# Patient Record
Sex: Female | Born: 1945
Health system: Southern US, Community
[De-identification: ages and names within clinical notes are randomized; demographics above are authoritative.]

## PROBLEM LIST (undated history)

## (undated) DIAGNOSIS — G2581 Restless legs syndrome: Secondary | ICD-10-CM

## (undated) DIAGNOSIS — E663 Overweight: Secondary | ICD-10-CM

## (undated) DIAGNOSIS — A4102 Sepsis due to Methicillin resistant Staphylococcus aureus: Secondary | ICD-10-CM

## (undated) DIAGNOSIS — M797 Fibromyalgia: Secondary | ICD-10-CM

## (undated) DIAGNOSIS — R112 Nausea with vomiting, unspecified: Secondary | ICD-10-CM

## (undated) DIAGNOSIS — K219 Gastro-esophageal reflux disease without esophagitis: Secondary | ICD-10-CM

## (undated) DIAGNOSIS — G4733 Obstructive sleep apnea (adult) (pediatric): Secondary | ICD-10-CM

## (undated) DIAGNOSIS — C187 Malignant neoplasm of sigmoid colon: Secondary | ICD-10-CM

## (undated) DIAGNOSIS — K589 Irritable bowel syndrome without diarrhea: Secondary | ICD-10-CM

## (undated) DIAGNOSIS — E785 Hyperlipidemia, unspecified: Secondary | ICD-10-CM

## (undated) DIAGNOSIS — F32A Depression, unspecified: Secondary | ICD-10-CM

## (undated) DIAGNOSIS — I509 Heart failure, unspecified: Secondary | ICD-10-CM

## (undated) DIAGNOSIS — K859 Acute pancreatitis without necrosis or infection, unspecified: Secondary | ICD-10-CM

## (undated) DIAGNOSIS — J189 Pneumonia, unspecified organism: Secondary | ICD-10-CM

## (undated) DIAGNOSIS — F419 Anxiety disorder, unspecified: Secondary | ICD-10-CM

## (undated) DIAGNOSIS — K649 Unspecified hemorrhoids: Secondary | ICD-10-CM

## (undated) DIAGNOSIS — I10 Essential (primary) hypertension: Secondary | ICD-10-CM

## (undated) DIAGNOSIS — R000 Tachycardia, unspecified: Secondary | ICD-10-CM

## (undated) DIAGNOSIS — M1990 Unspecified osteoarthritis, unspecified site: Secondary | ICD-10-CM

## (undated) DIAGNOSIS — Z9889 Other specified postprocedural states: Secondary | ICD-10-CM

## (undated) DIAGNOSIS — F329 Major depressive disorder, single episode, unspecified: Secondary | ICD-10-CM

## (undated) HISTORY — PX: LEFT OOPHORECTOMY: SHX1961

## (undated) HISTORY — DX: Unspecified osteoarthritis, unspecified site: M19.90

## (undated) HISTORY — DX: Malignant neoplasm of sigmoid colon: C18.7

## (undated) HISTORY — DX: Fibromyalgia: M79.7

## (undated) HISTORY — DX: Depression, unspecified: F32.A

## (undated) HISTORY — DX: Acute pancreatitis without necrosis or infection, unspecified: K85.90

## (undated) HISTORY — DX: Restless legs syndrome: G25.81

## (undated) HISTORY — PX: APPENDECTOMY: SHX54

## (undated) HISTORY — DX: Irritable bowel syndrome, unspecified: K58.9

## (undated) HISTORY — DX: Gastro-esophageal reflux disease without esophagitis: K21.9

## (undated) HISTORY — DX: Essential (primary) hypertension: I10

## (undated) HISTORY — PX: CHOLECYSTECTOMY: SHX55

## (undated) HISTORY — DX: Tachycardia, unspecified: R00.0

## (undated) HISTORY — PX: OTHER SURGICAL HISTORY: SHX169

## (undated) HISTORY — DX: Hyperlipidemia, unspecified: E78.5

## (undated) HISTORY — PX: CERVICAL SPINE SURGERY: SHX589

## (undated) HISTORY — PX: ABDOMINAL HYSTERECTOMY: SHX81

## (undated) HISTORY — DX: Obstructive sleep apnea (adult) (pediatric): G47.33

## (undated) HISTORY — PX: TONSILLECTOMY: SUR1361

## (undated) HISTORY — DX: Overweight: E66.3

## (undated) HISTORY — DX: Sepsis due to methicillin resistant Staphylococcus aureus: A41.02

## (undated) HISTORY — DX: Major depressive disorder, single episode, unspecified: F32.9

## (undated) HISTORY — DX: Unspecified hemorrhoids: K64.9

## (undated) HISTORY — DX: Anxiety disorder, unspecified: F41.9

## (undated) HISTORY — PX: TUBAL LIGATION: SHX77

## (undated) HISTORY — PX: COLON SURGERY: SHX602

## (undated) HISTORY — PX: EYE SURGERY: SHX253

---

## 2010-06-02 HISTORY — PX: JOINT REPLACEMENT: SHX530

## 2010-12-06 ENCOUNTER — Ambulatory Visit: Payer: Self-pay | Admitting: Family Medicine

## 2010-12-24 ENCOUNTER — Other Ambulatory Visit: Payer: Self-pay | Admitting: Orthopedic Surgery

## 2010-12-24 ENCOUNTER — Other Ambulatory Visit (HOSPITAL_COMMUNITY): Payer: Self-pay

## 2010-12-24 ENCOUNTER — Encounter (HOSPITAL_COMMUNITY)
Admission: RE | Admit: 2010-12-24 | Discharge: 2010-12-24 | Disposition: A | Discharge status: Home / Self Care | Payer: Medicare Other | Source: Ambulatory Visit | Attending: Orthopedic Surgery | Admitting: Orthopedic Surgery

## 2010-12-24 LAB — CBC
Hemoglobin: 14.8 g/dL (ref 12.0–15.0)
MCH: 31.3 pg (ref 26.0–34.0)
MCHC: 34.7 g/dL (ref 30.0–36.0)

## 2010-12-24 LAB — DIFFERENTIAL
Basophils Relative: 0 % (ref 0–1)
Eosinophils Absolute: 0.1 10*3/uL (ref 0.0–0.7)
Monocytes Relative: 7 % (ref 3–12)
Neutrophils Relative %: 52 % (ref 43–77)

## 2010-12-24 LAB — COMPREHENSIVE METABOLIC PANEL
Alkaline Phosphatase: 97 U/L (ref 39–117)
BUN: 19 mg/dL (ref 6–23)
Chloride: 101 mEq/L (ref 96–112)
GFR calc Af Amer: 40 mL/min — ABNORMAL LOW (ref >60)
Glucose, Bld: 117 mg/dL — ABNORMAL HIGH (ref 70–99)
Potassium: 4.6 mEq/L (ref 3.5–5.1)
Total Bilirubin: 0.3 mg/dL (ref 0.3–1.2)

## 2010-12-24 LAB — URINALYSIS, ROUTINE W REFLEX MICROSCOPIC
Hgb urine dipstick: NEGATIVE
Specific Gravity, Urine: 1.022 (ref 1.005–1.030)
Urobilinogen, UA: 1 mg/dL (ref 0.0–1.0)
pH: 6 (ref 5.0–8.0)

## 2010-12-24 LAB — URINE MICROSCOPIC-ADD ON

## 2010-12-24 LAB — PROTIME-INR: INR: 0.93 (ref 0.00–1.49)

## 2010-12-24 LAB — SURGICAL PCR SCREEN: MRSA, PCR: NEGATIVE

## 2010-12-25 LAB — URINE CULTURE

## 2010-12-30 ENCOUNTER — Inpatient Hospital Stay (HOSPITAL_COMMUNITY)
Admission: RE | Admit: 2010-12-30 | Discharge: 2011-01-01 | DRG: 470 | Disposition: A | Discharge status: Home Health | Payer: Medicare Other | Source: Ambulatory Visit | Attending: Orthopedic Surgery | Admitting: Orthopedic Surgery

## 2010-12-30 DIAGNOSIS — D6859 Other primary thrombophilia: Secondary | ICD-10-CM | POA: Diagnosis present

## 2010-12-30 DIAGNOSIS — K219 Gastro-esophageal reflux disease without esophagitis: Secondary | ICD-10-CM | POA: Diagnosis present

## 2010-12-30 DIAGNOSIS — F3289 Other specified depressive episodes: Secondary | ICD-10-CM | POA: Diagnosis present

## 2010-12-30 DIAGNOSIS — Z981 Arthrodesis status: Secondary | ICD-10-CM

## 2010-12-30 DIAGNOSIS — Z01812 Encounter for preprocedural laboratory examination: Secondary | ICD-10-CM

## 2010-12-30 DIAGNOSIS — I10 Essential (primary) hypertension: Secondary | ICD-10-CM | POA: Diagnosis present

## 2010-12-30 DIAGNOSIS — Z7982 Long term (current) use of aspirin: Secondary | ICD-10-CM

## 2010-12-30 DIAGNOSIS — M1710 Unilateral primary osteoarthritis, unspecified knee: Principal | ICD-10-CM | POA: Diagnosis present

## 2010-12-30 DIAGNOSIS — IMO0001 Reserved for inherently not codable concepts without codable children: Secondary | ICD-10-CM | POA: Diagnosis present

## 2010-12-30 DIAGNOSIS — D62 Acute posthemorrhagic anemia: Secondary | ICD-10-CM | POA: Diagnosis not present

## 2010-12-30 DIAGNOSIS — F411 Generalized anxiety disorder: Secondary | ICD-10-CM | POA: Diagnosis present

## 2010-12-30 DIAGNOSIS — F329 Major depressive disorder, single episode, unspecified: Secondary | ICD-10-CM | POA: Diagnosis present

## 2010-12-30 LAB — ABO/RH: ABO/RH(D): O POS

## 2010-12-30 LAB — TYPE AND SCREEN: ABO/RH(D): O POS

## 2010-12-31 LAB — BASIC METABOLIC PANEL
CO2: 26 mEq/L (ref 19–32)
Calcium: 8.9 mg/dL (ref 8.4–10.5)
Creatinine, Ser: 0.83 mg/dL (ref 0.50–1.10)
GFR calc Af Amer: >60 mL/min (ref >60)

## 2010-12-31 LAB — PROTIME-INR
INR: 1.06 (ref 0.00–1.49)
Prothrombin Time: 14 seconds (ref 11.6–15.2)

## 2010-12-31 LAB — CBC
MCHC: 34.1 g/dL (ref 30.0–36.0)
RDW: 13 % (ref 11.5–15.5)

## 2011-01-01 LAB — BASIC METABOLIC PANEL
BUN: 14 mg/dL (ref 6–23)
Calcium: 8.8 mg/dL (ref 8.4–10.5)
Creatinine, Ser: 0.84 mg/dL (ref 0.50–1.10)
GFR calc non Af Amer: >60 mL/min (ref >60)
Glucose, Bld: 103 mg/dL — ABNORMAL HIGH (ref 70–99)
Sodium: 138 mEq/L (ref 135–145)

## 2011-01-01 LAB — CBC
MCHC: 33.4 g/dL (ref 30.0–36.0)
MCV: 92.4 fL (ref 78.0–100.0)
Platelets: 227 10*3/uL (ref 150–400)
RDW: 13.4 % (ref 11.5–15.5)
WBC: 10.6 10*3/uL — ABNORMAL HIGH (ref 4.0–10.5)

## 2011-01-12 NOTE — Op Note (Signed)
Angela [name], Angela [name]                 ACCOUNT NO.:  [id number]  MEDICAL RECORD NO.:  [id number]  LOCATION:  2550                         FACILITY:  [facility]  PHYSICIAN:  Fallen Crisostomo A. Thurston Hole, M.D. DATE OF BIRTH:  Oct 06, 1945  DATE OF PROCEDURE:  12/30/2010 DATE OF DISCHARGE:                              OPERATIVE REPORT   PREOPERATIVE DIAGNOSIS:  Right knee degenerative joint disease.  POSTOPERATIVE DIAGNOSIS:  Right knee degenerative joint disease.  PROCEDURE: 1. Right total knee replacement using DePuy cemented total knee system     with number 2.5 cemented femur, #3 cemented tibia with 12.5 mm     polyethylene RP tibial spacer and 32 mm polyethylene cemented     patella. 2. Tobramycin impregnated cement.  SURGEON:  Elana Alm. Thurston Hole, MD  ASSISTANT:  Julien Girt, PA-C  ANESTHESIA:  General.  OPERATIVE TIME:  1 hour and 20 minutes.  COMPLICATIONS:  None.  DESCRIPTION OF PROCEDURE:  Angela [name] was brought to the operating roomon December 30, 2010, after a femoral nerve block was placed in holding area by Anesthesia.  She was placed on operative table in supine position. She received vancomycin 1 g IV preoperatively for prophylaxis.  After being placed under general anesthesia, her right knee was examined.  She had range of motion from -5 to 125 degrees, mild varus deformity, knee stable, ligamentous exam with normal patellar tracking.  She had a Foley catheter placed under sterile conditions.  Her right leg was prepped using sterile DuraPrep and draped using sterile technique.  Time-out procedure was called and correct right knee identified.  The right leg was exsanguinated and a thigh tourniquet elevated at 365 mm.  Initially, through a 15-cm longitudinal incision based over the patella initial exposure was made.  The underlying subcutaneous tissues were incised along with skin incision.  A median arthrotomy was performed revealing an excessive amount of normal-appearing joint  fluid.  The articular surfaces were inspected.  She had grade 4 changes medially, grade 3 and 4 changes laterally and in the patellofemoral joint.  Osteophytes were removed from the femoral condyles and tibial plateau.  The medial lateral meniscal remnants were removed as well as the anterior cruciate ligament.  Intramedullary drill was then drilled up the femoral canal for placement of distal femoral cutting jig, which was placed in the appropriate manner rotation and a distal 10-mm cut was made.  The distal femur was incised.  A #2.5 was found to be the appropriate size.  #2.5 cutting jig was placed in appropriate manner external rotation and then these cuts were made.  The proximal tibia was exposed.  The tibial spines were removed with an oscillating saw.  Intramedullary drill was drilled down the tibial canal for placement of proximal tibial cutting jig which was placed in appropriate manner rotation and a proximal 6-mm cut was made based off the medial or lower side.  Spacer blocks were then placed in flexion and extension.  A 12.5 mm blocks gave excellent balancing, excellent stability and excellent correction of her flexion and varus deformities.  A #3 tibial baseplate trial was then placed on the cut tibial surface with an excellent fit and the keel  cut was made. The PCL box cutter was then placed on the distal femur and these cuts were made.  At this point, the #2.5 femoral trial was placed and with a #3 tibial baseplate trial and then a 12.5-mm polyethylene RP tibial spacer, knee was reduced, taken through range of motion from 0-125 degrees with excellent stability and excellent correction of her flexion and varus deformities and normal patellar tracking.  A resurfacing 8.5- mm cut was then made on the patella and three locking holes placed for a 32-mm polyethylene patella trial and again patellofemoral tracking was evaluated and found to be normal.  At this point, felt that all  trial components were of excellent size and fit stability.  They were then removed.  The knee was then jet lavage irrigated with 3 liters of saline.  The proximal tibia was then exposed.  #3 tibial baseplate with tobramycin impregnated cement was then hammered into position with an excellent fit with excess cement being removed from around the edges.  A #2.5 femoral component with cement backing was hammered into position also with an excellent fit with excess cement being removed from around the edges.  A 12.5-mm polyethylene RP tibial spacer was placed on tibial baseplate.  The knee reduced, taken through range of motion from 0-125 degrees with excellent stability and excellent correction of her flexion and varus deformities.  The 32-mm polyethylene cement backed patella was then placed in its position and held there with a clamp.  After the cement hardened again patellofemoral tracking was evaluated and found to be normal.  At this point, felt that all components were of excellent size and fit stability.  The wound was further irrigated with saline. Tourniquet was released.  Hemostasis obtained with cautery.  The arthrotomy was then closed with #1 Ethilon suture over two medium Hemovac drains.  Subcutaneous tissues closed with 0 and 2-0 Vicryl subcuticular layer closed with 4-0 Monocryl.  Sterile dressings were applied and a long-leg splint and the patient awakened, extubated and taken to recovery room in stable condition.  Needle, sponge counts correct x2 at the end the case.  Neurovascular status normal.  Pulses 2+ and symmetric.     Claborn Janusz A. Thurston Hole, M.D.     RAW/MEDQ  D:  12/30/2010  T:  12/30/2010  Job:  [id number]  Electronically Signed by Salvatore Marvel M.D. on 01/12/2011 02:59:07 PM

## 2011-06-11 DIAGNOSIS — IMO0001 Reserved for inherently not codable concepts without codable children: Secondary | ICD-10-CM | POA: Diagnosis not present

## 2011-06-11 DIAGNOSIS — G2581 Restless legs syndrome: Secondary | ICD-10-CM | POA: Diagnosis not present

## 2011-06-11 DIAGNOSIS — J309 Allergic rhinitis, unspecified: Secondary | ICD-10-CM | POA: Diagnosis not present

## 2011-06-11 DIAGNOSIS — Z79899 Other long term (current) drug therapy: Secondary | ICD-10-CM | POA: Diagnosis not present

## 2011-07-10 DIAGNOSIS — H04129 Dry eye syndrome of unspecified lacrimal gland: Secondary | ICD-10-CM | POA: Diagnosis not present

## 2011-07-15 DIAGNOSIS — N952 Postmenopausal atrophic vaginitis: Secondary | ICD-10-CM | POA: Diagnosis not present

## 2011-07-15 DIAGNOSIS — R32 Unspecified urinary incontinence: Secondary | ICD-10-CM | POA: Diagnosis not present

## 2011-07-15 DIAGNOSIS — R6882 Decreased libido: Secondary | ICD-10-CM | POA: Diagnosis not present

## 2011-07-15 DIAGNOSIS — N951 Menopausal and female climacteric states: Secondary | ICD-10-CM | POA: Diagnosis not present

## 2011-07-23 DIAGNOSIS — I10 Essential (primary) hypertension: Secondary | ICD-10-CM | POA: Diagnosis not present

## 2011-07-23 DIAGNOSIS — F339 Major depressive disorder, recurrent, unspecified: Secondary | ICD-10-CM | POA: Diagnosis not present

## 2011-07-23 DIAGNOSIS — IMO0001 Reserved for inherently not codable concepts without codable children: Secondary | ICD-10-CM | POA: Diagnosis not present

## 2011-07-24 DIAGNOSIS — M1710 Unilateral primary osteoarthritis, unspecified knee: Secondary | ICD-10-CM | POA: Diagnosis not present

## 2011-08-13 DIAGNOSIS — I10 Essential (primary) hypertension: Secondary | ICD-10-CM | POA: Diagnosis not present

## 2011-08-13 DIAGNOSIS — IMO0001 Reserved for inherently not codable concepts without codable children: Secondary | ICD-10-CM | POA: Diagnosis not present

## 2011-08-13 DIAGNOSIS — R238 Other skin changes: Secondary | ICD-10-CM | POA: Diagnosis not present

## 2011-08-13 DIAGNOSIS — F339 Major depressive disorder, recurrent, unspecified: Secondary | ICD-10-CM | POA: Diagnosis not present

## 2011-09-12 DIAGNOSIS — IMO0001 Reserved for inherently not codable concepts without codable children: Secondary | ICD-10-CM | POA: Diagnosis not present

## 2011-10-20 DIAGNOSIS — IMO0001 Reserved for inherently not codable concepts without codable children: Secondary | ICD-10-CM | POA: Diagnosis not present

## 2011-10-20 DIAGNOSIS — F339 Major depressive disorder, recurrent, unspecified: Secondary | ICD-10-CM | POA: Diagnosis not present

## 2011-11-01 DIAGNOSIS — Z1231 Encounter for screening mammogram for malignant neoplasm of breast: Secondary | ICD-10-CM | POA: Diagnosis not present

## 2011-11-10 DIAGNOSIS — Z1231 Encounter for screening mammogram for malignant neoplasm of breast: Secondary | ICD-10-CM | POA: Diagnosis not present

## 2011-11-19 DIAGNOSIS — H04129 Dry eye syndrome of unspecified lacrimal gland: Secondary | ICD-10-CM | POA: Diagnosis not present

## 2011-12-08 DIAGNOSIS — H43819 Vitreous degeneration, unspecified eye: Secondary | ICD-10-CM | POA: Diagnosis not present

## 2012-02-16 DIAGNOSIS — F339 Major depressive disorder, recurrent, unspecified: Secondary | ICD-10-CM | POA: Diagnosis not present

## 2012-02-16 DIAGNOSIS — IMO0001 Reserved for inherently not codable concepts without codable children: Secondary | ICD-10-CM | POA: Diagnosis not present

## 2012-02-16 DIAGNOSIS — E785 Hyperlipidemia, unspecified: Secondary | ICD-10-CM | POA: Diagnosis not present

## 2012-02-16 DIAGNOSIS — Z23 Encounter for immunization: Secondary | ICD-10-CM | POA: Diagnosis not present

## 2012-02-16 DIAGNOSIS — I10 Essential (primary) hypertension: Secondary | ICD-10-CM | POA: Diagnosis not present

## 2012-06-11 DIAGNOSIS — I10 Essential (primary) hypertension: Secondary | ICD-10-CM | POA: Diagnosis not present

## 2012-06-11 DIAGNOSIS — IMO0002 Reserved for concepts with insufficient information to code with codable children: Secondary | ICD-10-CM | POA: Diagnosis not present

## 2012-06-11 DIAGNOSIS — Z1211 Encounter for screening for malignant neoplasm of colon: Secondary | ICD-10-CM | POA: Diagnosis not present

## 2012-06-11 DIAGNOSIS — M1990 Unspecified osteoarthritis, unspecified site: Secondary | ICD-10-CM | POA: Diagnosis not present

## 2012-06-11 DIAGNOSIS — IMO0001 Reserved for inherently not codable concepts without codable children: Secondary | ICD-10-CM | POA: Diagnosis not present

## 2012-07-08 DIAGNOSIS — M47812 Spondylosis without myelopathy or radiculopathy, cervical region: Secondary | ICD-10-CM | POA: Diagnosis not present

## 2012-07-08 DIAGNOSIS — M5030 Other cervical disc degeneration, unspecified cervical region: Secondary | ICD-10-CM | POA: Diagnosis not present

## 2012-07-08 DIAGNOSIS — IMO0001 Reserved for inherently not codable concepts without codable children: Secondary | ICD-10-CM | POA: Diagnosis not present

## 2012-07-08 DIAGNOSIS — Z79899 Other long term (current) drug therapy: Secondary | ICD-10-CM | POA: Diagnosis not present

## 2012-07-08 DIAGNOSIS — G894 Chronic pain syndrome: Secondary | ICD-10-CM | POA: Diagnosis not present

## 2012-07-12 ENCOUNTER — Other Ambulatory Visit: Payer: Self-pay | Admitting: Pain Medicine

## 2012-07-12 DIAGNOSIS — M542 Cervicalgia: Secondary | ICD-10-CM

## 2012-07-17 ENCOUNTER — Inpatient Hospital Stay: Admission: RE | Admit: 2012-07-17 | Payer: Medicare Other | Source: Ambulatory Visit

## 2012-07-23 ENCOUNTER — Ambulatory Visit
Admission: RE | Admit: 2012-07-23 | Discharge: 2012-07-23 | Disposition: A | Discharge status: Home / Self Care | Payer: Medicare Other | Source: Ambulatory Visit | Attending: Pain Medicine | Admitting: Pain Medicine

## 2012-07-23 DIAGNOSIS — M47812 Spondylosis without myelopathy or radiculopathy, cervical region: Secondary | ICD-10-CM | POA: Diagnosis not present

## 2012-07-23 DIAGNOSIS — M542 Cervicalgia: Secondary | ICD-10-CM

## 2012-07-29 DIAGNOSIS — G894 Chronic pain syndrome: Secondary | ICD-10-CM | POA: Diagnosis not present

## 2012-07-29 DIAGNOSIS — M5030 Other cervical disc degeneration, unspecified cervical region: Secondary | ICD-10-CM | POA: Diagnosis not present

## 2012-07-29 DIAGNOSIS — M47812 Spondylosis without myelopathy or radiculopathy, cervical region: Secondary | ICD-10-CM | POA: Diagnosis not present

## 2012-07-29 DIAGNOSIS — IMO0001 Reserved for inherently not codable concepts without codable children: Secondary | ICD-10-CM | POA: Diagnosis not present

## 2012-08-20 DIAGNOSIS — I10 Essential (primary) hypertension: Secondary | ICD-10-CM | POA: Diagnosis not present

## 2012-08-20 DIAGNOSIS — IMO0001 Reserved for inherently not codable concepts without codable children: Secondary | ICD-10-CM | POA: Diagnosis not present

## 2012-08-25 DIAGNOSIS — Z09 Encounter for follow-up examination after completed treatment for conditions other than malignant neoplasm: Secondary | ICD-10-CM | POA: Diagnosis not present

## 2012-08-25 DIAGNOSIS — Z85038 Personal history of other malignant neoplasm of large intestine: Secondary | ICD-10-CM | POA: Diagnosis not present

## 2012-09-01 DIAGNOSIS — H04129 Dry eye syndrome of unspecified lacrimal gland: Secondary | ICD-10-CM | POA: Diagnosis not present

## 2012-09-01 DIAGNOSIS — H43819 Vitreous degeneration, unspecified eye: Secondary | ICD-10-CM | POA: Diagnosis not present

## 2012-09-02 DIAGNOSIS — R209 Unspecified disturbances of skin sensation: Secondary | ICD-10-CM | POA: Diagnosis not present

## 2012-09-02 DIAGNOSIS — M542 Cervicalgia: Secondary | ICD-10-CM | POA: Diagnosis not present

## 2012-09-02 DIAGNOSIS — M5030 Other cervical disc degeneration, unspecified cervical region: Secondary | ICD-10-CM | POA: Diagnosis not present

## 2012-09-02 DIAGNOSIS — M47812 Spondylosis without myelopathy or radiculopathy, cervical region: Secondary | ICD-10-CM | POA: Diagnosis not present

## 2012-09-07 DIAGNOSIS — M79609 Pain in unspecified limb: Secondary | ICD-10-CM | POA: Diagnosis not present

## 2012-09-07 DIAGNOSIS — G894 Chronic pain syndrome: Secondary | ICD-10-CM | POA: Diagnosis not present

## 2012-09-07 DIAGNOSIS — M47812 Spondylosis without myelopathy or radiculopathy, cervical region: Secondary | ICD-10-CM | POA: Diagnosis not present

## 2012-09-07 DIAGNOSIS — Z79899 Other long term (current) drug therapy: Secondary | ICD-10-CM | POA: Diagnosis not present

## 2012-09-07 DIAGNOSIS — M5030 Other cervical disc degeneration, unspecified cervical region: Secondary | ICD-10-CM | POA: Diagnosis not present

## 2012-09-21 DIAGNOSIS — M5030 Other cervical disc degeneration, unspecified cervical region: Secondary | ICD-10-CM | POA: Diagnosis not present

## 2012-10-21 DIAGNOSIS — M461 Sacroiliitis, not elsewhere classified: Secondary | ICD-10-CM | POA: Diagnosis not present

## 2012-11-09 DIAGNOSIS — M5030 Other cervical disc degeneration, unspecified cervical region: Secondary | ICD-10-CM | POA: Diagnosis not present

## 2012-11-09 DIAGNOSIS — Z79899 Other long term (current) drug therapy: Secondary | ICD-10-CM | POA: Diagnosis not present

## 2012-12-15 ENCOUNTER — Other Ambulatory Visit: Payer: Self-pay | Admitting: Pain Medicine

## 2012-12-15 ENCOUNTER — Ambulatory Visit
Admission: RE | Admit: 2012-12-15 | Discharge: 2012-12-15 | Disposition: A | Discharge status: Home / Self Care | Payer: Medicare Other | Source: Ambulatory Visit | Attending: Pain Medicine | Admitting: Pain Medicine

## 2012-12-15 DIAGNOSIS — M47817 Spondylosis without myelopathy or radiculopathy, lumbosacral region: Secondary | ICD-10-CM | POA: Diagnosis not present

## 2012-12-15 DIAGNOSIS — M7918 Myalgia, other site: Secondary | ICD-10-CM

## 2012-12-15 DIAGNOSIS — M25552 Pain in left hip: Secondary | ICD-10-CM

## 2012-12-21 DIAGNOSIS — M47812 Spondylosis without myelopathy or radiculopathy, cervical region: Secondary | ICD-10-CM | POA: Diagnosis not present

## 2012-12-21 DIAGNOSIS — M5030 Other cervical disc degeneration, unspecified cervical region: Secondary | ICD-10-CM | POA: Diagnosis not present

## 2012-12-21 DIAGNOSIS — M79609 Pain in unspecified limb: Secondary | ICD-10-CM | POA: Diagnosis not present

## 2012-12-21 DIAGNOSIS — Z79899 Other long term (current) drug therapy: Secondary | ICD-10-CM | POA: Diagnosis not present

## 2012-12-21 DIAGNOSIS — G894 Chronic pain syndrome: Secondary | ICD-10-CM | POA: Diagnosis not present

## 2012-12-23 DIAGNOSIS — M5030 Other cervical disc degeneration, unspecified cervical region: Secondary | ICD-10-CM | POA: Diagnosis not present

## 2012-12-29 DIAGNOSIS — Z1231 Encounter for screening mammogram for malignant neoplasm of breast: Secondary | ICD-10-CM | POA: Diagnosis not present

## 2013-02-05 DIAGNOSIS — Z980 Intestinal bypass and anastomosis status: Secondary | ICD-10-CM | POA: Diagnosis not present

## 2013-02-05 DIAGNOSIS — I498 Other specified cardiac arrhythmias: Secondary | ICD-10-CM | POA: Diagnosis not present

## 2013-02-05 DIAGNOSIS — I079 Rheumatic tricuspid valve disease, unspecified: Secondary | ICD-10-CM | POA: Diagnosis not present

## 2013-02-05 DIAGNOSIS — E860 Dehydration: Secondary | ICD-10-CM | POA: Diagnosis not present

## 2013-02-05 DIAGNOSIS — K5289 Other specified noninfective gastroenteritis and colitis: Secondary | ICD-10-CM | POA: Diagnosis not present

## 2013-02-05 DIAGNOSIS — K529 Noninfective gastroenteritis and colitis, unspecified: Secondary | ICD-10-CM | POA: Diagnosis not present

## 2013-02-05 DIAGNOSIS — R1013 Epigastric pain: Secondary | ICD-10-CM | POA: Diagnosis not present

## 2013-02-05 DIAGNOSIS — R1084 Generalized abdominal pain: Secondary | ICD-10-CM | POA: Diagnosis not present

## 2013-02-05 DIAGNOSIS — Z9889 Other specified postprocedural states: Secondary | ICD-10-CM | POA: Diagnosis not present

## 2013-02-05 DIAGNOSIS — Z85038 Personal history of other malignant neoplasm of large intestine: Secondary | ICD-10-CM | POA: Diagnosis not present

## 2013-02-05 DIAGNOSIS — R1110 Vomiting, unspecified: Secondary | ICD-10-CM | POA: Diagnosis not present

## 2013-02-05 DIAGNOSIS — R0789 Other chest pain: Secondary | ICD-10-CM | POA: Diagnosis not present

## 2013-02-05 DIAGNOSIS — M1990 Unspecified osteoarthritis, unspecified site: Secondary | ICD-10-CM | POA: Diagnosis not present

## 2013-02-05 DIAGNOSIS — K219 Gastro-esophageal reflux disease without esophagitis: Secondary | ICD-10-CM | POA: Diagnosis not present

## 2013-02-05 DIAGNOSIS — I059 Rheumatic mitral valve disease, unspecified: Secondary | ICD-10-CM | POA: Diagnosis not present

## 2013-02-05 DIAGNOSIS — R197 Diarrhea, unspecified: Secondary | ICD-10-CM | POA: Diagnosis not present

## 2013-02-05 DIAGNOSIS — Z90710 Acquired absence of both cervix and uterus: Secondary | ICD-10-CM | POA: Diagnosis not present

## 2013-02-05 DIAGNOSIS — E785 Hyperlipidemia, unspecified: Secondary | ICD-10-CM | POA: Diagnosis not present

## 2013-02-05 DIAGNOSIS — R079 Chest pain, unspecified: Secondary | ICD-10-CM | POA: Diagnosis not present

## 2013-02-05 DIAGNOSIS — R911 Solitary pulmonary nodule: Secondary | ICD-10-CM | POA: Diagnosis not present

## 2013-02-05 DIAGNOSIS — R112 Nausea with vomiting, unspecified: Secondary | ICD-10-CM | POA: Diagnosis not present

## 2013-02-06 DIAGNOSIS — R1013 Epigastric pain: Secondary | ICD-10-CM | POA: Diagnosis not present

## 2013-02-06 DIAGNOSIS — K529 Noninfective gastroenteritis and colitis, unspecified: Secondary | ICD-10-CM | POA: Diagnosis not present

## 2013-02-06 DIAGNOSIS — E860 Dehydration: Secondary | ICD-10-CM | POA: Diagnosis not present

## 2013-02-06 DIAGNOSIS — I10 Essential (primary) hypertension: Secondary | ICD-10-CM | POA: Diagnosis not present

## 2013-02-07 DIAGNOSIS — K529 Noninfective gastroenteritis and colitis, unspecified: Secondary | ICD-10-CM | POA: Diagnosis not present

## 2013-02-07 DIAGNOSIS — I059 Rheumatic mitral valve disease, unspecified: Secondary | ICD-10-CM | POA: Diagnosis not present

## 2013-02-07 DIAGNOSIS — I359 Nonrheumatic aortic valve disorder, unspecified: Secondary | ICD-10-CM | POA: Diagnosis not present

## 2013-02-07 DIAGNOSIS — R1013 Epigastric pain: Secondary | ICD-10-CM | POA: Diagnosis not present

## 2013-02-07 DIAGNOSIS — I369 Nonrheumatic tricuspid valve disorder, unspecified: Secondary | ICD-10-CM | POA: Diagnosis not present

## 2013-02-07 DIAGNOSIS — I10 Essential (primary) hypertension: Secondary | ICD-10-CM | POA: Diagnosis not present

## 2013-02-07 DIAGNOSIS — E860 Dehydration: Secondary | ICD-10-CM | POA: Diagnosis not present

## 2013-02-08 DIAGNOSIS — E860 Dehydration: Secondary | ICD-10-CM | POA: Diagnosis not present

## 2013-02-08 DIAGNOSIS — R1013 Epigastric pain: Secondary | ICD-10-CM | POA: Diagnosis not present

## 2013-02-08 DIAGNOSIS — K529 Noninfective gastroenteritis and colitis, unspecified: Secondary | ICD-10-CM | POA: Diagnosis not present

## 2013-02-08 DIAGNOSIS — I10 Essential (primary) hypertension: Secondary | ICD-10-CM | POA: Diagnosis not present

## 2013-02-14 DIAGNOSIS — M25519 Pain in unspecified shoulder: Secondary | ICD-10-CM | POA: Diagnosis not present

## 2013-02-14 DIAGNOSIS — I10 Essential (primary) hypertension: Secondary | ICD-10-CM | POA: Diagnosis not present

## 2013-02-14 DIAGNOSIS — G2581 Restless legs syndrome: Secondary | ICD-10-CM | POA: Diagnosis not present

## 2013-02-14 DIAGNOSIS — Z09 Encounter for follow-up examination after completed treatment for conditions other than malignant neoplasm: Secondary | ICD-10-CM | POA: Diagnosis not present

## 2013-02-16 DIAGNOSIS — M25519 Pain in unspecified shoulder: Secondary | ICD-10-CM | POA: Diagnosis not present

## 2013-02-16 DIAGNOSIS — M1710 Unilateral primary osteoarthritis, unspecified knee: Secondary | ICD-10-CM | POA: Diagnosis not present

## 2013-03-08 DIAGNOSIS — K7689 Other specified diseases of liver: Secondary | ICD-10-CM | POA: Diagnosis not present

## 2013-03-08 DIAGNOSIS — Z09 Encounter for follow-up examination after completed treatment for conditions other than malignant neoplasm: Secondary | ICD-10-CM | POA: Diagnosis not present

## 2013-03-10 DIAGNOSIS — R079 Chest pain, unspecified: Secondary | ICD-10-CM | POA: Diagnosis not present

## 2013-03-10 DIAGNOSIS — R0789 Other chest pain: Secondary | ICD-10-CM | POA: Diagnosis not present

## 2013-03-15 DIAGNOSIS — M5030 Other cervical disc degeneration, unspecified cervical region: Secondary | ICD-10-CM | POA: Diagnosis not present

## 2013-03-15 DIAGNOSIS — Z79899 Other long term (current) drug therapy: Secondary | ICD-10-CM | POA: Diagnosis not present

## 2013-04-13 DIAGNOSIS — M5030 Other cervical disc degeneration, unspecified cervical region: Secondary | ICD-10-CM | POA: Diagnosis not present

## 2013-04-13 DIAGNOSIS — G894 Chronic pain syndrome: Secondary | ICD-10-CM | POA: Diagnosis not present

## 2013-04-13 DIAGNOSIS — M5137 Other intervertebral disc degeneration, lumbosacral region: Secondary | ICD-10-CM | POA: Diagnosis not present

## 2013-04-13 DIAGNOSIS — Z79899 Other long term (current) drug therapy: Secondary | ICD-10-CM | POA: Diagnosis not present

## 2013-04-13 DIAGNOSIS — M461 Sacroiliitis, not elsewhere classified: Secondary | ICD-10-CM | POA: Diagnosis not present

## 2013-05-12 DIAGNOSIS — M5030 Other cervical disc degeneration, unspecified cervical region: Secondary | ICD-10-CM | POA: Diagnosis not present

## 2013-05-29 DIAGNOSIS — R05 Cough: Secondary | ICD-10-CM | POA: Diagnosis not present

## 2013-05-29 DIAGNOSIS — R059 Cough, unspecified: Secondary | ICD-10-CM | POA: Diagnosis not present

## 2013-05-29 DIAGNOSIS — J019 Acute sinusitis, unspecified: Secondary | ICD-10-CM | POA: Diagnosis not present

## 2013-06-14 DIAGNOSIS — IMO0001 Reserved for inherently not codable concepts without codable children: Secondary | ICD-10-CM | POA: Diagnosis not present

## 2013-06-14 DIAGNOSIS — G894 Chronic pain syndrome: Secondary | ICD-10-CM | POA: Diagnosis not present

## 2013-06-14 DIAGNOSIS — M5030 Other cervical disc degeneration, unspecified cervical region: Secondary | ICD-10-CM | POA: Diagnosis not present

## 2013-06-14 DIAGNOSIS — Z79899 Other long term (current) drug therapy: Secondary | ICD-10-CM | POA: Diagnosis not present

## 2013-07-21 DIAGNOSIS — M5030 Other cervical disc degeneration, unspecified cervical region: Secondary | ICD-10-CM | POA: Diagnosis not present

## 2013-09-13 DIAGNOSIS — Z8659 Personal history of other mental and behavioral disorders: Secondary | ICD-10-CM | POA: Diagnosis not present

## 2013-09-13 DIAGNOSIS — G2581 Restless legs syndrome: Secondary | ICD-10-CM | POA: Diagnosis not present

## 2013-09-13 DIAGNOSIS — R911 Solitary pulmonary nodule: Secondary | ICD-10-CM | POA: Diagnosis not present

## 2013-09-13 DIAGNOSIS — I10 Essential (primary) hypertension: Secondary | ICD-10-CM | POA: Diagnosis not present

## 2013-09-13 DIAGNOSIS — E785 Hyperlipidemia, unspecified: Secondary | ICD-10-CM | POA: Diagnosis not present

## 2013-09-13 DIAGNOSIS — L989 Disorder of the skin and subcutaneous tissue, unspecified: Secondary | ICD-10-CM | POA: Diagnosis not present

## 2013-09-15 DIAGNOSIS — IMO0001 Reserved for inherently not codable concepts without codable children: Secondary | ICD-10-CM | POA: Diagnosis not present

## 2013-09-15 DIAGNOSIS — IMO0002 Reserved for concepts with insufficient information to code with codable children: Secondary | ICD-10-CM | POA: Diagnosis not present

## 2013-09-15 DIAGNOSIS — G894 Chronic pain syndrome: Secondary | ICD-10-CM | POA: Diagnosis not present

## 2013-09-15 DIAGNOSIS — M5137 Other intervertebral disc degeneration, lumbosacral region: Secondary | ICD-10-CM | POA: Diagnosis not present

## 2013-09-29 DIAGNOSIS — Z79899 Other long term (current) drug therapy: Secondary | ICD-10-CM | POA: Diagnosis not present

## 2013-09-29 DIAGNOSIS — G894 Chronic pain syndrome: Secondary | ICD-10-CM | POA: Diagnosis not present

## 2013-09-29 DIAGNOSIS — M5030 Other cervical disc degeneration, unspecified cervical region: Secondary | ICD-10-CM | POA: Diagnosis not present

## 2013-09-30 DIAGNOSIS — R911 Solitary pulmonary nodule: Secondary | ICD-10-CM | POA: Diagnosis not present

## 2013-10-04 DIAGNOSIS — E785 Hyperlipidemia, unspecified: Secondary | ICD-10-CM | POA: Diagnosis not present

## 2013-10-13 DIAGNOSIS — L905 Scar conditions and fibrosis of skin: Secondary | ICD-10-CM | POA: Diagnosis not present

## 2013-10-13 DIAGNOSIS — L820 Inflamed seborrheic keratosis: Secondary | ICD-10-CM | POA: Diagnosis not present

## 2013-10-13 DIAGNOSIS — D235 Other benign neoplasm of skin of trunk: Secondary | ICD-10-CM | POA: Diagnosis not present

## 2013-10-14 DIAGNOSIS — G894 Chronic pain syndrome: Secondary | ICD-10-CM | POA: Diagnosis not present

## 2013-10-14 DIAGNOSIS — IMO0001 Reserved for inherently not codable concepts without codable children: Secondary | ICD-10-CM | POA: Diagnosis not present

## 2013-10-14 DIAGNOSIS — M5030 Other cervical disc degeneration, unspecified cervical region: Secondary | ICD-10-CM | POA: Diagnosis not present

## 2013-10-14 DIAGNOSIS — Z79899 Other long term (current) drug therapy: Secondary | ICD-10-CM | POA: Diagnosis not present

## 2013-11-17 DIAGNOSIS — M545 Low back pain, unspecified: Secondary | ICD-10-CM | POA: Diagnosis not present

## 2013-11-17 DIAGNOSIS — M25559 Pain in unspecified hip: Secondary | ICD-10-CM | POA: Diagnosis not present

## 2013-11-28 DIAGNOSIS — L259 Unspecified contact dermatitis, unspecified cause: Secondary | ICD-10-CM | POA: Diagnosis not present

## 2013-12-06 DIAGNOSIS — Z79899 Other long term (current) drug therapy: Secondary | ICD-10-CM | POA: Diagnosis not present

## 2013-12-06 DIAGNOSIS — IMO0001 Reserved for inherently not codable concepts without codable children: Secondary | ICD-10-CM | POA: Diagnosis not present

## 2013-12-06 DIAGNOSIS — G894 Chronic pain syndrome: Secondary | ICD-10-CM | POA: Diagnosis not present

## 2013-12-06 DIAGNOSIS — M1990 Unspecified osteoarthritis, unspecified site: Secondary | ICD-10-CM | POA: Diagnosis not present

## 2014-02-01 DIAGNOSIS — M531 Cervicobrachial syndrome: Secondary | ICD-10-CM | POA: Diagnosis not present

## 2014-02-01 DIAGNOSIS — Z79899 Other long term (current) drug therapy: Secondary | ICD-10-CM | POA: Diagnosis not present

## 2014-02-01 DIAGNOSIS — M1990 Unspecified osteoarthritis, unspecified site: Secondary | ICD-10-CM | POA: Diagnosis not present

## 2014-02-01 DIAGNOSIS — G894 Chronic pain syndrome: Secondary | ICD-10-CM | POA: Diagnosis not present

## 2014-02-13 DIAGNOSIS — Z1231 Encounter for screening mammogram for malignant neoplasm of breast: Secondary | ICD-10-CM | POA: Diagnosis not present

## 2014-02-15 DIAGNOSIS — M7700 Medial epicondylitis, unspecified elbow: Secondary | ICD-10-CM | POA: Diagnosis not present

## 2014-03-17 DIAGNOSIS — H43813 Vitreous degeneration, bilateral: Secondary | ICD-10-CM | POA: Diagnosis not present

## 2014-03-17 DIAGNOSIS — H259 Unspecified age-related cataract: Secondary | ICD-10-CM | POA: Diagnosis not present

## 2014-03-17 DIAGNOSIS — H40053 Ocular hypertension, bilateral: Secondary | ICD-10-CM | POA: Diagnosis not present

## 2014-03-22 DIAGNOSIS — F329 Major depressive disorder, single episode, unspecified: Secondary | ICD-10-CM | POA: Diagnosis present

## 2014-03-22 DIAGNOSIS — F411 Generalized anxiety disorder: Secondary | ICD-10-CM | POA: Diagnosis present

## 2014-03-22 DIAGNOSIS — R1110 Vomiting, unspecified: Secondary | ICD-10-CM | POA: Diagnosis not present

## 2014-03-22 DIAGNOSIS — Z23 Encounter for immunization: Secondary | ICD-10-CM | POA: Diagnosis not present

## 2014-03-22 DIAGNOSIS — R109 Unspecified abdominal pain: Secondary | ICD-10-CM | POA: Diagnosis not present

## 2014-03-22 DIAGNOSIS — M797 Fibromyalgia: Secondary | ICD-10-CM | POA: Diagnosis present

## 2014-03-22 DIAGNOSIS — I10 Essential (primary) hypertension: Secondary | ICD-10-CM | POA: Diagnosis not present

## 2014-03-22 DIAGNOSIS — R079 Chest pain, unspecified: Secondary | ICD-10-CM | POA: Diagnosis not present

## 2014-03-22 DIAGNOSIS — G2581 Restless legs syndrome: Secondary | ICD-10-CM | POA: Diagnosis present

## 2014-03-22 DIAGNOSIS — K858 Other acute pancreatitis: Secondary | ICD-10-CM | POA: Diagnosis not present

## 2014-03-22 DIAGNOSIS — R1013 Epigastric pain: Secondary | ICD-10-CM | POA: Diagnosis not present

## 2014-03-22 DIAGNOSIS — K529 Noninfective gastroenteritis and colitis, unspecified: Secondary | ICD-10-CM | POA: Diagnosis present

## 2014-03-22 DIAGNOSIS — R911 Solitary pulmonary nodule: Secondary | ICD-10-CM | POA: Diagnosis not present

## 2014-03-22 DIAGNOSIS — J9811 Atelectasis: Secondary | ICD-10-CM | POA: Diagnosis not present

## 2014-03-22 DIAGNOSIS — K219 Gastro-esophageal reflux disease without esophagitis: Secondary | ICD-10-CM | POA: Diagnosis present

## 2014-03-22 DIAGNOSIS — K859 Acute pancreatitis, unspecified: Secondary | ICD-10-CM | POA: Diagnosis not present

## 2014-03-22 DIAGNOSIS — R112 Nausea with vomiting, unspecified: Secondary | ICD-10-CM | POA: Diagnosis not present

## 2014-03-30 DIAGNOSIS — M1990 Unspecified osteoarthritis, unspecified site: Secondary | ICD-10-CM | POA: Diagnosis not present

## 2014-03-30 DIAGNOSIS — M5412 Radiculopathy, cervical region: Secondary | ICD-10-CM | POA: Diagnosis not present

## 2014-03-30 DIAGNOSIS — G894 Chronic pain syndrome: Secondary | ICD-10-CM | POA: Diagnosis not present

## 2014-03-30 DIAGNOSIS — M5030 Other cervical disc degeneration, unspecified cervical region: Secondary | ICD-10-CM | POA: Diagnosis not present

## 2014-03-30 DIAGNOSIS — M47817 Spondylosis without myelopathy or radiculopathy, lumbosacral region: Secondary | ICD-10-CM | POA: Diagnosis not present

## 2014-03-30 DIAGNOSIS — M5382 Other specified dorsopathies, cervical region: Secondary | ICD-10-CM | POA: Diagnosis not present

## 2014-03-30 DIAGNOSIS — M79609 Pain in unspecified limb: Secondary | ICD-10-CM | POA: Diagnosis not present

## 2014-03-30 DIAGNOSIS — M797 Fibromyalgia: Secondary | ICD-10-CM | POA: Diagnosis not present

## 2014-05-01 DIAGNOSIS — Z0000 Encounter for general adult medical examination without abnormal findings: Secondary | ICD-10-CM | POA: Diagnosis not present

## 2014-05-01 DIAGNOSIS — E785 Hyperlipidemia, unspecified: Secondary | ICD-10-CM | POA: Diagnosis not present

## 2014-05-08 DIAGNOSIS — Z8719 Personal history of other diseases of the digestive system: Secondary | ICD-10-CM | POA: Diagnosis not present

## 2014-05-08 DIAGNOSIS — R109 Unspecified abdominal pain: Secondary | ICD-10-CM | POA: Diagnosis not present

## 2014-05-08 DIAGNOSIS — I10 Essential (primary) hypertension: Secondary | ICD-10-CM | POA: Diagnosis not present

## 2014-05-08 DIAGNOSIS — N951 Menopausal and female climacteric states: Secondary | ICD-10-CM | POA: Diagnosis not present

## 2014-05-08 DIAGNOSIS — F329 Major depressive disorder, single episode, unspecified: Secondary | ICD-10-CM | POA: Diagnosis not present

## 2014-05-08 DIAGNOSIS — Z0000 Encounter for general adult medical examination without abnormal findings: Secondary | ICD-10-CM | POA: Diagnosis not present

## 2014-05-12 DIAGNOSIS — M79609 Pain in unspecified limb: Secondary | ICD-10-CM | POA: Diagnosis not present

## 2014-05-12 DIAGNOSIS — M25519 Pain in unspecified shoulder: Secondary | ICD-10-CM | POA: Diagnosis not present

## 2014-05-12 DIAGNOSIS — M179 Osteoarthritis of knee, unspecified: Secondary | ICD-10-CM | POA: Diagnosis not present

## 2014-05-12 DIAGNOSIS — M1990 Unspecified osteoarthritis, unspecified site: Secondary | ICD-10-CM | POA: Diagnosis not present

## 2014-05-12 DIAGNOSIS — M79601 Pain in right arm: Secondary | ICD-10-CM | POA: Diagnosis not present

## 2014-05-12 DIAGNOSIS — G894 Chronic pain syndrome: Secondary | ICD-10-CM | POA: Diagnosis not present

## 2014-05-12 DIAGNOSIS — M5030 Other cervical disc degeneration, unspecified cervical region: Secondary | ICD-10-CM | POA: Diagnosis not present

## 2014-05-12 DIAGNOSIS — M79602 Pain in left arm: Secondary | ICD-10-CM | POA: Diagnosis not present

## 2014-05-12 DIAGNOSIS — Z79899 Other long term (current) drug therapy: Secondary | ICD-10-CM | POA: Diagnosis not present

## 2014-05-12 DIAGNOSIS — Z79891 Long term (current) use of opiate analgesic: Secondary | ICD-10-CM | POA: Diagnosis not present

## 2014-05-18 DIAGNOSIS — M5030 Other cervical disc degeneration, unspecified cervical region: Secondary | ICD-10-CM | POA: Diagnosis not present

## 2014-07-06 DIAGNOSIS — Z13820 Encounter for screening for osteoporosis: Secondary | ICD-10-CM | POA: Diagnosis not present

## 2014-07-06 DIAGNOSIS — N951 Menopausal and female climacteric states: Secondary | ICD-10-CM | POA: Diagnosis not present

## 2014-07-07 DIAGNOSIS — G894 Chronic pain syndrome: Secondary | ICD-10-CM | POA: Diagnosis not present

## 2014-07-07 DIAGNOSIS — M5030 Other cervical disc degeneration, unspecified cervical region: Secondary | ICD-10-CM | POA: Diagnosis not present

## 2014-07-07 DIAGNOSIS — M1990 Unspecified osteoarthritis, unspecified site: Secondary | ICD-10-CM | POA: Diagnosis not present

## 2014-07-07 DIAGNOSIS — M5412 Radiculopathy, cervical region: Secondary | ICD-10-CM | POA: Diagnosis not present

## 2014-07-07 DIAGNOSIS — M47812 Spondylosis without myelopathy or radiculopathy, cervical region: Secondary | ICD-10-CM | POA: Diagnosis not present

## 2014-07-07 DIAGNOSIS — Z79899 Other long term (current) drug therapy: Secondary | ICD-10-CM | POA: Diagnosis not present

## 2014-07-14 DIAGNOSIS — D239 Other benign neoplasm of skin, unspecified: Secondary | ICD-10-CM | POA: Diagnosis not present

## 2014-07-14 DIAGNOSIS — D485 Neoplasm of uncertain behavior of skin: Secondary | ICD-10-CM | POA: Diagnosis not present

## 2014-07-14 DIAGNOSIS — L723 Sebaceous cyst: Secondary | ICD-10-CM | POA: Diagnosis not present

## 2014-07-14 DIAGNOSIS — D1801 Hemangioma of skin and subcutaneous tissue: Secondary | ICD-10-CM | POA: Diagnosis not present

## 2014-08-03 DIAGNOSIS — M5412 Radiculopathy, cervical region: Secondary | ICD-10-CM | POA: Diagnosis not present

## 2014-08-03 DIAGNOSIS — M5030 Other cervical disc degeneration, unspecified cervical region: Secondary | ICD-10-CM | POA: Diagnosis not present

## 2014-08-03 DIAGNOSIS — M542 Cervicalgia: Secondary | ICD-10-CM | POA: Diagnosis not present

## 2014-09-04 DIAGNOSIS — M797 Fibromyalgia: Secondary | ICD-10-CM | POA: Diagnosis not present

## 2014-09-04 DIAGNOSIS — Z79899 Other long term (current) drug therapy: Secondary | ICD-10-CM | POA: Diagnosis not present

## 2014-09-04 DIAGNOSIS — G894 Chronic pain syndrome: Secondary | ICD-10-CM | POA: Diagnosis not present

## 2014-09-04 DIAGNOSIS — M5412 Radiculopathy, cervical region: Secondary | ICD-10-CM | POA: Diagnosis not present

## 2014-09-04 DIAGNOSIS — M5030 Other cervical disc degeneration, unspecified cervical region: Secondary | ICD-10-CM | POA: Diagnosis not present

## 2014-09-07 DIAGNOSIS — M542 Cervicalgia: Secondary | ICD-10-CM | POA: Diagnosis not present

## 2014-09-07 DIAGNOSIS — M5030 Other cervical disc degeneration, unspecified cervical region: Secondary | ICD-10-CM | POA: Diagnosis not present

## 2014-09-07 DIAGNOSIS — M5412 Radiculopathy, cervical region: Secondary | ICD-10-CM | POA: Diagnosis not present

## 2014-09-15 DIAGNOSIS — H43813 Vitreous degeneration, bilateral: Secondary | ICD-10-CM | POA: Diagnosis not present

## 2014-09-27 DIAGNOSIS — H25012 Cortical age-related cataract, left eye: Secondary | ICD-10-CM | POA: Diagnosis not present

## 2014-09-27 DIAGNOSIS — H2511 Age-related nuclear cataract, right eye: Secondary | ICD-10-CM | POA: Diagnosis not present

## 2014-09-27 DIAGNOSIS — H2512 Age-related nuclear cataract, left eye: Secondary | ICD-10-CM | POA: Diagnosis not present

## 2014-09-27 DIAGNOSIS — H25011 Cortical age-related cataract, right eye: Secondary | ICD-10-CM | POA: Diagnosis not present

## 2014-10-19 DIAGNOSIS — M5030 Other cervical disc degeneration, unspecified cervical region: Secondary | ICD-10-CM | POA: Diagnosis not present

## 2014-10-19 DIAGNOSIS — G894 Chronic pain syndrome: Secondary | ICD-10-CM | POA: Diagnosis not present

## 2014-10-19 DIAGNOSIS — M542 Cervicalgia: Secondary | ICD-10-CM | POA: Diagnosis not present

## 2014-10-19 DIAGNOSIS — M5412 Radiculopathy, cervical region: Secondary | ICD-10-CM | POA: Diagnosis not present

## 2014-10-31 DIAGNOSIS — M5030 Other cervical disc degeneration, unspecified cervical region: Secondary | ICD-10-CM | POA: Diagnosis not present

## 2014-10-31 DIAGNOSIS — Z79899 Other long term (current) drug therapy: Secondary | ICD-10-CM | POA: Diagnosis not present

## 2014-10-31 DIAGNOSIS — G894 Chronic pain syndrome: Secondary | ICD-10-CM | POA: Diagnosis not present

## 2014-10-31 DIAGNOSIS — M5412 Radiculopathy, cervical region: Secondary | ICD-10-CM | POA: Diagnosis not present

## 2014-10-31 DIAGNOSIS — M150 Primary generalized (osteo)arthritis: Secondary | ICD-10-CM | POA: Diagnosis not present

## 2014-10-31 DIAGNOSIS — M797 Fibromyalgia: Secondary | ICD-10-CM | POA: Diagnosis not present

## 2014-11-07 DIAGNOSIS — F329 Major depressive disorder, single episode, unspecified: Secondary | ICD-10-CM | POA: Diagnosis not present

## 2014-11-07 DIAGNOSIS — H269 Unspecified cataract: Secondary | ICD-10-CM | POA: Diagnosis not present

## 2014-11-07 DIAGNOSIS — Z90710 Acquired absence of both cervix and uterus: Secondary | ICD-10-CM | POA: Diagnosis not present

## 2014-11-07 DIAGNOSIS — Z85038 Personal history of other malignant neoplasm of large intestine: Secondary | ICD-10-CM | POA: Diagnosis not present

## 2014-11-07 DIAGNOSIS — H2512 Age-related nuclear cataract, left eye: Secondary | ICD-10-CM | POA: Diagnosis not present

## 2014-11-07 DIAGNOSIS — E785 Hyperlipidemia, unspecified: Secondary | ICD-10-CM | POA: Diagnosis not present

## 2014-11-07 DIAGNOSIS — G8929 Other chronic pain: Secondary | ICD-10-CM | POA: Diagnosis not present

## 2014-11-07 DIAGNOSIS — Z79899 Other long term (current) drug therapy: Secondary | ICD-10-CM | POA: Diagnosis not present

## 2014-11-07 DIAGNOSIS — G2581 Restless legs syndrome: Secondary | ICD-10-CM | POA: Diagnosis not present

## 2014-11-07 DIAGNOSIS — I10 Essential (primary) hypertension: Secondary | ICD-10-CM | POA: Diagnosis not present

## 2014-11-07 DIAGNOSIS — G709 Myoneural disorder, unspecified: Secondary | ICD-10-CM | POA: Diagnosis not present

## 2014-11-07 DIAGNOSIS — F419 Anxiety disorder, unspecified: Secondary | ICD-10-CM | POA: Diagnosis not present

## 2014-11-07 DIAGNOSIS — Z9049 Acquired absence of other specified parts of digestive tract: Secondary | ICD-10-CM | POA: Diagnosis not present

## 2014-11-07 DIAGNOSIS — H259 Unspecified age-related cataract: Secondary | ICD-10-CM | POA: Diagnosis not present

## 2014-11-07 DIAGNOSIS — Z79891 Long term (current) use of opiate analgesic: Secondary | ICD-10-CM | POA: Diagnosis not present

## 2014-11-07 DIAGNOSIS — Z7982 Long term (current) use of aspirin: Secondary | ICD-10-CM | POA: Diagnosis not present

## 2014-11-07 DIAGNOSIS — Z96651 Presence of right artificial knee joint: Secondary | ICD-10-CM | POA: Diagnosis not present

## 2014-11-07 DIAGNOSIS — M1990 Unspecified osteoarthritis, unspecified site: Secondary | ICD-10-CM | POA: Diagnosis not present

## 2014-11-07 DIAGNOSIS — K219 Gastro-esophageal reflux disease without esophagitis: Secondary | ICD-10-CM | POA: Diagnosis not present

## 2014-11-07 DIAGNOSIS — Z9221 Personal history of antineoplastic chemotherapy: Secondary | ICD-10-CM | POA: Diagnosis not present

## 2014-11-07 DIAGNOSIS — Z888 Allergy status to other drugs, medicaments and biological substances status: Secondary | ICD-10-CM | POA: Diagnosis not present

## 2014-11-07 DIAGNOSIS — M542 Cervicalgia: Secondary | ICD-10-CM | POA: Diagnosis not present

## 2014-11-07 DIAGNOSIS — Z791 Long term (current) use of non-steroidal anti-inflammatories (NSAID): Secondary | ICD-10-CM | POA: Diagnosis not present

## 2014-11-08 DIAGNOSIS — H2511 Age-related nuclear cataract, right eye: Secondary | ICD-10-CM | POA: Diagnosis not present

## 2014-11-20 ENCOUNTER — Encounter: Payer: Self-pay | Admitting: Cardiology

## 2014-11-20 DIAGNOSIS — M797 Fibromyalgia: Secondary | ICD-10-CM | POA: Diagnosis not present

## 2014-11-20 DIAGNOSIS — I10 Essential (primary) hypertension: Secondary | ICD-10-CM | POA: Diagnosis not present

## 2014-11-20 DIAGNOSIS — E785 Hyperlipidemia, unspecified: Secondary | ICD-10-CM | POA: Diagnosis not present

## 2014-11-20 DIAGNOSIS — F329 Major depressive disorder, single episode, unspecified: Secondary | ICD-10-CM | POA: Diagnosis not present

## 2014-11-28 DIAGNOSIS — Z888 Allergy status to other drugs, medicaments and biological substances status: Secondary | ICD-10-CM | POA: Diagnosis not present

## 2014-11-28 DIAGNOSIS — I10 Essential (primary) hypertension: Secondary | ICD-10-CM | POA: Diagnosis not present

## 2014-11-28 DIAGNOSIS — F329 Major depressive disorder, single episode, unspecified: Secondary | ICD-10-CM | POA: Diagnosis not present

## 2014-11-28 DIAGNOSIS — M1990 Unspecified osteoarthritis, unspecified site: Secondary | ICD-10-CM | POA: Diagnosis not present

## 2014-11-28 DIAGNOSIS — E780 Pure hypercholesterolemia: Secondary | ICD-10-CM | POA: Diagnosis not present

## 2014-11-28 DIAGNOSIS — Z79899 Other long term (current) drug therapy: Secondary | ICD-10-CM | POA: Diagnosis not present

## 2014-11-28 DIAGNOSIS — H2512 Age-related nuclear cataract, left eye: Secondary | ICD-10-CM | POA: Diagnosis not present

## 2014-11-28 DIAGNOSIS — H259 Unspecified age-related cataract: Secondary | ICD-10-CM | POA: Diagnosis not present

## 2014-11-28 DIAGNOSIS — K219 Gastro-esophageal reflux disease without esophagitis: Secondary | ICD-10-CM | POA: Diagnosis not present

## 2014-11-28 DIAGNOSIS — H2511 Age-related nuclear cataract, right eye: Secondary | ICD-10-CM | POA: Diagnosis not present

## 2014-11-28 DIAGNOSIS — F419 Anxiety disorder, unspecified: Secondary | ICD-10-CM | POA: Diagnosis not present

## 2014-11-28 DIAGNOSIS — Z7982 Long term (current) use of aspirin: Secondary | ICD-10-CM | POA: Diagnosis not present

## 2014-12-11 DIAGNOSIS — Z7982 Long term (current) use of aspirin: Secondary | ICD-10-CM | POA: Diagnosis not present

## 2014-12-11 DIAGNOSIS — R1013 Epigastric pain: Secondary | ICD-10-CM | POA: Diagnosis not present

## 2014-12-11 DIAGNOSIS — M47819 Spondylosis without myelopathy or radiculopathy, site unspecified: Secondary | ICD-10-CM | POA: Diagnosis not present

## 2014-12-11 DIAGNOSIS — Z85038 Personal history of other malignant neoplasm of large intestine: Secondary | ICD-10-CM | POA: Diagnosis not present

## 2014-12-11 DIAGNOSIS — R000 Tachycardia, unspecified: Secondary | ICD-10-CM | POA: Diagnosis not present

## 2014-12-11 DIAGNOSIS — R932 Abnormal findings on diagnostic imaging of liver and biliary tract: Secondary | ICD-10-CM | POA: Diagnosis not present

## 2014-12-11 DIAGNOSIS — R10816 Epigastric abdominal tenderness: Secondary | ICD-10-CM | POA: Diagnosis not present

## 2014-12-11 DIAGNOSIS — K449 Diaphragmatic hernia without obstruction or gangrene: Secondary | ICD-10-CM | POA: Diagnosis not present

## 2014-12-11 DIAGNOSIS — K760 Fatty (change of) liver, not elsewhere classified: Secondary | ICD-10-CM | POA: Diagnosis not present

## 2014-12-11 DIAGNOSIS — K2950 Unspecified chronic gastritis without bleeding: Secondary | ICD-10-CM | POA: Diagnosis not present

## 2014-12-11 DIAGNOSIS — K219 Gastro-esophageal reflux disease without esophagitis: Secondary | ICD-10-CM | POA: Diagnosis not present

## 2014-12-11 DIAGNOSIS — Z888 Allergy status to other drugs, medicaments and biological substances status: Secondary | ICD-10-CM | POA: Diagnosis not present

## 2014-12-11 DIAGNOSIS — N289 Disorder of kidney and ureter, unspecified: Secondary | ICD-10-CM | POA: Diagnosis not present

## 2014-12-11 DIAGNOSIS — K838 Other specified diseases of biliary tract: Secondary | ICD-10-CM | POA: Diagnosis not present

## 2014-12-11 DIAGNOSIS — R10819 Abdominal tenderness, unspecified site: Secondary | ICD-10-CM | POA: Diagnosis not present

## 2014-12-11 DIAGNOSIS — Z79899 Other long term (current) drug therapy: Secondary | ICD-10-CM | POA: Diagnosis not present

## 2014-12-11 DIAGNOSIS — R197 Diarrhea, unspecified: Secondary | ICD-10-CM | POA: Diagnosis not present

## 2014-12-11 DIAGNOSIS — I10 Essential (primary) hypertension: Secondary | ICD-10-CM | POA: Diagnosis not present

## 2014-12-11 DIAGNOSIS — F329 Major depressive disorder, single episode, unspecified: Secondary | ICD-10-CM | POA: Diagnosis not present

## 2014-12-11 DIAGNOSIS — E785 Hyperlipidemia, unspecified: Secondary | ICD-10-CM | POA: Diagnosis not present

## 2014-12-11 DIAGNOSIS — R109 Unspecified abdominal pain: Secondary | ICD-10-CM | POA: Diagnosis not present

## 2014-12-11 DIAGNOSIS — M797 Fibromyalgia: Secondary | ICD-10-CM | POA: Diagnosis not present

## 2014-12-11 DIAGNOSIS — R112 Nausea with vomiting, unspecified: Secondary | ICD-10-CM | POA: Diagnosis not present

## 2014-12-12 DIAGNOSIS — I10 Essential (primary) hypertension: Secondary | ICD-10-CM | POA: Diagnosis not present

## 2014-12-12 DIAGNOSIS — K219 Gastro-esophageal reflux disease without esophagitis: Secondary | ICD-10-CM | POA: Diagnosis not present

## 2014-12-12 DIAGNOSIS — R1310 Dysphagia, unspecified: Secondary | ICD-10-CM | POA: Diagnosis not present

## 2014-12-12 DIAGNOSIS — R112 Nausea with vomiting, unspecified: Secondary | ICD-10-CM | POA: Diagnosis not present

## 2014-12-12 DIAGNOSIS — R1013 Epigastric pain: Secondary | ICD-10-CM | POA: Diagnosis not present

## 2014-12-13 DIAGNOSIS — I10 Essential (primary) hypertension: Secondary | ICD-10-CM | POA: Diagnosis not present

## 2014-12-13 DIAGNOSIS — K2950 Unspecified chronic gastritis without bleeding: Secondary | ICD-10-CM | POA: Diagnosis not present

## 2014-12-13 DIAGNOSIS — R112 Nausea with vomiting, unspecified: Secondary | ICD-10-CM | POA: Diagnosis not present

## 2014-12-13 DIAGNOSIS — R1013 Epigastric pain: Secondary | ICD-10-CM | POA: Diagnosis not present

## 2014-12-13 DIAGNOSIS — K219 Gastro-esophageal reflux disease without esophagitis: Secondary | ICD-10-CM | POA: Diagnosis not present

## 2014-12-13 DIAGNOSIS — R1310 Dysphagia, unspecified: Secondary | ICD-10-CM | POA: Diagnosis not present

## 2014-12-27 ENCOUNTER — Encounter: Payer: Self-pay | Admitting: Cardiology

## 2014-12-27 DIAGNOSIS — R413 Other amnesia: Secondary | ICD-10-CM | POA: Diagnosis not present

## 2014-12-27 DIAGNOSIS — I10 Essential (primary) hypertension: Secondary | ICD-10-CM | POA: Diagnosis not present

## 2014-12-27 DIAGNOSIS — R112 Nausea with vomiting, unspecified: Secondary | ICD-10-CM | POA: Diagnosis not present

## 2014-12-27 DIAGNOSIS — Z09 Encounter for follow-up examination after completed treatment for conditions other than malignant neoplasm: Secondary | ICD-10-CM | POA: Diagnosis not present

## 2014-12-27 DIAGNOSIS — E538 Deficiency of other specified B group vitamins: Secondary | ICD-10-CM | POA: Diagnosis not present

## 2014-12-28 DIAGNOSIS — M5030 Other cervical disc degeneration, unspecified cervical region: Secondary | ICD-10-CM | POA: Diagnosis not present

## 2014-12-28 DIAGNOSIS — Z79899 Other long term (current) drug therapy: Secondary | ICD-10-CM | POA: Diagnosis not present

## 2014-12-28 DIAGNOSIS — M5412 Radiculopathy, cervical region: Secondary | ICD-10-CM | POA: Diagnosis not present

## 2014-12-28 DIAGNOSIS — G894 Chronic pain syndrome: Secondary | ICD-10-CM | POA: Diagnosis not present

## 2014-12-28 DIAGNOSIS — M542 Cervicalgia: Secondary | ICD-10-CM | POA: Diagnosis not present

## 2014-12-28 DIAGNOSIS — M797 Fibromyalgia: Secondary | ICD-10-CM | POA: Diagnosis not present

## 2015-01-25 DIAGNOSIS — K5900 Constipation, unspecified: Secondary | ICD-10-CM | POA: Diagnosis not present

## 2015-01-25 DIAGNOSIS — R112 Nausea with vomiting, unspecified: Secondary | ICD-10-CM | POA: Diagnosis not present

## 2015-02-22 DIAGNOSIS — M5030 Other cervical disc degeneration, unspecified cervical region: Secondary | ICD-10-CM | POA: Diagnosis not present

## 2015-02-22 DIAGNOSIS — Z79899 Other long term (current) drug therapy: Secondary | ICD-10-CM | POA: Diagnosis not present

## 2015-02-22 DIAGNOSIS — G894 Chronic pain syndrome: Secondary | ICD-10-CM | POA: Diagnosis not present

## 2015-02-22 DIAGNOSIS — M47812 Spondylosis without myelopathy or radiculopathy, cervical region: Secondary | ICD-10-CM | POA: Diagnosis not present

## 2015-02-22 DIAGNOSIS — M542 Cervicalgia: Secondary | ICD-10-CM | POA: Diagnosis not present

## 2015-02-22 DIAGNOSIS — M5412 Radiculopathy, cervical region: Secondary | ICD-10-CM | POA: Diagnosis not present

## 2015-02-22 DIAGNOSIS — M961 Postlaminectomy syndrome, not elsewhere classified: Secondary | ICD-10-CM | POA: Diagnosis not present

## 2015-04-03 ENCOUNTER — Encounter: Payer: Self-pay | Admitting: Cardiology

## 2015-04-03 DIAGNOSIS — G4700 Insomnia, unspecified: Secondary | ICD-10-CM | POA: Diagnosis not present

## 2015-04-03 DIAGNOSIS — Z23 Encounter for immunization: Secondary | ICD-10-CM | POA: Diagnosis not present

## 2015-04-03 DIAGNOSIS — I10 Essential (primary) hypertension: Secondary | ICD-10-CM | POA: Diagnosis not present

## 2015-04-03 DIAGNOSIS — M797 Fibromyalgia: Secondary | ICD-10-CM | POA: Diagnosis not present

## 2015-04-03 DIAGNOSIS — E785 Hyperlipidemia, unspecified: Secondary | ICD-10-CM | POA: Diagnosis not present

## 2015-04-03 DIAGNOSIS — E538 Deficiency of other specified B group vitamins: Secondary | ICD-10-CM | POA: Diagnosis not present

## 2015-04-03 DIAGNOSIS — F329 Major depressive disorder, single episode, unspecified: Secondary | ICD-10-CM | POA: Diagnosis not present

## 2015-04-06 DIAGNOSIS — Z1231 Encounter for screening mammogram for malignant neoplasm of breast: Secondary | ICD-10-CM | POA: Diagnosis not present

## 2015-04-12 DIAGNOSIS — Z79899 Other long term (current) drug therapy: Secondary | ICD-10-CM | POA: Diagnosis not present

## 2015-04-12 DIAGNOSIS — M5412 Radiculopathy, cervical region: Secondary | ICD-10-CM | POA: Diagnosis not present

## 2015-04-12 DIAGNOSIS — M961 Postlaminectomy syndrome, not elsewhere classified: Secondary | ICD-10-CM | POA: Diagnosis not present

## 2015-04-12 DIAGNOSIS — G894 Chronic pain syndrome: Secondary | ICD-10-CM | POA: Diagnosis not present

## 2015-04-12 DIAGNOSIS — M5030 Other cervical disc degeneration, unspecified cervical region: Secondary | ICD-10-CM | POA: Diagnosis not present

## 2015-06-07 DIAGNOSIS — M5030 Other cervical disc degeneration, unspecified cervical region: Secondary | ICD-10-CM | POA: Diagnosis not present

## 2015-06-07 DIAGNOSIS — M5412 Radiculopathy, cervical region: Secondary | ICD-10-CM | POA: Diagnosis not present

## 2015-06-07 DIAGNOSIS — M961 Postlaminectomy syndrome, not elsewhere classified: Secondary | ICD-10-CM | POA: Diagnosis not present

## 2015-06-07 DIAGNOSIS — G894 Chronic pain syndrome: Secondary | ICD-10-CM | POA: Diagnosis not present

## 2015-06-07 DIAGNOSIS — M542 Cervicalgia: Secondary | ICD-10-CM | POA: Diagnosis not present

## 2015-06-07 DIAGNOSIS — Z79899 Other long term (current) drug therapy: Secondary | ICD-10-CM | POA: Diagnosis not present

## 2015-08-02 DIAGNOSIS — G894 Chronic pain syndrome: Secondary | ICD-10-CM | POA: Diagnosis not present

## 2015-08-02 DIAGNOSIS — M5030 Other cervical disc degeneration, unspecified cervical region: Secondary | ICD-10-CM | POA: Diagnosis not present

## 2015-08-02 DIAGNOSIS — Z79899 Other long term (current) drug therapy: Secondary | ICD-10-CM | POA: Diagnosis not present

## 2015-08-02 DIAGNOSIS — M961 Postlaminectomy syndrome, not elsewhere classified: Secondary | ICD-10-CM | POA: Diagnosis not present

## 2015-08-02 DIAGNOSIS — M5412 Radiculopathy, cervical region: Secondary | ICD-10-CM | POA: Diagnosis not present

## 2015-08-03 ENCOUNTER — Encounter: Payer: Self-pay | Admitting: Cardiology

## 2015-08-03 DIAGNOSIS — I10 Essential (primary) hypertension: Secondary | ICD-10-CM | POA: Diagnosis not present

## 2015-08-03 DIAGNOSIS — R0602 Shortness of breath: Secondary | ICD-10-CM | POA: Diagnosis not present

## 2015-08-03 DIAGNOSIS — F329 Major depressive disorder, single episode, unspecified: Secondary | ICD-10-CM | POA: Diagnosis not present

## 2015-08-03 DIAGNOSIS — Z0000 Encounter for general adult medical examination without abnormal findings: Secondary | ICD-10-CM | POA: Diagnosis not present

## 2015-08-03 DIAGNOSIS — E785 Hyperlipidemia, unspecified: Secondary | ICD-10-CM | POA: Diagnosis not present

## 2015-08-03 DIAGNOSIS — K5900 Constipation, unspecified: Secondary | ICD-10-CM | POA: Diagnosis not present

## 2015-08-03 DIAGNOSIS — M797 Fibromyalgia: Secondary | ICD-10-CM | POA: Diagnosis not present

## 2015-08-03 DIAGNOSIS — K589 Irritable bowel syndrome without diarrhea: Secondary | ICD-10-CM | POA: Diagnosis not present

## 2015-09-17 ENCOUNTER — Encounter: Payer: Self-pay | Admitting: Cardiology

## 2015-09-17 DIAGNOSIS — K5903 Drug induced constipation: Secondary | ICD-10-CM | POA: Diagnosis not present

## 2015-09-17 DIAGNOSIS — D72829 Elevated white blood cell count, unspecified: Secondary | ICD-10-CM | POA: Diagnosis not present

## 2015-09-17 DIAGNOSIS — G43A0 Cyclical vomiting, not intractable: Secondary | ICD-10-CM | POA: Diagnosis not present

## 2015-09-17 DIAGNOSIS — R1013 Epigastric pain: Secondary | ICD-10-CM | POA: Diagnosis not present

## 2015-09-27 DIAGNOSIS — M5412 Radiculopathy, cervical region: Secondary | ICD-10-CM | POA: Diagnosis not present

## 2015-09-27 DIAGNOSIS — M5030 Other cervical disc degeneration, unspecified cervical region: Secondary | ICD-10-CM | POA: Diagnosis not present

## 2015-10-17 DIAGNOSIS — K5904 Chronic idiopathic constipation: Secondary | ICD-10-CM | POA: Diagnosis not present

## 2015-10-26 ENCOUNTER — Encounter: Payer: Self-pay | Admitting: *Deleted

## 2015-11-01 ENCOUNTER — Encounter: Payer: Self-pay | Admitting: *Deleted

## 2015-11-01 ENCOUNTER — Ambulatory Visit: Payer: Medicare Other | Admitting: Cardiology

## 2015-11-05 ENCOUNTER — Encounter: Payer: Self-pay | Admitting: Cardiology

## 2015-11-05 DIAGNOSIS — I10 Essential (primary) hypertension: Secondary | ICD-10-CM | POA: Diagnosis not present

## 2015-11-05 DIAGNOSIS — F329 Major depressive disorder, single episode, unspecified: Secondary | ICD-10-CM | POA: Diagnosis not present

## 2015-11-05 DIAGNOSIS — E785 Hyperlipidemia, unspecified: Secondary | ICD-10-CM | POA: Diagnosis not present

## 2015-11-05 DIAGNOSIS — K5900 Constipation, unspecified: Secondary | ICD-10-CM | POA: Diagnosis not present

## 2015-11-07 DIAGNOSIS — N898 Other specified noninflammatory disorders of vagina: Secondary | ICD-10-CM | POA: Diagnosis not present

## 2015-11-07 DIAGNOSIS — N952 Postmenopausal atrophic vaginitis: Secondary | ICD-10-CM | POA: Diagnosis not present

## 2015-11-07 DIAGNOSIS — N760 Acute vaginitis: Secondary | ICD-10-CM | POA: Diagnosis not present

## 2015-11-08 ENCOUNTER — Ambulatory Visit: Payer: Medicare Other | Admitting: Cardiology

## 2015-11-22 DIAGNOSIS — Z79891 Long term (current) use of opiate analgesic: Secondary | ICD-10-CM | POA: Diagnosis not present

## 2015-11-22 DIAGNOSIS — M542 Cervicalgia: Secondary | ICD-10-CM | POA: Diagnosis not present

## 2015-11-22 DIAGNOSIS — Z79899 Other long term (current) drug therapy: Secondary | ICD-10-CM | POA: Diagnosis not present

## 2015-11-22 DIAGNOSIS — M5030 Other cervical disc degeneration, unspecified cervical region: Secondary | ICD-10-CM | POA: Diagnosis not present

## 2015-11-22 DIAGNOSIS — M5412 Radiculopathy, cervical region: Secondary | ICD-10-CM | POA: Diagnosis not present

## 2015-11-22 DIAGNOSIS — G894 Chronic pain syndrome: Secondary | ICD-10-CM | POA: Diagnosis not present

## 2015-11-27 ENCOUNTER — Ambulatory Visit (INDEPENDENT_AMBULATORY_CARE_PROVIDER_SITE_OTHER): Payer: Medicare Other | Admitting: Cardiology

## 2015-11-27 ENCOUNTER — Encounter: Payer: Self-pay | Admitting: Cardiology

## 2015-11-27 VITALS — BP 152/76 | HR 79 | Ht 65.5 in | Wt 172.2 lb

## 2015-11-27 DIAGNOSIS — Z8249 Family history of ischemic heart disease and other diseases of the circulatory system: Secondary | ICD-10-CM

## 2015-11-27 DIAGNOSIS — E785 Hyperlipidemia, unspecified: Secondary | ICD-10-CM | POA: Diagnosis not present

## 2015-11-27 DIAGNOSIS — I10 Essential (primary) hypertension: Secondary | ICD-10-CM

## 2015-11-27 DIAGNOSIS — R079 Chest pain, unspecified: Secondary | ICD-10-CM | POA: Insufficient documentation

## 2015-11-27 DIAGNOSIS — R072 Precordial pain: Secondary | ICD-10-CM | POA: Diagnosis not present

## 2015-11-27 NOTE — Progress Notes (Signed)
Cardiology Office Note    Date:  11/27/2015   ID:  Angela [name], DOB 06-28-1945, MRN [id number]  PCP:  [name], MD  Cardiologist:  Ena Dawley, MD  Referring physician: [name], MD   Chief complain: Chest pain  History of Present Illness:  Angela [name] is a 70 y.o. female who is a delightful patient coming for concern of exertional chest pain. She is very active living with her granddaughter walking a lot and has noticed in the last year worsening dyspnea on exertion especially when walking up the stairs or carrying bags. This is associated with retrosternal heaviness that resolves at rest. She denies any palpitations or syncope. The patient has prior medical history of hyperlipidemia treated with rosuvastatin 10 mg daily the latest LDL was 124, she also has a history of hypertension treated with diltiazem and combination of telmisartan hydrochlorothiazide. She denies any lower extremity edema orthopnea or paroxysmal nocturnal dyspnea. She is worrying especially because she has very significant family history of premature coronary artery disease, specifically her mother who had first myocardial infarction at age of 58, father at age of 51, and her sister passed away at age of 59 of myocardial infarction. Her aunt died during cardiac catheterization. None of patient's relatives including patient herself been smokers.  Past Medical History  Diagnosis Date  . GERD (gastroesophageal reflux disease)   . Hypertension   . Fibromyalgia   . Tachycardia   . DJD (degenerative joint disease)   . Hyperlipidemia   . Hemorrhoids   . Over weight   . Cancer of sigmoid ([place])   . RLS (restless legs syndrome)   . Pancreatitis   . Depression (emotion)   . Anxiety   . IBS (irritable bowel syndrome)   . MRSA (methicillin resistant Staphylococcus aureus) septicemia ([place])     history of    Past Surgical History  Procedure Laterality Date  . Child birth      Current  Medications: Outpatient Prescriptions Prior to Visit  Medication Sig Dispense Refill  . AMITIZA 24 MCG capsule Take 1 capsule by mouth 2 (two) times daily.    Marland Kitchen aspirin EC 81 MG tablet Take 81 mg by mouth daily.    Marland Kitchen buPROPion (WELLBUTRIN XL) 150 MG 24 hr tablet Take two (2) tablets (300 mg total) by mouth daily in the morning and take one (1) tablet (150 mg total) by mouth daily at bedtime.    Marland Kitchen diltiazem (DILACOR XR) 240 MG 24 hr capsule Take 240 mg by mouth daily.    Marland Kitchen esomeprazole (NEXIUM) 40 MG capsule Take 40 mg by mouth 2 (two) times daily before a meal.    . fluticasone (FLONASE) 50 MCG/ACT nasal spray Place 2 sprays into both nostrils daily as needed for allergies or rhinitis.     Marland Kitchen LORazepam (ATIVAN) 2 MG tablet Take 2 mg by mouth 3 (three) times daily as needed for anxiety.    . Milnacipran HCl (SAVELLA) 100 MG TABS tablet Take 100 mg by mouth 2 (two) times daily.    Marland Kitchen omega-3 acid ethyl esters (LOVAZA) 1 g capsule Take 1 g by mouth 2 (two) times daily.    . ondansetron (ZOFRAN-ODT) 4 MG disintegrating tablet Take 4 mg by mouth every 6 (six) hours as needed for nausea or vomiting.    . Oxycodone HCl 10 MG TABS Take 10 mg by mouth every 6 (six) hours.    Marland Kitchen rOPINIRole (REQUIP) 1 MG tablet Take 1 mg by  mouth 2 (two) times daily. ONE TABLET  IN AM TWO TABLETS IN PM    . rosuvastatin (CRESTOR) 10 MG tablet Take 10 mg by mouth daily.    Marland Kitchen telmisartan-hydrochlorothiazide (MICARDIS HCT) 80-25 MG tablet Take 1 tablet by mouth daily.    Marland Kitchen tiZANidine (ZANAFLEX) 4 MG capsule Take 4 mg by mouth every 8 (eight) hours as needed for muscle spasms.    Marland Kitchen linaclotide (LINZESS) 145 MCG CAPS capsule Take 145 mcg by mouth daily before breakfast.     No facility-administered medications prior to visit.     Allergies:   Norvasc; Phenergan; and Timentin   Social History   Social History  . Marital Status: Married    Spouse Name: [name]  . Number of Children: [name]  . Years of Education: [name]   Social  History Main Topics  . Smoking status: Never Smoker   . Smokeless tobacco: None  . Alcohol Use: None  . Drug Use: None  . Sexual Activity: Not Asked   Other Topics Concern  . None   Social History Narrative     Family History:  The patient's family history includes Diabetes in her sister; Heart attack in her father; Heart disease in her mother; Hypertension in her mother and sister; Stroke in her father.   ROS:   Please see the history of present illness.    ROS All other systems reviewed and are negative.   PHYSICAL EXAM:   VS:  BP 152/76 mmHg  Pulse 79  Ht 5' 5.5" (1.664 m)  Wt 172 lb 3.2 oz (78.109 kg)  BMI 28.21 kg/m2   GEN: Well nourished, well developed, in no acute distress HEENT: normal Neck: no JVD, carotid bruits, or masses Cardiac: RRR; no murmurs, rubs, or gallops,no edema  Respiratory:  clear to auscultation bilaterally, normal work of breathing GI: soft, nontender, nondistended, + BS MS: no deformity or atrophy Skin: warm and dry, no rash Neuro:  Alert and Oriented x 3, Strength and sensation are intact Psych: euthymic mood, full affect  Wt Readings from Last 3 Encounters:  11/27/15 172 lb 3.2 oz (78.109 kg)      Studies/Labs Reviewed:   EKG:  EKG is ordered today.  The ekg ordered today demonstrates SR, normal ECG  Recent Labs: No results found for requested labs within last 365 days.   Lipid Panel No results found for: CHOL, TRIG, HDL, CHOLHDL, VLDL, LDLCALC, LDLDIRECT  Additional studies/ records that were reviewed today include:  Notes from primary care physician as described in history of present illness.    ASSESSMENT:    1. Precordial pain   2. Family history of early CAD   3. Hyperlipidemia   4. Essential hypertension      PLAN:  In order of problems listed above:  1. This patient has a significant family history of premature coronary artery disease on both mother and father side and in at least 4 immediate family members. She  has a typical exertional chest pain associated with dizziness and shortness of breath, she somewhat terrified of cardiac catheterization as her close neighbor and her aunt died during cardiac catheterization. We will schedule an exercise nuclear stress test. If any unclear results we will proceed with coronary CT to further evaluate the plaque especially that she is already and rosuvastatin and her LDL cholesterol remains elevated at 124. 2. Hyperlipidemia - as above 3. Hypertension - at this point I'm not going to change her medicine as this is the first  visit, however we'll reevaluate her blood pressure during the stress tests and response to exertion and address afterwards.   Medication Adjustments/Labs and Tests Ordered: Current medicines are reviewed at length with the patient today.  Concerns regarding medicines are outlined above.  Medication changes, Labs and Tests ordered today are listed in the Patient Instructions below. Patient Instructions  Medication Instructions:   Your physician recommends that you continue on your current medications as directed. Please refer to the Current Medication list given to you today.    Testing/Procedures:  Your physician has requested that you have en exercise stress myoview. For further information please visit [email or web address]. Please follow instruction sheet, as given.    Follow-Up:  AT DR Khloe Hunkele'S NEXT AVAILABLE APPOINTMENT       If you need a refill on your cardiac medications before your next appointment, please call your pharmacy.       Signed, Ena Dawley, MD  11/27/2015 5:05 PM    [facility] [place], [place], [place]  [address] Phone: [phone]; Fax: [phone]

## 2015-11-27 NOTE — Patient Instructions (Signed)
Medication Instructions:   Your physician recommends that you continue on your current medications as directed. Please refer to the Current Medication list given to you today.    Testing/Procedures:  Your physician has requested that you have en exercise stress myoview. For further information please visit [email or web address]. Please follow instruction sheet, as given.    Follow-Up:  AT DR NELSON'S NEXT AVAILABLE APPOINTMENT       If you need a refill on your cardiac medications before your next appointment, please call your pharmacy.

## 2015-11-29 ENCOUNTER — Telehealth (HOSPITAL_COMMUNITY): Payer: Self-pay | Admitting: *Deleted

## 2015-11-29 NOTE — Telephone Encounter (Signed)
Patient's husband given detailed instructions per Myocardial Perfusion Study Information Sheet for the test on 12/05/15 at 0715. Patient notified to arrive 15 minutes early and that it is imperative to arrive on time for appointment to keep from having the test rescheduled.  If you need to cancel or reschedule your appointment, please call the office within 24 hours of your appointment. Failure to do so may result in a cancellation of your appointment, and a $50 no show fee. Patient verbalized understanding.[name], Ranae Palms

## 2015-12-05 ENCOUNTER — Ambulatory Visit (HOSPITAL_COMMUNITY): Payer: Medicare Other | Attending: Cardiovascular Disease

## 2015-12-05 DIAGNOSIS — R0609 Other forms of dyspnea: Secondary | ICD-10-CM | POA: Diagnosis not present

## 2015-12-05 DIAGNOSIS — E785 Hyperlipidemia, unspecified: Secondary | ICD-10-CM | POA: Diagnosis not present

## 2015-12-05 DIAGNOSIS — Z8249 Family history of ischemic heart disease and other diseases of the circulatory system: Secondary | ICD-10-CM | POA: Insufficient documentation

## 2015-12-05 DIAGNOSIS — R072 Precordial pain: Secondary | ICD-10-CM | POA: Insufficient documentation

## 2015-12-05 DIAGNOSIS — R9439 Abnormal result of other cardiovascular function study: Secondary | ICD-10-CM | POA: Diagnosis not present

## 2015-12-05 DIAGNOSIS — I10 Essential (primary) hypertension: Secondary | ICD-10-CM | POA: Diagnosis not present

## 2015-12-05 LAB — MYOCARDIAL PERFUSION IMAGING
Estimated workload: 7 METS
Exercise duration (min): 5 min
Exercise duration (sec): 31 s
LV dias vol: 63 mL (ref 46–106)
LV sys vol: 16 mL
MPHR: 151 {beats}/min
Peak HR: 134 {beats}/min
Percent HR: 88 %
RATE: 0.28
RPE: 19
Rest HR: 75 {beats}/min
SDS: 5
SRS: 1
SSS: 6
TID: 0.89

## 2015-12-05 MED ORDER — TECHNETIUM TC 99M TETROFOSMIN IV KIT
10.3000 | PACK | Freq: Once | INTRAVENOUS | Status: AC | PRN
  Administered 2015-12-05: 10 via INTRAVENOUS
  Filled 2015-12-05: qty 10

## 2015-12-05 MED ORDER — TECHNETIUM TC 99M TETROFOSMIN IV KIT
32.6000 | PACK | Freq: Once | INTRAVENOUS | Status: AC | PRN
  Administered 2015-12-05: 33 via INTRAVENOUS
  Filled 2015-12-05: qty 33

## 2015-12-11 ENCOUNTER — Telehealth: Payer: Self-pay | Admitting: Cardiology

## 2015-12-11 NOTE — Telephone Encounter (Signed)
Mrs. Roszak is calling to get her results from her stress test . Please call

## 2015-12-12 ENCOUNTER — Encounter: Payer: Self-pay | Admitting: *Deleted

## 2015-12-12 ENCOUNTER — Telehealth: Payer: Self-pay | Admitting: *Deleted

## 2015-12-12 DIAGNOSIS — Z8249 Family history of ischemic heart disease and other diseases of the circulatory system: Secondary | ICD-10-CM

## 2015-12-12 DIAGNOSIS — R072 Precordial pain: Secondary | ICD-10-CM

## 2015-12-12 DIAGNOSIS — R9439 Abnormal result of other cardiovascular function study: Secondary | ICD-10-CM | POA: Insufficient documentation

## 2015-12-12 DIAGNOSIS — E785 Hyperlipidemia, unspecified: Secondary | ICD-10-CM

## 2015-12-12 DIAGNOSIS — Z01812 Encounter for preprocedural laboratory examination: Secondary | ICD-10-CM

## 2015-12-12 MED ORDER — LOSARTAN POTASSIUM 25 MG PO TABS: 25.0000 mg | ORAL_TABLET | Freq: Every day | ORAL | Status: DC

## 2015-12-12 NOTE — Telephone Encounter (Signed)
-----   Message from Hetty Blend, RN sent at 12/12/2015  7:46 AM EDT -----   ----- Message -----    From: Dorothy Spark, MD    Sent: 12/11/2015  10:29 AM      To: Nuala Alpha, LPN, [facility] Triage  She has a normal LV function but an abnormal stress test. I would schedule a left cardiac cath. Also add losartan 25 mg po daily for BP hypertension.   Please oredr pre cath labs - including CMP, CBC, INR.  Thank you,  KN

## 2015-12-12 NOTE — Telephone Encounter (Signed)
Done

## 2015-12-12 NOTE — Telephone Encounter (Signed)
Notified the pt that per Dr [name], her stress test showed normal LVEF, but a abnormal stress test.  Informed the pt that Dr [name] recommends that she be scheduled for a left cardiac cath.  Also informed the pt that per Dr [name], we also need to add losartan 25 mg po daily to her regimen, for BP HTN on her stress.  Pt request for her cath to be scheduled for 12/26/15 and lab appt for 7/24.  Pts left cardiac cath is scheduled for Wednesday 12/26/15 at 0830 with Dr Claiborne Billings at [facility].  Pt must arrive at [facility] by 0630 the day of her cath.  Pts pre-procedure lab exam is scheduled for Monday 12/24/15 to check a cmet, cbc w diff, and pt/inr.  Informed the pt that I will leave her cardiac cath lab instructions at the front desk for her to pick up.  Went over cath instructions with the pt over the phone as well.  Instructed her to make sure she takes her ASA and hold Micardis-HCTZ the morning of her cath. Confirmed the pharmacy of choice with the pt.  Pt verbalized understanding and agrees with this plan.  Will send Dr [name] a message to place orders in and pre-cert a message as well.

## 2015-12-12 NOTE — Addendum Note (Signed)
Addended by: Dorothy Spark on: 12/12/2015 02:06 PM   Modules accepted: Orders

## 2015-12-13 ENCOUNTER — Encounter: Payer: Self-pay | Admitting: Cardiology

## 2015-12-13 NOTE — Telephone Encounter (Signed)
Per Dr [name], the pt should continue diltiazem, stop taking micardis for now. This is based on the mychart message the pt sent to Dr [name] to review and advise on today.  Left a detailed message on the pts confirmed VM about Dr Francesca Oman recommendations as mentioned above, and left her a message to call back with any additional questions regarding this matter.

## 2015-12-24 ENCOUNTER — Other Ambulatory Visit: Payer: Medicare Other | Admitting: *Deleted

## 2015-12-24 DIAGNOSIS — Z01812 Encounter for preprocedural laboratory examination: Secondary | ICD-10-CM

## 2015-12-24 DIAGNOSIS — Z7901 Long term (current) use of anticoagulants: Secondary | ICD-10-CM | POA: Diagnosis not present

## 2015-12-24 LAB — COMPREHENSIVE METABOLIC PANEL
ALT: 18 U/L (ref 6–29)
AST: 13 U/L (ref 10–35)
Albumin: 4.3 g/dL (ref 3.6–5.1)
Alkaline Phosphatase: 92 U/L (ref 33–130)
BUN: 18 mg/dL (ref 7–25)
CO2: 26 mmol/L (ref 20–31)
Calcium: 9.2 mg/dL (ref 8.6–10.4)
Chloride: 109 mmol/L (ref 98–110)
Creat: 1.06 mg/dL — ABNORMAL HIGH (ref 0.50–0.99)
Glucose, Bld: 114 mg/dL — ABNORMAL HIGH (ref 65–99)
Potassium: 4.2 mmol/L (ref 3.5–5.3)
Sodium: 142 mmol/L (ref 135–146)
Total Bilirubin: 0.3 mg/dL (ref 0.2–1.2)
Total Protein: 6.3 g/dL (ref 6.1–8.1)

## 2015-12-24 LAB — CBC WITH DIFFERENTIAL/PLATELET
Basophils Absolute: 0 cells/uL (ref 0–200)
Basophils Relative: 0 %
Eosinophils Absolute: 100 cells/uL (ref 15–500)
Eosinophils Relative: 2 %
HCT: 37.8 % (ref 35.0–45.0)
Hemoglobin: 12.5 g/dL (ref 11.7–15.5)
Lymphocytes Relative: 46 %
Lymphs Abs: 2300 cells/uL (ref 850–3900)
MCH: 28.9 pg (ref 27.0–33.0)
MCHC: 33.1 g/dL (ref 32.0–36.0)
MCV: 87.5 fL (ref 80.0–100.0)
MPV: 9.4 fL (ref 7.5–12.5)
Monocytes Absolute: 450 cells/uL (ref 200–950)
Monocytes Relative: 9 %
Neutro Abs: 2150 cells/uL (ref 1500–7800)
Neutrophils Relative %: 43 %
Platelets: 278 10*3/uL (ref 140–400)
RBC: 4.32 MIL/uL (ref 3.80–5.10)
RDW: 14.3 % (ref 11.0–15.0)
WBC: 5 10*3/uL (ref 3.8–10.8)

## 2015-12-25 LAB — PROTIME-INR
INR: 1
Prothrombin Time: 10.3 s (ref 9.0–11.5)

## 2015-12-26 ENCOUNTER — Encounter (HOSPITAL_COMMUNITY)
Admission: RE | Disposition: A | Discharge status: Home / Self Care | Payer: Self-pay | Source: Ambulatory Visit | Attending: Cardiovascular Disease

## 2015-12-26 ENCOUNTER — Encounter (HOSPITAL_COMMUNITY): Payer: Self-pay

## 2015-12-26 ENCOUNTER — Ambulatory Visit (HOSPITAL_COMMUNITY)
Admission: RE | Admit: 2015-12-26 | Discharge: 2015-12-26 | Disposition: A | Discharge status: Home / Self Care | Payer: Medicare Other | Source: Ambulatory Visit | Attending: Cardiovascular Disease | Admitting: Cardiovascular Disease

## 2015-12-26 DIAGNOSIS — M797 Fibromyalgia: Secondary | ICD-10-CM | POA: Diagnosis not present

## 2015-12-26 DIAGNOSIS — F419 Anxiety disorder, unspecified: Secondary | ICD-10-CM | POA: Diagnosis not present

## 2015-12-26 DIAGNOSIS — Z8614 Personal history of Methicillin resistant Staphylococcus aureus infection: Secondary | ICD-10-CM | POA: Insufficient documentation

## 2015-12-26 DIAGNOSIS — I10 Essential (primary) hypertension: Secondary | ICD-10-CM | POA: Diagnosis not present

## 2015-12-26 DIAGNOSIS — K859 Acute pancreatitis without necrosis or infection, unspecified: Secondary | ICD-10-CM | POA: Diagnosis not present

## 2015-12-26 DIAGNOSIS — Z7982 Long term (current) use of aspirin: Secondary | ICD-10-CM | POA: Diagnosis not present

## 2015-12-26 DIAGNOSIS — E663 Overweight: Secondary | ICD-10-CM | POA: Insufficient documentation

## 2015-12-26 DIAGNOSIS — I2510 Atherosclerotic heart disease of native coronary artery without angina pectoris: Secondary | ICD-10-CM | POA: Insufficient documentation

## 2015-12-26 DIAGNOSIS — Z85038 Personal history of other malignant neoplasm of large intestine: Secondary | ICD-10-CM | POA: Insufficient documentation

## 2015-12-26 DIAGNOSIS — M1990 Unspecified osteoarthritis, unspecified site: Secondary | ICD-10-CM | POA: Diagnosis not present

## 2015-12-26 DIAGNOSIS — R9439 Abnormal result of other cardiovascular function study: Secondary | ICD-10-CM | POA: Diagnosis present

## 2015-12-26 DIAGNOSIS — Z6828 Body mass index (BMI) 28.0-28.9, adult: Secondary | ICD-10-CM | POA: Insufficient documentation

## 2015-12-26 DIAGNOSIS — Z8249 Family history of ischemic heart disease and other diseases of the circulatory system: Secondary | ICD-10-CM | POA: Diagnosis not present

## 2015-12-26 DIAGNOSIS — K219 Gastro-esophageal reflux disease without esophagitis: Secondary | ICD-10-CM | POA: Diagnosis not present

## 2015-12-26 DIAGNOSIS — G2581 Restless legs syndrome: Secondary | ICD-10-CM | POA: Diagnosis not present

## 2015-12-26 DIAGNOSIS — Z833 Family history of diabetes mellitus: Secondary | ICD-10-CM | POA: Diagnosis not present

## 2015-12-26 DIAGNOSIS — R072 Precordial pain: Secondary | ICD-10-CM | POA: Diagnosis not present

## 2015-12-26 DIAGNOSIS — K589 Irritable bowel syndrome without diarrhea: Secondary | ICD-10-CM | POA: Insufficient documentation

## 2015-12-26 DIAGNOSIS — F329 Major depressive disorder, single episode, unspecified: Secondary | ICD-10-CM | POA: Diagnosis not present

## 2015-12-26 DIAGNOSIS — E785 Hyperlipidemia, unspecified: Secondary | ICD-10-CM | POA: Insufficient documentation

## 2015-12-26 DIAGNOSIS — Z823 Family history of stroke: Secondary | ICD-10-CM | POA: Diagnosis not present

## 2015-12-26 HISTORY — PX: CARDIAC CATHETERIZATION: SHX172

## 2015-12-26 SURGERY — LEFT HEART CATH AND CORONARY ANGIOGRAPHY

## 2015-12-26 MED ORDER — SODIUM CHLORIDE 0.9% FLUSH: 3.0000 mL | Freq: Two times a day (BID) | INTRAVENOUS | Status: DC

## 2015-12-26 MED ORDER — SODIUM CHLORIDE 0.9 % WEIGHT BASED INFUSION: 3.0000 mL/kg/h | INTRAVENOUS | Status: AC

## 2015-12-26 MED ORDER — METOPROLOL TARTRATE 25 MG PO TABS: 25.0000 mg | ORAL_TABLET | Freq: Two times a day (BID) | ORAL | 11 refills | Status: DC

## 2015-12-26 MED ORDER — ASPIRIN 81 MG PO CHEW: 81.0000 mg | CHEWABLE_TABLET | Freq: Every day | ORAL | Status: DC

## 2015-12-26 MED ORDER — DIAZEPAM 5 MG PO TABS: 5.0000 mg | ORAL_TABLET | ORAL | Status: DC | PRN

## 2015-12-26 MED ORDER — IOPAMIDOL (ISOVUE-370) INJECTION 76%
INTRAVENOUS | Status: DC | PRN
  Administered 2015-12-26: 80 mL via INTRA_ARTERIAL

## 2015-12-26 MED ORDER — MIDAZOLAM HCL 2 MG/2ML IJ SOLN
INTRAMUSCULAR | Status: AC
  Filled 2015-12-26: qty 2

## 2015-12-26 MED ORDER — SODIUM CHLORIDE 0.9 % WEIGHT BASED INFUSION
3.0000 mL/kg/h | INTRAVENOUS | Status: AC
  Administered 2015-12-26: 3 mL/kg/h via INTRAVENOUS

## 2015-12-26 MED ORDER — FENTANYL CITRATE (PF) 100 MCG/2ML IJ SOLN
INTRAMUSCULAR | Status: DC | PRN
  Administered 2015-12-26: 50 ug via INTRAVENOUS

## 2015-12-26 MED ORDER — LIDOCAINE HCL (PF) 1 % IJ SOLN
INTRAMUSCULAR | Status: AC
  Filled 2015-12-26: qty 30

## 2015-12-26 MED ORDER — FENTANYL CITRATE (PF) 100 MCG/2ML IJ SOLN
INTRAMUSCULAR | Status: AC
  Filled 2015-12-26: qty 2

## 2015-12-26 MED ORDER — VERAPAMIL HCL 2.5 MG/ML IV SOLN
INTRAVENOUS | Status: DC | PRN
  Administered 2015-12-26: 10 mL via INTRA_ARTERIAL

## 2015-12-26 MED ORDER — ISOSORBIDE MONONITRATE ER 30 MG PO TB24: 30.0000 mg | ORAL_TABLET | Freq: Every day | ORAL | 11 refills | Status: DC

## 2015-12-26 MED ORDER — SODIUM CHLORIDE 0.9 % IV SOLN: 250.0000 mL | INTRAVENOUS | Status: DC | PRN

## 2015-12-26 MED ORDER — SODIUM CHLORIDE 0.9% FLUSH: 3.0000 mL | INTRAVENOUS | Status: DC | PRN

## 2015-12-26 MED ORDER — HEPARIN SODIUM (PORCINE) 1000 UNIT/ML IJ SOLN
INTRAMUSCULAR | Status: AC
  Filled 2015-12-26: qty 1

## 2015-12-26 MED ORDER — ASPIRIN 81 MG PO CHEW: 81.0000 mg | CHEWABLE_TABLET | ORAL | Status: DC

## 2015-12-26 MED ORDER — HEPARIN SODIUM (PORCINE) 1000 UNIT/ML IJ SOLN
INTRAMUSCULAR | Status: DC | PRN
  Administered 2015-12-26: 4000 [IU] via INTRAVENOUS

## 2015-12-26 MED ORDER — VERAPAMIL HCL 2.5 MG/ML IV SOLN
INTRAVENOUS | Status: AC
  Filled 2015-12-26: qty 2

## 2015-12-26 MED ORDER — SODIUM CHLORIDE 0.9 % WEIGHT BASED INFUSION: 1.0000 mL/kg/h | INTRAVENOUS | Status: DC

## 2015-12-26 MED ORDER — ACETAMINOPHEN 325 MG PO TABS: 650.0000 mg | ORAL_TABLET | ORAL | Status: DC | PRN

## 2015-12-26 MED ORDER — ROSUVASTATIN CALCIUM 40 MG PO TABS: 40.0000 mg | ORAL_TABLET | Freq: Every day | ORAL | 11 refills | Status: DC

## 2015-12-26 MED ORDER — MIDAZOLAM HCL 2 MG/2ML IJ SOLN
INTRAMUSCULAR | Status: DC | PRN
  Administered 2015-12-26: 2 mg via INTRAVENOUS

## 2015-12-26 MED ORDER — HEPARIN (PORCINE) IN NACL 2-0.9 UNIT/ML-% IJ SOLN
INTRAMUSCULAR | Status: DC | PRN
  Administered 2015-12-26: 1000 mL

## 2015-12-26 MED ORDER — NITROGLYCERIN 1 MG/10 ML FOR IR/CATH LAB
INTRA_ARTERIAL | Status: AC
  Filled 2015-12-26: qty 10

## 2015-12-26 MED ORDER — LIDOCAINE HCL (PF) 1 % IJ SOLN
INTRAMUSCULAR | Status: DC | PRN
  Administered 2015-12-26: 1 mL via INTRADERMAL

## 2015-12-26 MED ORDER — ONDANSETRON HCL 4 MG/2ML IJ SOLN
4.0000 mg | Freq: Four times a day (QID) | INTRAMUSCULAR | Status: DC | PRN
Start: 2015-12-26 — End: 2015-12-26

## 2015-12-26 MED ORDER — HEPARIN (PORCINE) IN NACL 2-0.9 UNIT/ML-% IJ SOLN
INTRAMUSCULAR | Status: AC
  Filled 2015-12-26: qty 1000

## 2015-12-26 MED ORDER — IOPAMIDOL (ISOVUE-370) INJECTION 76%
INTRAVENOUS | Status: AC
Start: 2015-12-26 — End: 2015-12-26
  Filled 2015-12-26: qty 100

## 2015-12-26 SURGICAL SUPPLY — 11 items
CATH INFINITI 5FR ANG PIGTAIL (CATHETERS) ×2 IMPLANT
CATH OPTITORQUE TIG 4.0 5F (CATHETERS) ×2 IMPLANT
DEVICE RAD COMP TR BAND LRG (VASCULAR PRODUCTS) ×2 IMPLANT
GLIDESHEATH SLEND SS 6F .021 (SHEATH) ×2 IMPLANT
KIT HEART LEFT (KITS) ×2 IMPLANT
PACK CARDIAC CATHETERIZATION (CUSTOM PROCEDURE TRAY) ×2 IMPLANT
SYR MEDRAD MARK V 150ML (SYRINGE) ×2 IMPLANT
TRANSDUCER W/STOPCOCK (MISCELLANEOUS) ×2 IMPLANT
TUBING CIL FLEX 10 FLL-RA (TUBING) ×2 IMPLANT
WIRE HI TORQ VERSACORE-J 145CM (WIRE) ×2 IMPLANT
WIRE SAFE-T 1.5MM-J .035X260CM (WIRE) ×2 IMPLANT

## 2015-12-26 NOTE — Discharge Instructions (Signed)
Radial Site Care °Refer to this sheet in the next few weeks. These instructions provide you with information about caring for yourself after your procedure. Your health care provider may also give you more specific instructions. Your treatment has been planned according to current medical practices, but problems sometimes occur. Call your health care provider if you have any problems or questions after your procedure. °WHAT TO EXPECT AFTER THE PROCEDURE °After your procedure, it is typical to have the following: °· Bruising at the radial site that usually fades within 1-2 weeks. °· Blood collecting in the tissue (hematoma) that may be painful to the touch. It should usually decrease in size and tenderness within 1-2 weeks. °HOME CARE INSTRUCTIONS °· Take medicines only as directed by your health care provider. °· You may shower 24-48 hours after the procedure or as directed by your health care provider. Remove the bandage (dressing) and gently wash the site with plain soap and water. Pat the area dry with a clean towel. Do not rub the site, because this may cause bleeding. °· Do not take baths, swim, or use a hot tub until your health care provider approves. °· Check your insertion site every day for redness, swelling, or drainage. °· Do not apply powder or lotion to the site. °· Do not flex or bend the affected arm for 24 hours or as directed by your health care provider. °· Do not push or pull heavy objects with the affected arm for 24 hours or as directed by your health care provider. °· Do not lift over 10 lb (4.5 kg) for 5 days after your procedure or as directed by your health care provider. °· Ask your health care provider when it is okay to: °¨ Return to work or school. °¨ Resume usual physical activities or sports. °¨ Resume sexual activity. °· Do not drive home if you are discharged the same day as the procedure. Have someone else drive you. °· You may drive 24 hours after the procedure unless otherwise  instructed by your health care provider. °· Do not operate machinery or power tools for 24 hours after the procedure. °· If your procedure was done as an outpatient procedure, which means that you went home the same day as your procedure, a responsible adult should be with you for the first 24 hours after you arrive home. °· Keep all follow-up visits as directed by your health care provider. This is important. °SEEK MEDICAL CARE IF: °· You have a fever. °· You have chills. °· You have increased bleeding from the radial site. Hold pressure on the site. °SEEK IMMEDIATE MEDICAL CARE IF: °· You have unusual pain at the radial site. °· You have redness, warmth, or swelling at the radial site. °· You have drainage (other than a small amount of blood on the dressing) from the radial site. °· The radial site is bleeding, and the bleeding does not stop after 30 minutes of holding steady pressure on the site. °· Your arm or hand becomes pale, cool, tingly, or numb. °  °This information is not intended to replace advice given to you by your health care provider. Make sure you discuss any questions you have with your health care provider. °  °Document Released: 06/21/2010 Document Revised: 06/09/2014 Document Reviewed: 12/05/2013 °Elsevier Interactive Patient Education ©2016 [employer] ° °

## 2015-12-26 NOTE — Progress Notes (Signed)
Spoke with pt husband, 2 daughters and 1 granddaughter regarding cath results.  Explained that patient has moderate CAD with a normal EF, medical therapy. The medical therapy will include low dose beta blocker and Imdur added to her medication regimen plus an increase in her Crestor.  The family mentioned that she eats very poorly, snacking on a regular basis and rarely eats full meals.  Discussed ways to change that.  Reviewed cath results with the patient as well, encouraged her to make healthier eating choices and increase her activity.  The family will explore ways to do this.  Follow-up as outpatient. Right radial cath site is without ecchymosis or hematoma, TR band still in place.  Rosaria Ferries, PA-C 12/26/2015 10:08 AM Beeper [phone]

## 2015-12-26 NOTE — Research (Signed)
[facility] Informed Consent   Subject Name: Angela [name]  Subject met inclusion and exclusion criteria.  The informed consent form, study requirements and expectations were reviewed with the subject and questions and concerns were addressed prior to the signing of the consent form.  The subject verbalized understanding of the trial requirements.  The subject agreed to participate in the [place]  trial and signed the informed consent.  The informed consent was obtained prior to performance of any protocol-specific procedures for the subject.  A copy of the signed informed consent was given to the subject and a copy was placed in the subject's medical record.  [name] 12/26/2015, 07:20 AM

## 2015-12-26 NOTE — H&P (View-Only) (Signed)
Cardiology Office Note    Date:  11/27/2015   ID:  Angela [name], DOB [birth year], MRN [id number]  PCP:  [name], MD  Cardiologist:  Ena Dawley, MD  Referring physician: [name], MD   Chief complain: Chest pain  History of Present Illness:  Angela [name] is a 70 y.o. female who is a delightful patient coming for concern of exertional chest pain. She is very active living with her granddaughter walking a lot and has noticed in the last year worsening dyspnea on exertion especially when walking up the stairs or carrying bags. This is associated with retrosternal heaviness that resolves at rest. She denies any palpitations or syncope. The patient has prior medical history of hyperlipidemia treated with rosuvastatin 10 mg daily the latest LDL was 124, she also has a history of hypertension treated with diltiazem and combination of telmisartan hydrochlorothiazide. She denies any lower extremity edema orthopnea or paroxysmal nocturnal dyspnea. She is worrying especially because she has very significant family history of premature coronary artery disease, specifically her mother who had first myocardial infarction at age of 82, father at age of 22, and her sister passed away at age of 52 of myocardial infarction. Her aunt died during cardiac catheterization. None of patient's relatives including patient herself been smokers.  Past Medical History  Diagnosis Date  . GERD (gastroesophageal reflux disease)   . Hypertension   . Fibromyalgia   . Tachycardia   . DJD (degenerative joint disease)   . Hyperlipidemia   . Hemorrhoids   . Over weight   . Cancer of sigmoid ([place])   . RLS (restless legs syndrome)   . Pancreatitis   . Depression (emotion)   . Anxiety   . IBS (irritable bowel syndrome)   . MRSA (methicillin resistant Staphylococcus aureus) septicemia ([place])     history of    Past Surgical History  Procedure Laterality Date  . Child birth      Current  Medications: Outpatient Prescriptions Prior to Visit  Medication Sig Dispense Refill  . AMITIZA 24 MCG capsule Take 1 capsule by mouth 2 (two) times daily.    Marland Kitchen aspirin EC 81 MG tablet Take 81 mg by mouth daily.    Marland Kitchen buPROPion (WELLBUTRIN XL) 150 MG 24 hr tablet Take two (2) tablets (300 mg total) by mouth daily in the morning and take one (1) tablet (150 mg total) by mouth daily at bedtime.    Marland Kitchen diltiazem (DILACOR XR) 240 MG 24 hr capsule Take 240 mg by mouth daily.    Marland Kitchen esomeprazole (NEXIUM) 40 MG capsule Take 40 mg by mouth 2 (two) times daily before a meal.    . fluticasone (FLONASE) 50 MCG/ACT nasal spray Place 2 sprays into both nostrils daily as needed for allergies or rhinitis.     Marland Kitchen LORazepam (ATIVAN) 2 MG tablet Take 2 mg by mouth 3 (three) times daily as needed for anxiety.    . Milnacipran HCl (SAVELLA) 100 MG TABS tablet Take 100 mg by mouth 2 (two) times daily.    Marland Kitchen omega-3 acid ethyl esters (LOVAZA) 1 g capsule Take 1 g by mouth 2 (two) times daily.    . ondansetron (ZOFRAN-ODT) 4 MG disintegrating tablet Take 4 mg by mouth every 6 (six) hours as needed for nausea or vomiting.    . Oxycodone HCl 10 MG TABS Take 10 mg by mouth every 6 (six) hours.    Marland Kitchen rOPINIRole (REQUIP) 1 MG tablet Take 1 mg by  mouth 2 (two) times daily. ONE TABLET  IN AM TWO TABLETS IN PM    . rosuvastatin (CRESTOR) 10 MG tablet Take 10 mg by mouth daily.    Marland Kitchen telmisartan-hydrochlorothiazide (MICARDIS HCT) 80-25 MG tablet Take 1 tablet by mouth daily.    Marland Kitchen tiZANidine (ZANAFLEX) 4 MG capsule Take 4 mg by mouth every 8 (eight) hours as needed for muscle spasms.    Marland Kitchen linaclotide (LINZESS) 145 MCG CAPS capsule Take 145 mcg by mouth daily before breakfast.     No facility-administered medications prior to visit.     Allergies:   Norvasc; Phenergan; and Timentin   Social History   Social History  . Marital Status: Married    Spouse Name: [name]  . Number of Children: [name]  . Years of Education: [name]   Social  History Main Topics  . Smoking status: Never Smoker   . Smokeless tobacco: None  . Alcohol Use: None  . Drug Use: None  . Sexual Activity: Not Asked   Other Topics Concern  . None   Social History Narrative     Family History:  The patient's family history includes Diabetes in her sister; Heart attack in her father; Heart disease in her mother; Hypertension in her mother and sister; Stroke in her father.   ROS:   Please see the history of present illness.    ROS All other systems reviewed and are negative.   PHYSICAL EXAM:   VS:  BP 152/76 mmHg  Pulse 79  Ht 5' 5.5" (1.664 m)  Wt 172 lb 3.2 oz (78.109 kg)  BMI 28.21 kg/m2   GEN: Well nourished, well developed, in no acute distress HEENT: normal Neck: no JVD, carotid bruits, or masses Cardiac: RRR; no murmurs, rubs, or gallops,no edema  Respiratory:  clear to auscultation bilaterally, normal work of breathing GI: soft, nontender, nondistended, + BS MS: no deformity or atrophy Skin: warm and dry, no rash Neuro:  Alert and Oriented x 3, Strength and sensation are intact Psych: euthymic mood, full affect  Wt Readings from Last 3 Encounters:  11/27/15 172 lb 3.2 oz (78.109 kg)      Studies/Labs Reviewed:   EKG:  EKG is ordered today.  The ekg ordered today demonstrates SR, normal ECG  Recent Labs: No results found for requested labs within last 365 days.   Lipid Panel No results found for: CHOL, TRIG, HDL, CHOLHDL, VLDL, LDLCALC, LDLDIRECT  Additional studies/ records that were reviewed today include:  Notes from primary care physician as described in history of present illness.    ASSESSMENT:    1. Precordial pain   2. Family history of early CAD   3. Hyperlipidemia   4. Essential hypertension      PLAN:  In order of problems listed above:  1. This patient has a significant family history of premature coronary artery disease on both mother and father side and in at least 4 immediate family members. She  has a typical exertional chest pain associated with dizziness and shortness of breath, she somewhat terrified of cardiac catheterization as her close neighbor and her aunt died during cardiac catheterization. We will schedule an exercise nuclear stress test. If any unclear results we will proceed with coronary CT to further evaluate the plaque especially that she is already and rosuvastatin and her LDL cholesterol remains elevated at 124. 2. Hyperlipidemia - as above 3. Hypertension - at this point I'm not going to change her medicine as this is the first  visit, however we'll reevaluate her blood pressure during the stress tests and response to exertion and address afterwards.   Medication Adjustments/Labs and Tests Ordered: Current medicines are reviewed at length with the patient today.  Concerns regarding medicines are outlined above.  Medication changes, Labs and Tests ordered today are listed in the Patient Instructions below. Patient Instructions  Medication Instructions:   Your physician recommends that you continue on your current medications as directed. Please refer to the Current Medication list given to you today.    Testing/Procedures:  Your physician has requested that you have en exercise stress myoview. For further information please visit [email or web address]. Please follow instruction sheet, as given.    Follow-Up:  AT DR Jarreau Callanan'S NEXT AVAILABLE APPOINTMENT       If you need a refill on your cardiac medications before your next appointment, please call your pharmacy.       Signed, Ena Dawley, MD  11/27/2015 5:05 PM    [facility] [address], [place], [place]  [address] Phone: [phone]; Fax: [phone]

## 2015-12-26 NOTE — Interval H&P Note (Signed)
Cath Lab Visit (complete for each Cath Lab visit)  Clinical Evaluation Leading to the Procedure:   ACS: No.  Non-ACS:    Anginal Classification: CCS III  Anti-ischemic medical therapy: Minimal Therapy (1 class of medications)  Non-Invasive Test Results: Intermediate-risk stress test findings: cardiac mortality 1-3%/year  Prior CABG: No previous CABG      History and Physical Interval Note:  12/26/2015 8:16 AM  Angela [name]  has presented today for surgery, with the diagnosis of abnormal stress  The various methods of treatment have been discussed with the patient and family. After consideration of risks, benefits and other options for treatment, the patient has consented to  Procedure(s): Left Heart Cath and Coronary Angiography ([name]) as a surgical intervention .  The patient's history has been reviewed, patient examined, no change in status, stable for surgery.  I have reviewed the patient's chart and labs.  Questions were answered to the patient's satisfaction.     Shelva Majestic

## 2015-12-27 ENCOUNTER — Encounter (HOSPITAL_COMMUNITY): Payer: Self-pay | Admitting: Cardiovascular Disease

## 2015-12-27 MED FILL — Nitroglycerin IV Soln 100 MCG/ML in D5W: INTRA_ARTERIAL | Qty: 10 | Status: AC

## 2015-12-28 ENCOUNTER — Encounter: Payer: Self-pay | Admitting: *Deleted

## 2016-01-03 DIAGNOSIS — M542 Cervicalgia: Secondary | ICD-10-CM | POA: Diagnosis not present

## 2016-01-03 DIAGNOSIS — M5030 Other cervical disc degeneration, unspecified cervical region: Secondary | ICD-10-CM | POA: Diagnosis not present

## 2016-01-03 DIAGNOSIS — M5412 Radiculopathy, cervical region: Secondary | ICD-10-CM | POA: Diagnosis not present

## 2016-01-07 NOTE — Telephone Encounter (Signed)
Pt received stress test results via phone on 7/12 with clear understanding.

## 2016-01-08 ENCOUNTER — Encounter: Payer: Self-pay | Admitting: Physician Assistant

## 2016-01-08 ENCOUNTER — Ambulatory Visit (INDEPENDENT_AMBULATORY_CARE_PROVIDER_SITE_OTHER): Payer: Medicare Other | Admitting: Physician Assistant

## 2016-01-08 ENCOUNTER — Encounter (INDEPENDENT_AMBULATORY_CARE_PROVIDER_SITE_OTHER): Payer: Self-pay

## 2016-01-08 VITALS — BP 140/82 | HR 84 | Ht 65.5 in | Wt 169.8 lb

## 2016-01-08 DIAGNOSIS — I10 Essential (primary) hypertension: Secondary | ICD-10-CM | POA: Diagnosis not present

## 2016-01-08 DIAGNOSIS — E785 Hyperlipidemia, unspecified: Secondary | ICD-10-CM

## 2016-01-08 DIAGNOSIS — I25118 Atherosclerotic heart disease of native coronary artery with other forms of angina pectoris: Secondary | ICD-10-CM | POA: Diagnosis not present

## 2016-01-08 DIAGNOSIS — I2510 Atherosclerotic heart disease of native coronary artery without angina pectoris: Secondary | ICD-10-CM | POA: Insufficient documentation

## 2016-01-08 MED ORDER — METOPROLOL TARTRATE 25 MG PO TABS: 25.0000 mg | ORAL_TABLET | Freq: Two times a day (BID) | ORAL | 3 refills | Status: DC

## 2016-01-08 MED ORDER — ROSUVASTATIN CALCIUM 40 MG PO TABS: 40.0000 mg | ORAL_TABLET | Freq: Every day | ORAL | 3 refills | Status: DC

## 2016-01-08 MED ORDER — ISOSORBIDE MONONITRATE ER 30 MG PO TB24: 30.0000 mg | ORAL_TABLET | Freq: Every day | ORAL | 3 refills | Status: DC

## 2016-01-08 MED ORDER — DILTIAZEM HCL ER 240 MG PO CP24: 240.0000 mg | ORAL_CAPSULE | Freq: Every day | ORAL | 3 refills | Status: DC

## 2016-01-08 MED ORDER — NITROGLYCERIN 0.4 MG SL SUBL: 0.4000 mg | SUBLINGUAL_TABLET | SUBLINGUAL | 3 refills | Status: DC | PRN

## 2016-01-08 MED ORDER — LOSARTAN POTASSIUM 25 MG PO TABS
25.0000 mg | ORAL_TABLET | Freq: Every day | ORAL | 3 refills | Status: DC
Start: 2016-01-08 — End: 2017-01-16

## 2016-01-08 NOTE — Patient Instructions (Addendum)
Medication Instructions:  We have sent your Nitroglycerin into your local pharmacy and the rest was sent into [facility].   Labwork: 02/19/16:  FASTING LIPID & LFT  Testing/Procedures: None ordered  Follow-Up: Your physician recommends that you schedule a follow-up appointment with:  DR. [name] in 2 MONTHS   Any Other Special Instructions Will Be Listed Below (If Applicable).    If you need a refill on your cardiac medications before your next appointment, please call your pharmacy.

## 2016-01-08 NOTE — Progress Notes (Signed)
Cardiology Office Note    Date:  01/08/2016   ID:  Angela [name], DOB 15-Nov-1945, MRN [id number]  PCP:  [name], MD  Cardiologist: Dr. [name]  No chief complaint on file.   History of Present Illness:  Angela [name] is a 70 y.o. female with history of hypertension, hyperlipidemia and strong family history of early CAD. Patient had exertional chest pain and underwent stress testing that was abnormal for possible ischemia in the inferior to inferolateral region. Cardiac catheterization revealed mild to moderate CAD with 30 and 40% proximal mid LAD stenosis and 70-75% ostial first diagonal, normal circumflex and smooth 25% mid RCA and a dominant RCA vessel, EF 55-65%. Nitrates in low-dose beta blockers were added to her medications.  Patient comes in today accompanied by her husband. They have many questions because Dr. Claiborne Billings had an emergency after her heart cath and could not explain the results. I spent a lot of time reviewing the nuclear stress test and cardiac catheterization.   Yesterday while laying down she had a chest pressure that lasted a few seconds. No radiation, shortness of breath, dizziness or presyncope. She doesn't think it was GI related. She was not given nitroglycerin. She feels fatigued and overall does not feel well in general. Mild exertional chest tightness but she hasn't done much.    Past Medical History:  Diagnosis Date  . Anxiety   . Cancer of sigmoid ([place])   . Depression (emotion)   . DJD (degenerative joint disease)   . Fibromyalgia   . GERD (gastroesophageal reflux disease)   . Hemorrhoids   . Hyperlipidemia   . Hypertension   . IBS (irritable bowel syndrome)   . MRSA (methicillin resistant Staphylococcus aureus) septicemia ([place])    history of  . Over weight   . Pancreatitis   . RLS (restless legs syndrome)   . Tachycardia     Past Surgical History:  Procedure Laterality Date  . CARDIAC CATHETERIZATION [name] 12/26/2015   Procedure: Left  Heart Cath and Coronary Angiography;  Surgeon: Troy Sine, MD;  Location: [facility];  Service: Cardiovascular;  Laterality: [name];  . child birth      Current Medications: Outpatient Medications Prior to Visit  Medication Sig Dispense Refill  . AMITIZA 24 MCG capsule Take 1 capsule by mouth 2 (two) times daily.    Marland Kitchen aspirin EC 81 MG tablet Take 81 mg by mouth daily.    Marland Kitchen buPROPion (WELLBUTRIN XL) 150 MG 24 hr tablet Take two (2) tablets (300 mg total) by mouth daily in the morning and take one (1) tablet (150 mg total) by mouth daily at bedtime.    Marland Kitchen esomeprazole (NEXIUM) 40 MG capsule Take 40 mg by mouth 2 (two) times daily before a meal.    . fluticasone (FLONASE) 50 MCG/ACT nasal spray Place 2 sprays into both nostrils daily as needed for allergies or rhinitis.     Marland Kitchen LORazepam (ATIVAN) 2 MG tablet Take 2 mg by mouth at bedtime.     . Milnacipran HCl (SAVELLA) 100 MG TABS tablet Take 100 mg by mouth 2 (two) times daily.    Marland Kitchen omega-3 acid ethyl esters (LOVAZA) 1 g capsule Take 1 g by mouth 2 (two) times daily.    . ondansetron (ZOFRAN-ODT) 4 MG disintegrating tablet Take 4 mg by mouth every 6 (six) hours as needed for nausea or vomiting.    . Oxycodone HCl 10 MG TABS Take 10 mg by mouth every  6 (six) hours.    Marland Kitchen rOPINIRole (REQUIP) 1 MG tablet Take 1 mg by mouth 2 (two) times daily. ONE TABLET  IN AM TWO TABLETS IN PM    . tiZANidine (ZANAFLEX) 4 MG capsule Take 4 mg by mouth every 8 (eight) hours as needed for muscle spasms.    Marland Kitchen diltiazem (DILACOR XR) 240 MG 24 hr capsule Take 240 mg by mouth daily.    . isosorbide mononitrate (IMDUR) 30 MG 24 hr tablet Take 1 tablet (30 mg total) by mouth daily. 30 tablet 11  . losartan (COZAAR) 25 MG tablet Take 1 tablet (25 mg total) by mouth daily. 90 tablet 3  . metoprolol tartrate (LOPRESSOR) 25 MG tablet Take 1 tablet (25 mg total) by mouth 2 (two) times daily. 60 tablet 11  . rosuvastatin (CRESTOR) 40 MG tablet Take 1 tablet (40 mg total)  by mouth daily. 30 tablet 11   No facility-administered medications prior to visit.      Allergies:   Norvasc [amlodipine besylate]; Phenergan [promethazine]; and Timentin [ticarcillin-pot clavulanate]   Social History   Social History  . Marital status: Married    Spouse name: [name]  . Number of children: [name]  . Years of education: [name]   Social History Main Topics  . Smoking status: Never Smoker  . Smokeless tobacco: None  . Alcohol use None  . Drug use: Unknown  . Sexual activity: Not Asked   Other Topics Concern  . None   Social History Narrative  . None     Family History:  The patient's family history includes Diabetes in her sister; Heart attack in her father; Heart disease in her mother; Hypertension in her mother and sister; Stroke in her father.   ROS:   Please see the history of present illness.    Review of Systems  Constitution: Negative.  HENT: Negative.   Eyes: Negative.   Cardiovascular: Positive for chest pain.  Respiratory: Negative.   Hematologic/Lymphatic: Negative.   Musculoskeletal: Negative.  Negative for joint pain.  Gastrointestinal: Negative.   Genitourinary: Negative.   Neurological: Negative.    All other systems reviewed and are negative.   PHYSICAL EXAM:   VS:  BP 140/82   Pulse 84   Ht 5' 5.5" (1.664 m)   Wt 169 lb 12.8 oz (77 kg)   BMI 27.83 kg/m   Physical Exam  GEN: Well nourished, well developed, in no acute distress  Neck: no JVD, carotid bruits, or masses Cardiac:RRR; no murmurs, rubs, or gallops  Respiratory:  clear to auscultation bilaterally, normal work of breathing GI: soft, nontender, nondistended, + BS Ext: Right arm at cath site without hematoma or hemorrhage, good radial and brachial pulse, lower extremities without cyanosis, clubbing, or edema, Good distal pulses bilaterally Skin: warm and dry, no rash Neuro:  Alert and Oriented x 3, Strength and sensation are intact Psych: euthymic mood, full affect  Wt  Readings from Last 3 Encounters:  01/08/16 169 lb 12.8 oz (77 kg)  12/26/15 170 lb (77.1 kg)  12/05/15 172 lb (78 kg)      Studies/Labs Reviewed:   EKG:  EKG is Not ordered today.   Recent Labs: 12/24/2015: ALT 18; BUN 18; Creat 1.06; Hemoglobin 12.5; Platelets 278; Potassium 4.2; Sodium 142   Lipid Panel No results found for: CHOL, TRIG, HDL, CHOLHDL, VLDL, LDLCALC, LDLDIRECT  Additional studies/ records that were reviewed today include:   Nuclear stress test 12/05/15 Study Highlights   Nuclear stress EF: 74%.  Blood pressure demonstrated a hypertensive response to exercise.  There was no ST segment deviation noted during stress.  There is a medium defect of mild severity present in the basal inferior, basal inferolateral, mid inferior and mid inferolateral location. The defect is reversible. This is a mild defect and could be explained by changes in diaphragmatic attenuation artifact but cannot rule out an area of ischemia.  This is an intermediate risk study.  The left ventricular ejection fraction is hyperdynamic (>65%).    Cardiac catheterization 12/26/15 Conclusion     Prox RCA lesion, 25 %stenosed.  Ost 1st Diag lesion, 70 %stenosed.  Prox LAD lesion, 40 %stenosed.  Ost LAD lesion, 30 %stenosed.  The left ventricular systolic function is normal.  LV end diastolic pressure is normal.  The left ventricular ejection fraction is 55-65% by visual estimate.   Normal to hyperdynamic LV function with an ejection fraction of 65%.   Mild-to-moderate CAD with 30 and 40% proximal and mid LAD stenoses with 70-75% ostial stenosis in the first diagonal branch of the LAD; normal left circumflex coronary artery; and smooth 25% mid RCA stenosis in a dominant RCA vessel.   RECOMMENDATION: An initial increased initial medical therapy trial is recommended.  On the patient's recent nuclear perfusion study, she was found to have possible ischemia in the inferior to inferolateral  region and had relatively normal perfusion in the distribution of her stenosis involving the ostium of the diagonal vessel.  Nitrates and low-dose beta blocker will be added to her medical regimen.  In addition, Crestor will be titrated to 40 mg in attempt to be aggressive and induced potential plaque regression.  The patient will follow-up with Dr. [name].         ASSESSMENT:    1. Hyperlipidemia   2. Essential hypertension   3. Coronary artery disease involving native coronary artery of native heart with other form of angina pectoris ([place])      PLAN:  In order of problems listed above:  Hyperlipidemia Crestor increased to 40 mg daily in the hospital we'll check fasting lipid panel and LFTs in 6 weeks  Hypertension controlled  CAD patient's cardiac catheterization revealed nonobstructive CAD and not in the territory where stress test was abnormal in the inferior inferolateral region. Recommend medical therapy. If she continues to have angina will increase Imdur or to 60 mg daily. She is having some fatigue. She is on metoprolol and diltiazem. May need to back off on one of these drugs. Heart rate is 84 today so we will not change today.    Medication Adjustments/Labs and Tests Ordered: Current medicines are reviewed at length with the patient today.  Concerns regarding medicines are outlined above.  Medication changes, Labs and Tests ordered today are listed in the Patient Instructions below. Patient Instructions  Medication Instructions:  We have sent your Nitroglycerin into your local pharmacy and the rest was sent into [facility].   Labwork: 02/19/16:  FASTING LIPID & LFT  Testing/Procedures: None ordered  Follow-Up: Your physician recommends that you schedule a follow-up appointment with:  DR. [name] in 2 MONTHS   Any Other Special Instructions Will Be Listed Below (If Applicable).    If you need a refill on your cardiac medications before your next appointment,  please call your pharmacy.      Sumner Boast, PA-C  01/08/2016 9:31 AM    [facility] [place], [place], [place]  [address] Phone: [phone]; Fax: [phone]    

## 2016-01-31 DIAGNOSIS — Z79891 Long term (current) use of opiate analgesic: Secondary | ICD-10-CM | POA: Diagnosis not present

## 2016-01-31 DIAGNOSIS — M5030 Other cervical disc degeneration, unspecified cervical region: Secondary | ICD-10-CM | POA: Diagnosis not present

## 2016-01-31 DIAGNOSIS — M542 Cervicalgia: Secondary | ICD-10-CM | POA: Diagnosis not present

## 2016-01-31 DIAGNOSIS — M5412 Radiculopathy, cervical region: Secondary | ICD-10-CM | POA: Diagnosis not present

## 2016-01-31 DIAGNOSIS — Z79899 Other long term (current) drug therapy: Secondary | ICD-10-CM | POA: Diagnosis not present

## 2016-01-31 DIAGNOSIS — G894 Chronic pain syndrome: Secondary | ICD-10-CM | POA: Diagnosis not present

## 2016-02-19 ENCOUNTER — Other Ambulatory Visit: Payer: Medicare Other

## 2016-02-20 ENCOUNTER — Other Ambulatory Visit: Payer: Self-pay | Admitting: Physician Assistant

## 2016-02-20 ENCOUNTER — Encounter: Payer: Self-pay | Admitting: Cardiology

## 2016-02-20 ENCOUNTER — Other Ambulatory Visit: Payer: Medicare Other | Admitting: *Deleted

## 2016-02-20 DIAGNOSIS — I10 Essential (primary) hypertension: Secondary | ICD-10-CM | POA: Diagnosis not present

## 2016-02-20 DIAGNOSIS — E785 Hyperlipidemia, unspecified: Secondary | ICD-10-CM

## 2016-02-20 DIAGNOSIS — M7918 Myalgia, other site: Secondary | ICD-10-CM

## 2016-02-20 LAB — HEPATIC FUNCTION PANEL
ALT: 20 U/L (ref 6–29)
AST: 17 U/L (ref 10–35)
Albumin: 4.2 g/dL (ref 3.6–5.1)
Alkaline Phosphatase: 84 U/L (ref 33–130)
BILIRUBIN INDIRECT: 0.4 mg/dL (ref 0.2–1.2)
Bilirubin, Direct: 0.1 mg/dL (ref <=0.2)
TOTAL PROTEIN: 6.6 g/dL (ref 6.1–8.1)
Total Bilirubin: 0.5 mg/dL (ref 0.2–1.2)

## 2016-02-20 LAB — LIPID PANEL
CHOL/HDL RATIO: 3 ratio (ref <=5.0)
CHOLESTEROL: 150 mg/dL (ref 125–200)
HDL: 50 mg/dL (ref >=46)
LDL Cholesterol: 78 mg/dL (ref <130)
TRIGLYCERIDES: 112 mg/dL (ref <150)
VLDL: 22 mg/dL (ref <30)

## 2016-02-20 MED ORDER — ROSUVASTATIN CALCIUM 10 MG PO TABS: 10.0000 mg | ORAL_TABLET | Freq: Every day | ORAL | 0 refills | Status: DC

## 2016-02-20 NOTE — Telephone Encounter (Signed)
Angela [name],  Please instruct her to go back to 10 mg po daily and have her come for CMP and CK-MB.  Thank you,  Houston Siren   Notified the pt that per Dr [name], she reviewed her mychart message from earlier, and recommends that we decrease her crestor back down to 10 mg po daily, and have her come in for lab to check a cmet and CK. Per the pt, she states she came in for lab today and had lipids and LFTs done. Informed the pt that I can call the lab, and have them add on a BMET and CK to her current labs drawn today.  [facility] [employer] and provided them with the verbal order to add a bmet and a CK to this pts labs that were collected in our office today.  Per the tech at [facility], they cannot perform a CKMB, that this would need to be ordered as a CK, due to harvest.   Order changed to a CK and BMET.  Tech verbalized understanding of verbal lab orders given.  Confirmed the pharmacy of choice with the pt.  Pt request just a month supply to be sent to her local pharmacy of choice, then will call back when her supply runs low, to switch over to [facility] thereafter.  Pt verbalized understanding and agrees with this plan.

## 2016-02-21 LAB — BASIC METABOLIC PANEL
BUN: 15 mg/dL (ref 7–25)
CALCIUM: 9.5 mg/dL (ref 8.6–10.4)
CO2: 27 mmol/L (ref 20–31)
CREATININE: 0.96 mg/dL (ref 0.50–0.99)
Chloride: 105 mmol/L (ref 98–110)
GLUCOSE: 104 mg/dL — AB (ref 65–99)
Potassium: 4.1 mmol/L (ref 3.5–5.3)
Sodium: 145 mmol/L (ref 135–146)

## 2016-02-21 LAB — CK: Total CK: 128 U/L (ref 7–177)

## 2016-02-22 ENCOUNTER — Encounter: Payer: Self-pay | Admitting: Cardiology

## 2016-02-28 DIAGNOSIS — M5030 Other cervical disc degeneration, unspecified cervical region: Secondary | ICD-10-CM | POA: Diagnosis not present

## 2016-02-28 DIAGNOSIS — M5412 Radiculopathy, cervical region: Secondary | ICD-10-CM | POA: Diagnosis not present

## 2016-02-28 DIAGNOSIS — M542 Cervicalgia: Secondary | ICD-10-CM | POA: Diagnosis not present

## 2016-03-11 ENCOUNTER — Ambulatory Visit (INDEPENDENT_AMBULATORY_CARE_PROVIDER_SITE_OTHER): Payer: Medicare Other | Admitting: Cardiology

## 2016-03-11 ENCOUNTER — Encounter: Payer: Self-pay | Admitting: Cardiology

## 2016-03-11 VITALS — BP 140/70 | HR 80 | Ht 65.5 in | Wt 176.0 lb

## 2016-03-11 DIAGNOSIS — I10 Essential (primary) hypertension: Secondary | ICD-10-CM | POA: Diagnosis not present

## 2016-03-11 DIAGNOSIS — R9439 Abnormal result of other cardiovascular function study: Secondary | ICD-10-CM

## 2016-03-11 DIAGNOSIS — I25118 Atherosclerotic heart disease of native coronary artery with other forms of angina pectoris: Secondary | ICD-10-CM | POA: Diagnosis not present

## 2016-03-11 DIAGNOSIS — I2510 Atherosclerotic heart disease of native coronary artery without angina pectoris: Secondary | ICD-10-CM

## 2016-03-11 DIAGNOSIS — I209 Angina pectoris, unspecified: Secondary | ICD-10-CM

## 2016-03-11 DIAGNOSIS — Z8249 Family history of ischemic heart disease and other diseases of the circulatory system: Secondary | ICD-10-CM

## 2016-03-11 DIAGNOSIS — E782 Mixed hyperlipidemia: Secondary | ICD-10-CM | POA: Diagnosis not present

## 2016-03-11 DIAGNOSIS — Z789 Other specified health status: Secondary | ICD-10-CM

## 2016-03-11 MED ORDER — ISOSORBIDE MONONITRATE ER 60 MG PO TB24: 60.0000 mg | ORAL_TABLET | Freq: Every day | ORAL | 3 refills | Status: DC

## 2016-03-11 NOTE — Patient Instructions (Signed)
Medication Instructions:   INCREASE YOUR IMDUR TO 60 MG ONCE DAILY    You have been referred to [place] OFFICE TO SEE [facility]-D    Follow-Up:  2 MONTHS WITH DR [name]    NEW PATIENT APPOINTMENT WITH [facility] IN [facility]      If you need a refill on your cardiac medications before your next appointment, please call your pharmacy.

## 2016-03-11 NOTE — Progress Notes (Signed)
Cardiology Office Note    Date:  03/11/2016   ID:  Angela [name], DOB 1946/01/08, MRN [id number]  PCP:  [name], MD  Cardiologist: Dr. [name]  Chief Complaint  Patient presents with  . Appointment    some chest discomfort.    History of Present Illness:  Angela [name] is a 70 y.o. female with history of hypertension, hyperlipidemia and strong family history of early CAD. Patient had exertional chest pain and underwent stress testing that was abnormal for possible ischemia in the inferior to inferolateral region. Cardiac catheterization revealed mild to moderate CAD with 30 and 40% proximal mid LAD stenosis and 70-75% ostial first diagonal, normal circumflex and smooth 25% mid RCA and a dominant RCA vessel, EF 55-65%. Nitrates in low-dose beta blockers were added to her medications.  03/11/2016 - 2 months follow-up, she states that overall her angina is slightly better, but she still gets retrosternal chest pain and fatigue with activities like carrying a basket of laundry. She just overall feels really tired. She has been compliant to her medicines, we tried to increase Crestor however she developed significant muscle pain and we had to cut back to 10 mg daily. No palpitations or syncope. No lower extremity edema orthopnea or paroxysmal nocturnal dyspnea.   Past Medical History:  Diagnosis Date  . Anxiety   . Cancer of sigmoid ([place])   . Depression (emotion)   . DJD (degenerative joint disease)   . Fibromyalgia   . GERD (gastroesophageal reflux disease)   . Hemorrhoids   . Hyperlipidemia   . Hypertension   . IBS (irritable bowel syndrome)   . MRSA (methicillin resistant Staphylococcus aureus) septicemia ([place])    history of  . Over weight   . Pancreatitis   . RLS (restless legs syndrome)   . Tachycardia     Past Surgical History:  Procedure Laterality Date  . CARDIAC CATHETERIZATION [name] 12/26/2015   Procedure: Left Heart Cath and Coronary Angiography;  Surgeon:  Troy Sine, MD;  Location: [facility];  Service: Cardiovascular;  Laterality: [name];  . child birth      Current Medications: Outpatient Medications Prior to Visit  Medication Sig Dispense Refill  . AMITIZA 24 MCG capsule Take 1 capsule by mouth 2 (two) times daily.    Marland Kitchen aspirin EC 81 MG tablet Take 81 mg by mouth daily.    Marland Kitchen buPROPion (WELLBUTRIN XL) 150 MG 24 hr tablet Take two (2) tablets (300 mg total) by mouth daily in the morning and take one (1) tablet (150 mg total) by mouth daily at bedtime.    Marland Kitchen diltiazem (DILACOR XR) 240 MG 24 hr capsule Take 1 capsule (240 mg total) by mouth daily. 90 capsule 3  . esomeprazole (NEXIUM) 40 MG capsule Take 40 mg by mouth 2 (two) times daily before a meal.    . fluticasone (FLONASE) 50 MCG/ACT nasal spray Place 2 sprays into both nostrils daily as needed for allergies or rhinitis.     Marland Kitchen LORazepam (ATIVAN) 2 MG tablet Take 2 mg by mouth at bedtime.     Marland Kitchen losartan (COZAAR) 25 MG tablet Take 1 tablet (25 mg total) by mouth daily. 90 tablet 3  . metoprolol tartrate (LOPRESSOR) 25 MG tablet Take 1 tablet (25 mg total) by mouth 2 (two) times daily. 180 tablet 3  . Milnacipran HCl (SAVELLA) 100 MG TABS tablet Take 100 mg by mouth 2 (two) times daily.    . nitroGLYCERIN (NITROSTAT) 0.4  MG SL tablet Place 1 tablet (0.4 mg total) under the tongue every 5 (five) minutes as needed for chest pain. 25 tablet 3  . omega-3 acid ethyl esters (LOVAZA) 1 g capsule Take 1 g by mouth 2 (two) times daily.    . ondansetron (ZOFRAN-ODT) 4 MG disintegrating tablet Take 4 mg by mouth every 6 (six) hours as needed for nausea or vomiting.    . Oxycodone HCl 10 MG TABS Take 10 mg by mouth every 6 (six) hours.    Marland Kitchen rOPINIRole (REQUIP) 1 MG tablet Take 1 mg by mouth 2 (two) times daily. ONE TABLET  IN AM TWO TABLETS IN PM    . rosuvastatin (CRESTOR) 10 MG tablet Take 1 tablet (10 mg total) by mouth daily. 30 tablet 0  . tiZANidine (ZANAFLEX) 4 MG capsule Take 4 mg by  mouth every 8 (eight) hours as needed for muscle spasms.    . isosorbide mononitrate (IMDUR) 30 MG 24 hr tablet Take 1 tablet (30 mg total) by mouth daily. 90 tablet 3   No facility-administered medications prior to visit.      Allergies:   Norvasc [amlodipine besylate]; Phenergan [promethazine]; and Timentin [ticarcillin-pot clavulanate]   Social History   Social History  . Marital status: Married    Spouse name: [name]  . Number of children: [name]  . Years of education: [name]   Social History Main Topics  . Smoking status: Never Smoker  . Smokeless tobacco: Never Used  . Alcohol use No  . Drug use: No  . Sexual activity: Not Asked   Other Topics Concern  . None   Social History Narrative  . None     Family History:  The patient's family history includes Diabetes in her sister; Heart attack in her father; Heart disease in her mother; Hypertension in her mother and sister; Stroke in her father.   ROS:   Please see the history of present illness.    Review of Systems  Constitution: Negative.  HENT: Negative.   Eyes: Negative.   Cardiovascular: Positive for chest pain.  Respiratory: Negative.   Hematologic/Lymphatic: Negative.   Musculoskeletal: Negative.  Negative for joint pain.  Gastrointestinal: Negative.   Genitourinary: Negative.   Neurological: Negative.    All other systems reviewed and are negative.   PHYSICAL EXAM:   VS:  BP 140/70   Pulse 80   Ht 5' 5.5" (1.664 m)   Wt 176 lb (79.8 kg)   BMI 28.84 kg/m   Physical Exam  GEN: Well nourished, well developed, in no acute distress  Neck: no JVD, carotid bruits, or masses Cardiac:RRR; no murmurs, rubs, or gallops  Respiratory:  clear to auscultation bilaterally, normal work of breathing GI: soft, nontender, nondistended, + BS Ext: Right arm at cath site without hematoma or hemorrhage, good radial and brachial pulse, lower extremities without cyanosis, clubbing, or edema, Good distal pulses bilaterally Skin:  warm and dry, no rash Neuro:  Alert and Oriented x 3, Strength and sensation are intact Psych: euthymic mood, full affect  Wt Readings from Last 3 Encounters:  03/11/16 176 lb (79.8 kg)  01/08/16 169 lb 12.8 oz (77 kg)  12/26/15 170 lb (77.1 kg)      Studies/Labs Reviewed:   EKG:  EKG is Not ordered today.   Recent Labs: 12/24/2015: Hemoglobin 12.5; Platelets 278 02/20/2016: ALT 20; BUN 15; Creat 0.96; Potassium 4.1; Sodium 145   Lipid Panel    Component Value Date/Time   CHOL 150 02/20/2016  1205   TRIG 112 02/20/2016 1205   HDL 50 02/20/2016 1205   CHOLHDL 3.0 02/20/2016 1205   VLDL 22 02/20/2016 1205   LDLCALC 78 02/20/2016 1205    Additional studies/ records that were reviewed today include:   Nuclear stress test 12/05/15 Study Highlights   Nuclear stress EF: 74%.  Blood pressure demonstrated a hypertensive response to exercise.  There was no ST segment deviation noted during stress.  There is a medium defect of mild severity present in the basal inferior, basal inferolateral, mid inferior and mid inferolateral location. The defect is reversible. This is a mild defect and could be explained by changes in diaphragmatic attenuation artifact but cannot rule out an area of ischemia.  This is an intermediate risk study.  The left ventricular ejection fraction is hyperdynamic (>65%).    Cardiac catheterization 12/26/15 Conclusion     Prox RCA lesion, 25 %stenosed.  Ost 1st Diag lesion, 70 %stenosed.  Prox LAD lesion, 40 %stenosed.  Ost LAD lesion, 30 %stenosed.  The left ventricular systolic function is normal.  LV end diastolic pressure is normal.  The left ventricular ejection fraction is 55-65% by visual estimate.   Normal to hyperdynamic LV function with an ejection fraction of 65%.   Mild-to-moderate CAD with 30 and 40% proximal and mid LAD stenoses with 70-75% ostial stenosis in the first diagonal branch of the LAD; normal left circumflex coronary  artery; and smooth 25% mid RCA stenosis in a dominant RCA vessel.   RECOMMENDATION: An initial increased initial medical therapy trial is recommended.  On the patient's recent nuclear perfusion study, she was found to have possible ischemia in the inferior to inferolateral region and had relatively normal perfusion in the distribution of her stenosis involving the ostium of the diagonal vessel.  Nitrates and low-dose beta blocker will be added to her medical regimen.  In addition, Crestor will be titrated to 40 mg in attempt to be aggressive and induced potential plaque regression.  The patient will follow-up with Dr. [name].         ASSESSMENT:    1. Mixed hyperlipidemia   2. Family history of early CAD   3. Coronary artery disease involving native coronary artery of native heart with other form of angina pectoris ([place])   4. Essential hypertension   5. Abnormal stress test   6. Coronary artery disease involving native coronary artery of native heart without angina pectoris   7. Statin intolerance     PLAN:  In order of problems listed above:  I have discussed this case with Dr. Tamala Julian who reviewed her cardiac cath images and stated that her first diagonal vessel is very small and might be difficult to stent. Also because of its ostial lesion it can cause damage to the adjacent LAD and he would also recommend just medical therapy. Patient agrees as she has high anxiety from procedures as two  of her friends died after cardiac catheterizations.  We will increase Imdur to 60 mg daily. We will refer to lipid clinic for consideration of PCS K9 inhibitors as she can't tolerate any other statin and Crestor and only 10 mg daily.   Medication Adjustments/Labs and Tests Ordered: Current medicines are reviewed at length with the patient today.  Concerns regarding medicines are outlined above.  Medication changes, Labs and Tests ordered today are listed in the Patient Instructions below. Patient  Instructions  Medication Instructions:   INCREASE YOUR IMDUR TO 60 MG ONCE DAILY  You have been referred to [facility] OFFICE TO SEE [facility]    Follow-Up:  2 MONTHS WITH DR [name]    NEW PATIENT APPOINTMENT WITH [facility]PPLE PHARMACIST IN LIPID CLINIC      If you need a refill on your cardiac medications before your next appointment, please call your pharmacy.      Signed, Ena Dawley, MD  03/11/2016 10:56 AM    [address], [place],   [address] Phone: [phone]; Fax: [phone]

## 2016-03-13 DIAGNOSIS — Z23 Encounter for immunization: Secondary | ICD-10-CM | POA: Diagnosis not present

## 2016-03-13 DIAGNOSIS — K219 Gastro-esophageal reflux disease without esophagitis: Secondary | ICD-10-CM | POA: Diagnosis not present

## 2016-03-13 DIAGNOSIS — R0602 Shortness of breath: Secondary | ICD-10-CM | POA: Diagnosis not present

## 2016-03-13 DIAGNOSIS — E785 Hyperlipidemia, unspecified: Secondary | ICD-10-CM | POA: Diagnosis not present

## 2016-03-13 DIAGNOSIS — I10 Essential (primary) hypertension: Secondary | ICD-10-CM | POA: Diagnosis not present

## 2016-03-13 DIAGNOSIS — F329 Major depressive disorder, single episode, unspecified: Secondary | ICD-10-CM | POA: Diagnosis not present

## 2016-03-20 ENCOUNTER — Ambulatory Visit: Payer: Medicare Other

## 2016-03-28 DIAGNOSIS — M47812 Spondylosis without myelopathy or radiculopathy, cervical region: Secondary | ICD-10-CM | POA: Diagnosis not present

## 2016-03-28 DIAGNOSIS — Z79891 Long term (current) use of opiate analgesic: Secondary | ICD-10-CM | POA: Diagnosis not present

## 2016-03-28 DIAGNOSIS — Z79899 Other long term (current) drug therapy: Secondary | ICD-10-CM | POA: Diagnosis not present

## 2016-03-28 DIAGNOSIS — M5412 Radiculopathy, cervical region: Secondary | ICD-10-CM | POA: Diagnosis not present

## 2016-03-28 DIAGNOSIS — M5030 Other cervical disc degeneration, unspecified cervical region: Secondary | ICD-10-CM | POA: Diagnosis not present

## 2016-03-28 DIAGNOSIS — G894 Chronic pain syndrome: Secondary | ICD-10-CM | POA: Diagnosis not present

## 2016-04-10 ENCOUNTER — Ambulatory Visit: Payer: Medicare Other

## 2016-04-15 DIAGNOSIS — Z1231 Encounter for screening mammogram for malignant neoplasm of breast: Secondary | ICD-10-CM | POA: Diagnosis not present

## 2016-04-16 NOTE — Progress Notes (Signed)
Patient ID: Angela [name]                 DOB: 12/20/1945                    MRN: [id number]     HPI: Angela [name] patient referred to lipid clinic by Dr [name]. PMH is significant for HTN, HLD, CAD, angina, pancreatitis, obesity, IBS, and family history of early CAD. She had exertional chest pain and underwent stress testing that was abnormal for possible ischemia. Cardiac cath revealed mild to moderate CAD with 30-40% proximal mid LAD stenosis and 70-75% ostial first diagonal stenosis, LVEF 55-65%. She is previously intolerant to multiple statins but is currently tolerating Crestor 10mg  daily.  Pt reports was on Crestor 10mg  daily for years and was switched to Crestor 40mg  daily upon the findings during her cardiac cath. She experienced myalgias on the Crestor 40mg , so she was started back on Crestor 10mg  daily and is tolerating it well. She has never tried Crestor 20mg  daily or Zetia and is willing to try either one.   Current Medications: Crestor 10mg  daily, fish oil 2g daily Intolerances: Crestor 40mg  (myalgias) Risk Factors: CAD with up to 75% stenosis to be medically managed, family history of early CAD, HTN, obesity LDL goal: 70mg /dL   Diet: Minimal meat consumption, does snack frequently throughout the day  Exercise: Tries to walk with her husband. Would like to join [employer], but [employer] won't cover it  Family History: DM in her sister, MI in her father, heart disease in her mother, HTN in her mother and sister, stroke in her father.   Social History: Patient denies tobacco, alcohol, and illicit drug use.  Labs: 02/20/16: TC 150, TG 112, HDL 50, LDL 78 (Crestor 10mg  daily)  Past Medical History:  Diagnosis Date  . Anxiety   . Cancer of sigmoid ([place])   . Depression (emotion)   . DJD (degenerative joint disease)   . Fibromyalgia   . GERD (gastroesophageal reflux disease)   . Hemorrhoids   . Hyperlipidemia   . Hypertension   . IBS (irritable bowel  syndrome)   . MRSA (methicillin resistant Staphylococcus aureus) septicemia ([place])    history of  . Over weight   . Pancreatitis   . RLS (restless legs syndrome)   . Tachycardia     Current Outpatient Prescriptions on File Prior to Visit  Medication Sig Dispense Refill  . AMITIZA 24 MCG capsule Take 1 capsule by mouth 2 (two) times daily.    Marland Kitchen aspirin EC 81 MG tablet Take 81 mg by mouth daily.    Marland Kitchen buPROPion (WELLBUTRIN XL) 150 MG 24 hr tablet Take two (2) tablets (300 mg total) by mouth daily in the morning and take one (1) tablet (150 mg total) by mouth daily at bedtime.    Marland Kitchen diltiazem (DILACOR XR) 240 MG 24 hr capsule Take 1 capsule (240 mg total) by mouth daily. 90 capsule 3  . esomeprazole (NEXIUM) 40 MG capsule Take 40 mg by mouth 2 (two) times daily before a meal.    . fluticasone (FLONASE) 50 MCG/ACT nasal spray Place 2 sprays into both nostrils daily as needed for allergies or rhinitis.     Marland Kitchen isosorbide mononitrate (IMDUR) 60 MG 24 hr tablet Take 1 tablet (60 mg total) by mouth daily. 90 tablet 3  . LORazepam (ATIVAN) 2 MG tablet Take 2 mg by mouth at bedtime.     Marland Kitchen  losartan (COZAAR) 25 MG tablet Take 1 tablet (25 mg total) by mouth daily. 90 tablet 3  . metoprolol tartrate (LOPRESSOR) 25 MG tablet Take 1 tablet (25 mg total) by mouth 2 (two) times daily. 180 tablet 3  . Milnacipran HCl (SAVELLA) 100 MG TABS tablet Take 100 mg by mouth 2 (two) times daily.    . nitroGLYCERIN (NITROSTAT) 0.4 MG SL tablet Place 1 tablet (0.4 mg total) under the tongue every 5 (five) minutes as needed for chest pain. 25 tablet 3  . omega-3 acid ethyl esters (LOVAZA) 1 g capsule Take 1 g by mouth 2 (two) times daily.    . ondansetron (ZOFRAN-ODT) 4 MG disintegrating tablet Take 4 mg by mouth every 6 (six) hours as needed for nausea or vomiting.    . Oxycodone HCl 10 MG TABS Take 10 mg by mouth every 6 (six) hours.    Marland Kitchen rOPINIRole (REQUIP) 1 MG tablet Take 1 mg by mouth 2 (two) times daily. ONE TABLET   IN AM TWO TABLETS IN PM    . rosuvastatin (CRESTOR) 10 MG tablet Take 1 tablet (10 mg total) by mouth daily. 30 tablet 0  . tiZANidine (ZANAFLEX) 4 MG capsule Take 4 mg by mouth every 8 (eight) hours as needed for muscle spasms.     No current facility-administered medications on file prior to visit.     Allergies  Allergen Reactions  . Norvasc [Amlodipine Besylate] Swelling  . Phenergan [Promethazine] Anaphylaxis  . Timentin [Ticarcillin-Pot Clavulanate] Anaphylaxis    Has patient had a PCN reaction causing immediate rash, facial/tongue/throat swelling, SOB or lightheadedness with hypotension: Yes Has patient had a PCN reaction causing severe rash involving mucus membranes or skin necrosis: Yes Has patient had a PCN reaction that required hospitalization Yes Has patient had a PCN reaction occurring within the last 10 years: No If all of the above answers are "NO", then may proceed with Cephalosporin use.     Assessment/Plan:  1. Hyperlipidemia - Pt's recent LDL of 78 mg/dL is still above the ASCVD goal of <70 mg/dL. Will increase Crestor to 20 mg daily to bring LDL to goal. Advised pt to call clinic if she experiences any intolerances to increased Crestor dose. If she is intolerant, will decrease the dose back to 10 mg and add Zetia 10 mg daily. Encouraged her to continue with lifestyle improvements. Patient to follow-up in clinic in 2 months for a fasting lipid panel on same day as OV with Dr [name].  Patient signed informed consent for [facility] lipid registry.   Leeroy Bock, PharmD, [facility] [address], [place], [place] [address] Phone: [phone]; Fax: [phone] 04/17/2016 11:25 AM

## 2016-04-17 ENCOUNTER — Encounter: Payer: Self-pay | Admitting: *Deleted

## 2016-04-17 ENCOUNTER — Ambulatory Visit (INDEPENDENT_AMBULATORY_CARE_PROVIDER_SITE_OTHER): Payer: Medicare Other | Admitting: Pharmacist

## 2016-04-17 VITALS — BP 154/78 | HR 66 | Ht 65.5 in | Wt 176.5 lb

## 2016-04-17 DIAGNOSIS — Z006 Encounter for examination for normal comparison and control in clinical research program: Secondary | ICD-10-CM

## 2016-04-17 DIAGNOSIS — I2510 Atherosclerotic heart disease of native coronary artery without angina pectoris: Secondary | ICD-10-CM | POA: Diagnosis not present

## 2016-04-17 DIAGNOSIS — E785 Hyperlipidemia, unspecified: Secondary | ICD-10-CM | POA: Diagnosis not present

## 2016-04-17 MED ORDER — ROSUVASTATIN CALCIUM 20 MG PO TABS: 20.0000 mg | ORAL_TABLET | Freq: Every day | ORAL | 3 refills | Status: DC

## 2016-04-17 NOTE — Patient Instructions (Signed)
Start taking Crestor 20 mg daily to help lower LDL cholesterol. If you have 10 mg tablets remaining, you can take 2 tablets of the 10 mg daily until those are finished.   Call the clinic at [phone] if you experience any issues with your therapy.   Follow-up in clinic for a fasting lipid panel on 06/11/2016. You must fast for 8 hours.

## 2016-04-17 NOTE — Progress Notes (Signed)
Subject met inclusion and exclusion criteria. The informed consent form, study requirements and expectations were reviewed with the subject and questions and concerns were addressed prior to the signing of the consent form. The subject verbalized understanding of the trail requirements. The subject agreed to participate in the [place] Registryand signed the informed consent. The informed consent was obtained prior to performance of any protocol-specific procedures for the subject. A copy of the signed informed consent was given to the subject and a copy was placed in the subject's medical record.  Jake Bathe, RN 04/17/2016

## 2016-04-30 DIAGNOSIS — M5030 Other cervical disc degeneration, unspecified cervical region: Secondary | ICD-10-CM | POA: Diagnosis not present

## 2016-04-30 DIAGNOSIS — M542 Cervicalgia: Secondary | ICD-10-CM | POA: Diagnosis not present

## 2016-04-30 DIAGNOSIS — M5412 Radiculopathy, cervical region: Secondary | ICD-10-CM | POA: Diagnosis not present

## 2016-05-16 ENCOUNTER — Observation Stay (HOSPITAL_COMMUNITY): Payer: Medicare Other

## 2016-05-16 ENCOUNTER — Emergency Department (HOSPITAL_COMMUNITY): Payer: Medicare Other

## 2016-05-16 ENCOUNTER — Encounter (HOSPITAL_COMMUNITY): Payer: Self-pay

## 2016-05-16 ENCOUNTER — Inpatient Hospital Stay (HOSPITAL_COMMUNITY)
Admission: EM | Admit: 2016-05-16 | Discharge: 2016-05-19 | DRG: 684 | Disposition: A | Discharge status: Home / Self Care | Payer: Medicare Other | Attending: Internal Medicine | Admitting: Internal Medicine

## 2016-05-16 DIAGNOSIS — K760 Fatty (change of) liver, not elsewhere classified: Secondary | ICD-10-CM | POA: Diagnosis not present

## 2016-05-16 DIAGNOSIS — R112 Nausea with vomiting, unspecified: Secondary | ICD-10-CM | POA: Diagnosis not present

## 2016-05-16 DIAGNOSIS — Z823 Family history of stroke: Secondary | ICD-10-CM

## 2016-05-16 DIAGNOSIS — R1011 Right upper quadrant pain: Secondary | ICD-10-CM | POA: Diagnosis not present

## 2016-05-16 DIAGNOSIS — I2510 Atherosclerotic heart disease of native coronary artery without angina pectoris: Secondary | ICD-10-CM | POA: Diagnosis not present

## 2016-05-16 DIAGNOSIS — I25118 Atherosclerotic heart disease of native coronary artery with other forms of angina pectoris: Secondary | ICD-10-CM

## 2016-05-16 DIAGNOSIS — Z8249 Family history of ischemic heart disease and other diseases of the circulatory system: Secondary | ICD-10-CM

## 2016-05-16 DIAGNOSIS — E785 Hyperlipidemia, unspecified: Secondary | ICD-10-CM | POA: Diagnosis present

## 2016-05-16 DIAGNOSIS — R1013 Epigastric pain: Secondary | ICD-10-CM | POA: Diagnosis not present

## 2016-05-16 DIAGNOSIS — Z9049 Acquired absence of other specified parts of digestive tract: Secondary | ICD-10-CM

## 2016-05-16 DIAGNOSIS — G2581 Restless legs syndrome: Secondary | ICD-10-CM | POA: Diagnosis present

## 2016-05-16 DIAGNOSIS — Z7951 Long term (current) use of inhaled steroids: Secondary | ICD-10-CM

## 2016-05-16 DIAGNOSIS — Z833 Family history of diabetes mellitus: Secondary | ICD-10-CM

## 2016-05-16 DIAGNOSIS — Z7982 Long term (current) use of aspirin: Secondary | ICD-10-CM

## 2016-05-16 DIAGNOSIS — N179 Acute kidney failure, unspecified: Secondary | ICD-10-CM | POA: Diagnosis not present

## 2016-05-16 DIAGNOSIS — K219 Gastro-esophageal reflux disease without esophagitis: Secondary | ICD-10-CM | POA: Diagnosis present

## 2016-05-16 DIAGNOSIS — F329 Major depressive disorder, single episode, unspecified: Secondary | ICD-10-CM | POA: Diagnosis present

## 2016-05-16 DIAGNOSIS — E860 Dehydration: Secondary | ICD-10-CM | POA: Diagnosis present

## 2016-05-16 DIAGNOSIS — Z85038 Personal history of other malignant neoplasm of large intestine: Secondary | ICD-10-CM

## 2016-05-16 DIAGNOSIS — M797 Fibromyalgia: Secondary | ICD-10-CM | POA: Diagnosis not present

## 2016-05-16 DIAGNOSIS — I10 Essential (primary) hypertension: Secondary | ICD-10-CM | POA: Diagnosis present

## 2016-05-16 DIAGNOSIS — Z79891 Long term (current) use of opiate analgesic: Secondary | ICD-10-CM

## 2016-05-16 LAB — I-STAT CG4 LACTIC ACID, ED
LACTIC ACID, VENOUS: 2.22 mmol/L — AB (ref 0.5–1.9)
Lactic Acid, Venous: 2.17 mmol/L (ref 0.5–1.9)

## 2016-05-16 LAB — URINALYSIS, ROUTINE W REFLEX MICROSCOPIC
BILIRUBIN URINE: NEGATIVE
Bacteria, UA: NONE SEEN
Glucose, UA: NEGATIVE mg/dL
Ketones, ur: NEGATIVE mg/dL
NITRITE: NEGATIVE
PH: 7 (ref 5.0–8.0)
Protein, ur: 30 mg/dL — AB
SPECIFIC GRAVITY, URINE: 1.041 — AB (ref 1.005–1.030)

## 2016-05-16 LAB — CBC WITH DIFFERENTIAL/PLATELET
BASOS PCT: 0 %
Basophils Absolute: 0 10*3/uL (ref 0.0–0.1)
EOS ABS: 0 10*3/uL (ref 0.0–0.7)
EOS PCT: 0 %
HCT: 48.2 % — ABNORMAL HIGH (ref 36.0–46.0)
Hemoglobin: 16.8 g/dL — ABNORMAL HIGH (ref 12.0–15.0)
Lymphocytes Relative: 18 %
Lymphs Abs: 2.4 10*3/uL (ref 0.7–4.0)
MCH: 29.5 pg (ref 26.0–34.0)
MCHC: 34.9 g/dL (ref 30.0–36.0)
MCV: 84.7 fL (ref 78.0–100.0)
MONO ABS: 0.7 10*3/uL (ref 0.1–1.0)
Monocytes Relative: 5 %
NEUTROS ABS: 10.3 10*3/uL — AB (ref 1.7–7.7)
Neutrophils Relative %: 77 %
PLATELETS: 316 10*3/uL (ref 150–400)
RBC: 5.69 MIL/uL — ABNORMAL HIGH (ref 3.87–5.11)
RDW: 13.4 % (ref 11.5–15.5)
WBC: 13.4 10*3/uL — ABNORMAL HIGH (ref 4.0–10.5)

## 2016-05-16 LAB — COMPREHENSIVE METABOLIC PANEL
ALBUMIN: 4.7 g/dL (ref 3.5–5.0)
ALT: 32 U/L (ref 14–54)
ANION GAP: 14 (ref 5–15)
AST: 28 U/L (ref 15–41)
Alkaline Phosphatase: 95 U/L (ref 38–126)
BUN: 21 mg/dL — ABNORMAL HIGH (ref 6–20)
CHLORIDE: 101 mmol/L (ref 101–111)
CO2: 24 mmol/L (ref 22–32)
Calcium: 9.8 mg/dL (ref 8.9–10.3)
Creatinine, Ser: 1.26 mg/dL — ABNORMAL HIGH (ref 0.44–1.00)
GFR calc Af Amer: 49 mL/min — ABNORMAL LOW (ref >60)
GFR calc non Af Amer: 42 mL/min — ABNORMAL LOW (ref >60)
GLUCOSE: 165 mg/dL — AB (ref 65–99)
POTASSIUM: 3.7 mmol/L (ref 3.5–5.1)
Sodium: 139 mmol/L (ref 135–145)
Total Bilirubin: 1 mg/dL (ref 0.3–1.2)
Total Protein: 8 g/dL (ref 6.5–8.1)

## 2016-05-16 LAB — I-STAT TROPONIN, ED: Troponin i, poc: 0 ng/mL (ref 0.00–0.08)

## 2016-05-16 LAB — LIPASE, BLOOD: LIPASE: 23 U/L (ref 11–51)

## 2016-05-16 MED ORDER — SODIUM CHLORIDE 0.9 % IV BOLUS (SEPSIS)
1000.0000 mL | Freq: Once | INTRAVENOUS | Status: AC
  Administered 2016-05-16: 1000 mL via INTRAVENOUS

## 2016-05-16 MED ORDER — DEXTROSE-NACL 5-0.45 % IV SOLN: INTRAVENOUS | Status: DC

## 2016-05-16 MED ORDER — ISOSORBIDE MONONITRATE ER 60 MG PO TB24
60.0000 mg | ORAL_TABLET | Freq: Every day | ORAL | Status: DC
  Administered 2016-05-16 – 2016-05-19 (×3): 60 mg via ORAL
  Filled 2016-05-16 (×3): qty 1

## 2016-05-16 MED ORDER — ONDANSETRON HCL 4 MG/2ML IJ SOLN
4.0000 mg | Freq: Once | INTRAMUSCULAR | Status: AC
  Administered 2016-05-16: 4 mg via INTRAVENOUS
  Filled 2016-05-16: qty 2

## 2016-05-16 MED ORDER — DIPHENHYDRAMINE HCL 50 MG/ML IJ SOLN
25.0000 mg | Freq: Once | INTRAMUSCULAR | Status: DC
  Filled 2016-05-16: qty 1

## 2016-05-16 MED ORDER — VITAMIN B-12 100 MCG PO TABS
100.0000 ug | ORAL_TABLET | Freq: Every day | ORAL | Status: DC
  Administered 2016-05-18 – 2016-05-19 (×2): 100 ug via ORAL
  Filled 2016-05-16 (×2): qty 1

## 2016-05-16 MED ORDER — HEPARIN SODIUM (PORCINE) 5000 UNIT/ML IJ SOLN
5000.0000 [IU] | Freq: Three times a day (TID) | INTRAMUSCULAR | Status: DC
  Administered 2016-05-16 – 2016-05-19 (×5): 5000 [IU] via SUBCUTANEOUS
  Filled 2016-05-16 (×7): qty 1

## 2016-05-16 MED ORDER — FLUTICASONE PROPIONATE 50 MCG/ACT NA SUSP
2.0000 | Freq: Every day | NASAL | Status: DC | PRN
  Filled 2016-05-16: qty 16

## 2016-05-16 MED ORDER — HYDROMORPHONE HCL 1 MG/ML IJ SOLN
1.0000 mg | Freq: Once | INTRAMUSCULAR | Status: AC
  Administered 2016-05-16: 1 mg via INTRAVENOUS
  Filled 2016-05-16: qty 1

## 2016-05-16 MED ORDER — BUPROPION HCL ER (XL) 150 MG PO TB24: 300.0000 mg | ORAL_TABLET | Freq: Every morning | ORAL | Status: DC

## 2016-05-16 MED ORDER — MILNACIPRAN HCL 50 MG PO TABS
100.0000 mg | ORAL_TABLET | Freq: Every day | ORAL | Status: DC
  Administered 2016-05-16: 100 mg via ORAL
  Filled 2016-05-16 (×2): qty 2

## 2016-05-16 MED ORDER — DIPHENHYDRAMINE HCL 50 MG/ML IJ SOLN
25.0000 mg | Freq: Once | INTRAMUSCULAR | Status: AC
  Administered 2016-05-16: 25 mg via INTRAVENOUS

## 2016-05-16 MED ORDER — ASPIRIN EC 81 MG PO TBEC
81.0000 mg | DELAYED_RELEASE_TABLET | Freq: Every day | ORAL | Status: DC
  Administered 2016-05-18 – 2016-05-19 (×2): 81 mg via ORAL
  Filled 2016-05-16 (×2): qty 1

## 2016-05-16 MED ORDER — SODIUM CHLORIDE 0.9% FLUSH
3.0000 mL | Freq: Two times a day (BID) | INTRAVENOUS | Status: DC
  Administered 2016-05-17: 3 mL via INTRAVENOUS

## 2016-05-16 MED ORDER — DEXTROSE-NACL 5-0.45 % IV SOLN
INTRAVENOUS | Status: DC
  Administered 2016-05-16: 13:00:00 via INTRAVENOUS

## 2016-05-16 MED ORDER — DEXTROSE-NACL 5-0.45 % IV SOLN
INTRAVENOUS | Status: DC
  Administered 2016-05-16 – 2016-05-17 (×3): via INTRAVENOUS

## 2016-05-16 MED ORDER — ROPINIROLE HCL 1 MG PO TABS
1.0000 mg | ORAL_TABLET | Freq: Every morning | ORAL | Status: DC
  Administered 2016-05-18 – 2016-05-19 (×2): 1 mg via ORAL
  Filled 2016-05-16 (×2): qty 1

## 2016-05-16 MED ORDER — HYDROMORPHONE HCL 2 MG/ML IJ SOLN
1.0000 mg | Freq: Once | INTRAMUSCULAR | Status: AC
  Administered 2016-05-16: 1 mg via INTRAVENOUS
  Filled 2016-05-16: qty 1

## 2016-05-16 MED ORDER — ROPINIROLE HCL 1 MG PO TABS: 1.0000 mg | ORAL_TABLET | ORAL | Status: DC

## 2016-05-16 MED ORDER — NITROGLYCERIN 0.4 MG SL SUBL: 0.4000 mg | SUBLINGUAL_TABLET | SUBLINGUAL | Status: DC | PRN

## 2016-05-16 MED ORDER — ZOLPIDEM TARTRATE 5 MG PO TABS: 5.0000 mg | ORAL_TABLET | Freq: Every evening | ORAL | Status: DC | PRN

## 2016-05-16 MED ORDER — ONDANSETRON HCL 4 MG/2ML IJ SOLN
4.0000 mg | Freq: Four times a day (QID) | INTRAMUSCULAR | Status: DC | PRN
  Administered 2016-05-16 – 2016-05-18 (×4): 4 mg via INTRAVENOUS
  Filled 2016-05-16 (×4): qty 2

## 2016-05-16 MED ORDER — BUPROPION HCL 75 MG PO TABS
150.0000 mg | ORAL_TABLET | Freq: Every evening | ORAL | Status: DC
  Administered 2016-05-17 – 2016-05-18 (×2): 150 mg via ORAL
  Filled 2016-05-16 (×3): qty 2

## 2016-05-16 MED ORDER — TIZANIDINE HCL 4 MG PO TABS
4.0000 mg | ORAL_TABLET | Freq: Three times a day (TID) | ORAL | Status: DC | PRN
Start: 2016-05-16 — End: 2016-05-19
  Administered 2016-05-19: 4 mg via ORAL
  Filled 2016-05-16: qty 1
  Filled 2016-05-16: qty 2
  Filled 2016-05-16: qty 1

## 2016-05-16 MED ORDER — DILTIAZEM HCL ER 240 MG PO CP24
240.0000 mg | ORAL_CAPSULE | Freq: Every day | ORAL | Status: DC
  Filled 2016-05-16: qty 1
  Filled 2016-05-16: qty 2

## 2016-05-16 MED ORDER — DIPHENHYDRAMINE HCL 50 MG/ML IJ SOLN
25.0000 mg | Freq: Once | INTRAMUSCULAR | Status: AC
  Administered 2016-05-16: 25 mg via INTRAVENOUS
  Filled 2016-05-16: qty 1

## 2016-05-16 MED ORDER — HYDRALAZINE HCL 20 MG/ML IJ SOLN
10.0000 mg | Freq: Once | INTRAMUSCULAR | Status: AC
  Administered 2016-05-16: 10 mg via INTRAVENOUS
  Filled 2016-05-16: qty 1

## 2016-05-16 MED ORDER — LINACLOTIDE 72 MCG PO CAPS
72.0000 ug | ORAL_CAPSULE | Freq: Every day | ORAL | Status: DC | PRN
  Filled 2016-05-16: qty 1

## 2016-05-16 MED ORDER — PANTOPRAZOLE SODIUM 40 MG PO TBEC
40.0000 mg | DELAYED_RELEASE_TABLET | Freq: Every day | ORAL | Status: DC
  Administered 2016-05-18 – 2016-05-19 (×2): 40 mg via ORAL
  Filled 2016-05-16 (×2): qty 1

## 2016-05-16 MED ORDER — HYDROMORPHONE HCL 2 MG/ML IJ SOLN
0.5000 mg | Freq: Once | INTRAMUSCULAR | Status: AC
  Administered 2016-05-16: 0.5 mg via INTRAVENOUS
  Filled 2016-05-16: qty 1

## 2016-05-16 MED ORDER — LUBIPROSTONE 24 MCG PO CAPS
24.0000 ug | ORAL_CAPSULE | Freq: Two times a day (BID) | ORAL | Status: DC
  Filled 2016-05-16 (×3): qty 1

## 2016-05-16 MED ORDER — ROPINIROLE HCL 1 MG PO TABS
2.0000 mg | ORAL_TABLET | Freq: Every day | ORAL | Status: DC
  Administered 2016-05-16 – 2016-05-18 (×3): 2 mg via ORAL
  Filled 2016-05-16 (×3): qty 2

## 2016-05-16 MED ORDER — DILTIAZEM HCL ER COATED BEADS 120 MG PO CP24
240.0000 mg | ORAL_CAPSULE | Freq: Every day | ORAL | Status: DC
  Administered 2016-05-16 – 2016-05-19 (×3): 240 mg via ORAL
  Filled 2016-05-16 (×2): qty 2

## 2016-05-16 MED ORDER — IOPAMIDOL (ISOVUE-300) INJECTION 61%
INTRAVENOUS | Status: AC
  Administered 2016-05-16: 75 mL
  Filled 2016-05-16: qty 75

## 2016-05-16 MED ORDER — METOCLOPRAMIDE HCL 5 MG/ML IJ SOLN
10.0000 mg | Freq: Once | INTRAMUSCULAR | Status: AC
  Administered 2016-05-16: 10 mg via INTRAVENOUS
  Filled 2016-05-16: qty 2

## 2016-05-16 MED ORDER — METOPROLOL TARTRATE 25 MG PO TABS
25.0000 mg | ORAL_TABLET | Freq: Two times a day (BID) | ORAL | Status: DC
  Administered 2016-05-16 – 2016-05-19 (×5): 25 mg via ORAL
  Filled 2016-05-16 (×6): qty 1

## 2016-05-16 MED ORDER — ROPINIROLE HCL 1 MG PO TABS
1.0000 mg | ORAL_TABLET | Freq: Once | ORAL | Status: AC
  Administered 2016-05-16: 1 mg via ORAL
  Filled 2016-05-16 (×2): qty 1

## 2016-05-16 MED ORDER — HYDRALAZINE HCL 20 MG/ML IJ SOLN
10.0000 mg | Freq: Four times a day (QID) | INTRAMUSCULAR | Status: DC | PRN
Start: 2016-05-16 — End: 2016-05-19

## 2016-05-16 MED ORDER — ONDANSETRON HCL 4 MG PO TABS: 4.0000 mg | ORAL_TABLET | Freq: Four times a day (QID) | ORAL | Status: DC | PRN

## 2016-05-16 MED ORDER — LORAZEPAM 1 MG PO TABS
2.0000 mg | ORAL_TABLET | Freq: Every day | ORAL | Status: DC
  Administered 2016-05-16 – 2016-05-18 (×3): 2 mg via ORAL
  Filled 2016-05-16 (×3): qty 2

## 2016-05-16 NOTE — ED Notes (Signed)
Pt. sts that she still feels nauseous with sips of water

## 2016-05-16 NOTE — ED Notes (Signed)
Patient unable to void at this time, Pt. Aware we need a ua.

## 2016-05-16 NOTE — ED Notes (Signed)
Pt. Still CO of nausea and restless legs

## 2016-05-16 NOTE — Progress Notes (Signed)
05/16/2016 2:41 PM  New Admission Note:   Arrival Method: Via stretcher with EMT Gerald Stabs  Mental Orientation: Alert and Oriented X4 Telemetry: Placed on box 6E05. CCMD Notified  Assessment: Completed Skin: Warm, dry and intact. No open wounds on assessment  IV: Clean, dry and intact. IVF running  Pain: Given medication in ED (check eMAR). Pain reassessment is a 3 out 10.  Tubes: [name] Safety Measures: Safety Fall Prevention Plan has been given, discussed.  Admission: Completed [address] Orientation: Patient has been orientated to the room, unit and staff.  Family: Husband at the bedside.   Orders have been reviewed and implemented. Will continue to monitor the patient. Call light has been placed within reach and bed alarm has been activated.   Luisfernando Brightwell BSN, RN-BC [employer]  Phone number: [phone]

## 2016-05-16 NOTE — ED Notes (Signed)
Report called  

## 2016-05-16 NOTE — Progress Notes (Signed)
Pt back from ABD [place].  Patient alert and oriented.  Family at bedside.

## 2016-05-16 NOTE — H&P (Signed)
History and Physical    Angela [name] [id number] DOB: [birth year] DOA: 05/16/2016  PCP: [name], MD   Patient coming from: Home  Chief Complaint: intractable nausea and vomiting   HPI: Angela [name] is a 70 y.o. female 's previous history of hypertension, nonobstructive coronary artery disease by cath in July 2017, hyperlipidemia, chronic cervicalgia, status post multiple cervical spine surgeries, restless leg syndrome who presented to the emergency department with complaints of recurrent severe upper abdominal pain, intractable nausea and vomiting. Patient reports onset of symptoms approximately 3 days ago and initially they started as mild, but were gradually escalating to burning severe epigastric pain and frequent nonbloody, nonbilious emesis with inability to tolerate sips of water. Patient reported that during the last 48 hours she was unable to follow during anything. She denied diarrhea or constipation. aLthough in general she is more prone to being constipated. Chronic low grade temperature and upper 99-100 degrees, but never had it greater than 100.98F Patient reports that this is a recurrent problem and she was seen in an high [facility], on the round upper GI endoscopy that didn't reveal any significant abnormality. She's being followed by a gastroenterologist from the [facility] physicians   ED Course: In the emergency department she was tachycardic on presentation with heart rate in 120's, telemetry and EKG showed normal sinus rhythm without acute changes Blood work revealed leukocytosis with white blood cells count of 13,400, elevated hemoglobin of 16.8 g/dL, abnormal creatinine of 1.26 was baseline creatinine of 0.9, elevated glucose level 165 and Lactic acidosis with lactate level of 2.22 Lipase was normal-23 U/L Abdominal CT with contrast demonstrated hepatic steatosis and aortic atherosclerosis, no acute abnormalities otherwise   Review of Systems: As per HPI  otherwise 10 point review of systems negative.    Past Medical History:  Diagnosis Date  . Anxiety   . Cancer of sigmoid ([place])   . Depression (emotion)   . DJD (degenerative joint disease)   . Fibromyalgia   . GERD (gastroesophageal reflux disease)   . Hemorrhoids   . Hyperlipidemia   . Hypertension   . IBS (irritable bowel syndrome)   . MRSA (methicillin resistant Staphylococcus aureus) septicemia ([place])    history of  . Over weight   . Pancreatitis   . RLS (restless legs syndrome)   . Tachycardia     Past Surgical History:  Procedure Laterality Date  . CARDIAC CATHETERIZATION [name] 12/26/2015   Procedure: Left Heart Cath and Coronary Angiography;  Surgeon: Troy Sine, MD;  Location: [facility];  Service: Cardiovascular;  Laterality: [name];  . child birth     Social history:  reports that she has never smoked. She has never used smokeless tobacco. She reports that she does not drink alcohol or use drugs.  Allergies  Allergen Reactions  . Norvasc [Amlodipine Besylate] Swelling  . Phenergan [Promethazine] Anaphylaxis  . Timentin [Ticarcillin-Pot Clavulanate] Anaphylaxis    Has patient had a PCN reaction causing immediate rash, facial/tongue/throat swelling, SOB or lightheadedness with hypotension: Yes Has patient had a PCN reaction causing severe rash involving mucus membranes or skin necrosis: Yes Has patient had a PCN reaction that required hospitalization Yes Has patient had a PCN reaction occurring within the last 10 years: No If all of the above answers are "NO", then may proceed with Cephalosporin use.     Family History  Problem Relation Age of Onset  . Hypertension Mother   . Heart disease Mother   . Heart  attack Father   . Stroke Father   . Diabetes Sister   . Hypertension Sister   . Stroke    . Diabetes    . Heart disease      Prior to Admission medications   Medication Sig Start Date End Date Taking? Authorizing Provider  AMITIZA 24 MCG  capsule Take 1 capsule by mouth 2 (two) times daily. 10/17/15  Yes Historical Provider, MD  aspirin EC 81 MG tablet Take 81 mg by mouth daily.   Yes Historical Provider, MD  buPROPion (WELLBUTRIN XL) 150 MG 24 hr tablet Take two (2) tablets (300 mg total) by mouth daily in the morning and take one (1) tablet (150 mg total) by mouth daily at bedtime.   Yes Historical Provider, MD  diltiazem (DILACOR XR) 240 MG 24 hr capsule Take 1 capsule (240 mg total) by mouth daily. 01/08/16  Yes Imogene Burn, PA-C  esomeprazole (NEXIUM) 40 MG capsule Take 40 mg by mouth 2 (two) times daily before a meal.   Yes Historical Provider, MD  fluticasone (FLONASE) 50 MCG/ACT nasal spray Place 2 sprays into both nostrils daily as needed for allergies or rhinitis.    Yes Historical Provider, MD  isosorbide mononitrate (IMDUR) 60 MG 24 hr tablet Take 1 tablet (60 mg total) by mouth daily. 03/11/16 06/09/16 Yes Dorothy Spark, MD  LINZESS 72 MCG capsule Take 72 mcg by mouth daily as needed. 03/27/16  Yes Historical Provider, MD  LORazepam (ATIVAN) 2 MG tablet Take 2 mg by mouth at bedtime.    Yes Historical Provider, MD  losartan (COZAAR) 25 MG tablet Take 1 tablet (25 mg total) by mouth daily. 01/08/16  Yes Imogene Burn, PA-C  metoprolol tartrate (LOPRESSOR) 25 MG tablet Take 1 tablet (25 mg total) by mouth 2 (two) times daily. 01/08/16  Yes Imogene Burn, PA-C  Milnacipran HCl (SAVELLA) 100 MG TABS tablet Take 100 mg by mouth daily.    Yes Historical Provider, MD  omega-3 acid ethyl esters (LOVAZA) 1 g capsule Take 1 g by mouth 2 (two) times daily.   Yes Historical Provider, MD  ondansetron (ZOFRAN-ODT) 4 MG disintegrating tablet Take 4 mg by mouth every 6 (six) hours as needed for nausea or vomiting.   Yes Historical Provider, MD  Oxycodone HCl 10 MG TABS Take 10 mg by mouth every 6 (six) hours.   Yes Historical Provider, MD  rOPINIRole (REQUIP) 1 MG tablet Take 1-2 mg by mouth See admin instructions. ONE TABLET  IN AM  TWO TABLETS IN PM    Yes Historical Provider, MD  rosuvastatin (CRESTOR) 20 MG tablet Take 1 tablet (20 mg total) by mouth daily. 04/17/16 07/16/16 Yes Dorothy Spark, MD  tiZANidine (ZANAFLEX) 4 MG capsule Take 4 mg by mouth every 8 (eight) hours as needed for muscle spasms.   Yes Historical Provider, MD  vitamin B-12 (CYANOCOBALAMIN) 100 MCG tablet Take 100 mcg by mouth daily.   Yes Historical Provider, MD  nitroGLYCERIN (NITROSTAT) 0.4 MG SL tablet Place 1 tablet (0.4 mg total) under the tongue every 5 (five) minutes as needed for chest pain. 01/08/16 04/07/16  Imogene Burn, PA-C    Physical Exam: Vitals:   05/16/16 1245 05/16/16 1330 05/16/16 1345 05/16/16 1422  BP: 148/91 184/91 (!) 180/102 (!) 183/87  Pulse: 101 106 100 95  Resp: 20 20 22 20   Temp:    98.8 F (37.1 C)  TempSrc:    Oral  SpO2: 98% 98% 100%  100%  Weight:    76.7 kg (169 lb 1.6 oz)  Height:    5\' 5"  (1.651 m)      Constitutional: NAD, calm, comfortable Vitals:   05/16/16 1245 05/16/16 1330 05/16/16 1345 05/16/16 1422  BP: 148/91 184/91 (!) 180/102 (!) 183/87  Pulse: 101 106 100 95  Resp: 20 20 22 20   Temp:    98.8 F (37.1 C)  TempSrc:    Oral  SpO2: 98% 98% 100% 100%  Weight:    76.7 kg (169 lb 1.6 oz)  Height:    5\' 5"  (1.651 m)   Eyes: PERRL, lids and conjunctivae normal, extraocular movements intact, no scleral icterus observed  ENMT: Mucous membranes are moist. Posterior pharynx clear of any exudate or lesions.Normal dentition.  Neck: normal,mild stiffness with pain on palpation, limited range of motion  no masses, no thyromegaly Respiratory: clear to auscultation bilaterally, no wheezing, no crackles. Normal respiratory effort. No accessory muscle use.  Cardiovascular: Regular rate and rhythm, no murmurs / rubs / gallops. No extremity edema. 2+ pedal pulses. No carotid bruits.  Abdomen: no tenderness, no masses palpated. No hepatosplenomegaly. Bowel sounds positive.  Musculoskeletal: no clubbing /  cyanosis. No joint deformity upper and lower extremities. Good ROM, no contractures. Normal muscle tone.  Skin: no rashes, lesions, ulcers. No induration Neurologic: CN 2-12 grossly intact. Sensation intact, DTR normal. Strength 5/5 in all 4.  Psychiatric: Normal judgment and insight. Alert and oriented x 3. Normal mood.    Labs on Admission: I have personally reviewed following labs and imaging studies  CBC:  Recent Labs Lab 05/16/16 0925  WBC 13.4*  NEUTROABS 10.3*  HGB 16.8*  HCT 48.2*  MCV 84.7  PLT 123XX123   Basic Metabolic Panel:  Recent Labs Lab 05/16/16 0925  NA 139  K 3.7  CL 101  CO2 24  GLUCOSE 165*  BUN 21*  CREATININE 1.26*  CALCIUM 9.8   GFR: Estimated Creatinine Clearance: 43.2 mL/min (by C-G formula based on SCr of 1.26 mg/dL (H)). Liver Function Tests:  Recent Labs Lab 05/16/16 0925  AST 28  ALT 32  ALKPHOS 95  BILITOT 1.0  PROT 8.0  ALBUMIN 4.7    Recent Labs Lab 05/16/16 0925  LIPASE 23   Urine analysis:    Component Value Date/Time   COLORURINE YELLOW 05/16/2016 [place] 05/16/2016 1207   LABSPEC 1.041 (H) 05/16/2016 1207   PHURINE 7.0 05/16/2016 [place] 05/16/2016 1207   HGBUR SMALL (A) 05/16/2016 1207   BILIRUBINUR NEGATIVE 05/16/2016 [place] 05/16/2016 1207   PROTEINUR 30 (A) 05/16/2016 1207   UROBILINOGEN 1.0 12/24/2010 1437   NITRITE NEGATIVE 05/16/2016 1207   LEUKOCYTESUR SMALL (A) 05/16/2016 1207     Radiological Exams on Admission: Ct Abdomen Pelvis W Contrast  Result Date: 05/16/2016 CLINICAL DATA:  Abdominal pain with vomiting for 3 days. Dehydration. EXAM: CT ABDOMEN AND PELVIS WITH CONTRAST TECHNIQUE: Multidetector CT imaging of the abdomen and pelvis was performed using the standard protocol following bolus administration of intravenous contrast. CONTRAST:  42mL ISOVUE-300 IOPAMIDOL (ISOVUE-300) INJECTION 61% COMPARISON:  12/11/2014 FINDINGS: Lower chest:  Subsegmental atelectasis and scarring are present in the right lower lobe. Hepatobiliary: Diffusely decreased attenuation of the liver consistent with steatosis. Prior cholecystectomy. Unchanged mild common bile duct dilatation. Pancreas: Slight dilatation of the pancreatic duct in the pancreatic head measures 4 mm, slightly decreased from prior. No evidence of pancreatic mass, atrophy, or inflammation. Spleen: Unremarkable.  Adrenals/Urinary Tract: Unremarkable adrenal glands. Small low-density lesions in both kidneys are similar to the prior study and too small to fully characterize but is likely represent cysts. No renal calculi or hydronephrosis is seen. The bladder is unremarkable. Stomach/Bowel: Tiny sliding hiatal hernia as previously seen. Postsurgical changes again noted with an anastomosis in the distal descending colon. No evidence of bowel obstruction or wall thickening. The appendix is unremarkable. Vascular/Lymphatic: Abdominal aortic atherosclerosis without aneurysm. No enlarged abdominal or pelvic lymph nodes. Reproductive: Uterus is absent. Left ovary is not identified. Right ovary is unremarkable. Other: No intraperitoneal free fluid. Small fat-containing periumbilical hernia is unchanged. Musculoskeletal: Thoracolumbar disc degeneration. Unchanged L2 inferior endplate Schmorl's node. IMPRESSION: 1. No acute abnormality identified in the abdomen or pelvis. 2. Hepatic steatosis. 3. Aortic atherosclerosis. Electronically Signed   By: Logan Bores M.D.   On: 05/16/2016 11:09   US Abdomen Limited Ruq  Result Date: 05/16/2016 CLINICAL DATA:  Right upper quadrant pain for the past 2 days. Previous cholecystectomy. History of nonalcoholic steatohepatitis EXAM: US ABDOMEN LIMITED - RIGHT UPPER QUADRANT COMPARISON:  CT scan of the abdomen and pelvis of today's date FINDINGS: Gallbladder: The gallbladder is surgically absent. Common bile duct: Diameter: 7 mm Liver: The hepatic echotexture is increased  diffusely. There is no focal mass or ductal dilation. The surface contour of the liver is smooth. IMPRESSION: Increased hepatic echotexture compatible with fatty infiltrative change. No discrete hepatic mass. Normal appearing common bile duct. The gallbladder is surgically absent. Electronically Signed   By: David  Martinique M.D.   On: 05/16/2016 16:25    EKG: Independently reviewed.  Assessment/Plan Principal Problem:   Dehydration Active Problems:   Family history of early CAD   Hyperlipidemia   CAD (coronary artery disease)  Dehydration associated with intractable nausea and vomiting Patient received 2 L of D5-1/2 L NS IV and is now on maintenance IV hydration of  125 cc/h Tachycardia resolved Continue Zofran for nausea control Continue nothing by mouth except sips of water for medications until patient is able to tolerate oral intake  Abdominal pain and patient was previously history of cholecystectomy and abnormal CT scan   right upper quadrant ultrasound was ordered and revealed increased hepatic echotexture compatible with fatty infiltrative changes, no discrete hepatic mas, normal appearing common bile duct   Will continue IV Dilaudid for pain control If patient continues having intractable nausea vomiting, abdominal pain, might need GI consult while inpatient, otherwise follow up with [place] on ambulatory visit to GI specialist postdischarge  AKI  - creatinine ion admission 1.26 and the last normal creatinine of 0.96 in September of this year Probably associated with dehydration.  Continue IV fluids and recheck renal cyst tomorrow morning Hold Cozaar and restart when creatinine returns to normal  Hypertension  Patient developed acceleration of blood pressure to 186/110 millimeters of mercury during my assessment. In the ED. When necessary hydralazine was given with good result Continue to monitor blood pressure and adjust doses if needed Chronically is on hold secondary to AKI  CAD  - patient is free of pain, stable Continue home medications and monitor her symptoms    DVT prophylaxis: Heparin  Code Status: Full Family Communication: Spoke with patient's husband and daughter are present at bedside  Disposition Plan: Home in 48 hours Consults called: None Admission status: Observation   York Grice MD [facility] Pager [phone]  If 7PM-7AM, please contact night-coverage [email or web address] Password [facility]  05/16/2016, 5:25 PM    Patient seen  and examined. Case and orders reviewed with P.A. York Grice.  Agree with above assessment, plan and orders.   -[employer], D.O.

## 2016-05-16 NOTE — ED Triage Notes (Signed)
Pt. Reports having n/v since WEdnesday.   She is also having intermittent abdominal burning.  Husband reports that pt. Has a hx of pancreatitis.  She denies any  Diarrhea   She is vomiting yellow. Denies any blood.  She is taking Zofran and is not helping

## 2016-05-16 NOTE — ED Provider Notes (Signed)
[facility] DEPT Provider Note   CSN: [id number] Arrival date & time: 05/16/16  0840     History   Chief Complaint Chief Complaint  Patient presents with  . Abdominal Pain  . Emesis    HPI Angela [name] is a 70 y.o. female.  HPI 29 old female with past medical history as below who presents with severe abdominal pain, nausea, and vomiting. Patient states that her symptoms started approximately 3 days ago. Her symptoms started as a mild, burning, aching epigastric pain followed by acute onset of nausea and nonbloody, nonbilious emesis. Over the last 2 days, she has had progressively worsening abdominal pain and persistent vomiting. She states she has been unable to eat or drink for the last 48 hours. She has tried sips of water but this immediately caused her pain worsen followed by emesis. She denies any blood in her emesis or stools. She's had no diarrhea. She has had temperatures in the 99-100 range but no fevers over 100.4. Her symptoms seem to worsen with eating and are intermittently relieved with time as well as position changes. She has a history of similar symptoms in the past and has had an EGD that was unremarkable. Of note, she also failed a stress test 6 months ago but states that she did not require any stents on her catheterization. Denies any upper chest pain, shortness of breath, or diaphoresis.  Past Medical History:  Diagnosis Date  . Anxiety   . Cancer of sigmoid ([place])   . Depression (emotion)   . DJD (degenerative joint disease)   . Fibromyalgia   . GERD (gastroesophageal reflux disease)   . Hemorrhoids   . Hyperlipidemia   . Hypertension   . IBS (irritable bowel syndrome)   . MRSA (methicillin resistant Staphylococcus aureus) septicemia ([place])    history of  . Over weight   . Pancreatitis   . RLS (restless legs syndrome)   . Tachycardia     Patient Active Problem List   Diagnosis Date Noted  . Dehydration 05/16/2016  . Muscle ache of extremity  02/20/2016  . CAD (coronary artery disease) 01/08/2016  . Abnormal stress test 12/12/2015  . Chest pain 11/27/2015  . Family history of early CAD 11/27/2015  . Hyperlipidemia 11/27/2015    Past Surgical History:  Procedure Laterality Date  . CARDIAC CATHETERIZATION [name] 12/26/2015   Procedure: Left Heart Cath and Coronary Angiography;  Surgeon: Troy Sine, MD;  Location: [facility];  Service: Cardiovascular;  Laterality: [name];  . child birth      OB History    No data available       Home Medications    Prior to Admission medications   Medication Sig Start Date End Date Taking? Authorizing Provider  AMITIZA 24 MCG capsule Take 1 capsule by mouth 2 (two) times daily. 10/17/15  Yes Historical Provider, MD  aspirin EC 81 MG tablet Take 81 mg by mouth daily.   Yes Historical Provider, MD  buPROPion (WELLBUTRIN XL) 150 MG 24 hr tablet Take two (2) tablets (300 mg total) by mouth daily in the morning and take one (1) tablet (150 mg total) by mouth daily at bedtime.   Yes Historical Provider, MD  diltiazem (DILACOR XR) 240 MG 24 hr capsule Take 1 capsule (240 mg total) by mouth daily. 01/08/16  Yes Imogene Burn, PA-C  esomeprazole (NEXIUM) 40 MG capsule Take 40 mg by mouth 2 (two) times daily before a meal.   Yes Historical  Provider, MD  fluticasone (FLONASE) 50 MCG/ACT nasal spray Place 2 sprays into both nostrils daily as needed for allergies or rhinitis.    Yes Historical Provider, MD  isosorbide mononitrate (IMDUR) 60 MG 24 hr tablet Take 1 tablet (60 mg total) by mouth daily. 03/11/16 06/09/16 Yes Dorothy Spark, MD  LINZESS 72 MCG capsule Take 72 mcg by mouth daily as needed. 03/27/16  Yes Historical Provider, MD  LORazepam (ATIVAN) 2 MG tablet Take 2 mg by mouth at bedtime.    Yes Historical Provider, MD  losartan (COZAAR) 25 MG tablet Take 1 tablet (25 mg total) by mouth daily. 01/08/16  Yes Imogene Burn, PA-C  metoprolol tartrate (LOPRESSOR) 25 MG tablet Take 1 tablet  (25 mg total) by mouth 2 (two) times daily. 01/08/16  Yes Imogene Burn, PA-C  Milnacipran HCl (SAVELLA) 100 MG TABS tablet Take 100 mg by mouth daily.    Yes Historical Provider, MD  omega-3 acid ethyl esters (LOVAZA) 1 g capsule Take 1 g by mouth 2 (two) times daily.   Yes Historical Provider, MD  ondansetron (ZOFRAN-ODT) 4 MG disintegrating tablet Take 4 mg by mouth every 6 (six) hours as needed for nausea or vomiting.   Yes Historical Provider, MD  Oxycodone HCl 10 MG TABS Take 10 mg by mouth every 6 (six) hours.   Yes Historical Provider, MD  rOPINIRole (REQUIP) 1 MG tablet Take 1-2 mg by mouth See admin instructions. ONE TABLET  IN AM TWO TABLETS IN PM    Yes Historical Provider, MD  rosuvastatin (CRESTOR) 20 MG tablet Take 1 tablet (20 mg total) by mouth daily. 04/17/16 07/16/16 Yes Dorothy Spark, MD  tiZANidine (ZANAFLEX) 4 MG capsule Take 4 mg by mouth every 8 (eight) hours as needed for muscle spasms.   Yes Historical Provider, MD  vitamin B-12 (CYANOCOBALAMIN) 100 MCG tablet Take 100 mcg by mouth daily.   Yes Historical Provider, MD  nitroGLYCERIN (NITROSTAT) 0.4 MG SL tablet Place 1 tablet (0.4 mg total) under the tongue every 5 (five) minutes as needed for chest pain. 01/08/16 04/07/16  Imogene Burn, PA-C    Family History Family History  Problem Relation Age of Onset  . Hypertension Mother   . Heart disease Mother   . Heart attack Father   . Stroke Father   . Diabetes Sister   . Hypertension Sister   . Stroke    . Diabetes    . Heart disease      Social History Social History  Substance Use Topics  . Smoking status: Never Smoker  . Smokeless tobacco: Never Used  . Alcohol use No     Allergies   Norvasc [amlodipine besylate]; Phenergan [promethazine]; and Timentin [ticarcillin-pot clavulanate]   Review of Systems Review of Systems  Constitutional: Positive for chills and fatigue. Negative for fever.  HENT: Negative for congestion and rhinorrhea.   Eyes:  Negative for visual disturbance.  Respiratory: Negative for cough and wheezing.   Cardiovascular: Negative for chest pain and leg swelling.  Gastrointestinal: Positive for abdominal pain, nausea and vomiting. Negative for diarrhea.  Genitourinary: Negative for dysuria and flank pain.  Musculoskeletal: Negative for neck pain and neck stiffness.  Skin: Negative for rash and wound.  Allergic/Immunologic: Negative for immunocompromised state.  Neurological: Negative for syncope, weakness and headaches.  All other systems reviewed and are negative.    Physical Exam Updated Vital Signs BP (!) 183/87 (BP Location: Right Arm)   Pulse 95   Temp  98.8 F (37.1 C) (Oral)   Resp 20   Ht 5\' 5"  (1.651 m)   Wt 169 lb 1.6 oz (76.7 kg)   SpO2 100%   BMI 28.14 kg/m   Physical Exam  Constitutional: She is oriented to person, place, and time. She appears well-developed and well-nourished. No distress.  HENT:  Head: Normocephalic and atraumatic.  Markedly dry mucous membranes  Eyes: Conjunctivae are normal.  Neck: Neck supple.  Cardiovascular: Normal rate, regular rhythm and normal heart sounds.  Exam reveals no friction rub.   No murmur heard. Pulmonary/Chest: Effort normal and breath sounds normal. No respiratory distress. She has no wheezes. She has no rales.  Abdominal: Soft. Bowel sounds are normal. She exhibits no distension. There is tenderness (Diffuse, worse in epigastric area). There is no rebound and no guarding.  Musculoskeletal: She exhibits no edema.  Neurological: She is alert and oriented to person, place, and time. She exhibits normal muscle tone.  Skin: Skin is warm. Capillary refill takes 2 to 3 seconds.  Psychiatric: She has a normal mood and affect.  Nursing note and vitals reviewed.    ED Treatments / Results  Labs (all labs ordered are listed, but only abnormal results are displayed) Labs Reviewed  CBC WITH DIFFERENTIAL/PLATELET - Abnormal; Notable for the following:        Result Value   WBC 13.4 (*)    RBC 5.69 (*)    Hemoglobin 16.8 (*)    HCT 48.2 (*)    Neutro Abs 10.3 (*)    All other components within normal limits  COMPREHENSIVE METABOLIC PANEL - Abnormal; Notable for the following:    Glucose, Bld 165 (*)    BUN 21 (*)    Creatinine, Ser 1.26 (*)    GFR calc non Af Amer 42 (*)    GFR calc Af Amer 49 (*)    All other components within normal limits  URINALYSIS, ROUTINE W REFLEX MICROSCOPIC - Abnormal; Notable for the following:    Specific Gravity, Urine 1.041 (*)    Hgb urine dipstick SMALL (*)    Protein, ur 30 (*)    Leukocytes, UA SMALL (*)    Squamous Epithelial / LPF 0-5 (*)    All other components within normal limits  I-STAT CG4 LACTIC ACID, ED - Abnormal; Notable for the following:    Lactic Acid, Venous 2.17 (*)    All other components within normal limits  I-STAT CG4 LACTIC ACID, ED - Abnormal; Notable for the following:    Lactic Acid, Venous 2.22 (*)    All other components within normal limits  LIPASE, BLOOD  HEMOGLOBIN A1C  COMPREHENSIVE METABOLIC PANEL  CBC  LACTIC ACID, PLASMA  I-STAT TROPOININ, ED    EKG  EKG Interpretation  Date/Time:  Friday May 16 2016 09:30:37 EST Ventricular Rate:  98 PR Interval:    QRS Duration: 86 QT Interval:  369 QTC Calculation: 472 R Axis:   -40 Text Interpretation:  Sinus rhythm Left axis deviation Abnormal R-wave progression, early transition Baseline wander in lead(s) V5 V6 No old tracing to compare Non-specific changes without evidence of acute ST changes No ST elevations  Confirmed by Ellender Hose MD, Lysbeth Galas [phone]) on 05/16/2016 9:36:47 AM       Radiology Ct Abdomen Pelvis W Contrast  Result Date: 05/16/2016 CLINICAL DATA:  Abdominal pain with vomiting for 3 days. Dehydration. EXAM: CT ABDOMEN AND PELVIS WITH CONTRAST TECHNIQUE: Multidetector CT imaging of the abdomen and pelvis was performed using the  standard protocol following bolus administration of intravenous  contrast. CONTRAST:  32mL ISOVUE-300 IOPAMIDOL (ISOVUE-300) INJECTION 61% COMPARISON:  12/11/2014 FINDINGS: Lower chest: Subsegmental atelectasis and scarring are present in the right lower lobe. Hepatobiliary: Diffusely decreased attenuation of the liver consistent with steatosis. Prior cholecystectomy. Unchanged mild common bile duct dilatation. Pancreas: Slight dilatation of the pancreatic duct in the pancreatic head measures 4 mm, slightly decreased from prior. No evidence of pancreatic mass, atrophy, or inflammation. Spleen: Unremarkable. Adrenals/Urinary Tract: Unremarkable adrenal glands. Small low-density lesions in both kidneys are similar to the prior study and too small to fully characterize but is likely represent cysts. No renal calculi or hydronephrosis is seen. The bladder is unremarkable. Stomach/Bowel: Tiny sliding hiatal hernia as previously seen. Postsurgical changes again noted with an anastomosis in the distal descending colon. No evidence of bowel obstruction or wall thickening. The appendix is unremarkable. Vascular/Lymphatic: Abdominal aortic atherosclerosis without aneurysm. No enlarged abdominal or pelvic lymph nodes. Reproductive: Uterus is absent. Left ovary is not identified. Right ovary is unremarkable. Other: No intraperitoneal free fluid. Small fat-containing periumbilical hernia is unchanged. Musculoskeletal: Thoracolumbar disc degeneration. Unchanged L2 inferior endplate Schmorl's node. IMPRESSION: 1. No acute abnormality identified in the abdomen or pelvis. 2. Hepatic steatosis. 3. Aortic atherosclerosis. Electronically Signed   By: Logan Bores M.D.   On: 05/16/2016 11:09   US Abdomen Limited Ruq  Result Date: 05/16/2016 CLINICAL DATA:  Right upper quadrant pain for the past 2 days. Previous cholecystectomy. History of nonalcoholic steatohepatitis EXAM: US ABDOMEN LIMITED - RIGHT UPPER QUADRANT COMPARISON:  CT scan of the abdomen and pelvis of today's date FINDINGS:  Gallbladder: The gallbladder is surgically absent. Common bile duct: Diameter: 7 mm Liver: The hepatic echotexture is increased diffusely. There is no focal mass or ductal dilation. The surface contour of the liver is smooth. IMPRESSION: Increased hepatic echotexture compatible with fatty infiltrative change. No discrete hepatic mass. Normal appearing common bile duct. The gallbladder is surgically absent. Electronically Signed   By: David  Martinique M.D.   On: 05/16/2016 16:25    Procedures Procedures (including critical care time)  Medications Ordered in ED Medications  diphenhydrAMINE (BENADRYL) injection 25 mg (25 mg Intravenous Not Given 05/16/16 1355)  heparin injection 5,000 Units (5,000 Units Subcutaneous Not Given 05/16/16 1515)  sodium chloride flush (NS) 0.9 % injection 3 mL (3 mLs Intravenous Not Given 05/16/16 1506)  zolpidem (AMBIEN) tablet 5 mg (not administered)  ondansetron (ZOFRAN) tablet 4 mg (not administered)    Or  ondansetron (ZOFRAN) injection 4 mg (not administered)  dextrose 5 %-0.45 % sodium chloride infusion ( Intravenous New Bag/Given 05/16/16 1549)  linaclotide (LINZESS) capsule 72 mcg (not administered)  vitamin B-12 (CYANOCOBALAMIN) tablet 100 mcg (100 mcg Oral Not Given 05/16/16 1515)  isosorbide mononitrate (IMDUR) 24 hr tablet 60 mg (60 mg Oral Given 05/16/16 1548)  metoprolol tartrate (LOPRESSOR) tablet 25 mg (25 mg Oral Given 05/16/16 1540)  lubiprostone (AMITIZA) capsule 24 mcg (not administered)  aspirin EC tablet 81 mg (not administered)  buPROPion (WELLBUTRIN XL) 24 hr tablet 300 mg (not administered)  pantoprazole (PROTONIX) EC tablet 40 mg (not administered)  fluticasone (FLONASE) 50 MCG/ACT nasal spray 2 spray (not administered)  LORazepam (ATIVAN) tablet 2 mg (not administered)  Milnacipran (SAVELLA) tablet TABS 100 mg (100 mg Oral Given 05/16/16 1737)  tiZANidine (ZANAFLEX) tablet 4 mg (not administered)  nitroGLYCERIN (NITROSTAT) SL tablet 0.4  mg (not administered)  buPROPion (WELLBUTRIN) tablet 150 mg (150 mg Oral Not Given 05/16/16 1738)  hydrALAZINE (APRESOLINE) injection 10 mg (not administered)  rOPINIRole (REQUIP) tablet 1 mg (not administered)  rOPINIRole (REQUIP) tablet 2 mg (not administered)  diltiazem (CARDIZEM CD) 24 hr capsule 240 mg (240 mg Oral Given 05/16/16 1544)  sodium chloride 0.9 % bolus 1,000 mL (0 mLs Intravenous Stopped 05/16/16 1152)  sodium chloride 0.9 % bolus 1,000 mL (0 mLs Intravenous Stopped 05/16/16 1209)  HYDROmorphone (DILAUDID) injection 0.5 mg (0.5 mg Intravenous Given 05/16/16 0942)  ondansetron (ZOFRAN) injection 4 mg (4 mg Intravenous Given 05/16/16 0942)  iopamidol (ISOVUE-300) 61 % injection (75 mLs  Contrast Given 05/16/16 1038)  metoCLOPramide (REGLAN) injection 10 mg (10 mg Intravenous Given 05/16/16 1146)  diphenhydrAMINE (BENADRYL) injection 25 mg (25 mg Intravenous Given 05/16/16 1146)  diphenhydrAMINE (BENADRYL) injection 25 mg (25 mg Intravenous Given 05/16/16 1242)  rOPINIRole (REQUIP) tablet 1 mg (1 mg Oral Given 05/16/16 1506)  hydrALAZINE (APRESOLINE) injection 10 mg (10 mg Intravenous Given 05/16/16 1402)  HYDROmorphone (DILAUDID) injection 1 mg (1 mg Intravenous Given 05/16/16 1403)     Initial Impression / Assessment and Plan / ED Course  I have reviewed the triage vital signs and the nursing notes.  Pertinent labs & imaging results that were available during my care of the patient were reviewed by me and considered in my medical decision making (see chart for details).  Clinical Course as of May 16 1810  Fri May 16, 2016  0932 ED EKG [CI]    Clinical Course User Index [CI] Duffy Bruce, MD    70 year old female with past medical history as above here with severe epigastric abdominal pain. On arrival, vital signs are remarkable for tachycardia and hypertension, likely due to pain and dehydration. Exam is as above. Initial differential is broad and includes  pancreatitis, gastritis, peptic ulcer disease, viral GI illness. She is status post cholecystectomy. Must also consider atypical colitis or diverticulitis. Will obtain broad screening labs as well as start fluids and CT scan. She does have a cardiac history but states her pain is all epigastric and EKG is unremarkable. Do not suspect ACS but will check screening troponin.  Labs, imaging as above. Pt significantly dehydrated and she remains intermittently tachycardic, but labs are o/w reassuring. CT scan negative. Pt persistently vomiting and nauseous despite IVF, antiemetics. She does have mild lactic acidosis but her leukocytosis is at baseline, she has no fevers, and is clinically well appearing and hypertensive - doubt sepsis at this time. Will admit for IVF, obs, and trending of LA.  Final Clinical Impressions(s) / ED Diagnoses   Final diagnoses:  Non-intractable vomiting with nausea, unspecified vomiting type  Dehydration  Epigastric abdominal pain    New Prescriptions Current Discharge Medication List       Duffy Bruce, MD 05/16/16 1811

## 2016-05-17 DIAGNOSIS — I2510 Atherosclerotic heart disease of native coronary artery without angina pectoris: Secondary | ICD-10-CM | POA: Diagnosis present

## 2016-05-17 DIAGNOSIS — E860 Dehydration: Secondary | ICD-10-CM | POA: Diagnosis not present

## 2016-05-17 DIAGNOSIS — Z85038 Personal history of other malignant neoplasm of large intestine: Secondary | ICD-10-CM | POA: Diagnosis not present

## 2016-05-17 DIAGNOSIS — Z8249 Family history of ischemic heart disease and other diseases of the circulatory system: Secondary | ICD-10-CM | POA: Diagnosis not present

## 2016-05-17 DIAGNOSIS — I10 Essential (primary) hypertension: Secondary | ICD-10-CM | POA: Diagnosis present

## 2016-05-17 DIAGNOSIS — G2581 Restless legs syndrome: Secondary | ICD-10-CM | POA: Diagnosis present

## 2016-05-17 DIAGNOSIS — N179 Acute kidney failure, unspecified: Secondary | ICD-10-CM | POA: Diagnosis present

## 2016-05-17 DIAGNOSIS — Z823 Family history of stroke: Secondary | ICD-10-CM | POA: Diagnosis not present

## 2016-05-17 DIAGNOSIS — K219 Gastro-esophageal reflux disease without esophagitis: Secondary | ICD-10-CM | POA: Diagnosis present

## 2016-05-17 DIAGNOSIS — Z7982 Long term (current) use of aspirin: Secondary | ICD-10-CM | POA: Diagnosis not present

## 2016-05-17 DIAGNOSIS — F329 Major depressive disorder, single episode, unspecified: Secondary | ICD-10-CM | POA: Diagnosis present

## 2016-05-17 DIAGNOSIS — Z79891 Long term (current) use of opiate analgesic: Secondary | ICD-10-CM | POA: Diagnosis not present

## 2016-05-17 DIAGNOSIS — Z7951 Long term (current) use of inhaled steroids: Secondary | ICD-10-CM | POA: Diagnosis not present

## 2016-05-17 DIAGNOSIS — Z833 Family history of diabetes mellitus: Secondary | ICD-10-CM | POA: Diagnosis not present

## 2016-05-17 DIAGNOSIS — Z9049 Acquired absence of other specified parts of digestive tract: Secondary | ICD-10-CM | POA: Diagnosis not present

## 2016-05-17 DIAGNOSIS — R112 Nausea with vomiting, unspecified: Secondary | ICD-10-CM | POA: Diagnosis not present

## 2016-05-17 DIAGNOSIS — M797 Fibromyalgia: Secondary | ICD-10-CM | POA: Diagnosis present

## 2016-05-17 DIAGNOSIS — E785 Hyperlipidemia, unspecified: Secondary | ICD-10-CM | POA: Diagnosis present

## 2016-05-17 LAB — LACTIC ACID, PLASMA: Lactic Acid, Venous: 1.2 mmol/L (ref 0.5–1.9)

## 2016-05-17 LAB — CBC
HEMATOCRIT: 41.1 % (ref 36.0–46.0)
Hemoglobin: 13.6 g/dL (ref 12.0–15.0)
MCH: 28.5 pg (ref 26.0–34.0)
MCHC: 33.1 g/dL (ref 30.0–36.0)
MCV: 86.2 fL (ref 78.0–100.0)
PLATELETS: 305 10*3/uL (ref 150–400)
RBC: 4.77 MIL/uL (ref 3.87–5.11)
RDW: 13.6 % (ref 11.5–15.5)
WBC: 11.5 10*3/uL — ABNORMAL HIGH (ref 4.0–10.5)

## 2016-05-17 LAB — COMPREHENSIVE METABOLIC PANEL
ALBUMIN: 3.4 g/dL — AB (ref 3.5–5.0)
ALT: 23 U/L (ref 14–54)
AST: 21 U/L (ref 15–41)
Alkaline Phosphatase: 72 U/L (ref 38–126)
Anion gap: 10 (ref 5–15)
BUN: 9 mg/dL (ref 6–20)
CHLORIDE: 101 mmol/L (ref 101–111)
CO2: 26 mmol/L (ref 22–32)
CREATININE: 0.91 mg/dL (ref 0.44–1.00)
Calcium: 8.9 mg/dL (ref 8.9–10.3)
GFR calc Af Amer: >60 mL/min (ref >60)
GFR calc non Af Amer: >60 mL/min (ref >60)
GLUCOSE: 134 mg/dL — AB (ref 65–99)
POTASSIUM: 3.4 mmol/L — AB (ref 3.5–5.1)
Sodium: 137 mmol/L (ref 135–145)
Total Bilirubin: 0.7 mg/dL (ref 0.3–1.2)
Total Protein: 6.3 g/dL — ABNORMAL LOW (ref 6.5–8.1)

## 2016-05-17 LAB — CORTISOL-AM, BLOOD: Cortisol - AM: 22.8 ug/dL — ABNORMAL HIGH (ref 6.7–22.6)

## 2016-05-17 MED ORDER — PROCHLORPERAZINE EDISYLATE 5 MG/ML IJ SOLN: 10.0000 mg | Freq: Four times a day (QID) | INTRAMUSCULAR | Status: DC | PRN

## 2016-05-17 MED ORDER — MILNACIPRAN HCL 50 MG PO TABS
50.0000 mg | ORAL_TABLET | Freq: Every day | ORAL | Status: DC
  Administered 2016-05-18 – 2016-05-19 (×2): 50 mg via ORAL
  Filled 2016-05-17 (×3): qty 1

## 2016-05-17 MED ORDER — METOCLOPRAMIDE HCL 5 MG/ML IJ SOLN
5.0000 mg | Freq: Four times a day (QID) | INTRAMUSCULAR | Status: DC | PRN
  Administered 2016-05-17: 5 mg via INTRAVENOUS
  Filled 2016-05-17: qty 2

## 2016-05-17 MED ORDER — SODIUM CHLORIDE 0.9 % IV SOLN
INTRAVENOUS | Status: DC
  Administered 2016-05-17: 75 mL/h via INTRAVENOUS
  Administered 2016-05-17 – 2016-05-18 (×2): via INTRAVENOUS

## 2016-05-17 NOTE — Progress Notes (Signed)
PROGRESS NOTE    Angela [name]  [id number] DOB: [birth year] DOA: 05/16/2016 PCP: [name], MD  Brief Narrative: Angela [name] is a 70 y.o. female 's previous history of hypertension, nonobstructive coronary artery disease by cath in July 2017, hyperlipidemia, chronic cervicalgia, status post multiple cervical spine surgeries, restless leg syndrome who presented to the emergency department with complaints of recurrent severe upper abdominal pain, intractable nausea and vomiting. Patient reports onset of symptoms approximately 3 days ago and initially they started as mild, but were gradually escalating to burning severe epigastric pain and frequent nonbloody, nonbilious emesis with inability to tolerate sips of water. Patient reported that during the last 48 hours she was unable to follow during anything. She denied diarrhea or constipation. aLthough in general she is more prone to being constipated. Chronic low grade temperature and upper 99-100 degrees, but never had it greater than 100.41F Patient reports that this is a recurrent problem and she was seen in an high [facility], on the round upper GI endoscopy that didn't reveal any significant abnormality. She's being followed by a gastroenterologist from the [place]:  Dehydration associated with intractable nausea and vomiting -continue IVF, supportive care -clears, advance diet as tol  Chronic nausea and vomiting - history of cholecystectomy and normal CT scan  - right upper quadrant ultrasound was ordered and revealed increased hepatic echotexture compatible with fatty infiltrative changes, no discrete hepatic mas, normal appearing common bile duct  - recent EGD at [facility] was normal - check random cortisol - suspect med induced, cut down Wellbutrin and Savella  AKI  - creatinine ion admission 1.26 and the last normal creatinine of 0.96 in September of this year -improved with hydration,  cozaar on hold  Hypertension  -improved  CAD - patient is free of pain, stable Continue home medications and monitor her symptoms  DVT prophylaxis: Heparin  Code Status: Full Family Communication: daughter at bedside  Disposition Plan: Home when tolerating PO  Subjective: Still with N/V, long standing  Objective: Vitals:   05/16/16 1422 05/16/16 2147 05/17/16 0450 05/17/16 0950  BP: (!) 183/87 135/79 (!) 153/73 (!) 155/78  Pulse: 95 65 68 79  Resp: 20 20 19 16   Temp: 98.8 F (37.1 C) 98.8 F (37.1 C) 98.6 F (37 C) 98.3 F (36.8 C)  TempSrc: Oral Oral Oral Oral  SpO2: 100% 97% 95% 98%  Weight: 76.7 kg (169 lb 1.6 oz) 76.7 kg (169 lb 1.5 oz)    Height: 5\' 5"  (1.651 m)       Intake/Output Summary (Last 24 hours) at 05/17/16 1115 Last data filed at 05/17/16 0951  Gross per 24 hour  Intake          1904.59 ml  Output              900 ml  Net          1004.59 ml   Filed Weights   05/16/16 0852 05/16/16 1422 05/16/16 2147  Weight: 79.8 kg (176 lb) 76.7 kg (169 lb 1.6 oz) 76.7 kg (169 lb 1.5 oz)    Examination:  General exam: Appears calm and comfortable, AAOx3 Respiratory system: Clear to auscultation. Respiratory effort normal. Cardiovascular system: S1 & S2 heard, RRR. No JVD, murmurs, rubs, gallops or clicks. No pedal edema. Gastrointestinal system: Abdomen is nondistended, soft and nontender.  Normal bowel sounds heard. Central nervous system: Alert and oriented. No focal neurological deficits. Extremities: Symmetric 5 x 5 power. Skin:  No rashes, lesions or ulcers Psychiatry: Judgement and insight appear normal. Mood & affect appropriate.     Data Reviewed: I have personally reviewed following labs and imaging studies  CBC:  Recent Labs Lab 05/16/16 0925 05/17/16 0439  WBC 13.4* 11.5*  NEUTROABS 10.3*  --   HGB 16.8* 13.6  HCT 48.2* 41.1  MCV 84.7 86.2  PLT 316 [id number]   Basic Metabolic Panel:  Recent Labs Lab 05/16/16 0925 05/17/16 0439    NA 139 137  K 3.7 3.4*  CL 101 101  CO2 24 26  GLUCOSE 165* 134*  BUN 21* 9  CREATININE 1.26* 0.91  CALCIUM 9.8 8.9   GFR: Estimated Creatinine Clearance: 59.8 mL/min (by C-G formula based on SCr of 0.91 mg/dL). Liver Function Tests:  Recent Labs Lab 05/16/16 0925 05/17/16 0439  AST 28 21  ALT 32 23  ALKPHOS 95 72  BILITOT 1.0 0.7  PROT 8.0 6.3*  ALBUMIN 4.7 3.4*    Recent Labs Lab 05/16/16 0925  LIPASE 23   No results for input(s): AMMONIA in the last 168 hours. Coagulation Profile: No results for input(s): INR, PROTIME in the last 168 hours. Cardiac Enzymes: No results for input(s): CKTOTAL, CKMB, CKMBINDEX, TROPONINI in the last 168 hours. BNP (last 3 results) No results for input(s): PROBNP in the last 8760 hours. HbA1C: No results for input(s): HGBA1C in the last 72 hours. CBG: No results for input(s): GLUCAP in the last 168 hours. Lipid Profile: No results for input(s): CHOL, HDL, LDLCALC, TRIG, CHOLHDL, LDLDIRECT in the last 72 hours. Thyroid Function Tests: No results for input(s): TSH, T4TOTAL, FREET4, T3FREE, THYROIDAB in the last 72 hours. Anemia Panel: No results for input(s): VITAMINB12, FOLATE, FERRITIN, TIBC, IRON, RETICCTPCT in the last 72 hours. Urine analysis:    Component Value Date/Time   COLORURINE YELLOW 05/16/2016 [place] 05/16/2016 1207   LABSPEC 1.041 (H) 05/16/2016 1207   PHURINE 7.0 05/16/2016 [place] 05/16/2016 1207   HGBUR SMALL (A) 05/16/2016 1207   BILIRUBINUR NEGATIVE 05/16/2016 1207   [place] 05/16/2016 1207   PROTEINUR 30 (A) 05/16/2016 1207   UROBILINOGEN 1.0 12/24/2010 1437   NITRITE NEGATIVE 05/16/2016 1207   LEUKOCYTESUR SMALL (A) 05/16/2016 1207   Sepsis Labs: @LABRCNTIP (procalcitonin:4,lacticidven:4)  )No results found for this or any previous visit (from the past 240 hour(s)).       Radiology Studies: Ct Abdomen Pelvis W Contrast  Result Date:  05/16/2016 CLINICAL DATA:  Abdominal pain with vomiting for 3 days. Dehydration. EXAM: CT ABDOMEN AND PELVIS WITH CONTRAST TECHNIQUE: Multidetector CT imaging of the abdomen and pelvis was performed using the standard protocol following bolus administration of intravenous contrast. CONTRAST:  27mL ISOVUE-300 IOPAMIDOL (ISOVUE-300) INJECTION 61% COMPARISON:  12/11/2014 FINDINGS: Lower chest: Subsegmental atelectasis and scarring are present in the right lower lobe. Hepatobiliary: Diffusely decreased attenuation of the liver consistent with steatosis. Prior cholecystectomy. Unchanged mild common bile duct dilatation. Pancreas: Slight dilatation of the pancreatic duct in the pancreatic head measures 4 mm, slightly decreased from prior. No evidence of pancreatic mass, atrophy, or inflammation. Spleen: Unremarkable. Adrenals/Urinary Tract: Unremarkable adrenal glands. Small low-density lesions in both kidneys are similar to the prior study and too small to fully characterize but is likely represent cysts. No renal calculi or hydronephrosis is seen. The bladder is unremarkable. Stomach/Bowel: Tiny sliding hiatal hernia as previously seen. Postsurgical changes again noted with an anastomosis in the distal descending colon. No evidence of bowel obstruction or  wall thickening. The appendix is unremarkable. Vascular/Lymphatic: Abdominal aortic atherosclerosis without aneurysm. No enlarged abdominal or pelvic lymph nodes. Reproductive: Uterus is absent. Left ovary is not identified. Right ovary is unremarkable. Other: No intraperitoneal free fluid. Small fat-containing periumbilical hernia is unchanged. Musculoskeletal: Thoracolumbar disc degeneration. Unchanged L2 inferior endplate Schmorl's node. IMPRESSION: 1. No acute abnormality identified in the abdomen or pelvis. 2. Hepatic steatosis. 3. Aortic atherosclerosis. Electronically Signed   By: Logan Bores M.D.   On: 05/16/2016 11:09   US Abdomen Limited Ruq  Result  Date: 05/16/2016 CLINICAL DATA:  Right upper quadrant pain for the past 2 days. Previous cholecystectomy. History of nonalcoholic steatohepatitis EXAM: US ABDOMEN LIMITED - RIGHT UPPER QUADRANT COMPARISON:  CT scan of the abdomen and pelvis of today's date FINDINGS: Gallbladder: The gallbladder is surgically absent. Common bile duct: Diameter: 7 mm Liver: The hepatic echotexture is increased diffusely. There is no focal mass or ductal dilation. The surface contour of the liver is smooth. IMPRESSION: Increased hepatic echotexture compatible with fatty infiltrative change. No discrete hepatic mass. Normal appearing common bile duct. The gallbladder is surgically absent. Electronically Signed   By: David  Martinique M.D.   On: 05/16/2016 16:25        Scheduled Meds: . aspirin EC  81 mg Oral Daily  . buPROPion  150 mg Oral QPM  . diltiazem  240 mg Oral Daily  . diphenhydrAMINE  25 mg Intravenous Once  . heparin  5,000 Units Subcutaneous Q8H  . isosorbide mononitrate  60 mg Oral Daily  . LORazepam  2 mg Oral QHS  . lubiprostone  24 mcg Oral BID  . metoprolol tartrate  25 mg Oral BID  . Milnacipran  50 mg Oral Daily  . pantoprazole  40 mg Oral Daily  . rOPINIRole  1 mg Oral q morning - 10a  . rOPINIRole  2 mg Oral QHS  . sodium chloride flush  3 mL Intravenous Q12H  . vitamin B-12  100 mcg Oral Daily   Continuous Infusions: . sodium chloride       LOS: 0 days    Time spent: 35min    Domenic Polite, MD [facility] Pager [phone]  If 7PM-7AM, please contact night-coverage [email or web address] Password [email or web address] 05/17/2016, 11:15 AM

## 2016-05-18 LAB — HEMOGLOBIN A1C
Hgb A1c MFr Bld: 6 % — ABNORMAL HIGH (ref 4.8–5.6)
MEAN PLASMA GLUCOSE: 126 mg/dL

## 2016-05-18 LAB — ACTH: C206 ACTH: 6.5 pg/mL — ABNORMAL LOW (ref 7.2–63.3)

## 2016-05-18 LAB — MRSA PCR SCREENING: MRSA by PCR: NEGATIVE

## 2016-05-18 MED ORDER — BUPROPION HCL ER (XL) 150 MG PO TB24: 150.0000 mg | ORAL_TABLET | Freq: Every day | ORAL | Status: DC

## 2016-05-18 MED ORDER — LOPERAMIDE HCL 2 MG PO CAPS
2.0000 mg | ORAL_CAPSULE | Freq: Once | ORAL | Status: AC
  Administered 2016-05-18: 2 mg via ORAL
  Filled 2016-05-18: qty 1

## 2016-05-18 MED ORDER — ONDANSETRON HCL 4 MG PO TABS: 4.0000 mg | ORAL_TABLET | ORAL | Status: DC | PRN

## 2016-05-18 MED ORDER — ONDANSETRON HCL 4 MG/2ML IJ SOLN: 4.0000 mg | Freq: Four times a day (QID) | INTRAMUSCULAR | Status: DC | PRN

## 2016-05-18 MED ORDER — ONDANSETRON 4 MG PO TBDP: 4.0000 mg | ORAL_TABLET | Freq: Four times a day (QID) | ORAL | 0 refills | Status: DC | PRN

## 2016-05-18 MED ORDER — MILNACIPRAN HCL 100 MG PO TABS: 50.0000 mg | ORAL_TABLET | Freq: Every day | ORAL | Status: DC

## 2016-05-18 MED ORDER — ONDANSETRON HCL 4 MG PO TABS
4.0000 mg | ORAL_TABLET | ORAL | Status: DC | PRN
  Administered 2016-05-18: 4 mg via ORAL
  Filled 2016-05-18: qty 1

## 2016-05-18 MED ORDER — ONDANSETRON HCL 4 MG/2ML IJ SOLN
4.0000 mg | INTRAMUSCULAR | Status: DC | PRN
  Administered 2016-05-18 (×2): 4 mg via INTRAVENOUS
  Filled 2016-05-18 (×2): qty 2

## 2016-05-18 NOTE — Progress Notes (Signed)
Patients IV was leaking and removed. I asked Dr. Broadus John if she would like another one to be placed. Orders to leave IV out as of right now. Will continue to monitor.

## 2016-05-18 NOTE — Progress Notes (Signed)
The patient has had 5 episodes of diarrhea and is complaining of nausea. Dr. Broadus John notified. Ordered to hold off on discharge today. Also, ordered a one time dose of 2 mg Imodium Po and to place PIV and continue NS at 75 ml/hr. Orders placed and followed. Will continue to monitor.

## 2016-05-18 NOTE — Discharge Summary (Signed)
Physician Discharge Summary  Angela [name] [id number] DOB: Jul 27, 1945 DOA: 05/16/2016  PCP: [name], MD  Admit date: 05/16/2016 Discharge date: 05/18/2016  Time spent: 45 minutes  Recommendations for Outpatient Follow-up:  1. PCP Dr.Morrow in 1 week, weaning off Wellbutrin and Savella due to Long standing nausea and vomiting which may be medication induced   Discharge Diagnoses:    Nausea and Vomiting   Depression   Fibromyalgia   Dehydration   Chronic nausea and vomiting   Family history of early CAD   Hyperlipidemia   CAD (coronary artery disease)   Discharge Condition: stable  Diet recommendation: soft bland diet advance as tolerated  Filed Weights   05/16/16 1422 05/16/16 2147 05/17/16 2126  Weight: 76.7 kg (169 lb 1.6 oz) 76.7 kg (169 lb 1.5 oz) 77.5 kg (170 lb 13.7 oz)    History of present illness:  Angela [name] is a 70 y.o. female 's previous history of hypertension, nonobstructive coronary artery disease by cath in July 2017, hyperlipidemia, chronic cervicalgia, status post multiple cervical spinesurgeries, restless leg syndromewho presented to the emergency department with complaints of recurrent severe upper abdominal pain, intractable nausea and vomiting. Patient reports onset of symptoms approximately 3 days ago and initially they started as mild, but were gradually escalating to burning severe epigastric pain and frequent nonbloody, nonbilious emesis with inability to tolerate sips of water.   Hospital Course:    Dehydration associated with intractable nausea and vomiting -improved with IVF, supportive care  Chronic nausea and vomiting -pt reports this is going on intermittently for 3years - history of cholecystectomy and normal CT Abd pelvis scan on admission - right upper quadrant ultrasound was ordered and revealed increased hepatic echotexture compatible with fatty infiltrative changes, no discrete hepatic mas, normal appearing common bile  duct  - recent EGD at [facility] was normal - random cortisol was normal - suspect medication induced, she is on maximal dose of wellbutrin at 450mg  which can cause N/V in about a third of patients and also on Savella which is also known to cause nausea and vomiting, I have cut these down with plans to wean down and off as tolerated -please consider Low dose Cymbalta as needed for Depression at follow up which can also help with her chronic pain  AKI - creatinine ion admission 1.26 and the last normal creatinine of 0.96 in September of this year -improved with hydration, cozaar resumed  Hypertension  -improved  CAD - stable Continue home medications  Discharge Exam: Vitals:   05/18/16 1000 05/18/16 1038  BP: 118/77 (!) 177/88  Pulse: 90 (!) 111  Resp: 20   Temp: 98.6 F (37 C)     General: AAOx3 Cardiovascular: S1S2/RRR Respiratory: CTAB  Discharge Instructions   Discharge Instructions    Discharge instructions    Complete by:  As directed    Soft diet advance as tolerated   Discharge instructions    Complete by:  As directed    Bland/soft diet advance as tolerated   Increase activity slowly    Complete by:  As directed    Increase activity slowly    Complete by:  As directed      Current Discharge Medication List    CONTINUE these medications which have CHANGED   Details  buPROPion (WELLBUTRIN XL) 150 MG 24 hr tablet Take 1 tablet (150 mg total) by mouth at bedtime. Take for a week at lowered dose and then just wean off if possible  Milnacipran HCl (SAVELLA) 100 MG TABS tablet Take 0.5 tablets (50 mg total) by mouth daily. For 3days then STOP    ondansetron (ZOFRAN-ODT) 4 MG disintegrating tablet Take 1 tablet (4 mg total) by mouth every 6 (six) hours as needed for nausea or vomiting. Qty: 30 tablet, Refills: 0      CONTINUE these medications which have NOT CHANGED   Details  AMITIZA 24 MCG capsule Take 1 capsule by mouth 2 (two) times daily.     aspirin EC 81 MG tablet Take 81 mg by mouth daily.    diltiazem (DILACOR XR) 240 MG 24 hr capsule Take 1 capsule (240 mg total) by mouth daily. Qty: 90 capsule, Refills: 3    esomeprazole (NEXIUM) 40 MG capsule Take 40 mg by mouth 2 (two) times daily before a meal.    fluticasone (FLONASE) 50 MCG/ACT nasal spray Place 2 sprays into both nostrils daily as needed for allergies or rhinitis.     isosorbide mononitrate (IMDUR) 60 MG 24 hr tablet Take 1 tablet (60 mg total) by mouth daily. Qty: 90 tablet, Refills: 3    LINZESS 72 MCG capsule Take 72 mcg by mouth daily as needed.    LORazepam (ATIVAN) 2 MG tablet Take 2 mg by mouth at bedtime.     losartan (COZAAR) 25 MG tablet Take 1 tablet (25 mg total) by mouth daily. Qty: 90 tablet, Refills: 3    metoprolol tartrate (LOPRESSOR) 25 MG tablet Take 1 tablet (25 mg total) by mouth 2 (two) times daily. Qty: 180 tablet, Refills: 3    omega-3 acid ethyl esters (LOVAZA) 1 g capsule Take 1 g by mouth 2 (two) times daily.    Oxycodone HCl 10 MG TABS Take 10 mg by mouth every 6 (six) hours.    rOPINIRole (REQUIP) 1 MG tablet Take 1-2 mg by mouth See admin instructions. ONE TABLET  IN AM TWO TABLETS IN PM     rosuvastatin (CRESTOR) 20 MG tablet Take 1 tablet (20 mg total) by mouth daily. Qty: 90 tablet, Refills: 3    tiZANidine (ZANAFLEX) 4 MG capsule Take 4 mg by mouth every 8 (eight) hours as needed for muscle spasms.    vitamin B-12 (CYANOCOBALAMIN) 100 MCG tablet Take 100 mcg by mouth daily.    nitroGLYCERIN (NITROSTAT) 0.4 MG SL tablet Place 1 tablet (0.4 mg total) under the tongue every 5 (five) minutes as needed for chest pain. Qty: 25 tablet, Refills: 3       Allergies  Allergen Reactions  . Norvasc [Amlodipine Besylate] Swelling  . Phenergan [Promethazine] Anaphylaxis  . Timentin [Ticarcillin-Pot Clavulanate] Anaphylaxis    Has patient had a PCN reaction causing immediate rash, facial/tongue/throat swelling, SOB or  lightheadedness with hypotension: Yes Has patient had a PCN reaction causing severe rash involving mucus membranes or skin necrosis: Yes Has patient had a PCN reaction that required hospitalization Yes Has patient had a PCN reaction occurring within the last 10 years: No If all of the above answers are "NO", then may proceed with Cephalosporin use.    Follow-up Information    MORROW, Marjory Lies, MD. Schedule an appointment as soon as possible for a visit in 1 week(s).   Specialty:  Family Medicine Why:  Discuss with Dr.Morrow abt Cymbalta for chronic pain and depression at follow up if needed as Wellbutrin weaned  Contact information: [address] [place] [address] [phone]            The results of  significant diagnostics from this hospitalization (including imaging, microbiology, ancillary and laboratory) are listed below for reference.    Significant Diagnostic Studies: Ct Abdomen Pelvis W Contrast  Result Date: 05/16/2016 CLINICAL DATA:  Abdominal pain with vomiting for 3 days. Dehydration. EXAM: CT ABDOMEN AND PELVIS WITH CONTRAST TECHNIQUE: Multidetector CT imaging of the abdomen and pelvis was performed using the standard protocol following bolus administration of intravenous contrast. CONTRAST:  58mL ISOVUE-300 IOPAMIDOL (ISOVUE-300) INJECTION 61% COMPARISON:  12/11/2014 FINDINGS: Lower chest: Subsegmental atelectasis and scarring are present in the right lower lobe. Hepatobiliary: Diffusely decreased attenuation of the liver consistent with steatosis. Prior cholecystectomy. Unchanged mild common bile duct dilatation. Pancreas: Slight dilatation of the pancreatic duct in the pancreatic head measures 4 mm, slightly decreased from prior. No evidence of pancreatic mass, atrophy, or inflammation. Spleen: Unremarkable. Adrenals/Urinary Tract: Unremarkable adrenal glands. Small low-density lesions in both kidneys are similar to the prior study and too small to  fully characterize but is likely represent cysts. No renal calculi or hydronephrosis is seen. The bladder is unremarkable. Stomach/Bowel: Tiny sliding hiatal hernia as previously seen. Postsurgical changes again noted with an anastomosis in the distal descending colon. No evidence of bowel obstruction or wall thickening. The appendix is unremarkable. Vascular/Lymphatic: Abdominal aortic atherosclerosis without aneurysm. No enlarged abdominal or pelvic lymph nodes. Reproductive: Uterus is absent. Left ovary is not identified. Right ovary is unremarkable. Other: No intraperitoneal free fluid. Small fat-containing periumbilical hernia is unchanged. Musculoskeletal: Thoracolumbar disc degeneration. Unchanged L2 inferior endplate Schmorl's node. IMPRESSION: 1. No acute abnormality identified in the abdomen or pelvis. 2. Hepatic steatosis. 3. Aortic atherosclerosis. Electronically Signed   By: Logan Bores M.D.   On: 05/16/2016 11:09   US Abdomen Limited Ruq  Result Date: 05/16/2016 CLINICAL DATA:  Right upper quadrant pain for the past 2 days. Previous cholecystectomy. History of nonalcoholic steatohepatitis EXAM: US ABDOMEN LIMITED - RIGHT UPPER QUADRANT COMPARISON:  CT scan of the abdomen and pelvis of today's date FINDINGS: Gallbladder: The gallbladder is surgically absent. Common bile duct: Diameter: 7 mm Liver: The hepatic echotexture is increased diffusely. There is no focal mass or ductal dilation. The surface contour of the liver is smooth. IMPRESSION: Increased hepatic echotexture compatible with fatty infiltrative change. No discrete hepatic mass. Normal appearing common bile duct. The gallbladder is surgically absent. Electronically Signed   By: David  Martinique M.D.   On: 05/16/2016 16:25    Microbiology: Recent Results (from the past 240 hour(s))  MRSA PCR Screening     Status: None   Collection Time: 05/18/16  2:09 AM  Result Value Ref Range Status   MRSA by PCR NEGATIVE NEGATIVE Final     Comment:        The GeneXpert MRSA Assay (FDA approved for NASAL specimens only), is one component of a comprehensive MRSA colonization surveillance program. It is not intended to diagnose MRSA infection nor to guide or monitor treatment for MRSA infections.      Labs: Basic Metabolic Panel:  Recent Labs Lab 05/16/16 0925 05/17/16 0439  NA 139 137  K 3.7 3.4*  CL 101 101  CO2 24 26  GLUCOSE 165* 134*  BUN 21* 9  CREATININE 1.26* 0.91  CALCIUM 9.8 8.9   Liver Function Tests:  Recent Labs Lab 05/16/16 0925 05/17/16 0439  AST 28 21  ALT 32 23  ALKPHOS 95 72  BILITOT 1.0 0.7  PROT 8.0 6.3*  ALBUMIN 4.7 3.4*    Recent Labs Lab 05/16/16 0925  LIPASE  23   No results for input(s): AMMONIA in the last 168 hours. CBC:  Recent Labs Lab 05/16/16 0925 05/17/16 0439  WBC 13.4* 11.5*  NEUTROABS 10.3*  --   HGB 16.8* 13.6  HCT 48.2* 41.1  MCV 84.7 86.2  PLT 316 305   Cardiac Enzymes: No results for input(s): CKTOTAL, CKMB, CKMBINDEX, TROPONINI in the last 168 hours. BNP: BNP (last 3 results) No results for input(s): BNP in the last 8760 hours.  ProBNP (last 3 results) No results for input(s): PROBNP in the last 8760 hours.  CBG: No results for input(s): GLUCAP in the last 168 hours.     SignedDomenic Polite MD.  [facility] 05/18/2016, 2:19 PM

## 2016-05-19 ENCOUNTER — Ambulatory Visit: Payer: Medicare Other | Admitting: Cardiology

## 2016-05-19 LAB — CBC
HCT: 39.1 % (ref 36.0–46.0)
HEMOGLOBIN: 13.4 g/dL (ref 12.0–15.0)
MCH: 29.2 pg (ref 26.0–34.0)
MCHC: 34.3 g/dL (ref 30.0–36.0)
MCV: 85.2 fL (ref 78.0–100.0)
Platelets: 265 10*3/uL (ref 150–400)
RBC: 4.59 MIL/uL (ref 3.87–5.11)
RDW: 13.4 % (ref 11.5–15.5)
WBC: 6.5 10*3/uL (ref 4.0–10.5)

## 2016-05-19 LAB — BASIC METABOLIC PANEL
Anion gap: 9 (ref 5–15)
BUN: 8 mg/dL (ref 6–20)
CHLORIDE: 102 mmol/L (ref 101–111)
CO2: 30 mmol/L (ref 22–32)
CREATININE: 1.11 mg/dL — AB (ref 0.44–1.00)
Calcium: 8.9 mg/dL (ref 8.9–10.3)
GFR calc Af Amer: 57 mL/min — ABNORMAL LOW (ref >60)
GFR calc non Af Amer: 49 mL/min — ABNORMAL LOW (ref >60)
GLUCOSE: 112 mg/dL — AB (ref 65–99)
POTASSIUM: 3.6 mmol/L (ref 3.5–5.1)
SODIUM: 141 mmol/L (ref 135–145)

## 2016-05-19 MED ORDER — AMITIZA 24 MCG PO CAPS: 24.0000 ug | ORAL_CAPSULE | ORAL | Status: DC | PRN

## 2016-05-19 NOTE — Progress Notes (Signed)
Discharge instructions reviewed with pt and pt's husband and prescription given.  Pt verbalized understanding and had no questions.  Pt discharged in stable condition via wheelchair with husband.  Joah Patlan Lindsay   

## 2016-06-04 DIAGNOSIS — Z79899 Other long term (current) drug therapy: Secondary | ICD-10-CM | POA: Diagnosis not present

## 2016-06-04 DIAGNOSIS — Z79891 Long term (current) use of opiate analgesic: Secondary | ICD-10-CM | POA: Diagnosis not present

## 2016-06-04 DIAGNOSIS — M5412 Radiculopathy, cervical region: Secondary | ICD-10-CM | POA: Diagnosis not present

## 2016-06-04 DIAGNOSIS — M542 Cervicalgia: Secondary | ICD-10-CM | POA: Diagnosis not present

## 2016-06-04 DIAGNOSIS — G894 Chronic pain syndrome: Secondary | ICD-10-CM | POA: Diagnosis not present

## 2016-06-04 DIAGNOSIS — M5030 Other cervical disc degeneration, unspecified cervical region: Secondary | ICD-10-CM | POA: Diagnosis not present

## 2016-06-05 DIAGNOSIS — Z09 Encounter for follow-up examination after completed treatment for conditions other than malignant neoplasm: Secondary | ICD-10-CM | POA: Diagnosis not present

## 2016-06-05 DIAGNOSIS — I10 Essential (primary) hypertension: Secondary | ICD-10-CM | POA: Diagnosis not present

## 2016-06-05 DIAGNOSIS — Z1159 Encounter for screening for other viral diseases: Secondary | ICD-10-CM | POA: Diagnosis not present

## 2016-06-05 DIAGNOSIS — R112 Nausea with vomiting, unspecified: Secondary | ICD-10-CM | POA: Diagnosis not present

## 2016-06-05 DIAGNOSIS — R413 Other amnesia: Secondary | ICD-10-CM | POA: Diagnosis not present

## 2016-06-05 DIAGNOSIS — R419 Unspecified symptoms and signs involving cognitive functions and awareness: Secondary | ICD-10-CM | POA: Diagnosis not present

## 2016-06-05 DIAGNOSIS — M797 Fibromyalgia: Secondary | ICD-10-CM | POA: Diagnosis not present

## 2016-06-05 DIAGNOSIS — I2510 Atherosclerotic heart disease of native coronary artery without angina pectoris: Secondary | ICD-10-CM | POA: Diagnosis not present

## 2016-06-05 DIAGNOSIS — F329 Major depressive disorder, single episode, unspecified: Secondary | ICD-10-CM | POA: Diagnosis not present

## 2016-06-09 ENCOUNTER — Other Ambulatory Visit (HOSPITAL_COMMUNITY): Payer: Self-pay | Admitting: Physician Assistant

## 2016-06-09 DIAGNOSIS — K5903 Drug induced constipation: Secondary | ICD-10-CM | POA: Diagnosis not present

## 2016-06-09 DIAGNOSIS — R112 Nausea with vomiting, unspecified: Secondary | ICD-10-CM

## 2016-06-09 DIAGNOSIS — R1013 Epigastric pain: Secondary | ICD-10-CM

## 2016-06-09 DIAGNOSIS — G43A1 Cyclical vomiting, intractable: Secondary | ICD-10-CM | POA: Diagnosis not present

## 2016-06-11 ENCOUNTER — Ambulatory Visit (INDEPENDENT_AMBULATORY_CARE_PROVIDER_SITE_OTHER): Payer: Medicare Other | Admitting: Cardiology

## 2016-06-11 ENCOUNTER — Other Ambulatory Visit: Payer: Medicare Other

## 2016-06-11 VITALS — BP 124/76 | HR 86 | Ht 65.5 in | Wt 172.0 lb

## 2016-06-11 DIAGNOSIS — E782 Mixed hyperlipidemia: Secondary | ICD-10-CM

## 2016-06-11 DIAGNOSIS — Z789 Other specified health status: Secondary | ICD-10-CM

## 2016-06-11 DIAGNOSIS — I10 Essential (primary) hypertension: Secondary | ICD-10-CM | POA: Diagnosis not present

## 2016-06-11 DIAGNOSIS — E785 Hyperlipidemia, unspecified: Secondary | ICD-10-CM

## 2016-06-11 DIAGNOSIS — I2510 Atherosclerotic heart disease of native coronary artery without angina pectoris: Secondary | ICD-10-CM

## 2016-06-11 MED ORDER — METOPROLOL TARTRATE 25 MG PO TABS
25.0000 mg | ORAL_TABLET | Freq: Two times a day (BID) | ORAL | 3 refills | Status: DC
Start: 2016-06-11 — End: 2016-08-05

## 2016-06-11 NOTE — Progress Notes (Signed)
Cardiology Office Note    Date:  06/11/2016   ID:  Angela [name], DOB 10/27/1945, MRN [id number]  PCP:  [name], MD  Cardiologist: Dr. [name]  Chief complain: Chest pain.  History of Present Illness:  Angela [name] is a 71 y.o. female with history of hypertension, hyperlipidemia and strong family history of early CAD. Patient had exertional chest pain and underwent stress testing that was abnormal for possible ischemia in the inferior to inferolateral region. Cardiac catheterization revealed mild to moderate CAD with 30 and 40% proximal mid LAD stenosis and 70-75% ostial first diagonal, normal circumflex and smooth 25% mid RCA and a dominant RCA vessel, EF 55-65%. Nitrates in low-dose beta blockers were added to her medications.  03/11/2016 - 2 months follow-up, she states that overall her angina is slightly better, but she still gets retrosternal chest pain and fatigue with activities like carrying a basket of laundry. She just overall feels really tired. She has been compliant to her medicines, we tried to increase Crestor however she developed significant muscle pain and we had to cut back to 10 mg daily. No palpitations or syncope. No lower extremity edema orthopnea or paroxysmal nocturnal dyspnea.  06/11/2016 - patient is coming after 3 months, she has been doing well from cardiac standpoint however she was hospitalized during Christmas week for chronic and progressively worsening vomiting and explosive diarrhea. All of her medications have been discontinued and only losartan has been restarted. GIs is suspicious of gastric motility/emptying problem. And she is scheduled for gastric emptying study next week followed by GI outpatient visit. She has stable chest pain that's not related to exertion and feels like a mild pressure not associated with dizziness or shortness of breath.   Past Medical History:  Diagnosis Date  . Anxiety   . Cancer of sigmoid ([place])   . Depression  (emotion)   . DJD (degenerative joint disease)   . Fibromyalgia   . GERD (gastroesophageal reflux disease)   . Hemorrhoids   . Hyperlipidemia   . Hypertension   . IBS (irritable bowel syndrome)   . MRSA (methicillin resistant Staphylococcus aureus) septicemia ([place])    history of  . Over weight   . Pancreatitis   . RLS (restless legs syndrome)   . Tachycardia     Past Surgical History:  Procedure Laterality Date  . CARDIAC CATHETERIZATION [name] 12/26/2015   Procedure: Left Heart Cath and Coronary Angiography;  Surgeon: Troy Sine, MD;  Location: [facility];  Service: Cardiovascular;  Laterality: [name];  . child birth     Current Medications: Outpatient Medications Prior to Visit  Medication Sig Dispense Refill  . AMITIZA 24 MCG capsule Take 1 capsule (24 mcg total) by mouth as needed for constipation.    Marland Kitchen aspirin EC 81 MG tablet Take 81 mg by mouth daily.    . fluticasone (FLONASE) 50 MCG/ACT nasal spray Place 2 sprays into both nostrils daily as needed for allergies or rhinitis.     Marland Kitchen LINZESS 72 MCG capsule Take 72 mcg by mouth daily as needed.    Marland Kitchen LORazepam (ATIVAN) 2 MG tablet Take 2 mg by mouth at bedtime.     Marland Kitchen losartan (COZAAR) 25 MG tablet Take 1 tablet (25 mg total) by mouth daily. 90 tablet 3  . omega-3 acid ethyl esters (LOVAZA) 1 g capsule Take 1 g by mouth 2 (two) times daily.    . ondansetron (ZOFRAN-ODT) 4 MG disintegrating tablet Take 1 tablet (  4 mg total) by mouth every 6 (six) hours as needed for nausea or vomiting. 30 tablet 0  . Oxycodone HCl 10 MG TABS Take 10 mg by mouth every 6 (six) hours.    Marland Kitchen rOPINIRole (REQUIP) 1 MG tablet Take 1-2 mg by mouth See admin instructions. ONE TABLET  IN AM TWO TABLETS IN PM     . tiZANidine (ZANAFLEX) 4 MG capsule Take 4 mg by mouth every 8 (eight) hours as needed for muscle spasms.    . isosorbide mononitrate (IMDUR) 60 MG 24 hr tablet Take 1 tablet (60 mg total) by mouth daily. (Patient not taking: Reported on  06/11/2016) 90 tablet 3  . nitroGLYCERIN (NITROSTAT) 0.4 MG SL tablet Place 1 tablet (0.4 mg total) under the tongue every 5 (five) minutes as needed for chest pain. 25 tablet 3  . rosuvastatin (CRESTOR) 20 MG tablet Take 1 tablet (20 mg total) by mouth daily. (Patient not taking: Reported on 06/11/2016) 90 tablet 3  . buPROPion (WELLBUTRIN XL) 150 MG 24 hr tablet Take 1 tablet (150 mg total) by mouth at bedtime. Take for a week at lowered dose and then just wean off if possible    . diltiazem (DILACOR XR) 240 MG 24 hr capsule Take 1 capsule (240 mg total) by mouth daily. 90 capsule 3  . esomeprazole (NEXIUM) 40 MG capsule Take 40 mg by mouth 2 (two) times daily before a meal.    . metoprolol tartrate (LOPRESSOR) 25 MG tablet Take 1 tablet (25 mg total) by mouth 2 (two) times daily. (Patient not taking: Reported on 06/11/2016) 180 tablet 3  . Milnacipran HCl (SAVELLA) 100 MG TABS tablet Take 0.5 tablets (50 mg total) by mouth daily. For 3days then STOP    . vitamin B-12 (CYANOCOBALAMIN) 100 MCG tablet Take 100 mcg by mouth daily.     No facility-administered medications prior to visit.     Allergies:   Norvasc [amlodipine besylate]; Phenergan [promethazine]; and Timentin [ticarcillin-pot clavulanate]   Social History   Social History  . Marital status: Married    Spouse name: [name]  . Number of children: [name]  . Years of education: [name]   Social History Main Topics  . Smoking status: Never Smoker  . Smokeless tobacco: Never Used  . Alcohol use No  . Drug use: No  . Sexual activity: Not on file   Other Topics Concern  . Not on file   Social History Narrative  . No narrative on file    Family History:  The patient's family history includes Diabetes in her sister; Heart attack in her father; Heart disease in her mother; Hypertension in her mother and sister; Stroke in her father.   ROS:   Please see the history of present illness.    Review of Systems  Constitution: Negative.  HENT:  Negative.   Eyes: Negative.   Cardiovascular: Positive for chest pain.  Respiratory: Negative.   Hematologic/Lymphatic: Negative.   Musculoskeletal: Negative.  Negative for joint pain.  Gastrointestinal: Negative.   Genitourinary: Negative.   Neurological: Negative.    All other systems reviewed and are negative.   PHYSICAL EXAM:   VS:  BP 124/76   Pulse 86   Ht 5' 5.5" (1.664 m)   Wt 172 lb (78 kg)   SpO2 95%   BMI 28.19 kg/m   Physical Exam  GEN: Well nourished, well developed, in no acute distress  Neck: no JVD, carotid bruits, or masses Cardiac:RRR; no murmurs, rubs, or  gallops  Respiratory:  clear to auscultation bilaterally, normal work of breathing GI: soft, nontender, nondistended, + BS Ext: Right arm at cath site without hematoma or hemorrhage, good radial and brachial pulse, lower extremities without cyanosis, clubbing, or edema, Good distal pulses bilaterally Skin: warm and dry, no rash Neuro:  Alert and Oriented x 3, Strength and sensation are intact Psych: euthymic mood, full affect  Wt Readings from Last 3 Encounters:  06/11/16 172 lb (78 kg)  05/18/16 172 lb 9.9 oz (78.3 kg)  04/17/16 176 lb 8 oz (80.1 kg)      Studies/Labs Reviewed:   EKG:  EKG is Not ordered today.   Recent Labs: 05/17/2016: ALT 23 05/19/2016: BUN 8; Creatinine, Ser 1.11; Hemoglobin 13.4; Platelets 265; Potassium 3.6; Sodium 141   Lipid Panel    Component Value Date/Time   CHOL 150 02/20/2016 1205   TRIG 112 02/20/2016 1205   HDL 50 02/20/2016 1205   CHOLHDL 3.0 02/20/2016 1205   VLDL 22 02/20/2016 1205   LDLCALC 78 02/20/2016 1205    Additional studies/ records that were reviewed today include:   Nuclear stress test 12/05/15 Study Highlights   Nuclear stress EF: 74%.  Blood pressure demonstrated a hypertensive response to exercise.  There was no ST segment deviation noted during stress.  There is a medium defect of mild severity present in the basal inferior, basal  inferolateral, mid inferior and mid inferolateral location. The defect is reversible. This is a mild defect and could be explained by changes in diaphragmatic attenuation artifact but cannot rule out an area of ischemia.  This is an intermediate risk study.  The left ventricular ejection fraction is hyperdynamic (>65%).    Cardiac catheterization 12/26/15 Conclusion     Prox RCA lesion, 25 %stenosed.  Ost 1st Diag lesion, 70 %stenosed.  Prox LAD lesion, 40 %stenosed.  Ost LAD lesion, 30 %stenosed.  The left ventricular systolic function is normal.  LV end diastolic pressure is normal.  The left ventricular ejection fraction is 55-65% by visual estimate.   Normal to hyperdynamic LV function with an ejection fraction of 65%.   Mild-to-moderate CAD with 30 and 40% proximal and mid LAD stenoses with 70-75% ostial stenosis in the first diagonal branch of the LAD; normal left circumflex coronary artery; and smooth 25% mid RCA stenosis in a dominant RCA vessel.   RECOMMENDATION: An initial increased initial medical therapy trial is recommended.  On the patient's recent nuclear perfusion study, she was found to have possible ischemia in the inferior to inferolateral region and had relatively normal perfusion in the distribution of her stenosis involving the ostium of the diagonal vessel.  Nitrates and low-dose beta blocker will be added to her medical regimen.  In addition, Crestor will be titrated to 40 mg in attempt to be aggressive and induced potential plaque regression.  The patient will follow-up with Dr. [name].         ASSESSMENT:    1. Hyperlipidemia, unspecified hyperlipidemia type   2. Mixed hyperlipidemia   3. Essential hypertension   4. Statin intolerance   5. Coronary artery disease involving native coronary artery of native heart without angina pectoris     PLAN:  In order of problems listed above:  1. CAD - In October 2017 I have discussed this case with Dr.  Tamala Julian who reviewed her cardiac cath images and stated that her first diagonal vessel is very small and might be difficult to stent. Also because of its ostial lesion  it can cause damage to the adjacent LAD and he would also recommend just medical therapy. Patient agrees as she has high anxiety from procedures as two  of her friends died after cardiac catheterizations. At the time increased Imdur to 60 mg by mouth daily, her chest pains are very mild and atypical possibly related to her gastric motility problem. For now we'll hold aspirin and Crestor, we will follow her in 2 weeks and if her symptoms are improved we will try to restart dose she has known coronary artery disease.  2. Gastric motolity problems - all of her cardiac meds were discontinued losartan. Will restart metoprolol 25 mg by mouth twice a day, we will hold diltiazem and Imdur as her blood pressure is normal. For now we will hold aspirin and Crestor.  3. Essential hypertension  - currently normal blood pressure we will hold Imdur and Cardizem.   4. Hyperlipidemia - followed by Dr. supple :  "Pt's recent LDL of 78 mg/dL is still above the ASCVD goal of <70 mg/dL. Will increase Crestor to 20 mg daily to bring LDL to goal. Advised pt to call clinic if she experiences any intolerances to increased Crestor dose. If she is intolerant, will decrease the dose back to 10 mg and add Zetia 10 mg daily. Encouraged her to continue with lifestyle improvements. Patient to follow-up in clinic in 2 months for a fasting lipid panel on same day as OV with Dr [name]." We will not repeat her labs as she has been taking all her medicines 2 weeks ago, in 2 weeks at the follow-up will try to restart Crestor and in arrange for follow-up appointment with Dr. Onnie Graham at the lipid clinic.   Medication Adjustments/Labs and Tests Ordered: Current medicines are reviewed at length with the patient today.  Concerns regarding medicines are outlined above.  Medication  changes, Labs and Tests ordered today are listed in the Patient Instructions below. Patient Instructions  Medication Instructions:   STOP TAKING DILTIAZEM NOW   START TAKING METOPROLOL TARTRATE 25 MG TWICE DAILY    Follow-Up:  2 WEEKS WITH DAYNA DUNN PA-C IN OUR OFFICE       If you need a refill on your cardiac medications before your next appointment, please call your pharmacy.      Signed, Ena Dawley, MD  06/11/2016 10:03 AM    [address], [place], [place]  [address] Phone: [phone]; Fax: [phone]

## 2016-06-11 NOTE — Patient Instructions (Addendum)
Medication Instructions:   STOP TAKING DILTIAZEM NOW   STOP TAKING IMDUR NOW  START TAKING METOPROLOL TARTRATE 25 MG TWICE DAILY    Follow-Up:  2 WEEKS WITH DAYNA DUNN PA-C IN OUR OFFICE       If you need a refill on your cardiac medications before your next appointment, please call your pharmacy.

## 2016-06-16 ENCOUNTER — Ambulatory Visit (HOSPITAL_COMMUNITY)
Admission: RE | Admit: 2016-06-16 | Discharge: 2016-06-16 | Disposition: A | Discharge status: Home / Self Care | Payer: Medicare Other | Source: Ambulatory Visit | Attending: Physician Assistant | Admitting: Physician Assistant

## 2016-06-16 DIAGNOSIS — R112 Nausea with vomiting, unspecified: Secondary | ICD-10-CM | POA: Diagnosis not present

## 2016-06-16 DIAGNOSIS — R1013 Epigastric pain: Secondary | ICD-10-CM | POA: Insufficient documentation

## 2016-06-16 MED ORDER — TECHNETIUM TC 99M SULFUR COLLOID
2.0000 | Freq: Once | INTRAVENOUS | Status: AC | PRN
  Administered 2016-06-16: 2 via ORAL

## 2016-06-26 ENCOUNTER — Ambulatory Visit: Payer: Medicare Other | Admitting: Cardiology

## 2016-06-26 ENCOUNTER — Other Ambulatory Visit: Payer: Medicare Other

## 2016-07-02 DIAGNOSIS — M542 Cervicalgia: Secondary | ICD-10-CM | POA: Diagnosis not present

## 2016-07-02 DIAGNOSIS — G894 Chronic pain syndrome: Secondary | ICD-10-CM | POA: Diagnosis not present

## 2016-07-02 DIAGNOSIS — M5030 Other cervical disc degeneration, unspecified cervical region: Secondary | ICD-10-CM | POA: Diagnosis not present

## 2016-07-02 DIAGNOSIS — Z79891 Long term (current) use of opiate analgesic: Secondary | ICD-10-CM | POA: Diagnosis not present

## 2016-07-02 DIAGNOSIS — M5412 Radiculopathy, cervical region: Secondary | ICD-10-CM | POA: Diagnosis not present

## 2016-07-02 DIAGNOSIS — Z79899 Other long term (current) drug therapy: Secondary | ICD-10-CM | POA: Diagnosis not present

## 2016-07-07 DIAGNOSIS — R110 Nausea: Secondary | ICD-10-CM | POA: Diagnosis not present

## 2016-07-07 DIAGNOSIS — K5903 Drug induced constipation: Secondary | ICD-10-CM | POA: Diagnosis not present

## 2016-07-08 ENCOUNTER — Encounter: Payer: Self-pay | Admitting: Cardiology

## 2016-07-08 ENCOUNTER — Ambulatory Visit (INDEPENDENT_AMBULATORY_CARE_PROVIDER_SITE_OTHER): Payer: Medicare Other | Admitting: Cardiology

## 2016-07-08 VITALS — BP 148/88 | HR 89 | Ht 65.5 in | Wt 168.1 lb

## 2016-07-08 DIAGNOSIS — I10 Essential (primary) hypertension: Secondary | ICD-10-CM | POA: Diagnosis not present

## 2016-07-08 DIAGNOSIS — F329 Major depressive disorder, single episode, unspecified: Secondary | ICD-10-CM | POA: Diagnosis not present

## 2016-07-08 DIAGNOSIS — I2510 Atherosclerotic heart disease of native coronary artery without angina pectoris: Secondary | ICD-10-CM

## 2016-07-08 DIAGNOSIS — M797 Fibromyalgia: Secondary | ICD-10-CM | POA: Diagnosis not present

## 2016-07-08 DIAGNOSIS — R110 Nausea: Secondary | ICD-10-CM | POA: Diagnosis not present

## 2016-07-08 MED ORDER — ISOSORBIDE MONONITRATE ER 30 MG PO TB24: 30.0000 mg | ORAL_TABLET | Freq: Every day | ORAL | 1 refills | Status: DC

## 2016-07-08 MED ORDER — ROSUVASTATIN CALCIUM 20 MG PO TABS: 20.0000 mg | ORAL_TABLET | Freq: Every day | ORAL | 1 refills | Status: DC

## 2016-07-08 NOTE — Progress Notes (Signed)
07/08/2016 Angela [name]   [birth year]  [id number]  Primary Physician [name], MD Primary Cardiologist: Dr. [name]    Reason for Visit/CC: F/u for CAD / Medication adjustment   HPI:  The patient is a 71 y/o female, followed by Dr. [name] for CAD. She also has a h/o HTN and HLD. In 2017, she underwent f/u for exertional chest pain. NST was abnormal with possible ischemia in the inferior to inferolateral region. Cardiac catheterization revealed mild to moderate CAD with 30 and 40% proximal mid LAD stenosis and 70-75% ostial first diagonal, normal circumflex and smooth 25% mid RCA and a dominant RCA vessel, EF 55-65%. Given her stress test showed possible ischemia in the inferior to inferolateral region and had relatively normal perfusion in the distribution of her stenosis involving the ostium of the diagonal vessel, medical therapy was recommended. Nitrates and low dose BB was added to her regimen. Crestor was increased to 40 mg, however she did not tolerate the higher dose due to myalgias. This was reduced back down to 20 mg.   Most recently, she was admitted to [facility] under internal medicine given severe n/v and explosive diarrhea. GI was suspicious of gastric motility/emptying problems. As a result, all of her meds were held temporarily. She was however started back on Losartan by the time she saw Dr. [name] for post hospital f/u.  She was seen by Dr. [name] on 06/11/16. At that time, she complained of mild chest discomfort. Decision was mad to restart metoprolol. Her Imdur, Crestor and Cardizem were kept on hold.   She presents back to clinic today for f/u. She continues to do ok. GI symptom are improving. She saw the specialist today. She was started on a medication for constipation. If is felt that her chronic constipation from chronic opioid use is causing delayed gastric emptying. She denies any recurrent v/d. She is tolerating resumption of metoprolol ok but she notes that her BP has been  on the higher side, in the [id number] systolic. Her BP is 148/88 today. She denies CP but notes occasional mild exertional dyspnea with moderate to heavy physical activity.    Current Meds  Medication Sig  . aspirin EC 81 MG tablet Take 81 mg by mouth daily.  . Cyanocobalamin (B-12) 2500 MCG TABS Take 1 tablet by mouth daily.  . DULoxetine (CYMBALTA) 20 MG capsule Take 20 mg by mouth daily.  Marland Kitchen escitalopram (LEXAPRO) 5 MG tablet Take 1 tablet by mouth daily.   . fluticasone (FLONASE) 50 MCG/ACT nasal spray Place 2 sprays into both nostrils daily as needed for allergies or rhinitis.   Marland Kitchen LINZESS 72 MCG capsule Take 72 mcg by mouth daily as needed.  Marland Kitchen LORazepam (ATIVAN) 2 MG tablet Take 2 mg by mouth at bedtime.   Marland Kitchen losartan (COZAAR) 25 MG tablet Take 1 tablet (25 mg total) by mouth daily.  . metoprolol tartrate (LOPRESSOR) 25 MG tablet Take 1 tablet (25 mg total) by mouth 2 (two) times daily. (Patient taking differently: Take 25 mg by mouth daily. )  . Morphine-Naltrexone (EMBEDA) 30-1.2 MG CPCR Take 1 tablet by mouth 2 (two) times daily.  . nitroGLYCERIN (NITROSTAT) 0.4 MG SL tablet Place 1 tablet (0.4 mg total) under the tongue every 5 (five) minutes as needed for chest pain.  Marland Kitchen omega-3 acid ethyl esters (LOVAZA) 1 g capsule Take 1 g by mouth 2 (two) times daily.  . ondansetron (ZOFRAN-ODT) 4 MG disintegrating tablet Take 1 tablet (4 mg total) by mouth  every 6 (six) hours as needed for nausea or vomiting.  . pantoprazole (PROTONIX) 40 MG tablet Take 1 tablet by mouth daily.  Marland Kitchen rOPINIRole (REQUIP) 1 MG tablet Take 1-2 mg by mouth See admin instructions. ONE TABLET  IN AM TWO TABLETS IN PM   . SAVELLA TITRATION PACK 12.5 & 25 & 50 MG MISC as directed.  Marland Kitchen tiZANidine (ZANAFLEX) 4 MG capsule Take 4 mg by mouth every 8 (eight) hours as needed for muscle spasms.   Allergies  Allergen Reactions  . Norvasc [Amlodipine Besylate] Swelling  . Phenergan [Promethazine] Anaphylaxis  . Timentin [Ticarcillin-Pot  Clavulanate] Anaphylaxis    Has patient had a PCN reaction causing immediate rash, facial/tongue/throat swelling, SOB or lightheadedness with hypotension: Yes Has patient had a PCN reaction causing severe rash involving mucus membranes or skin necrosis: Yes Has patient had a PCN reaction that required hospitalization Yes Has patient had a PCN reaction occurring within the last 10 years: No If all of the above answers are "NO", then may proceed with Cephalosporin use.    Past Medical History:  Diagnosis Date  . Anxiety   . Cancer of sigmoid ([place])   . Depression (emotion)   . DJD (degenerative joint disease)   . Fibromyalgia   . GERD (gastroesophageal reflux disease)   . Hemorrhoids   . Hyperlipidemia   . Hypertension   . IBS (irritable bowel syndrome)   . MRSA (methicillin resistant Staphylococcus aureus) septicemia ([place])    history of  . Over weight   . Pancreatitis   . RLS (restless legs syndrome)   . Tachycardia    Family History  Problem Relation Age of Onset  . Hypertension Mother   . Heart disease Mother   . Heart attack Father   . Stroke Father   . Diabetes Sister   . Hypertension Sister   . Stroke    . Diabetes    . Heart disease     Past Surgical History:  Procedure Laterality Date  . CARDIAC CATHETERIZATION [name] 12/26/2015   Procedure: Left Heart Cath and Coronary Angiography;  Surgeon: Troy Sine, MD;  Location: [facility];  Service: Cardiovascular;  Laterality: [name];  . child birth     Social History   Social History  . Marital status: Married    Spouse name: [name]  . Number of children: [name]  . Years of education: [name]   Occupational History  . Not on file.   Social History Main Topics  . Smoking status: Never Smoker  . Smokeless tobacco: Never Used  . Alcohol use No  . Drug use: No  . Sexual activity: Not on file   Other Topics Concern  . Not on file   Social History Narrative  . No narrative on file     Review of  Systems: General: negative for chills, fever, night sweats or weight changes.  Cardiovascular: negative for chest pain, dyspnea on exertion, edema, orthopnea, palpitations, paroxysmal nocturnal dyspnea or shortness of breath Dermatological: negative for rash Respiratory: negative for cough or wheezing Urologic: negative for hematuria Abdominal: negative for nausea, vomiting, diarrhea, bright red blood per rectum, melena, or hematemesis Neurologic: negative for visual changes, syncope, or dizziness All other systems reviewed and are otherwise negative except as noted above.   Physical Exam:  Blood pressure (!) 148/88, pulse 89, height 5' 5.5" (1.664 m), weight 168 lb 1.9 oz (76.3 kg), SpO2 97 %.  General appearance: alert, cooperative and no distress Neck: no carotid  bruit and no JVD Lungs: clear to auscultation bilaterally Heart: regular rate and rhythm, S1, S2 normal, no murmur, click, rub or gallop Extremities: extremities normal, atraumatic, no cyanosis or edema Pulses: 2+ and symmetric Skin: Skin color, texture, turgor normal. No rashes or lesions Neurologic: Grossly normal  EKG not performed   ASSESSMENT AND PLAN:   1. Delayed Gastric Emptying: improving. GI following. She has tolerated resumption of meds ok so far.   2. CAD: medical management as outlined above. She denies any chest pain but notes occasional mild exertional dyspnea with moderate to heavy physical activity. Her BP is moderately elevated. We will restart Imdur, 30 mg daily, as well as Crestor. Continue to hold Cardizem for now. Recheck BP in 4 weeks in HTN clinic. Can further titrate cardiac meds +/- restart Cardizem, at that time if BP allows. She was advised to also monitor BP at home and to notify us if any high readings 0000000 systolic.   3. HTN: per above. See problem # 2. F/u in HTN clinic in 4 weeks.   4. HLD: restart Crestor 20 mg for CAD. Dr. [name] wanted her to f/u in lipid clinic (see her office note  from 1/10). We will arrange f/u in 4 weeks.     [name] PA-C 07/08/2016 3:59 PM

## 2016-07-08 NOTE — Patient Instructions (Addendum)
Medication Instructions:   START BACK TAKING CRESTOR 20 MG ONCE A DAY   START TAKING IMDUR 30 MG ONCE A  DAY   If you need a refill on your cardiac medications before your next appointment, please call your pharmacy.  Labwork:  NONE ORDERED  TODAY    Testing/Procedures: NONE ORDERED  TODAY    Follow-Up: IN 4 WEEKS WITH  PHARM D FOR LIPID AND BLOOD PRESSURE CHECK   Any Other Special Instructions Will Be Listed Below (If Applicable).

## 2016-07-30 DIAGNOSIS — M5412 Radiculopathy, cervical region: Secondary | ICD-10-CM | POA: Diagnosis not present

## 2016-07-30 DIAGNOSIS — Z79899 Other long term (current) drug therapy: Secondary | ICD-10-CM | POA: Diagnosis not present

## 2016-07-30 DIAGNOSIS — Z79891 Long term (current) use of opiate analgesic: Secondary | ICD-10-CM | POA: Diagnosis not present

## 2016-07-30 DIAGNOSIS — M961 Postlaminectomy syndrome, not elsewhere classified: Secondary | ICD-10-CM | POA: Diagnosis not present

## 2016-07-30 DIAGNOSIS — M5030 Other cervical disc degeneration, unspecified cervical region: Secondary | ICD-10-CM | POA: Diagnosis not present

## 2016-07-30 DIAGNOSIS — G894 Chronic pain syndrome: Secondary | ICD-10-CM | POA: Diagnosis not present

## 2016-08-05 ENCOUNTER — Ambulatory Visit (INDEPENDENT_AMBULATORY_CARE_PROVIDER_SITE_OTHER): Payer: Medicare Other | Admitting: Pharmacist

## 2016-08-05 ENCOUNTER — Encounter: Payer: Self-pay | Admitting: Pharmacist

## 2016-08-05 VITALS — BP 132/84 | HR 108

## 2016-08-05 DIAGNOSIS — E782 Mixed hyperlipidemia: Secondary | ICD-10-CM

## 2016-08-05 DIAGNOSIS — I2510 Atherosclerotic heart disease of native coronary artery without angina pectoris: Secondary | ICD-10-CM

## 2016-08-05 MED ORDER — METOPROLOL TARTRATE 50 MG PO TABS: 50.0000 mg | ORAL_TABLET | Freq: Two times a day (BID) | ORAL | 3 refills | Status: DC

## 2016-08-05 NOTE — Progress Notes (Signed)
Patient ID: Angela [name]                 DOB: [birth year]                      MRN: [id number]     HPI: Angela [name] is a 71 y.o. female patient of Dr. [name] with [facility] below who presents today for hypertension evaluation/lipid follow up. PMH is significant for HTN, HLD, CAD, angina, pancreatitis, obesity, IBS, and family history of early CAD. She had exertional chest pain and underwent stress testing that was abnormal for possible ischemia. Cardiac cath revealed mild to moderate CAD with 30-40% proximal mid LAD stenosis and 70-75% ostial first diagonal stenosis, LVEF 55-65%. She is previously intolerant to multiple statins but is currently tolerating Crestor 20mg  daily.  She was hospitalized for severe nausea/vomiting and gastric motility issues, and all of her antihypertensives were held at that time. Losartan was restarted at discharge. At hospital follow up appointment in January 2018, Dr. [name] restarted metoprolol, but continued to hold Imdur and Cardizem, as her BP was controlled at that time. Her Crestor was also restarted at this time.   In February 2018, she saw [name], [place], and Imdur was restarted.  She reports that she is slightly stressed today as she overslept and went to the wrong building for her appt.   Today she reports that she does occasionally have palpitations and feels her heart racing. She also states she rarely has chest pains. She is unsure if this has improved since restarting Imdur, but will monitor over the next few weeks.   Current lipid medications: Crestor 20mg  daily   Intolerances: Crestor 40mg  daily (myalgias)  LDL goal: <70  Current HTN meds:  Metoprolol tartrate 25mg  BID - she is unsure if she is taking this BID or once daily - she called back and reports that she has been taking BID.  Losartan 25mg  daily  Imdur 30mg  daily   Previously tried:  Diltiazem (was held due to issues with gastric motility during a hospital admission in December  2017)  BP goal: <130/80  Diet: Minimal meat consumption, does snack frequently throughout the day  Exercise: Tries to walk with her husband. Would like to join [employer], but [employer] won't cover it  Family History: DM in her sister, MI in her father, heart disease in her mother, HTN in her mother and sister, stroke in her father.   Social History: Patient denies tobacco, alcohol, and illicit drug use.  Labs: 02/20/16: TC 150, TG 112, HDL 50, LDL 78 (Crestor 10mg  daily)  Home BP readings: Patient reports that pressure have been running a little high at 130s/80s.   Wt Readings from Last 3 Encounters:  07/08/16 168 lb 1.9 oz (76.3 kg)  06/11/16 172 lb (78 kg)  05/18/16 172 lb 9.9 oz (78.3 kg)   BP Readings from Last 3 Encounters:  08/05/16 132/84  07/08/16 (!) 148/88  06/11/16 124/76   Pulse Readings from Last 3 Encounters:  08/05/16 (!) 108  07/08/16 89  06/11/16 86    Renal function: CrCl cannot be calculated (Patient's most recent lab result is older than the maximum 21 days allowed.).  Past Medical History:  Diagnosis Date  . Anxiety   . Cancer of sigmoid ([place])   . Depression (emotion)   . DJD (degenerative joint disease)   . Fibromyalgia   . GERD (gastroesophageal reflux disease)   . Hemorrhoids   . Hyperlipidemia   .  Hypertension   . IBS (irritable bowel syndrome)   . MRSA (methicillin resistant Staphylococcus aureus) septicemia ([place])    history of  . Over weight   . Pancreatitis   . RLS (restless legs syndrome)   . Tachycardia     Current Outpatient Prescriptions on File Prior to Visit  Medication Sig Dispense Refill  . aspirin EC 81 MG tablet Take 81 mg by mouth daily.    . Cyanocobalamin (B-12) 2500 MCG TABS Take 1 tablet by mouth daily.    . DULoxetine (CYMBALTA) 20 MG capsule Take 20 mg by mouth daily.    Marland Kitchen escitalopram (LEXAPRO) 5 MG tablet Take 1 tablet by mouth daily.     . fluticasone (FLONASE) 50 MCG/ACT nasal spray Place 2 sprays  into both nostrils daily as needed for allergies or rhinitis.     Marland Kitchen isosorbide mononitrate (IMDUR) 30 MG 24 hr tablet Take 1 tablet (30 mg total) by mouth daily. 90 tablet 1  . LINZESS 72 MCG capsule Take 72 mcg by mouth daily as needed.    Marland Kitchen LORazepam (ATIVAN) 2 MG tablet Take 2 mg by mouth at bedtime.     Marland Kitchen losartan (COZAAR) 25 MG tablet Take 1 tablet (25 mg total) by mouth daily. 90 tablet 3  . metoprolol tartrate (LOPRESSOR) 25 MG tablet Take 1 tablet (25 mg total) by mouth 2 (two) times daily. (Patient taking differently: Take 25 mg by mouth daily. ) 180 tablet 3  . Morphine-Naltrexone (EMBEDA) 50-2 MG CPCR Take 1 tablet by mouth 2 (two) times daily.    Marland Kitchen omega-3 acid ethyl esters (LOVAZA) 1 g capsule Take 1 g by mouth 2 (two) times daily.    . ondansetron (ZOFRAN-ODT) 4 MG disintegrating tablet Take 1 tablet (4 mg total) by mouth every 6 (six) hours as needed for nausea or vomiting. 30 tablet 0  . pantoprazole (PROTONIX) 40 MG tablet Take 1 tablet by mouth daily.    Marland Kitchen rOPINIRole (REQUIP) 1 MG tablet Take 1-2 mg by mouth See admin instructions. ONE TABLET  IN AM TWO TABLETS IN PM     . rosuvastatin (CRESTOR) 20 MG tablet Take 1 tablet (20 mg total) by mouth daily. 90 tablet 1  . SAVELLA TITRATION PACK 12.5 & 25 & 50 MG MISC 50 mg 2 (two) times daily.     Marland Kitchen tiZANidine (ZANAFLEX) 4 MG capsule Take 4 mg by mouth every 8 (eight) hours as needed for muscle spasms.    . nitroGLYCERIN (NITROSTAT) 0.4 MG SL tablet Place 1 tablet (0.4 mg total) under the tongue every 5 (five) minutes as needed for chest pain. 25 tablet 3   No current facility-administered medications on file prior to visit.     Allergies  Allergen Reactions  . Norvasc [Amlodipine Besylate] Swelling  . Phenergan [Promethazine] Anaphylaxis  . Timentin [Ticarcillin-Pot Clavulanate] Anaphylaxis    Has patient had a PCN reaction causing immediate rash, facial/tongue/throat swelling, SOB or lightheadedness with hypotension: Yes Has  patient had a PCN reaction causing severe rash involving mucus membranes or skin necrosis: Yes Has patient had a PCN reaction that required hospitalization Yes Has patient had a PCN reaction occurring within the last 10 years: No If all of the above answers are "NO", then may proceed with Cephalosporin use.     Blood pressure 132/84, pulse (!) 108, SpO2 98 %.   Assessment/Plan: Hypertension: BP slightly above goal. HR increased at 108. Will increase metoprolol tartrate to 50mg  BID to help with HR  and BP. Continue to monitor pressures. Follow up in HTN clinic in 3-4 weeks.   Hyperlipidemia: Plan to recheck lipid/hepatic panel in 2-3 months after resume Crestor. We will order labs and make follow up appt at BP f/u visit.   Thank you, Lelan Pons. Patterson Hammersmith, [facility]  08/05/2016 4:16 PM

## 2016-08-05 NOTE — Patient Instructions (Signed)
Return for a follow up appointment in 3-4 weeks  Check your blood pressure at home daily (if able) and keep record of the readings.  Take your BP meds as follows: PLEASE call [phone] with how you are taking your metoprolol   WE will plan to change dose of metoprolol to help with heart rate and blood pressure.   Bring all of your meds, your BP cuff and your record of home blood pressures to your next appointment.  Exercise as you're able, try to walk approximately 30 minutes per day.  Keep salt intake to a minimum, especially watch canned and prepared boxed foods.  Eat more fresh fruits and vegetables and fewer canned items.  Avoid eating in fast food restaurants.    HOW TO TAKE YOUR BLOOD PRESSURE: . Rest 5 minutes before taking your blood pressure. .  Don't smoke or drink caffeinated beverages for at least 30 minutes before. . Take your blood pressure before (not after) you eat. . Sit comfortably with your back supported and both feet on the floor (don't cross your legs). . Elevate your arm to heart level on a table or a desk. . Use the proper sized cuff. It should fit smoothly and snugly around your bare upper arm. There should be enough room to slip a fingertip under the cuff. The bottom edge of the cuff should be 1 inch above the crease of the elbow. . Ideally, take 3 measurements at one sitting and record the average.

## 2016-08-11 DIAGNOSIS — G609 Hereditary and idiopathic neuropathy, unspecified: Secondary | ICD-10-CM | POA: Diagnosis not present

## 2016-08-11 DIAGNOSIS — F4542 Pain disorder with related psychological factors: Secondary | ICD-10-CM | POA: Diagnosis not present

## 2016-08-11 DIAGNOSIS — G894 Chronic pain syndrome: Secondary | ICD-10-CM | POA: Diagnosis not present

## 2016-08-11 DIAGNOSIS — M792 Neuralgia and neuritis, unspecified: Secondary | ICD-10-CM | POA: Diagnosis not present

## 2016-08-25 DIAGNOSIS — M5412 Radiculopathy, cervical region: Secondary | ICD-10-CM | POA: Diagnosis not present

## 2016-08-25 DIAGNOSIS — M961 Postlaminectomy syndrome, not elsewhere classified: Secondary | ICD-10-CM | POA: Diagnosis not present

## 2016-08-25 DIAGNOSIS — M5030 Other cervical disc degeneration, unspecified cervical region: Secondary | ICD-10-CM | POA: Diagnosis not present

## 2016-08-25 DIAGNOSIS — M542 Cervicalgia: Secondary | ICD-10-CM | POA: Diagnosis not present

## 2016-09-02 ENCOUNTER — Ambulatory Visit: Payer: Medicare Other

## 2016-09-08 DIAGNOSIS — R634 Abnormal weight loss: Secondary | ICD-10-CM | POA: Diagnosis not present

## 2016-09-08 DIAGNOSIS — G43A1 Cyclical vomiting, intractable: Secondary | ICD-10-CM | POA: Diagnosis not present

## 2016-09-08 DIAGNOSIS — K5903 Drug induced constipation: Secondary | ICD-10-CM | POA: Diagnosis not present

## 2016-09-08 DIAGNOSIS — R1010 Upper abdominal pain, unspecified: Secondary | ICD-10-CM | POA: Diagnosis not present

## 2016-09-23 DIAGNOSIS — Z79899 Other long term (current) drug therapy: Secondary | ICD-10-CM | POA: Diagnosis not present

## 2016-09-23 DIAGNOSIS — G894 Chronic pain syndrome: Secondary | ICD-10-CM | POA: Diagnosis not present

## 2016-09-23 DIAGNOSIS — M542 Cervicalgia: Secondary | ICD-10-CM | POA: Diagnosis not present

## 2016-09-23 DIAGNOSIS — M5412 Radiculopathy, cervical region: Secondary | ICD-10-CM | POA: Diagnosis not present

## 2016-09-23 DIAGNOSIS — Z79891 Long term (current) use of opiate analgesic: Secondary | ICD-10-CM | POA: Diagnosis not present

## 2016-09-23 DIAGNOSIS — M5030 Other cervical disc degeneration, unspecified cervical region: Secondary | ICD-10-CM | POA: Diagnosis not present

## 2016-09-24 DIAGNOSIS — Z79899 Other long term (current) drug therapy: Secondary | ICD-10-CM | POA: Diagnosis not present

## 2016-09-24 DIAGNOSIS — Z79891 Long term (current) use of opiate analgesic: Secondary | ICD-10-CM | POA: Diagnosis not present

## 2016-09-24 DIAGNOSIS — G894 Chronic pain syndrome: Secondary | ICD-10-CM | POA: Diagnosis not present

## 2016-09-29 ENCOUNTER — Ambulatory Visit: Payer: Medicare Other

## 2016-09-29 NOTE — Progress Notes (Deleted)
Patient ID: Angela [name]                 DOB: [birth year]                      MRN: [id number]     HPI: Angela [name] is a 71 y.o. female patient of Dr. [name] with [facility] below who presents today for hypertension evaluation/lipid follow up. PMH is significant for HTN, HLD, CAD, angina, pancreatitis, obesity, IBS, and family history of early CAD. She had exertional chest pain and underwent stress testing that was abnormal for possible ischemia. Cardiac cath revealed mild to moderate CAD with 30-40% proximal mid LAD stenosis and 70-75% ostial first diagonal stenosis, LVEF 55-65%. She is previously intolerant to multiple statins but is currently tolerating Crestor 20mg  daily.  She was hospitalized for severe nausea/vomiting and gastric motility issues, and all of her antihypertensives were held at that time. Losartan was restarted at discharge. At hospital follow up appointment in January 2018, Dr. [name] restarted metoprolol, but continued to hold Imdur and Cardizem, as her BP was controlled at that time. Her Crestor was also restarted at this time. In February 2018, she saw [name], [place], and Imdur was restarted.  At her most recent visit her metoprolol was increased to 50mg  BID.   Current lipid medications: Crestor 20mg  daily   Intolerances: Crestor 40mg  daily (myalgias)  LDL goal: <70  Current HTN meds:  Metoprolol tartrate 50mg  BID  Losartan 25mg  daily  Imdur 30mg  daily   Previously tried:  Diltiazem (was held due to issues with gastric motility during a hospital admission in December 2017)  BP goal: <130/80  Diet: Minimal meat consumption, does snack frequently throughout the day  Exercise: Tries to walk with her husband. Would like to join [employer], but [employer] won't cover it  Family History: DM in her sister, MI in her father, heart disease in her mother, HTN in her mother and sister, stroke in her father.   Social History: Patient denies tobacco, alcohol, and  illicit drug use.  Labs: 02/20/16: TC 150, TG 112, HDL 50, LDL 78 (Crestor 10mg  daily)  Home BP readings:    Wt Readings from Last 3 Encounters:  07/08/16 168 lb 1.9 oz (76.3 kg)  06/11/16 172 lb (78 kg)  05/18/16 172 lb 9.9 oz (78.3 kg)   BP Readings from Last 3 Encounters:  08/05/16 132/84  07/08/16 (!) 148/88  06/11/16 124/76   Pulse Readings from Last 3 Encounters:  08/05/16 (!) 108  07/08/16 89  06/11/16 86    Renal function: CrCl cannot be calculated (Patient's most recent lab result is older than the maximum 21 days allowed.).  Past Medical History:  Diagnosis Date  . Anxiety   . Cancer of sigmoid ([place])   . Depression (emotion)   . DJD (degenerative joint disease)   . Fibromyalgia   . GERD (gastroesophageal reflux disease)   . Hemorrhoids   . Hyperlipidemia   . Hypertension   . IBS (irritable bowel syndrome)   . MRSA (methicillin resistant Staphylococcus aureus) septicemia ([place])    history of  . Over weight   . Pancreatitis   . RLS (restless legs syndrome)   . Tachycardia     Current Outpatient Prescriptions on File Prior to Visit  Medication Sig Dispense Refill  . aspirin EC 81 MG tablet Take 81 mg by mouth daily.    . Cyanocobalamin (B-12) 2500 MCG TABS Take 1 tablet by  mouth daily.    . DULoxetine (CYMBALTA) 20 MG capsule Take 20 mg by mouth daily.    Marland Kitchen escitalopram (LEXAPRO) 5 MG tablet Take 1 tablet by mouth daily.     . fluticasone (FLONASE) 50 MCG/ACT nasal spray Place 2 sprays into both nostrils daily as needed for allergies or rhinitis.     Marland Kitchen isosorbide mononitrate (IMDUR) 30 MG 24 hr tablet Take 1 tablet (30 mg total) by mouth daily. 90 tablet 1  . LINZESS 72 MCG capsule Take 72 mcg by mouth daily as needed.    Marland Kitchen LORazepam (ATIVAN) 2 MG tablet Take 2 mg by mouth at bedtime.     Marland Kitchen losartan (COZAAR) 25 MG tablet Take 1 tablet (25 mg total) by mouth daily. 90 tablet 3  . metoprolol tartrate (LOPRESSOR) 50 MG tablet Take 1 tablet (50 mg total) by  mouth 2 (two) times daily. 180 tablet 3  . Morphine-Naltrexone (EMBEDA) 50-2 MG CPCR Take 1 tablet by mouth 2 (two) times daily.    . naloxegol oxalate (MOVANTIK) 12.5 MG TABS tablet Take 12.5 mg by mouth daily.    . nitroGLYCERIN (NITROSTAT) 0.4 MG SL tablet Place 1 tablet (0.4 mg total) under the tongue every 5 (five) minutes as needed for chest pain. 25 tablet 3  . omega-3 acid ethyl esters (LOVAZA) 1 g capsule Take 1 g by mouth 2 (two) times daily.    . ondansetron (ZOFRAN-ODT) 4 MG disintegrating tablet Take 1 tablet (4 mg total) by mouth every 6 (six) hours as needed for nausea or vomiting. 30 tablet 0  . pantoprazole (PROTONIX) 40 MG tablet Take 1 tablet by mouth daily.    Marland Kitchen rOPINIRole (REQUIP) 1 MG tablet Take 1-2 mg by mouth See admin instructions. ONE TABLET  IN AM TWO TABLETS IN PM     . rosuvastatin (CRESTOR) 20 MG tablet Take 1 tablet (20 mg total) by mouth daily. 90 tablet 1  . SAVELLA TITRATION PACK 12.5 & 25 & 50 MG MISC 50 mg 2 (two) times daily.     Marland Kitchen tiZANidine (ZANAFLEX) 4 MG capsule Take 4 mg by mouth every 8 (eight) hours as needed for muscle spasms.     No current facility-administered medications on file prior to visit.     Allergies  Allergen Reactions  . Norvasc [Amlodipine Besylate] Swelling  . Phenergan [Promethazine] Anaphylaxis  . Timentin [Ticarcillin-Pot Clavulanate] Anaphylaxis    Has patient had a PCN reaction causing immediate rash, facial/tongue/throat swelling, SOB or lightheadedness with hypotension: Yes Has patient had a PCN reaction causing severe rash involving mucus membranes or skin necrosis: Yes Has patient had a PCN reaction that required hospitalization Yes Has patient had a PCN reaction occurring within the last 10 years: No If all of the above answers are "NO", then may proceed with Cephalosporin use.     There were no vitals taken for this visit.   Assessment/Plan: Hypertension:   Hyperlipidemia: Plan to recheck lipid/hepatic panel in  2-3 months after resume Crestor. Labs ordered today.   Thank you, Lelan Pons. Patterson Hammersmith, [facility]  09/29/2016 7:43 AM

## 2016-09-30 ENCOUNTER — Other Ambulatory Visit: Payer: Self-pay | Admitting: Pain Medicine

## 2016-09-30 DIAGNOSIS — M542 Cervicalgia: Secondary | ICD-10-CM

## 2016-09-30 DIAGNOSIS — M79603 Pain in arm, unspecified: Secondary | ICD-10-CM

## 2016-10-02 DIAGNOSIS — R1013 Epigastric pain: Secondary | ICD-10-CM | POA: Diagnosis not present

## 2016-10-02 DIAGNOSIS — K3189 Other diseases of stomach and duodenum: Secondary | ICD-10-CM | POA: Diagnosis not present

## 2016-10-02 DIAGNOSIS — K2930 Chronic superficial gastritis without bleeding: Secondary | ICD-10-CM | POA: Diagnosis not present

## 2016-10-02 DIAGNOSIS — R112 Nausea with vomiting, unspecified: Secondary | ICD-10-CM | POA: Diagnosis not present

## 2016-10-07 DIAGNOSIS — K2930 Chronic superficial gastritis without bleeding: Secondary | ICD-10-CM | POA: Diagnosis not present

## 2016-10-08 DIAGNOSIS — K3184 Gastroparesis: Secondary | ICD-10-CM | POA: Diagnosis not present

## 2016-10-08 DIAGNOSIS — K5900 Constipation, unspecified: Secondary | ICD-10-CM | POA: Diagnosis not present

## 2016-10-08 DIAGNOSIS — R110 Nausea: Secondary | ICD-10-CM | POA: Diagnosis not present

## 2016-10-08 DIAGNOSIS — R634 Abnormal weight loss: Secondary | ICD-10-CM | POA: Diagnosis not present

## 2016-10-11 ENCOUNTER — Ambulatory Visit
Admission: RE | Admit: 2016-10-11 | Discharge: 2016-10-11 | Disposition: A | Discharge status: Home / Self Care | Payer: Medicare Other | Source: Ambulatory Visit | Attending: Pain Medicine | Admitting: Pain Medicine

## 2016-10-11 DIAGNOSIS — M4802 Spinal stenosis, cervical region: Secondary | ICD-10-CM | POA: Diagnosis not present

## 2016-10-11 DIAGNOSIS — M79603 Pain in arm, unspecified: Secondary | ICD-10-CM

## 2016-10-11 DIAGNOSIS — M542 Cervicalgia: Secondary | ICD-10-CM

## 2016-10-15 ENCOUNTER — Telehealth: Payer: Self-pay | Admitting: Physician Assistant

## 2016-10-15 ENCOUNTER — Telehealth: Payer: Self-pay | Admitting: Cardiology

## 2016-10-15 DIAGNOSIS — M4722 Other spondylosis with radiculopathy, cervical region: Secondary | ICD-10-CM | POA: Diagnosis not present

## 2016-10-15 NOTE — Telephone Encounter (Signed)
New message    Request for surgical clearance:  1. What type of surgery is being performed? Neck and back surgery   2. When is this surgery scheduled? As soon as released  3. Are there any medications that need to be held prior to surgery and how long? aspirin   4. Name of physician performing surgery? Dr Carloyn Manner  5. What is your office phone and fax number?  Fax [phone] [phone]  Put attention lisa

## 2016-10-15 NOTE — Telephone Encounter (Signed)
Will route surgical clearance request to Dr [name], to review and advise on.  Will follow-up with Dr. Rex Kras office accordingly, once clearance provided.

## 2016-10-16 ENCOUNTER — Encounter: Payer: Self-pay | Admitting: *Deleted

## 2016-10-16 ENCOUNTER — Encounter: Payer: Self-pay | Admitting: Pharmacist

## 2016-10-16 ENCOUNTER — Ambulatory Visit (INDEPENDENT_AMBULATORY_CARE_PROVIDER_SITE_OTHER): Payer: Medicare Other | Admitting: Pharmacist

## 2016-10-16 VITALS — BP 102/62 | HR 69

## 2016-10-16 DIAGNOSIS — I2510 Atherosclerotic heart disease of native coronary artery without angina pectoris: Secondary | ICD-10-CM | POA: Diagnosis not present

## 2016-10-16 DIAGNOSIS — E782 Mixed hyperlipidemia: Secondary | ICD-10-CM | POA: Diagnosis not present

## 2016-10-16 NOTE — Telephone Encounter (Signed)
Clearance letter written and will fax this information to Dr Roy's office, at contact information provided.  Dr. Rex Kras office to follow-up with the pt accordingly.

## 2016-10-16 NOTE — Telephone Encounter (Signed)
To Whom It May Concern,  Mrs Angela [name] is currently under my care and is being considered an intermediate risk for moderate risk surgery. She has stable angina no signs of heart failure and no known critical valvular disease. There is currently no contraindication for this patient to undergo scheduled back surgery.  Please call us with any questions.  Sincerely,  Ena Dawley, MD [place]

## 2016-10-16 NOTE — Progress Notes (Signed)
Patient ID: Angela [name]                 DOB: [birth year]                      MRN: [id number]     HPI: Angela [name] is a 71 y.o. female patient of Dr. Meda [name] with [facility] below who presents today for hypertension evaluation/lipid follow up. PMH is significant for HTN, HLD, CAD, angina, pancreatitis, obesity, IBS, and family history of early CAD. She had exertional chest pain and underwent stress testing that was abnormal for possible ischemia. Cardiac cath revealed mild to moderate CAD with 30-40% proximal mid LAD stenosis and 70-75% ostial first diagonal stenosis, LVEF 55-65%. She is previously intolerant to multiple statins but is currently tolerating Crestor 20mg  daily.  She was hospitalized for severe nausea/vomiting and gastric motility issues, and all of her antihypertensives were held at that time. Losartan was restarted at discharge. At hospital follow up appointment in January 2018, Dr. Meda [name] restarted metoprolol, but continued to hold Imdur and Cardizem, as her BP was controlled at that time. Her Crestor was also restarted at this time. In February 2018, she saw Angela [name], [place], and Imdur was restarted.  At her most recent visit her metoprolol was increased to 50mg  BID.   She presents today with her husband. She is wearing a neck brace and states she is scheduled to have spinal fusion in June. She was also recently diagnosed with gastroparesis. She reports she has been struggling with severe pain due to her back and stomach issues. She rated her pain about a 8 out of 10, but states she just took a pain pill and usually starts feeling better within the hour.   She did have episodes of dizziness about a month ago. Has had several headaches when pressure is higher. Has had an episode of "clunk" with heaviness in chest that returns to normal quickly after starting. She described this "clunk" after meals so may be heart burn with the rapid onset and short duration.   Current lipid  medications: Crestor 20mg  daily   Intolerances: Crestor 40mg  daily (myalgias)  LDL goal: <70  Current HTN meds:  Metoprolol tartrate 50mg  BID  Losartan 25mg  daily  Imdur 30mg  daily   Previously tried:  Diltiazem (was held due to issues with gastric motility during a hospital admission in December 2017)  BP goal: <130/80  Diet: Minimal meat consumption, does snack frequently throughout the day  Exercise: Tries to walk with her husband. Would like to join [employer], but [employer] won't cover it  Family History: DM in her sister, MI in her father, heart disease in her mother, HTN in her mother and sister, stroke in her father.   Social History: Patient denies tobacco, alcohol, and illicit drug use.  Labs: 02/20/16: TC 150, TG 112, HDL 50, LDL 78 (Crestor 10mg  daily)  Home BP readings:  190/92 - at MD yesterday. She states pressure is up and down depending on pain level.  Wt Readings from Last 3 Encounters:  07/08/16 168 lb 1.9 oz (76.3 kg)  06/11/16 172 lb (78 kg)  05/18/16 172 lb 9.9 oz (78.3 kg)   BP Readings from Last 3 Encounters:  10/16/16 102/62  08/05/16 132/84  07/08/16 (!) 148/88   Pulse Readings from Last 3 Encounters:  10/16/16 69  08/05/16 (!) 108  07/08/16 89    Renal function: CrCl cannot be calculated (Patient's most recent lab result  is older than the maximum 21 days allowed.).  Past Medical History:  Diagnosis Date  . Anxiety   . Cancer of sigmoid ([place])   . Depression (emotion)   . DJD (degenerative joint disease)   . Fibromyalgia   . GERD (gastroesophageal reflux disease)   . Hemorrhoids   . Hyperlipidemia   . Hypertension   . IBS (irritable bowel syndrome)   . MRSA (methicillin resistant Staphylococcus aureus) septicemia ([place])    history of  . Over weight   . Pancreatitis   . RLS (restless legs syndrome)   . Tachycardia     Current Outpatient Prescriptions on File Prior to Visit  Medication Sig Dispense Refill  . aspirin EC  81 MG tablet Take 81 mg by mouth daily.    . Cyanocobalamin (B-12) 2500 MCG TABS Take 1 tablet by mouth daily.    . fluticasone (FLONASE) 50 MCG/ACT nasal spray Place 2 sprays into both nostrils daily as needed for allergies or rhinitis.     Marland Kitchen isosorbide mononitrate (IMDUR) 30 MG 24 hr tablet Take 1 tablet (30 mg total) by mouth daily. (Patient taking differently: Take 60 mg by mouth daily. ) 90 tablet 1  . LORazepam (ATIVAN) 2 MG tablet Take 2 mg by mouth at bedtime.     Marland Kitchen losartan (COZAAR) 25 MG tablet Take 1 tablet (25 mg total) by mouth daily. 90 tablet 3  . metoprolol tartrate (LOPRESSOR) 50 MG tablet Take 1 tablet (50 mg total) by mouth 2 (two) times daily. 180 tablet 3  . Morphine-Naltrexone (EMBEDA) 50-2 MG CPCR Take 1 tablet by mouth 2 (two) times daily.    . naloxegol oxalate (MOVANTIK) 12.5 MG TABS tablet Take 12.5 mg by mouth daily.    Marland Kitchen omega-3 acid ethyl esters (LOVAZA) 1 g capsule Take 1 g by mouth 2 (two) times daily.    . ondansetron (ZOFRAN-ODT) 4 MG disintegrating tablet Take 1 tablet (4 mg total) by mouth every 6 (six) hours as needed for nausea or vomiting. 30 tablet 0  . pantoprazole (PROTONIX) 40 MG tablet Take 1 tablet by mouth daily.    Marland Kitchen rOPINIRole (REQUIP) 1 MG tablet Take 1-2 mg by mouth See admin instructions. ONE TABLET  IN AM TWO TABLETS IN PM     . rosuvastatin (CRESTOR) 20 MG tablet Take 1 tablet (20 mg total) by mouth daily. 90 tablet 1  . SAVELLA TITRATION PACK 12.5 & 25 & 50 MG MISC 100 mg daily.     Marland Kitchen tiZANidine (ZANAFLEX) 4 MG capsule Take 4 mg by mouth every 8 (eight) hours as needed for muscle spasms.    Marland Kitchen escitalopram (LEXAPRO) 5 MG tablet Take 1 tablet by mouth daily.     . nitroGLYCERIN (NITROSTAT) 0.4 MG SL tablet Place 1 tablet (0.4 mg total) under the tongue every 5 (five) minutes as needed for chest pain. 25 tablet 3   No current facility-administered medications on file prior to visit.     Allergies  Allergen Reactions  . Norvasc [Amlodipine  Besylate] Swelling  . Phenergan [Promethazine] Anaphylaxis  . Timentin [Ticarcillin-Pot Clavulanate] Anaphylaxis    Has patient had a PCN reaction causing immediate rash, facial/tongue/throat swelling, SOB or lightheadedness with hypotension: Yes Has patient had a PCN reaction causing severe rash involving mucus membranes or skin necrosis: Yes Has patient had a PCN reaction that required hospitalization Yes Has patient had a PCN reaction occurring within the last 10 years: No If all of the above answers are "NO",  then may proceed with Cephalosporin use.     Blood pressure 102/62, pulse 69.   Assessment/Plan: Hypertension: BP today low to normal. Will continue current regimen. Advised if dizzy spells recur to call clinic.    Hyperlipidemia: Will request labs from PCP office.   Thank you, Lelan Pons. Patterson Hammersmith, [facility]  10/16/2016 2:19 PM

## 2016-10-16 NOTE — Patient Instructions (Signed)
Thank you for coming in today.   Continue all medications as prescribed  Follow up with Dr. Radford Pax as recommended and blood pressure clinic as needed

## 2016-10-22 DIAGNOSIS — Z79899 Other long term (current) drug therapy: Secondary | ICD-10-CM | POA: Diagnosis not present

## 2016-10-22 DIAGNOSIS — Z79891 Long term (current) use of opiate analgesic: Secondary | ICD-10-CM | POA: Diagnosis not present

## 2016-10-22 DIAGNOSIS — M5030 Other cervical disc degeneration, unspecified cervical region: Secondary | ICD-10-CM | POA: Diagnosis not present

## 2016-10-22 DIAGNOSIS — G894 Chronic pain syndrome: Secondary | ICD-10-CM | POA: Diagnosis not present

## 2016-10-22 DIAGNOSIS — M542 Cervicalgia: Secondary | ICD-10-CM | POA: Diagnosis not present

## 2016-10-22 DIAGNOSIS — M5412 Radiculopathy, cervical region: Secondary | ICD-10-CM | POA: Diagnosis not present

## 2016-10-30 DIAGNOSIS — M797 Fibromyalgia: Secondary | ICD-10-CM | POA: Diagnosis not present

## 2016-10-30 DIAGNOSIS — Z01810 Encounter for preprocedural cardiovascular examination: Secondary | ICD-10-CM | POA: Diagnosis not present

## 2016-10-30 DIAGNOSIS — I10 Essential (primary) hypertension: Secondary | ICD-10-CM | POA: Diagnosis not present

## 2016-10-30 DIAGNOSIS — Z01812 Encounter for preprocedural laboratory examination: Secondary | ICD-10-CM | POA: Diagnosis not present

## 2016-10-30 DIAGNOSIS — M542 Cervicalgia: Secondary | ICD-10-CM | POA: Diagnosis not present

## 2016-10-30 DIAGNOSIS — E785 Hyperlipidemia, unspecified: Secondary | ICD-10-CM | POA: Diagnosis not present

## 2016-10-30 DIAGNOSIS — M1990 Unspecified osteoarthritis, unspecified site: Secondary | ICD-10-CM | POA: Diagnosis not present

## 2016-11-06 DIAGNOSIS — Z881 Allergy status to other antibiotic agents status: Secondary | ICD-10-CM | POA: Diagnosis not present

## 2016-11-06 DIAGNOSIS — M4722 Other spondylosis with radiculopathy, cervical region: Secondary | ICD-10-CM | POA: Diagnosis not present

## 2016-11-06 DIAGNOSIS — D6851 Activated protein C resistance: Secondary | ICD-10-CM | POA: Diagnosis not present

## 2016-11-06 DIAGNOSIS — K219 Gastro-esophageal reflux disease without esophagitis: Secondary | ICD-10-CM | POA: Diagnosis not present

## 2016-11-06 DIAGNOSIS — Z85038 Personal history of other malignant neoplasm of large intestine: Secondary | ICD-10-CM | POA: Diagnosis not present

## 2016-11-06 DIAGNOSIS — Z7982 Long term (current) use of aspirin: Secondary | ICD-10-CM | POA: Diagnosis not present

## 2016-11-06 DIAGNOSIS — Z79899 Other long term (current) drug therapy: Secondary | ICD-10-CM | POA: Diagnosis not present

## 2016-11-06 DIAGNOSIS — Z888 Allergy status to other drugs, medicaments and biological substances status: Secondary | ICD-10-CM | POA: Diagnosis not present

## 2016-11-06 DIAGNOSIS — Z886 Allergy status to analgesic agent status: Secondary | ICD-10-CM | POA: Diagnosis not present

## 2016-11-06 DIAGNOSIS — M797 Fibromyalgia: Secondary | ICD-10-CM | POA: Diagnosis not present

## 2016-11-06 DIAGNOSIS — I10 Essential (primary) hypertension: Secondary | ICD-10-CM | POA: Diagnosis not present

## 2016-11-06 DIAGNOSIS — E785 Hyperlipidemia, unspecified: Secondary | ICD-10-CM | POA: Diagnosis not present

## 2016-11-06 DIAGNOSIS — Z981 Arthrodesis status: Secondary | ICD-10-CM | POA: Diagnosis not present

## 2016-11-06 DIAGNOSIS — Z791 Long term (current) use of non-steroidal anti-inflammatories (NSAID): Secondary | ICD-10-CM | POA: Diagnosis not present

## 2016-11-06 DIAGNOSIS — I2510 Atherosclerotic heart disease of native coronary artery without angina pectoris: Secondary | ICD-10-CM | POA: Diagnosis not present

## 2016-11-06 DIAGNOSIS — G2581 Restless legs syndrome: Secondary | ICD-10-CM | POA: Diagnosis not present

## 2016-11-06 DIAGNOSIS — F329 Major depressive disorder, single episode, unspecified: Secondary | ICD-10-CM | POA: Diagnosis not present

## 2016-11-07 DIAGNOSIS — E785 Hyperlipidemia, unspecified: Secondary | ICD-10-CM | POA: Diagnosis not present

## 2016-11-07 DIAGNOSIS — M4722 Other spondylosis with radiculopathy, cervical region: Secondary | ICD-10-CM | POA: Diagnosis not present

## 2016-11-07 DIAGNOSIS — D6851 Activated protein C resistance: Secondary | ICD-10-CM | POA: Diagnosis not present

## 2016-11-07 DIAGNOSIS — G2581 Restless legs syndrome: Secondary | ICD-10-CM | POA: Diagnosis not present

## 2016-11-07 DIAGNOSIS — M797 Fibromyalgia: Secondary | ICD-10-CM | POA: Diagnosis not present

## 2016-11-07 DIAGNOSIS — K219 Gastro-esophageal reflux disease without esophagitis: Secondary | ICD-10-CM | POA: Diagnosis not present

## 2016-12-09 ENCOUNTER — Other Ambulatory Visit: Payer: Self-pay | Admitting: Neurosurgery

## 2016-12-09 ENCOUNTER — Ambulatory Visit
Admission: RE | Admit: 2016-12-09 | Discharge: 2016-12-09 | Disposition: A | Discharge status: Home / Self Care | Payer: Medicare Other | Source: Ambulatory Visit | Attending: Neurosurgery | Admitting: Neurosurgery

## 2016-12-09 DIAGNOSIS — M4722 Other spondylosis with radiculopathy, cervical region: Secondary | ICD-10-CM

## 2016-12-09 DIAGNOSIS — M47812 Spondylosis without myelopathy or radiculopathy, cervical region: Secondary | ICD-10-CM | POA: Diagnosis not present

## 2016-12-22 DIAGNOSIS — M5030 Other cervical disc degeneration, unspecified cervical region: Secondary | ICD-10-CM | POA: Diagnosis not present

## 2016-12-22 DIAGNOSIS — M5412 Radiculopathy, cervical region: Secondary | ICD-10-CM | POA: Diagnosis not present

## 2016-12-22 DIAGNOSIS — G894 Chronic pain syndrome: Secondary | ICD-10-CM | POA: Diagnosis not present

## 2016-12-22 DIAGNOSIS — Z79899 Other long term (current) drug therapy: Secondary | ICD-10-CM | POA: Diagnosis not present

## 2016-12-22 DIAGNOSIS — Z79891 Long term (current) use of opiate analgesic: Secondary | ICD-10-CM | POA: Diagnosis not present

## 2016-12-22 DIAGNOSIS — M542 Cervicalgia: Secondary | ICD-10-CM | POA: Diagnosis not present

## 2016-12-23 NOTE — Telephone Encounter (Signed)
Encounter to be closed per Rhodia Albright in scheduling.

## 2016-12-24 DIAGNOSIS — R110 Nausea: Secondary | ICD-10-CM | POA: Diagnosis not present

## 2016-12-24 DIAGNOSIS — Z85038 Personal history of other malignant neoplasm of large intestine: Secondary | ICD-10-CM | POA: Diagnosis not present

## 2016-12-24 DIAGNOSIS — K3184 Gastroparesis: Secondary | ICD-10-CM | POA: Diagnosis not present

## 2016-12-24 DIAGNOSIS — K5903 Drug induced constipation: Secondary | ICD-10-CM | POA: Diagnosis not present

## 2016-12-24 DIAGNOSIS — R634 Abnormal weight loss: Secondary | ICD-10-CM | POA: Diagnosis not present

## 2017-01-16 ENCOUNTER — Other Ambulatory Visit: Payer: Self-pay | Admitting: Physician Assistant

## 2017-01-19 DIAGNOSIS — M542 Cervicalgia: Secondary | ICD-10-CM | POA: Diagnosis not present

## 2017-01-19 DIAGNOSIS — Z79899 Other long term (current) drug therapy: Secondary | ICD-10-CM | POA: Diagnosis not present

## 2017-01-19 DIAGNOSIS — M5412 Radiculopathy, cervical region: Secondary | ICD-10-CM | POA: Diagnosis not present

## 2017-01-19 DIAGNOSIS — M5030 Other cervical disc degeneration, unspecified cervical region: Secondary | ICD-10-CM | POA: Diagnosis not present

## 2017-01-19 DIAGNOSIS — Z79891 Long term (current) use of opiate analgesic: Secondary | ICD-10-CM | POA: Diagnosis not present

## 2017-01-19 DIAGNOSIS — G894 Chronic pain syndrome: Secondary | ICD-10-CM | POA: Diagnosis not present

## 2017-02-12 DIAGNOSIS — M797 Fibromyalgia: Secondary | ICD-10-CM | POA: Diagnosis not present

## 2017-02-12 DIAGNOSIS — F329 Major depressive disorder, single episode, unspecified: Secondary | ICD-10-CM | POA: Diagnosis not present

## 2017-02-12 DIAGNOSIS — R202 Paresthesia of skin: Secondary | ICD-10-CM | POA: Diagnosis not present

## 2017-02-12 DIAGNOSIS — Z0000 Encounter for general adult medical examination without abnormal findings: Secondary | ICD-10-CM | POA: Diagnosis not present

## 2017-02-12 DIAGNOSIS — Z23 Encounter for immunization: Secondary | ICD-10-CM | POA: Diagnosis not present

## 2017-02-12 DIAGNOSIS — I10 Essential (primary) hypertension: Secondary | ICD-10-CM | POA: Diagnosis not present

## 2017-02-12 DIAGNOSIS — E785 Hyperlipidemia, unspecified: Secondary | ICD-10-CM | POA: Diagnosis not present

## 2017-02-16 DIAGNOSIS — M5412 Radiculopathy, cervical region: Secondary | ICD-10-CM | POA: Diagnosis not present

## 2017-02-16 DIAGNOSIS — M961 Postlaminectomy syndrome, not elsewhere classified: Secondary | ICD-10-CM | POA: Diagnosis not present

## 2017-02-16 DIAGNOSIS — G894 Chronic pain syndrome: Secondary | ICD-10-CM | POA: Diagnosis not present

## 2017-02-16 DIAGNOSIS — M5030 Other cervical disc degeneration, unspecified cervical region: Secondary | ICD-10-CM | POA: Diagnosis not present

## 2017-02-16 DIAGNOSIS — Z79899 Other long term (current) drug therapy: Secondary | ICD-10-CM | POA: Diagnosis not present

## 2017-02-16 DIAGNOSIS — Z79891 Long term (current) use of opiate analgesic: Secondary | ICD-10-CM | POA: Diagnosis not present

## 2017-03-16 DIAGNOSIS — Z79899 Other long term (current) drug therapy: Secondary | ICD-10-CM | POA: Diagnosis not present

## 2017-03-16 DIAGNOSIS — M5412 Radiculopathy, cervical region: Secondary | ICD-10-CM | POA: Diagnosis not present

## 2017-03-16 DIAGNOSIS — M5030 Other cervical disc degeneration, unspecified cervical region: Secondary | ICD-10-CM | POA: Diagnosis not present

## 2017-03-16 DIAGNOSIS — G894 Chronic pain syndrome: Secondary | ICD-10-CM | POA: Diagnosis not present

## 2017-03-16 DIAGNOSIS — M961 Postlaminectomy syndrome, not elsewhere classified: Secondary | ICD-10-CM | POA: Diagnosis not present

## 2017-03-16 DIAGNOSIS — Z79891 Long term (current) use of opiate analgesic: Secondary | ICD-10-CM | POA: Diagnosis not present

## 2017-04-16 DIAGNOSIS — Z79891 Long term (current) use of opiate analgesic: Secondary | ICD-10-CM | POA: Diagnosis not present

## 2017-04-16 DIAGNOSIS — M961 Postlaminectomy syndrome, not elsewhere classified: Secondary | ICD-10-CM | POA: Diagnosis not present

## 2017-04-16 DIAGNOSIS — G894 Chronic pain syndrome: Secondary | ICD-10-CM | POA: Diagnosis not present

## 2017-04-16 DIAGNOSIS — M5412 Radiculopathy, cervical region: Secondary | ICD-10-CM | POA: Diagnosis not present

## 2017-04-16 DIAGNOSIS — Z79899 Other long term (current) drug therapy: Secondary | ICD-10-CM | POA: Diagnosis not present

## 2017-04-16 DIAGNOSIS — M5030 Other cervical disc degeneration, unspecified cervical region: Secondary | ICD-10-CM | POA: Diagnosis not present

## 2017-05-18 DIAGNOSIS — G894 Chronic pain syndrome: Secondary | ICD-10-CM | POA: Diagnosis not present

## 2017-05-18 DIAGNOSIS — Z79891 Long term (current) use of opiate analgesic: Secondary | ICD-10-CM | POA: Diagnosis not present

## 2017-05-18 DIAGNOSIS — Z79899 Other long term (current) drug therapy: Secondary | ICD-10-CM | POA: Diagnosis not present

## 2017-05-18 DIAGNOSIS — M961 Postlaminectomy syndrome, not elsewhere classified: Secondary | ICD-10-CM | POA: Diagnosis not present

## 2017-05-18 DIAGNOSIS — M5412 Radiculopathy, cervical region: Secondary | ICD-10-CM | POA: Diagnosis not present

## 2017-05-18 DIAGNOSIS — M5030 Other cervical disc degeneration, unspecified cervical region: Secondary | ICD-10-CM | POA: Diagnosis not present

## 2017-05-29 ENCOUNTER — Encounter (HOSPITAL_COMMUNITY): Payer: Self-pay | Admitting: Emergency Medicine

## 2017-05-29 ENCOUNTER — Inpatient Hospital Stay (HOSPITAL_COMMUNITY)
Admission: EM | Admit: 2017-05-29 | Discharge: 2017-06-02 | DRG: 392 | Disposition: A | Discharge status: Home / Self Care | Payer: Medicare Other | Attending: Family Medicine | Admitting: Family Medicine

## 2017-05-29 ENCOUNTER — Emergency Department (HOSPITAL_COMMUNITY): Payer: Medicare Other

## 2017-05-29 DIAGNOSIS — I161 Hypertensive emergency: Secondary | ICD-10-CM

## 2017-05-29 DIAGNOSIS — Z888 Allergy status to other drugs, medicaments and biological substances status: Secondary | ICD-10-CM

## 2017-05-29 DIAGNOSIS — R112 Nausea with vomiting, unspecified: Secondary | ICD-10-CM | POA: Diagnosis present

## 2017-05-29 DIAGNOSIS — K3184 Gastroparesis: Principal | ICD-10-CM | POA: Diagnosis present

## 2017-05-29 DIAGNOSIS — Z7951 Long term (current) use of inhaled steroids: Secondary | ICD-10-CM

## 2017-05-29 DIAGNOSIS — F419 Anxiety disorder, unspecified: Secondary | ICD-10-CM | POA: Diagnosis not present

## 2017-05-29 DIAGNOSIS — M797 Fibromyalgia: Secondary | ICD-10-CM | POA: Diagnosis not present

## 2017-05-29 DIAGNOSIS — E872 Acidosis: Secondary | ICD-10-CM | POA: Diagnosis not present

## 2017-05-29 DIAGNOSIS — Z85038 Personal history of other malignant neoplasm of large intestine: Secondary | ICD-10-CM

## 2017-05-29 DIAGNOSIS — I10 Essential (primary) hypertension: Secondary | ICD-10-CM

## 2017-05-29 DIAGNOSIS — R519 Headache, unspecified: Secondary | ICD-10-CM

## 2017-05-29 DIAGNOSIS — E860 Dehydration: Secondary | ICD-10-CM | POA: Diagnosis not present

## 2017-05-29 DIAGNOSIS — Z9049 Acquired absence of other specified parts of digestive tract: Secondary | ICD-10-CM

## 2017-05-29 DIAGNOSIS — E876 Hypokalemia: Secondary | ICD-10-CM

## 2017-05-29 DIAGNOSIS — K5900 Constipation, unspecified: Secondary | ICD-10-CM

## 2017-05-29 DIAGNOSIS — I2510 Atherosclerotic heart disease of native coronary artery without angina pectoris: Secondary | ICD-10-CM | POA: Diagnosis present

## 2017-05-29 DIAGNOSIS — R109 Unspecified abdominal pain: Secondary | ICD-10-CM | POA: Diagnosis not present

## 2017-05-29 DIAGNOSIS — Z7982 Long term (current) use of aspirin: Secondary | ICD-10-CM

## 2017-05-29 DIAGNOSIS — E785 Hyperlipidemia, unspecified: Secondary | ICD-10-CM | POA: Diagnosis present

## 2017-05-29 DIAGNOSIS — R7989 Other specified abnormal findings of blood chemistry: Secondary | ICD-10-CM

## 2017-05-29 DIAGNOSIS — Z8249 Family history of ischemic heart disease and other diseases of the circulatory system: Secondary | ICD-10-CM

## 2017-05-29 DIAGNOSIS — I361 Nonrheumatic tricuspid (valve) insufficiency: Secondary | ICD-10-CM | POA: Diagnosis present

## 2017-05-29 DIAGNOSIS — R1084 Generalized abdominal pain: Secondary | ICD-10-CM

## 2017-05-29 DIAGNOSIS — E782 Mixed hyperlipidemia: Secondary | ICD-10-CM

## 2017-05-29 DIAGNOSIS — R51 Headache: Secondary | ICD-10-CM | POA: Diagnosis not present

## 2017-05-29 DIAGNOSIS — Z9114 Patient's other noncompliance with medication regimen: Secondary | ICD-10-CM

## 2017-05-29 DIAGNOSIS — Z79899 Other long term (current) drug therapy: Secondary | ICD-10-CM

## 2017-05-29 DIAGNOSIS — F329 Major depressive disorder, single episode, unspecified: Secondary | ICD-10-CM | POA: Diagnosis not present

## 2017-05-29 DIAGNOSIS — K589 Irritable bowel syndrome without diarrhea: Secondary | ICD-10-CM | POA: Diagnosis present

## 2017-05-29 DIAGNOSIS — Z8614 Personal history of Methicillin resistant Staphylococcus aureus infection: Secondary | ICD-10-CM

## 2017-05-29 DIAGNOSIS — K219 Gastro-esophageal reflux disease without esophagitis: Secondary | ICD-10-CM | POA: Diagnosis present

## 2017-05-29 DIAGNOSIS — G2581 Restless legs syndrome: Secondary | ICD-10-CM | POA: Diagnosis present

## 2017-05-29 LAB — COMPREHENSIVE METABOLIC PANEL
ALT: 16 U/L (ref 14–54)
ANION GAP: 14 (ref 5–15)
AST: 26 U/L (ref 15–41)
Albumin: 4.6 g/dL (ref 3.5–5.0)
Alkaline Phosphatase: 104 U/L (ref 38–126)
BUN: 19 mg/dL (ref 6–20)
CALCIUM: 9.9 mg/dL (ref 8.9–10.3)
CHLORIDE: 99 mmol/L — AB (ref 101–111)
CO2: 27 mmol/L (ref 22–32)
CREATININE: 1.07 mg/dL — AB (ref 0.44–1.00)
GFR, EST AFRICAN AMERICAN: 60 mL/min — AB (ref >60)
GFR, EST NON AFRICAN AMERICAN: 51 mL/min — AB (ref >60)
Glucose, Bld: 186 mg/dL — ABNORMAL HIGH (ref 65–99)
Potassium: 4.2 mmol/L (ref 3.5–5.1)
SODIUM: 140 mmol/L (ref 135–145)
Total Bilirubin: 0.9 mg/dL (ref 0.3–1.2)
Total Protein: 8.2 g/dL — ABNORMAL HIGH (ref 6.5–8.1)

## 2017-05-29 LAB — URINALYSIS, ROUTINE W REFLEX MICROSCOPIC
BILIRUBIN URINE: NEGATIVE
GLUCOSE, UA: 50 mg/dL — AB
Ketones, ur: NEGATIVE mg/dL
NITRITE: NEGATIVE
PROTEIN: 30 mg/dL — AB
Specific Gravity, Urine: 1.013 (ref 1.005–1.030)
pH: 7 (ref 5.0–8.0)

## 2017-05-29 LAB — PROTIME-INR
INR: 1.02
Prothrombin Time: 13.3 seconds (ref 11.4–15.2)

## 2017-05-29 LAB — I-STAT CG4 LACTIC ACID, ED
LACTIC ACID, VENOUS: 2.63 mmol/L — AB (ref 0.5–1.9)
Lactic Acid, Venous: 2.11 mmol/L (ref 0.5–1.9)

## 2017-05-29 LAB — I-STAT TROPONIN, ED: Troponin i, poc: 0 ng/mL (ref 0.00–0.08)

## 2017-05-29 LAB — CBC
HCT: 48.3 % — ABNORMAL HIGH (ref 36.0–46.0)
HEMOGLOBIN: 16.3 g/dL — AB (ref 12.0–15.0)
MCH: 29.1 pg (ref 26.0–34.0)
MCHC: 33.7 g/dL (ref 30.0–36.0)
MCV: 86.1 fL (ref 78.0–100.0)
PLATELETS: 349 10*3/uL (ref 150–400)
RBC: 5.61 MIL/uL — AB (ref 3.87–5.11)
RDW: 14.1 % (ref 11.5–15.5)
WBC: 12.1 10*3/uL — AB (ref 4.0–10.5)

## 2017-05-29 LAB — LIPASE, BLOOD: LIPASE: 26 U/L (ref 11–51)

## 2017-05-29 MED ORDER — SODIUM CHLORIDE 0.9 % IV SOLN
INTRAVENOUS | Status: DC
  Administered 2017-05-29 – 2017-06-01 (×6): via INTRAVENOUS

## 2017-05-29 MED ORDER — IOPAMIDOL (ISOVUE-300) INJECTION 61%
INTRAVENOUS | Status: AC
  Administered 2017-05-29: 100 mL
  Filled 2017-05-29: qty 100

## 2017-05-29 MED ORDER — ROPINIROLE HCL 1 MG PO TABS
2.0000 mg | ORAL_TABLET | Freq: Every day | ORAL | Status: DC
  Administered 2017-05-30 – 2017-06-01 (×3): 2 mg via ORAL
  Filled 2017-05-29 (×4): qty 2

## 2017-05-29 MED ORDER — MORPHINE SULFATE (PF) 4 MG/ML IV SOLN: 2.0000 mg | INTRAVENOUS | Status: DC | PRN

## 2017-05-29 MED ORDER — FLUTICASONE PROPIONATE 50 MCG/ACT NA SUSP
2.0000 | Freq: Every day | NASAL | Status: DC | PRN
  Filled 2017-05-29: qty 16

## 2017-05-29 MED ORDER — ASPIRIN EC 81 MG PO TBEC
81.0000 mg | DELAYED_RELEASE_TABLET | Freq: Every day | ORAL | Status: DC
  Administered 2017-05-30 – 2017-05-31 (×2): 81 mg via ORAL
  Filled 2017-05-29 (×3): qty 1

## 2017-05-29 MED ORDER — TIZANIDINE HCL 4 MG PO TABS
4.0000 mg | ORAL_TABLET | Freq: Three times a day (TID) | ORAL | Status: DC | PRN
  Filled 2017-05-29: qty 1

## 2017-05-29 MED ORDER — LORAZEPAM 2 MG/ML IJ SOLN
1.0000 mg | Freq: Once | INTRAMUSCULAR | Status: AC
  Administered 2017-05-29: 1 mg via INTRAVENOUS
  Filled 2017-05-29: qty 1

## 2017-05-29 MED ORDER — LABETALOL HCL 5 MG/ML IV SOLN
10.0000 mg | Freq: Once | INTRAVENOUS | Status: AC
  Administered 2017-05-29: 10 mg via INTRAVENOUS

## 2017-05-29 MED ORDER — ISOSORBIDE MONONITRATE ER 60 MG PO TB24
60.0000 mg | ORAL_TABLET | Freq: Every day | ORAL | Status: DC
  Administered 2017-05-29 – 2017-06-02 (×5): 60 mg via ORAL
  Filled 2017-05-29 (×5): qty 1

## 2017-05-29 MED ORDER — LOSARTAN POTASSIUM 50 MG PO TABS
25.0000 mg | ORAL_TABLET | Freq: Every day | ORAL | Status: DC
  Administered 2017-05-29 – 2017-06-02 (×5): 25 mg via ORAL
  Filled 2017-05-29 (×5): qty 1

## 2017-05-29 MED ORDER — LABETALOL HCL 5 MG/ML IV SOLN
10.0000 mg | Freq: Once | INTRAVENOUS | Status: AC
  Administered 2017-05-29: 10 mg via INTRAVENOUS
  Filled 2017-05-29: qty 4

## 2017-05-29 MED ORDER — ENOXAPARIN SODIUM 40 MG/0.4ML ~~LOC~~ SOLN
40.0000 mg | SUBCUTANEOUS | Status: DC
  Administered 2017-05-29 – 2017-06-01 (×4): 40 mg via SUBCUTANEOUS
  Filled 2017-05-29 (×4): qty 0.4

## 2017-05-29 MED ORDER — ONDANSETRON HCL 4 MG/2ML IJ SOLN
4.0000 mg | Freq: Three times a day (TID) | INTRAMUSCULAR | Status: DC
  Administered 2017-05-29 – 2017-06-01 (×9): 4 mg via INTRAVENOUS
  Filled 2017-05-29 (×10): qty 2

## 2017-05-29 MED ORDER — LORAZEPAM 1 MG PO TABS
2.0000 mg | ORAL_TABLET | Freq: Every day | ORAL | Status: DC
  Administered 2017-05-29 – 2017-06-01 (×4): 2 mg via ORAL
  Filled 2017-05-29 (×4): qty 2

## 2017-05-29 MED ORDER — OMEGA-3-ACID ETHYL ESTERS 1 G PO CAPS
1.0000 g | ORAL_CAPSULE | Freq: Two times a day (BID) | ORAL | Status: DC
  Administered 2017-05-30: 1 g via ORAL
  Filled 2017-05-29 (×5): qty 1

## 2017-05-29 MED ORDER — ONDANSETRON HCL 4 MG/2ML IJ SOLN
4.0000 mg | Freq: Once | INTRAMUSCULAR | Status: AC
  Administered 2017-05-29: 4 mg via INTRAVENOUS
  Filled 2017-05-29: qty 2

## 2017-05-29 MED ORDER — MORPHINE SULFATE (PF) 4 MG/ML IV SOLN
2.0000 mg | INTRAVENOUS | Status: DC | PRN
Start: 2017-05-29 — End: 2017-06-02
  Administered 2017-05-29 – 2017-06-02 (×6): 2 mg via INTRAVENOUS
  Filled 2017-05-29 (×6): qty 1

## 2017-05-29 MED ORDER — ONDANSETRON HCL 4 MG/2ML IJ SOLN
4.0000 mg | Freq: Once | INTRAMUSCULAR | Status: AC
Start: 2017-05-29 — End: 2017-05-29
  Administered 2017-05-29: 4 mg via INTRAVENOUS
  Filled 2017-05-29: qty 2

## 2017-05-29 MED ORDER — ACETAMINOPHEN 650 MG RE SUPP: 650.0000 mg | Freq: Four times a day (QID) | RECTAL | Status: DC | PRN

## 2017-05-29 MED ORDER — ONDANSETRON HCL 4 MG/2ML IJ SOLN
4.0000 mg | Freq: Four times a day (QID) | INTRAMUSCULAR | Status: DC | PRN
  Administered 2017-05-29 – 2017-06-02 (×7): 4 mg via INTRAVENOUS
  Filled 2017-05-29 (×7): qty 2

## 2017-05-29 MED ORDER — MILNACIPRAN HCL 50 MG PO TABS
100.0000 mg | ORAL_TABLET | Freq: Two times a day (BID) | ORAL | Status: DC
  Administered 2017-05-30: 100 mg via ORAL
  Filled 2017-05-29 (×9): qty 2

## 2017-05-29 MED ORDER — ACETAMINOPHEN 325 MG PO TABS
650.0000 mg | ORAL_TABLET | Freq: Four times a day (QID) | ORAL | Status: DC | PRN
  Administered 2017-05-30 – 2017-06-02 (×4): 650 mg via ORAL
  Filled 2017-05-29 (×4): qty 2

## 2017-05-29 MED ORDER — ESCITALOPRAM OXALATE 10 MG PO TABS
5.0000 mg | ORAL_TABLET | Freq: Every day | ORAL | Status: DC
Start: 2017-05-30 — End: 2017-06-02
  Administered 2017-05-30 – 2017-06-01 (×3): 5 mg via ORAL
  Filled 2017-05-29 (×4): qty 1

## 2017-05-29 MED ORDER — ROSUVASTATIN CALCIUM 20 MG PO TABS
20.0000 mg | ORAL_TABLET | Freq: Every day | ORAL | Status: DC
  Administered 2017-05-30 – 2017-06-01 (×3): 20 mg via ORAL
  Filled 2017-05-29 (×5): qty 1

## 2017-05-29 MED ORDER — SODIUM CHLORIDE 0.9 % IV BOLUS (SEPSIS)
1000.0000 mL | Freq: Once | INTRAVENOUS | Status: AC
  Administered 2017-05-29: 1000 mL via INTRAVENOUS

## 2017-05-29 MED ORDER — ROPINIROLE HCL 1 MG PO TABS
1.0000 mg | ORAL_TABLET | Freq: Every day | ORAL | Status: DC
  Administered 2017-05-31: 1 mg via ORAL
  Filled 2017-05-29 (×3): qty 1

## 2017-05-29 MED ORDER — FENTANYL CITRATE (PF) 100 MCG/2ML IJ SOLN
50.0000 ug | Freq: Once | INTRAMUSCULAR | Status: AC
  Administered 2017-05-29: 50 ug via INTRAVENOUS
  Filled 2017-05-29: qty 2

## 2017-05-29 MED ORDER — HYDRALAZINE HCL 20 MG/ML IJ SOLN: 10.0000 mg | Freq: Four times a day (QID) | INTRAMUSCULAR | Status: DC | PRN

## 2017-05-29 MED ORDER — METOPROLOL TARTRATE 25 MG PO TABS
25.0000 mg | ORAL_TABLET | Freq: Two times a day (BID) | ORAL | Status: DC
  Administered 2017-05-29 – 2017-06-01 (×6): 25 mg via ORAL
  Filled 2017-05-29 (×6): qty 1

## 2017-05-29 NOTE — Care Management Note (Signed)
Case Management Note  Patient Details  Name: Angela [name] MRN: [id number] Date of Birth: [birth year]  Subjective/Objective:                  n/v dehydration  Action/Plan: CM spoke with the patient and her daughter at the bedside. Patient lives with her husband, daughter and granddaughter. She reports no issues with transportation to physician appointments or affording her medications. Unit CM to continue to follow for discharge needs.   Expected Discharge Date:   unknown             Expected Discharge Plan:  Home/Self Care  In-House Referral:     Discharge planning Services  CM Consult  Post Acute Care Choice:    Choice offered to:     DME Arranged:    DME Agency:     HH Arranged:    HH Agency:     Status of Service:  In process, will continue to follow  If discussed at Long Length of Stay Meetings, dates discussed:    Additional Comments:  Apolonio Schneiders, RN 05/29/2017, 6:22 PM

## 2017-05-29 NOTE — H&P (Signed)
[facility] History and Physical  Angela [name] DPO:[id number] DOB: January 08, 1946 DOA: 05/29/2017  PCP: [name], MD  Patient coming from: Home  Chief Complaint: Nausea, vomiting, abdominal pain  HPI: Angela [name] is a 71 y.o. female with a medical history of fibromyalgia, hypertension, hyperlipidemia, who presents emergency department with complaints of nausea, vomiting, abdominal pain that has been ongoing since Christmas Day. Patient states she's been unable to tolerate any of her oral medications. She's been trying sublingual Zofran with resolution of her symptoms. Patient Angela [name] complains of abdominal soreness. She has also had headache. Denies any recent ill contacts or illness. Denies any current chest pain, shortness of breath, diarrhea, constipation, dizziness, painful or frequent urination. Patient states this occurred before. She has followed up with Dr. Penelope Coop, gastroenterologist. It has been about a year. She states she had some type of emptying study which did show delay.  ED Course: CT head, abdomen unremarkable. UA as well chest x-ray unremarkable. Labs within normal limits.TRH called for admission.  Review of Systems:  All other systems reviewed and are negative.   Past Medical History:  Diagnosis Date  . Anxiety   . Cancer of sigmoid ([place])   . Depression (emotion)   . DJD (degenerative joint disease)   . Fibromyalgia   . GERD (gastroesophageal reflux disease)   . Hemorrhoids   . Hyperlipidemia   . Hypertension   . IBS (irritable bowel syndrome)   . MRSA (methicillin resistant Staphylococcus aureus) septicemia ([place])    history of  . Over weight   . Pancreatitis   . RLS (restless legs syndrome)   . Tachycardia     Past Surgical History:  Procedure Laterality Date  . CARDIAC CATHETERIZATION [name] 12/26/2015   Procedure: Left Heart Cath and Coronary Angiography;  Surgeon: Troy Sine, MD;  Location: [facility];  Service: Cardiovascular;   Laterality: [name];  . child birth      Social History:  reports that  has never smoked. she has never used smokeless tobacco. She reports that she does not drink alcohol or use drugs.  Allergies  Allergen Reactions  . Norvasc [Amlodipine Besylate] Swelling  . Phenergan [Promethazine] Anaphylaxis  . Timentin [Ticarcillin-Pot Clavulanate] Anaphylaxis    Has patient had a PCN reaction causing immediate rash, facial/tongue/throat swelling, SOB or lightheadedness with hypotension: Yes Has patient had a PCN reaction causing severe rash involving mucus membranes or skin necrosis: Yes Has patient had a PCN reaction that required hospitalization Yes Has patient had a PCN reaction occurring within the last 10 years: No If all of the above answers are "NO", then may proceed with Cephalosporin use.     Family History  Problem Relation Age of Onset  . Hypertension Mother   . Heart disease Mother   . Heart attack Father   . Stroke Father   . Diabetes Sister   . Hypertension Sister   . Stroke Unknown   . Diabetes Unknown   . Heart disease Unknown      Prior to Admission medications   Medication Sig Start Date End Date Taking? Authorizing Provider  aspirin EC 81 MG tablet Take 81 mg by mouth daily.    [provider]  Cyanocobalamin (B-12) 2500 MCG TABS Take 1 tablet by mouth daily.    [provider]  escitalopram (LEXAPRO) 5 MG tablet Take 1 tablet by mouth daily.  06/05/16   [provider]  fluticasone (FLONASE) 50 MCG/ACT nasal spray Place 2 sprays into  both nostrils daily as needed for allergies or rhinitis.     [provider]  isosorbide mononitrate (IMDUR) 30 MG 24 hr tablet Take 1 tablet (30 mg total) by mouth daily. Patient taking differently: Take 60 mg by mouth daily.  07/08/16 10/16/16  [name] M, PA-C  LORazepam (ATIVAN) 2 MG tablet Take 2 mg by mouth at bedtime.     [provider]  losartan (COZAAR) 25 MG tablet Take 1 tablet  (25 mg total) by mouth daily. 01/16/17   Dorothy Spark, MD  metoprolol tartrate (LOPRESSOR) 50 MG tablet Take 1 tablet (50 mg total) by mouth 2 (two) times daily. 08/05/16 11/03/16  Dorothy Spark, MD  nitroGLYCERIN (NITROSTAT) 0.4 MG SL tablet Place 1 tablet (0.4 mg total) under the tongue every 5 (five) minutes as needed for chest pain. 01/08/16 07/08/16  Imogene Burn, PA-C  omega-3 acid ethyl esters (LOVAZA) 1 g capsule Take 1 g by mouth 2 (two) times daily.    [provider]  ondansetron (ZOFRAN-ODT) 4 MG disintegrating tablet Take 1 tablet (4 mg total) by mouth every 6 (six) hours as needed for nausea or vomiting. 05/18/16   Domenic Polite, MD  pantoprazole (PROTONIX) 40 MG tablet Take 1 tablet by mouth daily. 06/04/16   [provider]  rOPINIRole (REQUIP) 1 MG tablet Take 1-2 mg by mouth See admin instructions. ONE TABLET  IN AM TWO TABLETS IN PM     [provider]  rosuvastatin (CRESTOR) 20 MG tablet Take 1 tablet (20 mg total) by mouth daily. 07/08/16 10/16/16  Rosita Fire, Brittainy M, PA-C  SAVELLA TITRATION PACK 12.5 & 25 & 50 MG MISC 100 mg daily.  06/05/16   [provider]  tiZANidine (ZANAFLEX) 4 MG capsule Take 4 mg by mouth every 8 (eight) hours as needed for muscle spasms.    [provider]    Physical Exam: Vitals:   05/29/17 1700 05/29/17 1715  BP: (!) 165/89   Pulse:    Resp:  (!) 21  Temp:    SpO2: 98%      General: Well developed, well nourished, NAD, appears stated age  HEENT: NCAT, PERRLA, EOMI, Anicteic Sclera, mucous membranes dry.   Neck: Supple, no JVD, no masses  Cardiovascular: S1 S2 auscultated, no rubs, murmurs or gallops. Regular rate and rhythm.  Respiratory: Clear to auscultation bilaterally with equal chest rise  Abdomen: Soft, mildly TTP, nondistended, + bowel sounds  Extremities: warm dry without cyanosis clubbing or edema  Neuro: AAOx3, cranial nerves grossly intact. Strength 5/5 in patient's upper  and lower extremities bilaterally  Skin: Without rashes exudates or nodules  Psych: Normal affect and demeanor with intact judgement and insight  Labs on Admission: I have personally reviewed following labs and imaging studies CBC: Recent Labs  Lab 05/29/17 1001  WBC 12.1*  HGB 16.3*  HCT 48.3*  MCV 86.1  PLT 416   Basic Metabolic Panel: Recent Labs  Lab 05/29/17 1001  NA 140  K 4.2  CL 99*  CO2 27  GLUCOSE 186*  BUN 19  CREATININE 1.07*  CALCIUM 9.9   GFR: Estimated Creatinine Clearance: 49.5 mL/min (A) (by C-G formula based on SCr of 1.07 mg/dL (H)). Liver Function Tests: Recent Labs  Lab 05/29/17 1001  AST 26  ALT 16  ALKPHOS 104  BILITOT 0.9  PROT 8.2*  ALBUMIN 4.6   Recent Labs  Lab 05/29/17 1001  LIPASE 26   No results for input(s): AMMONIA  in the last 168 hours. Coagulation Profile: Recent Labs  Lab 05/29/17 1316  INR 1.02   Cardiac Enzymes: No results for input(s): CKTOTAL, CKMB, CKMBINDEX, TROPONINI in the last 168 hours. BNP (last 3 results) No results for input(s): PROBNP in the last 8760 hours. HbA1C: No results for input(s): HGBA1C in the last 72 hours. CBG: No results for input(s): GLUCAP in the last 168 hours. Lipid Profile: No results for input(s): CHOL, HDL, LDLCALC, TRIG, CHOLHDL, LDLDIRECT in the last 72 hours. Thyroid Function Tests: No results for input(s): TSH, T4TOTAL, FREET4, T3FREE, THYROIDAB in the last 72 hours. Anemia Panel: No results for input(s): VITAMINB12, FOLATE, FERRITIN, TIBC, IRON, RETICCTPCT in the last 72 hours. Urine analysis:    Component Value Date/Time   COLORURINE STRAW (A) 05/29/2017 1416   APPEARANCEUR CLEAR 05/29/2017 1416   LABSPEC 1.013 05/29/2017 1416   PHURINE 7.0 05/29/2017 1416   GLUCOSEU 50 (A) 05/29/2017 1416   HGBUR SMALL (A) 05/29/2017 1416   BILIRUBINUR NEGATIVE 05/29/2017 1416   KETONESUR NEGATIVE 05/29/2017 1416   PROTEINUR 30 (A) 05/29/2017 1416   UROBILINOGEN 1.0 12/24/2010  1437   NITRITE NEGATIVE 05/29/2017 1416   LEUKOCYTESUR TRACE (A) 05/29/2017 1416   Sepsis Labs: @LABRCNTIP (procalcitonin:4,lacticidven:4) )No results found for this or any previous visit (from the past 240 hour(s)).   Radiological Exams on Admission: Dg Chest 2 View  Result Date: 05/29/2017 CLINICAL DATA:  Nausea and vomiting EXAM: CHEST  2 VIEW COMPARISON:  09/30/2013, 02/05/2013 FINDINGS: Postsurgical changes in the cervical spine. No consolidation or pleural effusion. Normal cardiomediastinal silhouette. No pneumothorax. Mild degenerative changes of the spine IMPRESSION: No active cardiopulmonary disease. Electronically Signed   By: Donavan Foil M.D.   On: 05/29/2017 14:47   Ct Head Wo Contrast  Result Date: 05/29/2017 CLINICAL DATA:  Headache and hypertension. EXAM: CT HEAD WITHOUT CONTRAST TECHNIQUE: Contiguous axial images were obtained from the base of the skull through the vertex without intravenous contrast. COMPARISON:  None. FINDINGS: Brain: No evidence of acute infarction, hemorrhage, hydrocephalus, extra-axial collection or mass lesion/mass effect. Age-related cerebral atrophy with compensatory dilatation of the ventricles. Vascular: No hyperdense vessel or unexpected calcification. Skull: Normal. Negative for fracture or focal lesion. Sinuses/Orbits: No acute finding. Other: None. IMPRESSION: 1. Normal for age noncontrast head CT. Electronically Signed   By: Titus Dubin M.D.   On: 05/29/2017 15:12   Ct Abdomen Pelvis W Contrast  Result Date: 05/29/2017 CLINICAL DATA:  Nausea and vomiting with abdominal pain. Prior partial colectomy for colon cancer. No bowel movement for the past 4 days. EXAM: CT ABDOMEN AND PELVIS WITH CONTRAST TECHNIQUE: Multidetector CT imaging of the abdomen and pelvis was performed using the standard protocol following bolus administration of intravenous contrast. CONTRAST:  129mL ISOVUE-300 IOPAMIDOL (ISOVUE-300) INJECTION 61% COMPARISON:  CT abdomen  pelvis dated May 16, 2016. FINDINGS: Lower chest: No acute abnormality. Stable scarring and subsegmental atelectasis in the right lower lobe. Unchanged triangular, subpleural nodule in the left lower lobe, likely a lymph node. Hepatobiliary: Unchanged hemangioma in hepatic dome, stable since 2014. Unchanged focal fat along the falciform ligament. Prior cholecystectomy. Stable mild extrahepatic biliary dilatation. Pancreas: Unremarkable. No pancreatic ductal dilatation or surrounding inflammatory changes. Spleen: Normal in size without focal abnormality. Adrenals/Urinary Tract: The adrenal glands are unremarkable. Stable bilateral renal cysts. No renal or ureteral calculi. No hydronephrosis. The bladder is unremarkable. Stomach/Bowel: Stable small hiatal hernia. Postsurgical changes from prior partial colectomy again noted. No bowel obstruction, wall thickening, or surrounding inflammatory changes. Vascular/Lymphatic: Mild  aortic atherosclerosis. No enlarged abdominal or pelvic lymph nodes. Reproductive: Status post hysterectomy. No adnexal masses. Other: No free fluid or pneumoperitoneum. Musculoskeletal: No acute or significant osseous findings. Degenerative changes of the thoracolumbar spine. IMPRESSION: 1. No acute intra-abdominal process. Prior partial colectomy. No bowel obstruction. 2.  Aortic atherosclerosis (ICD10-I70.0). Electronically Signed   By: Titus Dubin M.D.   On: 05/29/2017 15:35    EKG: Independently reviewed. Sinus tachycardia, rate 100, prolonged QTC 520  Assessment/Plan  Nausea, vomiting, abdominal pain -Possibly secondary to questionable gastroparesis (no history of diabetes) -Patient has tried taking sublingual Zofran without resolution of her symptoms. Cannot tolerate Reglan or Phenergan. -CT abdomen showed no acute interval abdominal process. No bowel obstruction. -UA unremarkable for infection -LFTs and lipase within normal limits -Placed on IV fluids, scheduled as  well as as needed IV Zofran (will monitor QTc), clear liquid diet if possible -Has seen Dr. Penelope Coop in the past  Accelerated hypertension -Suspect secondary to patient's inability to tolerate oral medications -Will restart patient's home medications of Imdur, Cozaar, metoprolol -Will place on IV hydralazine  Elevated lactic acid -Likely secondary to the above along with dehydration -Placed on IV fluids and continue to monitor  Headache -Suspect secondary to the above, dehydration and heart hypertension -CT head unremarkable -Will place on morphine as needed  Fibromyalgia/restless leg syndrome -Continue Savella, zanaflex, Requip  Hyperlipidemia -Continue Lovaza, statin  Depression/anxiety -Continue Lexapro, Ativan  Dehydration -secondary to the above -Patient with hemoconcentration, hemoglobin 16.3  DVT prophylaxis: Lovenox  Code Status: Full  Family Communication: Daughter at bedside. Admission, patients condition and plan of care including tests being ordered have been discussed with the patient and daughter who indicate understanding and agree with the plan and Code Status.  Disposition Plan: Home   Consults called: None   Admission status: Observation   Time spent: 70 minutes  Orvel Cutsforth D.O. [facility] Pager [phone]  If 7PM-7AM, please contact night-coverage [email or web address] Password [facility] 05/29/2017, 5:43 PM

## 2017-05-29 NOTE — ED Triage Notes (Signed)
I have gastroparesis

## 2017-05-29 NOTE — ED Notes (Signed)
Pt ambulated to restroom with assistance.  

## 2017-05-29 NOTE — ED Triage Notes (Signed)
Pt. Stated, Im probably dehydrated. Ive been vomiting and a back ache since Christmas

## 2017-05-29 NOTE — Care Management Obs Status (Signed)
[place] NOTIFICATION   Patient Details  Name: LENNIX ROTUNDO MRN: [id number] Date of Birth: 08-08-1945   Medicare Observation Status Notification Given:  Yes    Apolonio Schneiders, RN 05/29/2017, 5:40 PM

## 2017-05-29 NOTE — ED Provider Notes (Signed)
[facility] EMERGENCY DEPARTMENT Provider Note   CSN: [id number] Arrival date & time: 05/29/17  0932     History   Chief Complaint Chief Complaint  Patient presents with  . Emesis  . Back Pain  . Nausea  . Dehydration    HPI Angela [name] is a 71 y.o. female.  The history is provided by the patient and medical records. No language interpreter was used.  Abdominal Pain   This is a new problem. The current episode started more than 2 days ago. The problem occurs constantly. The problem has not changed since onset.The pain is associated with an unknown factor. The pain is located in the generalized abdominal region. The quality of the pain is aching and cramping. The pain is severe. Associated symptoms include anorexia, nausea, vomiting, constipation and headaches. Pertinent negatives include fever, diarrhea, dysuria and frequency. The symptoms are aggravated by palpation and eating. Nothing relieves the symptoms.  Headache   This is a new problem. The current episode started more than 2 days ago. The problem occurs constantly. The problem has been gradually worsening. The headache is associated with nothing. The pain is at a severity of 10/10. The pain is severe. The pain does not radiate. Associated symptoms include anorexia, malaise/fatigue, nausea and vomiting. Pertinent negatives include no fever, no chest pressure, no palpitations, no syncope and no shortness of breath. She has tried nothing for the symptoms. The treatment provided no relief.    Past Medical History:  Diagnosis Date  . Anxiety   . Cancer of sigmoid ([place])   . Depression (emotion)   . DJD (degenerative joint disease)   . Fibromyalgia   . GERD (gastroesophageal reflux disease)   . Hemorrhoids   . Hyperlipidemia   . Hypertension   . IBS (irritable bowel syndrome)   . MRSA (methicillin resistant Staphylococcus aureus) septicemia ([place])    history of  . Over weight   . Pancreatitis   . RLS  (restless legs syndrome)   . Tachycardia     Patient Active Problem List   Diagnosis Date Noted  . Dehydration 05/16/2016  . Muscle ache of extremity 02/20/2016  . CAD (coronary artery disease) 01/08/2016  . Abnormal stress test 12/12/2015  . Chest pain 11/27/2015  . Family history of early CAD 11/27/2015  . Hyperlipidemia 11/27/2015    Past Surgical History:  Procedure Laterality Date  . CARDIAC CATHETERIZATION [name] 12/26/2015   Procedure: Left Heart Cath and Coronary Angiography;  Surgeon: Troy Sine, MD;  Location: [facility];  Service: Cardiovascular;  Laterality: [name];  . child birth      OB History    No data available       Home Medications    Prior to Admission medications   Medication Sig Start Date End Date Taking? Authorizing Provider  aspirin EC 81 MG tablet Take 81 mg by mouth daily.    [provider]  Cyanocobalamin (B-12) 2500 MCG TABS Take 1 tablet by mouth daily.    [provider]  escitalopram (LEXAPRO) 5 MG tablet Take 1 tablet by mouth daily.  06/05/16   [provider]  fluticasone (FLONASE) 50 MCG/ACT nasal spray Place 2 sprays into both nostrils daily as needed for allergies or rhinitis.     [provider]  isosorbide mononitrate (IMDUR) 30 MG 24 hr tablet Take 1 tablet (30 mg total) by mouth daily. Patient taking differently: Take 60 mg by mouth daily.  07/08/16 10/16/16  Simmons, Brittainy M, PA-C  LORazepam (ATIVAN) 2 MG tablet Take 2 mg by mouth at bedtime.     [provider]  losartan (COZAAR) 25 MG tablet Take 1 tablet (25 mg total) by mouth daily. 01/16/17   Dorothy Spark, MD  metoCLOPramide (REGLAN) 5 MG tablet Take 2.5 mg by mouth 4 (four) times daily. Taking BID hoping to increase to TID    [provider]  metoprolol tartrate (LOPRESSOR) 50 MG tablet Take 1 tablet (50 mg total) by mouth 2 (two) times daily. 08/05/16 11/03/16  Dorothy Spark, MD  Morphine-Naltrexone (EMBEDA)  50-2 MG CPCR Take 1 tablet by mouth 2 (two) times daily.    [provider]  naloxegol oxalate (MOVANTIK) 12.5 MG TABS tablet Take 12.5 mg by mouth daily.    [provider]  nitroGLYCERIN (NITROSTAT) 0.4 MG SL tablet Place 1 tablet (0.4 mg total) under the tongue every 5 (five) minutes as needed for chest pain. 01/08/16 07/08/16  Imogene Burn, PA-C  omega-3 acid ethyl esters (LOVAZA) 1 g capsule Take 1 g by mouth 2 (two) times daily.    [provider]  ondansetron (ZOFRAN-ODT) 4 MG disintegrating tablet Take 1 tablet (4 mg total) by mouth every 6 (six) hours as needed for nausea or vomiting. 05/18/16   Domenic Polite, MD  pantoprazole (PROTONIX) 40 MG tablet Take 1 tablet by mouth daily. 06/04/16   [provider]  rOPINIRole (REQUIP) 1 MG tablet Take 1-2 mg by mouth See admin instructions. ONE TABLET  IN AM TWO TABLETS IN PM     [provider]  rosuvastatin (CRESTOR) 20 MG tablet Take 1 tablet (20 mg total) by mouth daily. 07/08/16 10/16/16  Rosita Fire, Brittainy M, PA-C  SAVELLA TITRATION PACK 12.5 & 25 & 50 MG MISC 100 mg daily.  06/05/16   [provider]  tiZANidine (ZANAFLEX) 4 MG capsule Take 4 mg by mouth every 8 (eight) hours as needed for muscle spasms.    [provider]    Family History Family History  Problem Relation Age of Onset  . Hypertension Mother   . Heart disease Mother   . Heart attack Father   . Stroke Father   . Diabetes Sister   . Hypertension Sister   . Stroke Unknown   . Diabetes Unknown   . Heart disease Unknown     Social History Social History   Tobacco Use  . Smoking status: Never Smoker  . Smokeless tobacco: Never Used  Substance Use Topics  . Alcohol use: No    Alcohol/week: 0.0 oz  . Drug use: No     Allergies   Norvasc [amlodipine besylate]; Phenergan [promethazine]; and Timentin [ticarcillin-pot clavulanate]   Review of Systems Review of Systems  Constitutional: Positive for  malaise/fatigue. Negative for chills, diaphoresis, fatigue and fever.  HENT: Negative for congestion.   Eyes: Negative for visual disturbance.  Respiratory: Negative for cough, chest tightness, shortness of breath, wheezing and stridor.   Cardiovascular: Negative for chest pain, palpitations and syncope.  Gastrointestinal: Positive for abdominal pain, anorexia, constipation, nausea and vomiting. Negative for diarrhea.  Genitourinary: Positive for decreased urine volume. Negative for difficulty urinating, dyspareunia, dysuria, flank pain and frequency.  Musculoskeletal: Negative for back pain, neck pain and neck stiffness.  Skin: Negative for rash and wound.  Neurological: Positive for headaches. Negative for seizures and light-headedness.  Psychiatric/Behavioral: Negative for agitation.  All other systems reviewed and are negative.    Physical Exam Updated  Vital Signs BP (!) 195/128 (BP Location: Left Arm)   Pulse (!) 108   Temp 98.8 F (37.1 C) (Oral)   Resp 18   Ht 5\' 6"  (1.676 m)   Wt 71.2 kg (157 lb)   SpO2 100%   BMI 25.34 kg/m   Physical Exam  Constitutional: She is oriented to person, place, and time. She appears well-developed and well-nourished. No distress.  HENT:  Head: Normocephalic.  Nose: Nose normal.  Mouth/Throat: Oropharynx is clear and moist. No oropharyngeal exudate.  Eyes: Conjunctivae and EOM are normal. Pupils are equal, round, and reactive to light.  Neck: Normal range of motion.  Cardiovascular: Normal rate and intact distal pulses.  No murmur heard. Pulmonary/Chest: Effort normal and breath sounds normal. No stridor. No respiratory distress. She has no wheezes. She exhibits no tenderness.  Abdominal: Soft. Normal appearance. She exhibits no distension. There is generalized tenderness. There is no rigidity, no rebound, no guarding and no CVA tenderness.  Musculoskeletal: She exhibits no tenderness.  Neurological: She is alert and oriented to person,  place, and time. She is not disoriented. No cranial nerve deficit or sensory deficit. She exhibits normal muscle tone. GCS eye subscore is 4. GCS verbal subscore is 5. GCS motor subscore is 6.  Skin: Capillary refill takes less than 2 seconds. No rash noted. She is not diaphoretic. No erythema.  Psychiatric: She has a normal mood and affect.  Nursing note and vitals reviewed.    ED Treatments / Results  Labs (all labs ordered are listed, but only abnormal results are displayed) Labs Reviewed  COMPREHENSIVE METABOLIC PANEL - Abnormal; Notable for the following components:      Result Value   Chloride 99 (*)    Glucose, Bld 186 (*)    Creatinine, Ser 1.07 (*)    Total Protein 8.2 (*)    GFR calc non Af Amer 51 (*)    GFR calc Af Amer 60 (*)    All other components within normal limits  CBC - Abnormal; Notable for the following components:   WBC 12.1 (*)    RBC 5.61 (*)    Hemoglobin 16.3 (*)    HCT 48.3 (*)    All other components within normal limits  URINALYSIS, ROUTINE W REFLEX MICROSCOPIC - Abnormal; Notable for the following components:   Color, Urine STRAW (*)    Glucose, UA 50 (*)    Hgb urine dipstick SMALL (*)    Protein, ur 30 (*)    Leukocytes, UA TRACE (*)    Bacteria, UA RARE (*)    Squamous Epithelial / LPF 0-5 (*)    All other components within normal limits  I-STAT CG4 LACTIC ACID, ED - Abnormal; Notable for the following components:   Lactic Acid, Venous 2.63 (*)    All other components within normal limits  I-STAT CG4 LACTIC ACID, ED - Abnormal; Notable for the following components:   Lactic Acid, Venous 2.11 (*)    All other components within normal limits  URINE CULTURE  LIPASE, BLOOD  PROTIME-INR  BASIC METABOLIC PANEL  CBC  I-STAT TROPONIN, ED    EKG  EKG Interpretation  Date/Time:  Friday May 29 2017 11:58:18 EST Ventricular Rate:  100 PR Interval:    QRS Duration: 85 QT Interval:  403 QTC Calculation: 520 R Axis:   -10 Text  Interpretation:  Sinus tachycardia Right atrial enlargement Consider right ventricular hypertrophy Nonspecific T abnormalities, anterior leads Prolonged QT interval When compaored to prior, longer  QTc.  No STEMI Confirmed by Antony Blackbird [phone]) on 05/29/2017 1:47:59 PM       Radiology Dg Chest 2 View  Result Date: 05/29/2017 CLINICAL DATA:  Nausea and vomiting EXAM: CHEST  2 VIEW COMPARISON:  09/30/2013, 02/05/2013 FINDINGS: Postsurgical changes in the cervical spine. No consolidation or pleural effusion. Normal cardiomediastinal silhouette. No pneumothorax. Mild degenerative changes of the spine IMPRESSION: No active cardiopulmonary disease. Electronically Signed   By: Donavan Foil M.D.   On: 05/29/2017 14:47   Ct Head Wo Contrast  Result Date: 05/29/2017 CLINICAL DATA:  Headache and hypertension. EXAM: CT HEAD WITHOUT CONTRAST TECHNIQUE: Contiguous axial images were obtained from the base of the skull through the vertex without intravenous contrast. COMPARISON:  None. FINDINGS: Brain: No evidence of acute infarction, hemorrhage, hydrocephalus, extra-axial collection or mass lesion/mass effect. Age-related cerebral atrophy with compensatory dilatation of the ventricles. Vascular: No hyperdense vessel or unexpected calcification. Skull: Normal. Negative for fracture or focal lesion. Sinuses/Orbits: No acute finding. Other: None. IMPRESSION: 1. Normal for age noncontrast head CT. Electronically Signed   By: Titus Dubin M.D.   On: 05/29/2017 15:12   Ct Abdomen Pelvis W Contrast  Result Date: 05/29/2017 CLINICAL DATA:  Nausea and vomiting with abdominal pain. Prior partial colectomy for colon cancer. No bowel movement for the past 4 days. EXAM: CT ABDOMEN AND PELVIS WITH CONTRAST TECHNIQUE: Multidetector CT imaging of the abdomen and pelvis was performed using the standard protocol following bolus administration of intravenous contrast. CONTRAST:  110mL ISOVUE-300 IOPAMIDOL (ISOVUE-300)  INJECTION 61% COMPARISON:  CT abdomen pelvis dated May 16, 2016. FINDINGS: Lower chest: No acute abnormality. Stable scarring and subsegmental atelectasis in the right lower lobe. Unchanged triangular, subpleural nodule in the left lower lobe, likely a lymph node. Hepatobiliary: Unchanged hemangioma in hepatic dome, stable since 2014. Unchanged focal fat along the falciform ligament. Prior cholecystectomy. Stable mild extrahepatic biliary dilatation. Pancreas: Unremarkable. No pancreatic ductal dilatation or surrounding inflammatory changes. Spleen: Normal in size without focal abnormality. Adrenals/Urinary Tract: The adrenal glands are unremarkable. Stable bilateral renal cysts. No renal or ureteral calculi. No hydronephrosis. The bladder is unremarkable. Stomach/Bowel: Stable small hiatal hernia. Postsurgical changes from prior partial colectomy again noted. No bowel obstruction, wall thickening, or surrounding inflammatory changes. Vascular/Lymphatic: Mild aortic atherosclerosis. No enlarged abdominal or pelvic lymph nodes. Reproductive: Status post hysterectomy. No adnexal masses. Other: No free fluid or pneumoperitoneum. Musculoskeletal: No acute or significant osseous findings. Degenerative changes of the thoracolumbar spine. IMPRESSION: 1. No acute intra-abdominal process. Prior partial colectomy. No bowel obstruction. 2.  Aortic atherosclerosis (ICD10-I70.0). Electronically Signed   By: Titus Dubin M.D.   On: 05/29/2017 15:35    Procedures Procedures (including critical care time)  Medications Ordered in ED Medications  aspirin EC tablet 81 mg (81 mg Oral Not Given 05/29/17 2159)  escitalopram (LEXAPRO) tablet 5 mg (not administered)  LORazepam (ATIVAN) tablet 2 mg (2 mg Oral Given 05/29/17 2150)  losartan (COZAAR) tablet 25 mg (25 mg Oral Given 05/29/17 2152)  metoprolol tartrate (LOPRESSOR) tablet 25 mg (25 mg Oral Given 05/29/17 2150)  omega-3 acid ethyl esters (LOVAZA) capsule 1 g  (1 g Oral Not Given 05/29/17 2158)  Milnacipran (SAVELLA) tablet TABS 100 mg (100 mg Oral Not Given 05/29/17 2157)  rosuvastatin (CRESTOR) tablet 20 mg (20 mg Oral Not Given 05/29/17 2158)  rOPINIRole (REQUIP) tablet 1 mg (not administered)  tiZANidine (ZANAFLEX) tablet 4 mg (not administered)  isosorbide mononitrate (IMDUR) 24 hr tablet 60 mg (60 mg Oral  Given 05/29/17 2150)  fluticasone (FLONASE) 50 MCG/ACT nasal spray 2 spray (not administered)  enoxaparin (LOVENOX) injection 40 mg (40 mg Subcutaneous Given 05/29/17 2008)  ondansetron (ZOFRAN) injection 4 mg (4 mg Intravenous Given 05/29/17 1832)  acetaminophen (TYLENOL) tablet 650 mg (not administered)    Or  acetaminophen (TYLENOL) suppository 650 mg (not administered)  ondansetron (ZOFRAN) injection 4 mg (4 mg Intravenous Given 05/29/17 2150)  morphine 4 MG/ML injection 2 mg (2 mg Intravenous Given 05/29/17 1832)  0.9 %  sodium chloride infusion ( Intravenous New Bag/Given 05/29/17 1813)  rOPINIRole (REQUIP) tablet 2 mg (2 mg Oral Not Given 05/29/17 2158)  ondansetron (ZOFRAN) injection 4 mg (4 mg Intravenous Given 05/29/17 1155)  ondansetron (ZOFRAN) injection 4 mg (4 mg Intravenous Given 05/29/17 1308)  sodium chloride 0.9 % bolus 1,000 mL (0 mLs Intravenous Stopped 05/29/17 1403)  labetalol (NORMODYNE,TRANDATE) injection 10 mg (10 mg Intravenous Given 05/29/17 1309)  fentaNYL (SUBLIMAZE) injection 50 mcg (50 mcg Intravenous Given 05/29/17 1343)  iopamidol (ISOVUE-300) 61 % injection (100 mLs  Contrast Given 05/29/17 1502)  labetalol (NORMODYNE,TRANDATE) injection 10 mg (10 mg Intravenous Given 05/29/17 1427)  LORazepam (ATIVAN) injection 1 mg (1 mg Intravenous Given 05/29/17 1656)  labetalol (NORMODYNE,TRANDATE) injection 10 mg (10 mg Intravenous Given 05/29/17 1656)     Initial Impression / Assessment and Plan / ED Course  I have reviewed the triage vital signs and the nursing notes.  Pertinent labs & imaging results that  were available during my care of the patient were reviewed by me and considered in my medical decision making (see chart for details).     LYNNLEIGH SODEN is a 71 y.o. female with a past medical history significant for sigmoid cancer status post partial colectomy, irritable bowel syndrome, hypertension, hyperlipidemia, GERD, prior pancreatitis, prior cholecystectomy, and gastroparesis who presents for decreased oral intake, nausea, vomiting, abdominal pain, constipation, headache, and elevated blood pressure.  Patient reports that since Christmas, patient has had abdominal pain.  She describes it as cramping and aching in her upper abdomen.  She describes it as moderate to severe.  She reports nausea and vomiting with anything she has eaten.  She has not been able to tolerate any of her medications or fluids or food since onset 4 days ago.  She says that she has not had a bowel movement since then however, she occasionally does not have bowel movement for several days.  She thinks she is still passing gas.  Patient denies chest pain or shortness of breath but does report some fatigue and lightheadedness in the setting of the likely dehydration.  She reports her urine has decreased in quantity but there is no dysuria.  She denies any severe back pain or neck pain.  She does report a severe headache.  On arrival, patient found to have a blood pressure in the 829F systolic.  On exam, patient had no focal neurologic deficits.  Abdomen is diffusely tender to palpation.  Lungs are clear.  Patient had symmetric pupils with normal extraocular movements.  Patient's legs were nontender and were not edematous.  I am concerned about the patient having gastroparesis leading to missing medications worsened nausea vomiting abdominal pain.  However, patient has history of multiple abdominal surgeries and colon cancer so in the setting of nausea, vomiting, severe abdominal pain she will need imaging of her abdomen.  Patient  will have agnostic laboratory testing to look for occult infection or dehydration with electrode abnormalities.  Patient will be given  labetalol for hypertensive emergency possibility and will also receive imaging of the head.  Patient reports of her has helped her and she will continue this.  Next  Anticipate reassessment after diagnostic testing.  4:36 PM Patient's blood pressure responded to the labetalol but increased again.  Patient reports that her headache worsened with the elevated blood pressure.  Patient's diagnostic laboratory testing seen above.  Clinically, patient appears dehydrated with slightly elevated lactic acid, elevated hemoglobin, and leukocytosis.  Patient denied any urinary symptoms, do not feel the urinalysis reflects UTI.  No nitrites present.  Patient's laboratory testing otherwise showed reassuring liver function, normal lipase, and troponin is negative.  CT scan of the abdomen showed no evidence of obstruction or other abnormality causing her symptoms.  CT head reassuring.  Chest x-ray shows no pneumonia.    I suspect patient's dehydration is from the gastroparesis flare which she says she has yearly leading to dehydration and not taking her blood pressure medicines.  I think the patient is having hypertensive emergency causing her headache and symptoms to worsen.  Given her continued nausea and inability to take her medicines orally or stay hydrated, patient will need admission for rehydration and blood pressure management.  Hospitalist team will be called for admission.   Final Clinical Impressions(s) / ED Diagnoses   Final diagnoses:  Nausea and vomiting, intractability of vomiting not specified, unspecified vomiting type  Generalized abdominal pain  Acute nonintractable headache, unspecified headache type  Hypertensive emergency    ED Discharge Orders    None      Clinical Impression: 1. Nausea and vomiting, intractability of vomiting not specified,  unspecified vomiting type   2. Generalized abdominal pain   3. Acute nonintractable headache, unspecified headache type   4. Hypertensive emergency     Disposition: Admit  This note was prepared with assistance of Dragon voice recognition software. Occasional wrong-word or sound-a-like substitutions may have occurred due to the inherent limitations of voice recognition software.     Jamyla Ard, Gwenyth Allegra, MD 05/29/17 [phone]

## 2017-05-29 NOTE — Progress Notes (Signed)
Patient arrived to 6n32, alert and oriented mild pain to abdomen and nausea, will medicate per orders. BP elevated, will medicate and recheck. IV intact. Family at bedside, oriented to room and staff, will continue to monitor.

## 2017-05-30 ENCOUNTER — Other Ambulatory Visit: Payer: Self-pay

## 2017-05-30 DIAGNOSIS — Z85038 Personal history of other malignant neoplasm of large intestine: Secondary | ICD-10-CM | POA: Diagnosis not present

## 2017-05-30 DIAGNOSIS — Z79899 Other long term (current) drug therapy: Secondary | ICD-10-CM | POA: Diagnosis not present

## 2017-05-30 DIAGNOSIS — F419 Anxiety disorder, unspecified: Secondary | ICD-10-CM | POA: Diagnosis present

## 2017-05-30 DIAGNOSIS — I361 Nonrheumatic tricuspid (valve) insufficiency: Secondary | ICD-10-CM | POA: Diagnosis not present

## 2017-05-30 DIAGNOSIS — K5900 Constipation, unspecified: Secondary | ICD-10-CM | POA: Diagnosis not present

## 2017-05-30 DIAGNOSIS — K589 Irritable bowel syndrome without diarrhea: Secondary | ICD-10-CM | POA: Diagnosis present

## 2017-05-30 DIAGNOSIS — R112 Nausea with vomiting, unspecified: Secondary | ICD-10-CM | POA: Diagnosis present

## 2017-05-30 DIAGNOSIS — Z888 Allergy status to other drugs, medicaments and biological substances status: Secondary | ICD-10-CM | POA: Diagnosis not present

## 2017-05-30 DIAGNOSIS — E860 Dehydration: Secondary | ICD-10-CM | POA: Diagnosis not present

## 2017-05-30 DIAGNOSIS — R7989 Other specified abnormal findings of blood chemistry: Secondary | ICD-10-CM | POA: Diagnosis not present

## 2017-05-30 DIAGNOSIS — I2510 Atherosclerotic heart disease of native coronary artery without angina pectoris: Secondary | ICD-10-CM | POA: Diagnosis present

## 2017-05-30 DIAGNOSIS — Z7951 Long term (current) use of inhaled steroids: Secondary | ICD-10-CM | POA: Diagnosis not present

## 2017-05-30 DIAGNOSIS — R51 Headache: Secondary | ICD-10-CM | POA: Diagnosis not present

## 2017-05-30 DIAGNOSIS — K219 Gastro-esophageal reflux disease without esophagitis: Secondary | ICD-10-CM | POA: Diagnosis present

## 2017-05-30 DIAGNOSIS — Z9114 Patient's other noncompliance with medication regimen: Secondary | ICD-10-CM | POA: Diagnosis not present

## 2017-05-30 DIAGNOSIS — Z9049 Acquired absence of other specified parts of digestive tract: Secondary | ICD-10-CM | POA: Diagnosis not present

## 2017-05-30 DIAGNOSIS — I10 Essential (primary) hypertension: Secondary | ICD-10-CM | POA: Diagnosis not present

## 2017-05-30 DIAGNOSIS — E872 Acidosis: Secondary | ICD-10-CM | POA: Diagnosis present

## 2017-05-30 DIAGNOSIS — Z8249 Family history of ischemic heart disease and other diseases of the circulatory system: Secondary | ICD-10-CM | POA: Diagnosis not present

## 2017-05-30 DIAGNOSIS — R1084 Generalized abdominal pain: Secondary | ICD-10-CM | POA: Diagnosis not present

## 2017-05-30 DIAGNOSIS — M797 Fibromyalgia: Secondary | ICD-10-CM | POA: Diagnosis not present

## 2017-05-30 DIAGNOSIS — I161 Hypertensive emergency: Secondary | ICD-10-CM | POA: Diagnosis not present

## 2017-05-30 DIAGNOSIS — Z8614 Personal history of Methicillin resistant Staphylococcus aureus infection: Secondary | ICD-10-CM | POA: Diagnosis not present

## 2017-05-30 DIAGNOSIS — E785 Hyperlipidemia, unspecified: Secondary | ICD-10-CM | POA: Diagnosis not present

## 2017-05-30 DIAGNOSIS — E876 Hypokalemia: Secondary | ICD-10-CM | POA: Diagnosis not present

## 2017-05-30 DIAGNOSIS — F329 Major depressive disorder, single episode, unspecified: Secondary | ICD-10-CM | POA: Diagnosis present

## 2017-05-30 DIAGNOSIS — K3184 Gastroparesis: Secondary | ICD-10-CM | POA: Diagnosis present

## 2017-05-30 DIAGNOSIS — E782 Mixed hyperlipidemia: Secondary | ICD-10-CM | POA: Diagnosis not present

## 2017-05-30 DIAGNOSIS — G2581 Restless legs syndrome: Secondary | ICD-10-CM | POA: Diagnosis present

## 2017-05-30 DIAGNOSIS — Z7982 Long term (current) use of aspirin: Secondary | ICD-10-CM | POA: Diagnosis not present

## 2017-05-30 LAB — HEMOGLOBIN A1C
Hgb A1c MFr Bld: 5.7 % — ABNORMAL HIGH (ref 4.8–5.6)
MEAN PLASMA GLUCOSE: 116.89 mg/dL

## 2017-05-30 LAB — BASIC METABOLIC PANEL
ANION GAP: 12 (ref 5–15)
BUN: 13 mg/dL (ref 6–20)
CHLORIDE: 100 mmol/L — AB (ref 101–111)
CO2: 25 mmol/L (ref 22–32)
Calcium: 8.9 mg/dL (ref 8.9–10.3)
Creatinine, Ser: 0.84 mg/dL (ref 0.44–1.00)
Glucose, Bld: 130 mg/dL — ABNORMAL HIGH (ref 65–99)
POTASSIUM: 3.5 mmol/L (ref 3.5–5.1)
SODIUM: 137 mmol/L (ref 135–145)

## 2017-05-30 LAB — URINE CULTURE

## 2017-05-30 LAB — CBC
HEMATOCRIT: 42.3 % (ref 36.0–46.0)
HEMOGLOBIN: 13.8 g/dL (ref 12.0–15.0)
MCH: 28.4 pg (ref 26.0–34.0)
MCHC: 32.6 g/dL (ref 30.0–36.0)
MCV: 87 fL (ref 78.0–100.0)
Platelets: 312 10*3/uL (ref 150–400)
RBC: 4.86 MIL/uL (ref 3.87–5.11)
RDW: 14.4 % (ref 11.5–15.5)
WBC: 10.8 10*3/uL — AB (ref 4.0–10.5)

## 2017-05-30 MED ORDER — SENNOSIDES-DOCUSATE SODIUM 8.6-50 MG PO TABS
1.0000 | ORAL_TABLET | Freq: Two times a day (BID) | ORAL | Status: DC
  Administered 2017-05-30 – 2017-05-31 (×2): 1 via ORAL
  Filled 2017-05-30 (×4): qty 1

## 2017-05-30 MED ORDER — BISACODYL 10 MG RE SUPP
10.0000 mg | Freq: Every day | RECTAL | Status: DC | PRN
  Administered 2017-05-30: 10 mg via RECTAL
  Filled 2017-05-30: qty 1

## 2017-05-30 MED ORDER — POLYETHYLENE GLYCOL 3350 17 G PO PACK
17.0000 g | PACK | Freq: Two times a day (BID) | ORAL | Status: DC
  Filled 2017-05-30 (×4): qty 1

## 2017-05-30 MED ORDER — HYDRALAZINE HCL 20 MG/ML IJ SOLN
10.0000 mg | Freq: Once | INTRAMUSCULAR | Status: AC
  Administered 2017-05-30: 10 mg via INTRAVENOUS
  Filled 2017-05-30: qty 1

## 2017-05-30 NOTE — Progress Notes (Signed)
PROGRESS NOTE    Angela [name]  HUT:[id number] DOB: 1945-10-28 DOA: 05/29/2017 PCP: [name], MD   Brief Narrative:  Angela [name] is a 71 y.o. female with a medical history of fibromyalgia, hypertension, hyperlipidemia, who presents emergency department with complaints of nausea, vomiting, abdominal pain that has been ongoing since Christmas Day. Patient states she's been unable to tolerate any of her oral medications. She's been trying sublingual Zofran with resolution of her symptoms. Patient also complained of abdominal soreness. She has also had headache.  Patient states this occurred before. She has followed up with Dr. Penelope Coop, gastroenterologist. It has been about a year. She states she had some type of emptying study which did show delay. Continues to Have Intractable N/V and Abdominal Pain.   Assessment & Plan:   Active Problems:   Hyperlipidemia   Dehydration   Nausea & vomiting   Fibromyalgia   Nausea and vomiting  Nausea, vomiting, abdominal pain -Possibly secondary to questionable gastroparesis (no history of diabetes) and HbA1c was 5.7 -Patient has tried taking sublingual Zofran without resolution of her symptoms. Cannot tolerate Reglan or Phenergan given Hx -CT abdomen showed no acute interval abdominal process. No bowel obstruction. -UA unremarkable for infection -LFTs and lipase within normal limits -Placed on IV fluids at 75 mL/hr, scheduled as well as as needed IV Zofran (will monitor QTc) q8h and q6prn,  -Clear liquid diet if possible -May consider Erythromycin if not improved. -Has seen Dr. Penelope Coop in the past; May consult [facility] if not significantly improving  -May need NGT.   Accelerated Hypertension -Suspect secondary to patient's inability to tolerate oral medications -Will restart patient's home medications of Imdur, Cozaar, metoprolol -Will place on IV hydralazine as necessary   Elevated Lactic Acid/Lactic Acidosis -Likely secondary to the above  along with dehydration -Placed on IV fluids and continue to monitor  Headache -Suspect secondary to the above, dehydration and heart hypertension -CT head unremarkable -Placed on morphine as needed  Fibromyalgia/Restless leg syndrome -Continue Savella, Zanaflex, Requip  Hyperlipidemia -Continue Lovaza, Statin  Depression/anxiety -Continue Lexapro, Ativan  Dehydration -secondary to the above -Patient with hemoconcentration, hemoglobin 16.3  Constipation -Started Miralax BID, Senna-Docusate BID, and Bisacodyl Suppository PRN -Takes Linzess at home but not on MAR. Will need to verify with Pharmacy   Leukocytosis -Likely from N/V/Abd Pain -Improving -Continue to Monitor for S/Sx of infection and Repeat CBC in AM  DVT prophylaxis: Enoxaparin 40 mg sq q24h Code Status: FULL CODE Family Communication: Discussed with Daughter at bedside Disposition Plan: Remain Inpatient until Medically Stable for D/C  Consultants:   None   Procedures: None  Antimicrobials:  Anti-infectives (From admission, onward)   None     Subjective: Seen and examined and was still feeling nauseous and stated abdomen was hurting. No CP or SOB.    Objective: Vitals:   05/30/17 0406 05/30/17 0453 05/30/17 0500 05/30/17 1422  BP: (!) 174/83 (!) 153/69  (!) 165/80  Pulse: 88   87  Resp: 17   17  Temp: 99.3 F (37.4 C)   98.9 F (37.2 C)  TempSrc: Oral   Oral  SpO2: 98%   97%  Weight:   73.9 kg (162 lb 14.7 oz)   Height:        Intake/Output Summary (Last 24 hours) at 05/30/2017 2055 Last data filed at 05/30/2017 1700 Gross per 24 hour  Intake 2443.75 ml  Output -  Net 2443.75 ml   Filed Weights   05/29/17 6568 05/29/17  1812 05/30/17 0500  Weight: 71.2 kg (157 lb) 73.7 kg (162 lb 7.7 oz) 73.9 kg (162 lb 14.7 oz)   Examination: Physical Exam:  Constitutional: WN/WD Caucasian female in NAD and appears calm and comfortable Eyes: Lids and conjunctivae normal, sclerae anicteric    ENMT: External Ears, Nose appear normal. Grossly normal hearing. Mucous membranes are slightly dry.  Neck: Appears normal, supple, no cervical masses, normal ROM, no appreciable thyromegaly, no JVD Respiratory: Clear to auscultation bilaterally, no wheezing, rales, rhonchi or crackles. Normal respiratory effort and patient is not tachypenic. No accessory muscle use.  Cardiovascular: RRR, no murmurs / rubs / gallops. S1 and S2 auscultated. No extremity edema. 2+ pedal pulses. No carotid bruits.  Abdomen: Soft, Tender to palpate, non-distended. No masses palpated. No appreciable hepatosplenomegaly. Bowel sounds positive x4.  GU: Deferred. Musculoskeletal: No clubbing / cyanosis of digits/nails. No joint deformity upper and lower extremities.  Skin: No rashes, lesions, ulcers on a limited skin eval. No induration; Warm and dry.  Neurologic: CN 2-12 grossly intact with no focal deficits.Romberg sign cerebellar reflexes not assessed.  Psychiatric: Normal judgment and insight. Alert and oriented x 3. Normal mood and appropriate affect.   Data Reviewed: I have personally reviewed following labs and imaging studies  CBC: Recent Labs  Lab 05/29/17 1001 05/30/17 0610  WBC 12.1* 10.8*  HGB 16.3* 13.8  HCT 48.3* 42.3  MCV 86.1 87.0  PLT 349 725   Basic Metabolic Panel: Recent Labs  Lab 05/29/17 1001 05/30/17 0610  NA 140 137  K 4.2 3.5  CL 99* 100*  CO2 27 25  GLUCOSE 186* 130*  BUN 19 13  CREATININE 1.07* 0.84  CALCIUM 9.9 8.9   GFR: Estimated Creatinine Clearance: 64 mL/min (by C-G formula based on SCr of 0.84 mg/dL). Liver Function Tests: Recent Labs  Lab 05/29/17 1001  AST 26  ALT 16  ALKPHOS 104  BILITOT 0.9  PROT 8.2*  ALBUMIN 4.6   Recent Labs  Lab 05/29/17 1001  LIPASE 26   No results for input(s): AMMONIA in the last 168 hours. Coagulation Profile: Recent Labs  Lab 05/29/17 1316  INR 1.02   Cardiac Enzymes: No results for input(s): CKTOTAL, CKMB,  CKMBINDEX, TROPONINI in the last 168 hours. BNP (last 3 results) No results for input(s): PROBNP in the last 8760 hours. HbA1C: Recent Labs    05/30/17 1000  HGBA1C 5.7*   CBG: No results for input(s): GLUCAP in the last 168 hours. Lipid Profile: No results for input(s): CHOL, HDL, LDLCALC, TRIG, CHOLHDL, LDLDIRECT in the last 72 hours. Thyroid Function Tests: No results for input(s): TSH, T4TOTAL, FREET4, T3FREE, THYROIDAB in the last 72 hours. Anemia Panel: No results for input(s): VITAMINB12, FOLATE, FERRITIN, TIBC, IRON, RETICCTPCT in the last 72 hours. Sepsis Labs: Recent Labs  Lab 05/29/17 1337 05/29/17 1538  LATICACIDVEN 2.63* 2.11*    Recent Results (from the past 240 hour(s))  Urine culture     Status: Abnormal   Collection Time: 05/29/17  2:16 PM  Result Value Ref Range Status   Specimen Description URINE, CLEAN CATCH  Final   Special Requests NONE  Final   Culture MULTIPLE SPECIES PRESENT, SUGGEST RECOLLECTION (A)  Final   Report Status 05/30/2017 FINAL  Final    Radiology Studies: Dg Chest 2 View  Result Date: 05/29/2017 CLINICAL DATA:  Nausea and vomiting EXAM: CHEST  2 VIEW COMPARISON:  09/30/2013, 02/05/2013 FINDINGS: Postsurgical changes in the cervical spine. No consolidation or pleural effusion. Normal cardiomediastinal  silhouette. No pneumothorax. Mild degenerative changes of the spine IMPRESSION: No active cardiopulmonary disease. Electronically Signed   By: Donavan Foil M.D.   On: 05/29/2017 14:47   Ct Head Wo Contrast  Result Date: 05/29/2017 CLINICAL DATA:  Headache and hypertension. EXAM: CT HEAD WITHOUT CONTRAST TECHNIQUE: Contiguous axial images were obtained from the base of the skull through the vertex without intravenous contrast. COMPARISON:  None. FINDINGS: Brain: No evidence of acute infarction, hemorrhage, hydrocephalus, extra-axial collection or mass lesion/mass effect. Age-related cerebral atrophy with compensatory dilatation of the  ventricles. Vascular: No hyperdense vessel or unexpected calcification. Skull: Normal. Negative for fracture or focal lesion. Sinuses/Orbits: No acute finding. Other: None. IMPRESSION: 1. Normal for age noncontrast head CT. Electronically Signed   By: Titus Dubin M.D.   On: 05/29/2017 15:12   Ct Abdomen Pelvis W Contrast  Result Date: 05/29/2017 CLINICAL DATA:  Nausea and vomiting with abdominal pain. Prior partial colectomy for colon cancer. No bowel movement for the past 4 days. EXAM: CT ABDOMEN AND PELVIS WITH CONTRAST TECHNIQUE: Multidetector CT imaging of the abdomen and pelvis was performed using the standard protocol following bolus administration of intravenous contrast. CONTRAST:  160mL ISOVUE-300 IOPAMIDOL (ISOVUE-300) INJECTION 61% COMPARISON:  CT abdomen pelvis dated May 16, 2016. FINDINGS: Lower chest: No acute abnormality. Stable scarring and subsegmental atelectasis in the right lower lobe. Unchanged triangular, subpleural nodule in the left lower lobe, likely a lymph node. Hepatobiliary: Unchanged hemangioma in hepatic dome, stable since 2014. Unchanged focal fat along the falciform ligament. Prior cholecystectomy. Stable mild extrahepatic biliary dilatation. Pancreas: Unremarkable. No pancreatic ductal dilatation or surrounding inflammatory changes. Spleen: Normal in size without focal abnormality. Adrenals/Urinary Tract: The adrenal glands are unremarkable. Stable bilateral renal cysts. No renal or ureteral calculi. No hydronephrosis. The bladder is unremarkable. Stomach/Bowel: Stable small hiatal hernia. Postsurgical changes from prior partial colectomy again noted. No bowel obstruction, wall thickening, or surrounding inflammatory changes. Vascular/Lymphatic: Mild aortic atherosclerosis. No enlarged abdominal or pelvic lymph nodes. Reproductive: Status post hysterectomy. No adnexal masses. Other: No free fluid or pneumoperitoneum. Musculoskeletal: No acute or significant osseous  findings. Degenerative changes of the thoracolumbar spine. IMPRESSION: 1. No acute intra-abdominal process. Prior partial colectomy. No bowel obstruction. 2.  Aortic atherosclerosis (ICD10-I70.0). Electronically Signed   By: Titus Dubin M.D.   On: 05/29/2017 15:35   Scheduled Meds: . aspirin EC  81 mg Oral Daily  . enoxaparin (LOVENOX) injection  40 mg Subcutaneous Q24H  . escitalopram  5 mg Oral Daily  . isosorbide mononitrate  60 mg Oral Daily  . LORazepam  2 mg Oral QHS  . losartan  25 mg Oral Daily  . metoprolol tartrate  25 mg Oral BID  . Milnacipran  100 mg Oral BID  . omega-3 acid ethyl esters  1 g Oral BID  . ondansetron (ZOFRAN) IV  4 mg Intravenous Q8H  . polyethylene glycol  17 g Oral BID  . rOPINIRole  1 mg Oral Daily  . rOPINIRole  2 mg Oral QHS  . rosuvastatin  20 mg Oral q1800  . senna-docusate  1 tablet Oral BID   Continuous Infusions: . sodium chloride 75 mL/hr at 05/30/17 2002     LOS: 0 days   Kerney Elbe, DO [facility] Pager [phone]  If 7PM-7AM, please contact night-coverage [email or web address] Password [facility] 05/30/2017, 8:55 PM

## 2017-05-31 DIAGNOSIS — R51 Headache: Secondary | ICD-10-CM

## 2017-05-31 DIAGNOSIS — I10 Essential (primary) hypertension: Secondary | ICD-10-CM

## 2017-05-31 DIAGNOSIS — K5900 Constipation, unspecified: Secondary | ICD-10-CM

## 2017-05-31 DIAGNOSIS — R519 Headache, unspecified: Secondary | ICD-10-CM

## 2017-05-31 DIAGNOSIS — E876 Hypokalemia: Secondary | ICD-10-CM

## 2017-05-31 DIAGNOSIS — R7989 Other specified abnormal findings of blood chemistry: Secondary | ICD-10-CM

## 2017-05-31 LAB — COMPREHENSIVE METABOLIC PANEL
ALBUMIN: 3.7 g/dL (ref 3.5–5.0)
ALT: 11 U/L — ABNORMAL LOW (ref 14–54)
ANION GAP: 10 (ref 5–15)
AST: 15 U/L (ref 15–41)
Alkaline Phosphatase: 91 U/L (ref 38–126)
BUN: 10 mg/dL (ref 6–20)
CO2: 25 mmol/L (ref 22–32)
Calcium: 9 mg/dL (ref 8.9–10.3)
Chloride: 101 mmol/L (ref 101–111)
Creatinine, Ser: 0.75 mg/dL (ref 0.44–1.00)
GFR calc Af Amer: >60 mL/min (ref >60)
GFR calc non Af Amer: >60 mL/min (ref >60)
GLUCOSE: 117 mg/dL — AB (ref 65–99)
POTASSIUM: 3.1 mmol/L — AB (ref 3.5–5.1)
SODIUM: 136 mmol/L (ref 135–145)
TOTAL PROTEIN: 6.8 g/dL (ref 6.5–8.1)
Total Bilirubin: 1 mg/dL (ref 0.3–1.2)

## 2017-05-31 LAB — MAGNESIUM: Magnesium: 1.7 mg/dL (ref 1.7–2.4)

## 2017-05-31 LAB — CBC WITH DIFFERENTIAL/PLATELET
BASOS PCT: 0 %
Basophils Absolute: 0 10*3/uL (ref 0.0–0.1)
EOS ABS: 0 10*3/uL (ref 0.0–0.7)
Eosinophils Relative: 0 %
HCT: 42.8 % (ref 36.0–46.0)
Hemoglobin: 14.2 g/dL (ref 12.0–15.0)
Lymphocytes Relative: 34 %
Lymphs Abs: 3.2 10*3/uL (ref 0.7–4.0)
MCH: 28.6 pg (ref 26.0–34.0)
MCHC: 33.2 g/dL (ref 30.0–36.0)
MCV: 86.3 fL (ref 78.0–100.0)
MONO ABS: 0.8 10*3/uL (ref 0.1–1.0)
MONOS PCT: 8 %
Neutro Abs: 5.5 10*3/uL (ref 1.7–7.7)
Neutrophils Relative %: 58 %
Platelets: 287 10*3/uL (ref 150–400)
RBC: 4.96 MIL/uL (ref 3.87–5.11)
RDW: 14.2 % (ref 11.5–15.5)
WBC: 9.4 10*3/uL (ref 4.0–10.5)

## 2017-05-31 LAB — LACTIC ACID, PLASMA
LACTIC ACID, VENOUS: 2.8 mmol/L — AB (ref 0.5–1.9)
LACTIC ACID, VENOUS: 2.5 mmol/L — AB (ref 0.5–1.9)

## 2017-05-31 LAB — PHOSPHORUS: Phosphorus: 3.2 mg/dL (ref 2.5–4.6)

## 2017-05-31 MED ORDER — POTASSIUM CHLORIDE CRYS ER 20 MEQ PO TBCR
40.0000 meq | EXTENDED_RELEASE_TABLET | Freq: Two times a day (BID) | ORAL | Status: AC
  Administered 2017-05-31 (×2): 40 meq via ORAL
  Filled 2017-05-31 (×2): qty 2

## 2017-05-31 MED ORDER — HYDRALAZINE HCL 20 MG/ML IJ SOLN
10.0000 mg | INTRAMUSCULAR | Status: DC | PRN
  Administered 2017-06-01 (×2): 10 mg via INTRAVENOUS
  Filled 2017-05-31 (×2): qty 1

## 2017-05-31 MED ORDER — HYDRALAZINE HCL 20 MG/ML IJ SOLN
10.0000 mg | INTRAMUSCULAR | Status: DC | PRN
  Administered 2017-05-31: 10 mg via INTRAVENOUS
  Filled 2017-05-31: qty 1

## 2017-05-31 MED ORDER — SODIUM CHLORIDE 0.9 % IV BOLUS (SEPSIS)
1000.0000 mL | Freq: Once | INTRAVENOUS | Status: AC
  Administered 2017-05-31: 1000 mL via INTRAVENOUS

## 2017-05-31 MED ORDER — POTASSIUM CHLORIDE 10 MEQ/100ML IV SOLN
10.0000 meq | INTRAVENOUS | Status: AC
  Administered 2017-05-31 (×3): 10 meq via INTRAVENOUS
  Filled 2017-05-31 (×3): qty 100

## 2017-05-31 NOTE — Progress Notes (Signed)
CRITICAL VALUE ALERT  Critical Value: Lactic Acid level 2.8  Date & Time Notied: 05/31/17  10:35  Provider Notified: Dr. Alfredia Ferguson  Orders Received/Actions taken :Bolus of fluids given

## 2017-05-31 NOTE — Progress Notes (Signed)
PROGRESS NOTE    Angela [name]  DTO:[id number] DOB: [birth year] DOA: 05/29/2017 PCP: [name], MD   Brief Narrative:  Angela [name] is a 71 y.o. female with a medical history of fibromyalgia, hypertension, hyperlipidemia, who presents emergency department with complaints of nausea, vomiting, abdominal pain that has been ongoing since Christmas Day. Patient states she's been unable to tolerate any of her oral medications. She's been trying sublingual Zofran with resolution of her symptoms. Patient also complained of abdominal soreness. She has also had headache.  Patient states this occurred before. She has followed up with Dr. Penelope Coop, gastroenterologist. It has been about a year. She states she had some type of emptying study which did show delay. Continued to Have Intractable N/V and Abdominal Pain but felt a little better today and had a bowel movement overnight. Diet was advanced to Full Liquid Diet.  Assessment & Plan:   Active Problems:   Hyperlipidemia   Dehydration   Fibromyalgia   Nausea and vomiting   Accelerated hypertension   Elevated lactic acid level   Headache   Constipation  Intractable Nausea, Vomiting, Abdominal Pain, improving  -Possibly secondary to questionable gastroparesis (no history of diabetes) and HbA1c was 5.7 -Patient has tried taking sublingual Zofran without resolution of her symptoms. Cannot tolerate Reglan or Phenergan given her Hx -CT abdomen showed no acute interval abdominal process. No bowel obstruction. -UA unremarkable for infection -LFTs and lipase within normal limits -Placed on IV fluids at 75 mL/hr and increased to 100 mL/hr, scheduled as well as as needed IV Zofran (will monitor QTc) q8h and q6prn,  -Clear Liquid Diet advanced to Full Liquid Diet; If she tolerates Full Liquid Diet can increase to Soft Diet -Considering Erythromycin if still not improving -Has seen Dr. Penelope Coop in the past; May consult [facility] if not significantly improving    -Will need outpatient follow up with Gastroenterology and PCP at D/C  Accelerated Hypertension -Suspect secondary to patient's inability to tolerate oral medications -Restarted patient's home medications of Imdur 60  Mg po Daily, Losartan 25 mg po Daily, Metoprolol -Placed on   Elevated Lactic Acid/Lactic Acidosis -Lactic Acid Level went from 2.63 -> 2.11 -> 2.8 -> 2.5 -Likely secondary to the above along with dehydration -Given IVF Bolus today -Placed on IV fluids and increased to 100 mL/hr and continue to monitor  Headache, improved -Suspect secondary to the above, dehydration and heart hypertension -CT head unremarkable -Placed on Morphine as needed  Fibromyalgia/Restless Leg Syndrome -Continue Milnacipran 100 mg po BID, Tizanidine 4 mg po q8hprn Spasms, Ropinirole 1 mg po Daily and 2 mg po qHS  Hyperlipidemia -Continue Rosuvastatin 20 mg po qHS and Omega-3 Acid Ethyl Esters 1 gram po BID  Depression/Anxiety -Continue Escitalopram 5 mg po Daily and Lorazepam 2 mg po Daily qHS  Dehydration -Secondary to the above -Patient with hemoconcentration on admission, hemoglobin 16.3 -Increased IVF from 75 mL/hr to 100 mL/hr   Constipation, improved -Started Miralax BID, Senna-Docusate BID, and Bisacodyl Suppository PRN -Takes Movantik at home but not on MAR. Will need to verify with Pharmacy dose as patient does not remember  Leukocytosis -Likely from N/V/Abd Pain -Improving as WBC went from 12.1 -> 10.8 -> 9.4 -Continue to Monitor for S/Sx of infection and Repeat CBC in AM  Hypokalemia -Patient's K+ was 3.1 -Replete with po KCl 40 mEQ BID x 2 doses and IV 30 mEQ -Continue to Monitor and Replete as Necessary -Repeat CMP in AM  DVT prophylaxis: Enoxaparin 40  mg sq q24h Code Status: FULL CODE Family Communication: Discussed with Husband at bedside Disposition Plan: Remain Inpatient until Medically Stable for D/C  Consultants:   None   Procedures:  None  Antimicrobials:  Anti-infectives (From admission, onward)   None     Subjective: Seen and examined at bedside and stated she felt a little better. Abdomen was sore but not hurting as much. Nausea is improving and wanted to see if she could advance diet. No CP or SOB. Was worried about her BP.   Objective: Vitals:   05/31/17 0012 05/31/17 0110 05/31/17 0510 05/31/17 1445  BP: (!) 183/92 (!) 183/92 (!) 170/88 (!) 110/54  Pulse:   (!) 107 79  Resp:   18 18  Temp:   98.7 F (37.1 C) 98.7 F (37.1 C)  TempSrc:   Oral Oral  SpO2:   96% 100%  Weight:      Height:        Intake/Output Summary (Last 24 hours) at 05/31/2017 1818 Last data filed at 05/31/2017 1800 Gross per 24 hour  Intake 4231.25 ml  Output -  Net 4231.25 ml   Filed Weights   05/29/17 0952 05/29/17 1812 05/30/17 0500  Weight: 71.2 kg (157 lb) 73.7 kg (162 lb 7.7 oz) 73.9 kg (162 lb 14.7 oz)   Examination: Physical Exam:  Constitutional: WN/WD Caucasian female in NAD appears calm Eyes: Sclerae anicteric. Lids normal ENMT: External Ears and nose appear normal Neck: Appears supple with no JVD Respiratory: CTAB; No wheezing/rales/rhonchi. Unlabored breathing  Cardiovascular: RRR; no m/r/g. No appreciable Extremity edema Abdomen: Soft, Tender to palpate Abdomen. ND. Bowel sounds present GU: Deferred Musculoskeletal: No contractures; No cyanosis Skin: Warm and Dry. No rashes or lesions on a limited skin evaluation Neurologic: CN 2-12 grossly intact. No appreciable focal deficits Psychiatric: Normal mood and affect. Intact judgement and insight  Data Reviewed: I have personally reviewed following labs and imaging studies  CBC: Recent Labs  Lab 05/29/17 1001 05/30/17 0610 05/31/17 0408  WBC 12.1* 10.8* 9.4  NEUTROABS  --   --  5.5  HGB 16.3* 13.8 14.2  HCT 48.3* 42.3 42.8  MCV 86.1 87.0 86.3  PLT 349 312 790   Basic Metabolic Panel: Recent Labs  Lab 05/29/17 1001 05/30/17 0610 05/31/17 0408   NA 140 137 136  K 4.2 3.5 3.1*  CL 99* 100* 101  CO2 27 25 25   GLUCOSE 186* 130* 117*  BUN 19 13 10   CREATININE 1.07* 0.84 0.75  CALCIUM 9.9 8.9 9.0  MG  --   --  1.7  PHOS  --   --  3.2   GFR: Estimated Creatinine Clearance: 67.2 mL/min (by C-G formula based on SCr of 0.75 mg/dL). Liver Function Tests: Recent Labs  Lab 05/29/17 1001 05/31/17 0408  AST 26 15  ALT 16 11*  ALKPHOS 104 91  BILITOT 0.9 1.0  PROT 8.2* 6.8  ALBUMIN 4.6 3.7   Recent Labs  Lab 05/29/17 1001  LIPASE 26   No results for input(s): AMMONIA in the last 168 hours. Coagulation Profile: Recent Labs  Lab 05/29/17 1316  INR 1.02   Cardiac Enzymes: No results for input(s): CKTOTAL, CKMB, CKMBINDEX, TROPONINI in the last 168 hours. BNP (last 3 results) No results for input(s): PROBNP in the last 8760 hours. HbA1C: Recent Labs    05/30/17 1000  HGBA1C 5.7*   CBG: No results for input(s): GLUCAP in the last 168 hours. Lipid Profile: No results for input(s): CHOL, HDL,  LDLCALC, TRIG, CHOLHDL, LDLDIRECT in the last 72 hours. Thyroid Function Tests: No results for input(s): TSH, T4TOTAL, FREET4, T3FREE, THYROIDAB in the last 72 hours. Anemia Panel: No results for input(s): VITAMINB12, FOLATE, FERRITIN, TIBC, IRON, RETICCTPCT in the last 72 hours. Sepsis Labs: Recent Labs  Lab 05/29/17 1337 05/29/17 1538 05/31/17 0928 05/31/17 1311  LATICACIDVEN 2.63* 2.11* 2.8* 2.5*    Recent Results (from the past 240 hour(s))  Urine culture     Status: Abnormal   Collection Time: 05/29/17  2:16 PM  Result Value Ref Range Status   Specimen Description URINE, CLEAN CATCH  Final   Special Requests NONE  Final   Culture MULTIPLE SPECIES PRESENT, SUGGEST RECOLLECTION (A)  Final   Report Status 05/30/2017 FINAL  Final    Radiology Studies: No results found. Scheduled Meds: . aspirin EC  81 mg Oral Daily  . enoxaparin (LOVENOX) injection  40 mg Subcutaneous Q24H  . escitalopram  5 mg Oral Daily  .  isosorbide mononitrate  60 mg Oral Daily  . LORazepam  2 mg Oral QHS  . losartan  25 mg Oral Daily  . metoprolol tartrate  25 mg Oral BID  . Milnacipran  100 mg Oral BID  . omega-3 acid ethyl esters  1 g Oral BID  . ondansetron (ZOFRAN) IV  4 mg Intravenous Q8H  . polyethylene glycol  17 g Oral BID  . potassium chloride  40 mEq Oral BID  . rOPINIRole  1 mg Oral Daily  . rOPINIRole  2 mg Oral QHS  . rosuvastatin  20 mg Oral q1800  . senna-docusate  1 tablet Oral BID   Continuous Infusions: . sodium chloride 100 mL/hr at 05/31/17 1800    LOS: 1 day   Kerney Elbe, DO [facility] Pager [phone]  If 7PM-7AM, please contact night-coverage [email or web address] Password [email or web address] 05/31/2017, 6:18 PM

## 2017-05-31 NOTE — Progress Notes (Signed)
Notified on call md about patient's BP 183/92 to 184/92, new order received.

## 2017-05-31 NOTE — Progress Notes (Signed)
CRITICAL VALUE ALERT  Critical Value: 2.5 lactic acid  Date & Time Notied:  05/31/17  Provider Notified: Dr. Alfredia Ferguson  Orders Received/Actions taken: awaiting for orders

## 2017-06-01 DIAGNOSIS — R7989 Other specified abnormal findings of blood chemistry: Secondary | ICD-10-CM

## 2017-06-01 DIAGNOSIS — E785 Hyperlipidemia, unspecified: Secondary | ICD-10-CM

## 2017-06-01 DIAGNOSIS — E876 Hypokalemia: Secondary | ICD-10-CM

## 2017-06-01 DIAGNOSIS — I10 Essential (primary) hypertension: Secondary | ICD-10-CM

## 2017-06-01 DIAGNOSIS — R51 Headache: Secondary | ICD-10-CM

## 2017-06-01 DIAGNOSIS — M797 Fibromyalgia: Secondary | ICD-10-CM

## 2017-06-01 DIAGNOSIS — I161 Hypertensive emergency: Secondary | ICD-10-CM

## 2017-06-01 DIAGNOSIS — R1084 Generalized abdominal pain: Secondary | ICD-10-CM

## 2017-06-01 LAB — COMPREHENSIVE METABOLIC PANEL
ALK PHOS: 86 U/L (ref 38–126)
ALT: 12 U/L — AB (ref 14–54)
AST: 15 U/L (ref 15–41)
Albumin: 3.8 g/dL (ref 3.5–5.0)
Anion gap: 8 (ref 5–15)
BILIRUBIN TOTAL: 0.6 mg/dL (ref 0.3–1.2)
BUN: 9 mg/dL (ref 6–20)
CALCIUM: 8.9 mg/dL (ref 8.9–10.3)
CO2: 23 mmol/L (ref 22–32)
CREATININE: 0.85 mg/dL (ref 0.44–1.00)
Chloride: 105 mmol/L (ref 101–111)
GFR calc Af Amer: >60 mL/min (ref >60)
Glucose, Bld: 116 mg/dL — ABNORMAL HIGH (ref 65–99)
Potassium: 4.1 mmol/L (ref 3.5–5.1)
Sodium: 136 mmol/L (ref 135–145)
TOTAL PROTEIN: 6.6 g/dL (ref 6.5–8.1)

## 2017-06-01 LAB — CBC WITH DIFFERENTIAL/PLATELET
BASOS ABS: 0 10*3/uL (ref 0.0–0.1)
Basophils Relative: 0 %
Eosinophils Absolute: 0 10*3/uL (ref 0.0–0.7)
Eosinophils Relative: 0 %
HEMATOCRIT: 41.5 % (ref 36.0–46.0)
Hemoglobin: 14.1 g/dL (ref 12.0–15.0)
LYMPHS ABS: 3.3 10*3/uL (ref 0.7–4.0)
LYMPHS PCT: 43 %
MCH: 29.4 pg (ref 26.0–34.0)
MCHC: 34 g/dL (ref 30.0–36.0)
MCV: 86.5 fL (ref 78.0–100.0)
Monocytes Absolute: 0.6 10*3/uL (ref 0.1–1.0)
Monocytes Relative: 8 %
NEUTROS ABS: 3.7 10*3/uL (ref 1.7–7.7)
Neutrophils Relative %: 49 %
Platelets: 271 10*3/uL (ref 150–400)
RBC: 4.8 MIL/uL (ref 3.87–5.11)
RDW: 14.4 % (ref 11.5–15.5)
WBC: 7.7 10*3/uL (ref 4.0–10.5)

## 2017-06-01 LAB — MAGNESIUM: MAGNESIUM: 1.7 mg/dL (ref 1.7–2.4)

## 2017-06-01 LAB — LACTIC ACID, PLASMA: Lactic Acid, Venous: 0.9 mmol/L (ref 0.5–1.9)

## 2017-06-01 LAB — PHOSPHORUS: Phosphorus: 2.6 mg/dL (ref 2.5–4.6)

## 2017-06-01 MED ORDER — METOPROLOL TARTRATE 25 MG PO TABS
25.0000 mg | ORAL_TABLET | Freq: Once | ORAL | Status: AC
  Administered 2017-06-01: 25 mg via ORAL
  Filled 2017-06-01: qty 1

## 2017-06-01 MED ORDER — METOPROLOL TARTRATE 50 MG PO TABS
50.0000 mg | ORAL_TABLET | Freq: Two times a day (BID) | ORAL | Status: DC
  Administered 2017-06-01 – 2017-06-02 (×2): 50 mg via ORAL
  Filled 2017-06-01 (×2): qty 1

## 2017-06-01 NOTE — Progress Notes (Signed)
Talked with Dr. Lonny Prude about pt pulse, zofran and QT prolongation, low grade temp, BP.

## 2017-06-01 NOTE — Progress Notes (Signed)
EKG completed. Husband at bedside. Pt states decreased pain, nausea.

## 2017-06-01 NOTE — Progress Notes (Signed)
Pulse 120's, regular.  No shortness of breath. Pt states has dry heaves.  BP up.  Zofran, morphine and hydralazine given and will recheck in 1 hour.

## 2017-06-01 NOTE — Progress Notes (Signed)
BP now 158/73 post hydralazine 10 mg IV.  Pulse 120.

## 2017-06-01 NOTE — Evaluation (Signed)
Physical Therapy Evaluation Patient Details Name: Angela [name] MRN: [id number] DOB: [birth year] Today's Date: 06/01/2017   History of Present Illness  71 yo female c/o nausea, vomiting, abdominal pain since Christmas, unable to tolerate oral medications. PMH anxiety, depression, fibro, GERD, HTN, IBS, pancreatitis, RLS, hx heart cath   Clinical Impression  Patient received in bed, pleasant and willing to participate with skilled PT services. She is able to perform all functional bed mobility and transfers with general S, however does require Min guard for ambulation in hallway due to mild unsteadiness, patient able to self-correct without additional intervention from PT; gait distance limited by fatigue this afternoon. Patient seems most appropriate for skilled OP PT services moving forward at this point. Patient hesitant to agree to OP PT however reports she will consider it moving forward; she was left up in chair with all needs met and questions/concerns otherwise addressed.     Follow Up Recommendations Outpatient PT    Equipment Recommendations  None recommended by PT    Recommendations for Other Services       Precautions / Restrictions Precautions Precautions: Fall Restrictions Weight Bearing Restrictions: No      Mobility  Bed Mobility Overal bed mobility: Needs Assistance Bed Mobility: Supine to Sit     Supine to sit: Supervision     General bed mobility comments: able to don slippers at edge of bed without difficulty   Transfers Overall transfer level: Needs assistance Equipment used: None Transfers: Sit to/from Stand Sit to Stand: Supervision            Ambulation/Gait Ambulation/Gait assistance: Min guard Ambulation Distance (Feet): 200 Feet Assistive device: None Gait Pattern/deviations: Drifts right/left;Step-through pattern;Narrow base of support     General Gait Details: mild unsteadiness noted during gait pattern; gait distance limited by  fatigue   Stairs            Wheelchair Mobility    Modified Rankin (Stroke Patients Only)       Balance Overall balance assessment: Needs assistance Sitting-balance support: No upper extremity supported;Feet supported Sitting balance-Leahy Scale: Good     Standing balance support: No upper extremity supported;During functional activity Standing balance-Leahy Scale: Fair                               Pertinent Vitals/Pain Pain Assessment: Faces Faces Pain Scale: Hurts a little bit Pain Location: stomach  Pain Descriptors / Indicators: Sore Pain Intervention(s): Limited activity within patient's tolerance;Monitored during session    Home Living Family/patient expects to be discharged to:: Private residence Living Arrangements: Spouse/significant other Available Help at Discharge: Family Type of Home: House Home Access: Level entry     Home Layout: Two level Home Equipment: Environmental consultant - 2 wheels;Shower seat;Bedside commode      Prior Function                 Hand Dominance        Extremity/Trunk Assessment   Upper Extremity Assessment Upper Extremity Assessment: Overall WFL for tasks assessed    Lower Extremity Assessment Lower Extremity Assessment: Overall WFL for tasks assessed    Cervical / Trunk Assessment Cervical / Trunk Assessment: Kyphotic  Communication      Cognition Arousal/Alertness: Awake/alert Behavior During Therapy: WFL for tasks assessed/performed Overall Cognitive Status: Within Functional Limits for tasks assessed  General Comments      Exercises     Assessment/Plan    PT Assessment Patient needs continued PT services  PT Problem List Decreased strength;Decreased coordination;Decreased activity tolerance;Decreased balance       PT Treatment Interventions DME instruction;Therapeutic activities;Gait training;Therapeutic exercise;Patient/family  education;Stair training;Balance training;Functional mobility training;Neuromuscular re-education    PT Goals (Current goals can be found in the Care Plan section)  Acute Rehab PT Goals Patient Stated Goal: to get better PT Goal Formulation: With patient Time For Goal Achievement: 06/08/17 Potential to Achieve Goals: Good    Frequency Min 3X/week   Barriers to discharge        Co-evaluation               AM-PAC PT "6 Clicks" Daily Activity  Outcome Measure Difficulty turning over in bed (including adjusting bedclothes, sheets and blankets)?: None Difficulty moving from lying on back to sitting on the side of the bed? : None Difficulty sitting down on and standing up from a chair with arms (e.g., wheelchair, bedside commode, etc,.)?: None Help needed moving to and from a bed to chair (including a wheelchair)?: None Help needed walking in hospital room?: A Little Help needed climbing 3-5 steps with a railing? : A Little 6 Click Score: 22    End of Session   Activity Tolerance: Patient tolerated treatment well Patient left: in chair;with call bell/phone within reach   PT Visit Diagnosis: Unsteadiness on feet (R26.81);Muscle weakness (generalized) (M62.81);Difficulty in walking, not elsewhere classified (R26.2)    Time: 2130-8657 PT Time Calculation (min) (ACUTE ONLY): 25 min   Charges:   PT Evaluation $PT Eval Low Complexity: 1 Low PT Treatments $Self Care/Home Management: 8-22   PT G Codes:   PT G-Codes **NOT FOR INPATIENT CLASS** Functional Assessment Tool Used: AM-PAC 6 Clicks Basic Mobility;Clinical judgement Functional Limitation: Mobility: Walking and moving around Mobility: Walking and Moving Around Current Status (Q4696): At least 20 percent but less than 40 percent impaired, limited or restricted Mobility: Walking and Moving Around Goal Status [phone]): At least 1 percent but less than 20 percent impaired, limited or restricted    Deniece Ree PT, DPT,  CBIS  Supplemental Physical Therapist [facility]

## 2017-06-01 NOTE — Progress Notes (Addendum)
PROGRESS NOTE    Angela [name]  FIE:[id number] DOB: 06-09-1945 DOA: 05/29/2017 PCP: [name], MD   Brief Narrative: Angela [name] is a 71 y.o. femalewith a medical history offibromyalgia,hypertension, hyperlipidemia,whopresents emergency department with complaints of nausea, vomiting, abdominal pain that has been ongoing since Christmas Day. Patient states she's been unable to tolerate any of her oral medications. She's been trying sublingual Zofran with resolution of her symptoms. Patient also complained of abdominal soreness. She has also had headache. Patient states this occurred before. She has followed up with Dr. Oren Binet. It has been about a year. She states she had some type of emptying study which did show delay. Continued to Have Intractable N/V and Abdominal Pain but felt a little better today and had a bowel movement overnight. Diet was advanced to Full Liquid Diet.   Assessment & Plan:   Active Problems:   Hyperlipidemia   Dehydration   Fibromyalgia   Nausea and vomiting   Accelerated hypertension   Elevated lactic acid level   Headache   Constipation   Hypokalemia   Intractable Nausea, Vomiting, Abdominal Pain, improving  Possibly secondary to questionable gastroparesis(no history of diabetes) and HbA1c was 5.7. Patient follows with GI. Improvement with Zofran. Cannot tolerate Reglan or phenergan secondary to mental status changes. CT abdomen unremarkable for acute process.   Accelerated Hypertension -Suspect secondary to patient's inability to tolerate oral medications -Continue Imdur 60  Mg po Daily, Losartan 25 mg po Daily, Metoprolol (increased to home dose of 50 mg BID)  Elevated Lactic Acid/Lactic Acidosis Resolved. Secondary to dehydration. Treated with IV fluids  Headache, improved Suspect secondary to the above, dehydration and heart hypertension. CT head unremarkable.  Fibromyalgia/Restless Leg Syndrome Continue  Milnacipran 100 mg po BID, Tizanidine 4 mg po q 8h prn Spasms,Ropinirole 1 mg po Daily and 2 mg po qHS  Hyperlipidemia -Continue Rosuvastatin 20 mg po qHS and Omega-3 Acid Ethyl Esters 1 gram po BID  Depression/Anxiety -Continue Escitalopram 5 mg po Daily and Lorazepam 2 mg po Daily qHS  Dehydration -Secondary to the above -Patient with hemoconcentration on admission, hemoglobin 16.3 -Continue IV fluids  Constipation, improved -Continue Miralax BID, Senna-Docusate BID, and Bisacodyl Suppository PRN  Leukocytosis Resolved. No evidence of infection.  Hypokalemia Resolved with supplementation. -IV fluids     DVT prophylaxis: Lovenox Code Status: Full code Family Communication: Husband at bedside Disposition Plan: Anticipate discharge in AM tolerating by mouth   Consultants:   None  Procedures:   None  Antimicrobials:  None    Subjective: Feeling bad this morning. Dry heaving.  Objective: Vitals:   06/01/17 1022 06/01/17 1134 06/01/17 1417 06/01/17 1834  BP: (!) 174/91 (!) 158/73 128/63 132/74  Pulse: (!) 126 (!) 120 (!) 101 90  Resp: 18  18 18   Temp: 99.1 F (37.3 C)  99.1 F (37.3 C) 99.3 F (37.4 C)  TempSrc: Oral  Oral Oral  SpO2: 98%  98% 99%  Weight:      Height:        Intake/Output Summary (Last 24 hours) at 06/01/2017 1911 Last data filed at 06/01/2017 1828 Gross per 24 hour  Intake 3700 ml  Output 150 ml  Net 3550 ml   Filed Weights   05/29/17 1812 05/30/17 0500 06/01/17 0500  Weight: 73.7 kg (162 lb 7.7 oz) 73.9 kg (162 lb 14.7 oz) 73.5 kg (162 lb)    Examination:  General exam: Appears calm and comfortable  Respiratory system: Clear to auscultation. Respiratory effort normal.  Cardiovascular system: S1 & S2 heard, RRR. 2/6 systolic murmur. Gastrointestinal system: Abdomen is nondistended, soft. Normal bowel sounds heard. Central nervous system: Alert and oriented. No focal neurological deficits. Extremities: No edema. No  calf tenderness Skin: No cyanosis. No rashes Psychiatry: Judgement and insight appear normal. Mood & affect appropriate.     Data Reviewed: I have personally reviewed following labs and imaging studies  CBC: Recent Labs  Lab 05/29/17 1001 05/30/17 0610 05/31/17 0408 06/01/17 0329  WBC 12.1* 10.8* 9.4 7.7  NEUTROABS  --   --  5.5 3.7  HGB 16.3* 13.8 14.2 14.1  HCT 48.3* 42.3 42.8 41.5  MCV 86.1 87.0 86.3 86.5  PLT 349 312 287 400   Basic Metabolic Panel: Recent Labs  Lab 05/29/17 1001 05/30/17 0610 05/31/17 0408 06/01/17 0329  NA 140 137 136 136  K 4.2 3.5 3.1* 4.1  CL 99* 100* 101 105  CO2 27 25 25 23   GLUCOSE 186* 130* 117* 116*  BUN 19 13 10 9   CREATININE 1.07* 0.84 0.75 0.85  CALCIUM 9.9 8.9 9.0 8.9  MG  --   --  1.7 1.7  PHOS  --   --  3.2 2.6   GFR: Estimated Creatinine Clearance: 63.2 mL/min (by C-G formula based on SCr of 0.85 mg/dL). Liver Function Tests: Recent Labs  Lab 05/29/17 1001 05/31/17 0408 06/01/17 0329  AST 26 15 15   ALT 16 11* 12*  ALKPHOS 104 91 86  BILITOT 0.9 1.0 0.6  PROT 8.2* 6.8 6.6  ALBUMIN 4.6 3.7 3.8   Recent Labs  Lab 05/29/17 1001  LIPASE 26   No results for input(s): AMMONIA in the last 168 hours. Coagulation Profile: Recent Labs  Lab 05/29/17 1316  INR 1.02   Cardiac Enzymes: No results for input(s): CKTOTAL, CKMB, CKMBINDEX, TROPONINI in the last 168 hours. BNP (last 3 results) No results for input(s): PROBNP in the last 8760 hours. HbA1C: Recent Labs    05/30/17 1000  HGBA1C 5.7*   CBG: No results for input(s): GLUCAP in the last 168 hours. Lipid Profile: No results for input(s): CHOL, HDL, LDLCALC, TRIG, CHOLHDL, LDLDIRECT in the last 72 hours. Thyroid Function Tests: No results for input(s): TSH, T4TOTAL, FREET4, T3FREE, THYROIDAB in the last 72 hours. Anemia Panel: No results for input(s): VITAMINB12, FOLATE, FERRITIN, TIBC, IRON, RETICCTPCT in the last 72 hours. Sepsis Labs: Recent Labs  Lab  05/29/17 1538 05/31/17 0928 05/31/17 1311 06/01/17 0745  LATICACIDVEN 2.11* 2.8* 2.5* 0.9    Recent Results (from the past 240 hour(s))  Urine culture     Status: Abnormal   Collection Time: 05/29/17  2:16 PM  Result Value Ref Range Status   Specimen Description URINE, CLEAN CATCH  Final   Special Requests NONE  Final   Culture MULTIPLE SPECIES PRESENT, SUGGEST RECOLLECTION (A)  Final   Report Status 05/30/2017 FINAL  Final         Radiology Studies: No results found.      Scheduled Meds: . aspirin EC  81 mg Oral Daily  . enoxaparin (LOVENOX) injection  40 mg Subcutaneous Q24H  . escitalopram  5 mg Oral Daily  . isosorbide mononitrate  60 mg Oral Daily  . LORazepam  2 mg Oral QHS  . losartan  25 mg Oral Daily  . metoprolol tartrate  50 mg Oral BID  . Milnacipran  100 mg Oral BID  . omega-3 acid ethyl esters  1 g Oral BID  . ondansetron (ZOFRAN) IV  4 mg Intravenous Q8H  . polyethylene glycol  17 g Oral BID  . rOPINIRole  1 mg Oral Daily  . rOPINIRole  2 mg Oral QHS  . rosuvastatin  20 mg Oral q1800  . senna-docusate  1 tablet Oral BID   Continuous Infusions: . sodium chloride 100 mL/hr at 06/01/17 1050     LOS: 2 days     Cordelia Poche, MD [facility] 06/01/2017, 7:11 PM Pager: [phone]  If 7PM-7AM, please contact night-coverage [email or web address] Password [email or web address] 06/01/2017, 7:11 PM

## 2017-06-02 ENCOUNTER — Inpatient Hospital Stay (HOSPITAL_COMMUNITY): Payer: Medicare Other

## 2017-06-02 DIAGNOSIS — E860 Dehydration: Secondary | ICD-10-CM

## 2017-06-02 DIAGNOSIS — I361 Nonrheumatic tricuspid (valve) insufficiency: Secondary | ICD-10-CM

## 2017-06-02 DIAGNOSIS — R112 Nausea with vomiting, unspecified: Secondary | ICD-10-CM

## 2017-06-02 LAB — ECHOCARDIOGRAM COMPLETE
Height: 66 in
WEIGHTICAEL: 2592 [oz_av]

## 2017-06-02 MED ORDER — ONDANSETRON 4 MG PO TBDP: 4.0000 mg | ORAL_TABLET | Freq: Four times a day (QID) | ORAL | 0 refills | Status: DC | PRN

## 2017-06-02 MED ORDER — POLYETHYLENE GLYCOL 3350 17 G PO PACK: 17.0000 g | PACK | Freq: Two times a day (BID) | ORAL | 0 refills | Status: DC

## 2017-06-02 NOTE — Progress Notes (Signed)
Pt discharged to home accompanied by husband.  Pt is to follow up with cardiologist regarding 2D echo.  Pt given Rxs and explained.  Pt and husband given DC instructions and reviewed.  No further questions about home self care.

## 2017-06-02 NOTE — Progress Notes (Signed)
2D echo is here today.

## 2017-06-02 NOTE — Progress Notes (Signed)
2D Echo paged from operator, to call me back to see if doing echo's today.

## 2017-06-02 NOTE — Discharge Instructions (Signed)
Gastroparesis °Gastroparesis, also called delayed gastric emptying, is a condition in which food takes longer than normal to empty from the stomach. The condition is usually long-lasting (chronic). °What are the causes? °This condition may be caused by: °· An endocrine disorder, such as hypothyroidism or diabetes. Diabetes is the most common cause of this condition. °· A nervous system disease, such as Parkinson disease or multiple sclerosis. °· Cancer, infection, or surgery of the stomach or vagus nerve. °· A connective tissue disorder, such as scleroderma. °· Certain medicines. ° °In most cases, the cause is not known. °What increases the risk? °This condition is more likely to develop in: °· People with certain disorders, including endocrine disorders, eating disorders, amyloidosis, and scleroderma. °· People with certain diseases, including Parkinson disease or multiple sclerosis. °· People with cancer or infection of the stomach or vagus nerve. °· People who have had surgery on the stomach or vagus nerve. °· People who take certain medicines. °· Women. ° °What are the signs or symptoms? °Symptoms of this condition include: °· An early feeling of fullness when eating. °· Nausea. °· Weight loss. °· Vomiting. °· Heartburn. °· Abdominal bloating. °· Inconsistent blood glucose levels. °· Lack of appetite. °· Acid from the stomach coming up into the esophagus (gastroesophageal reflux). °· Spasms of the stomach. ° °Symptoms may come and go. °How is this diagnosed? °This condition is diagnosed with tests, such as: °· Tests that check how long it takes food to move through the stomach and intestines. These tests include: °? Upper gastrointestinal (GI) series. In this test, X-rays of the intestines are taken after you drink a liquid. The liquid makes the intestines show up better on the X-rays. °? Gastric emptying scintigraphy. In this test, scans are taken after you eat food that contains a small amount of radioactive  material. °? Wireless capsule GI monitoring system. This test involves swallowing a capsule that records information about movement through the stomach. °· Gastric manometry. This test measures electrical and muscular activity in the stomach. It is done with a thin tube that is passed down the throat and into the stomach. °· Endoscopy. This test checks for abnormalities in the lining of the stomach. It is done with a long, thin tube that is passed down the throat and into the stomach. °· An ultrasound. This test can help rule out gallbladder disease or pancreatitis as a cause of your symptoms. It uses sound waves to take pictures of the inside of your body. ° °How is this treated? °There is no cure for gastroparesis. This condition may be managed with: °· Treatment of the underlying condition causing the gastroparesis. °· Lifestyle changes, including exercise and dietary changes. Dietary changes can include: °? Changes in what and when you eat. °? Eating smaller meals more often. °? Eating low-fat foods. °? Eating low-fiber forms of high-fiber foods, such as cooked vegetables instead of raw vegetables. °? Having liquid foods in place of solid foods. Liquid foods are easier to digest. °· Medicines. These may be given to control nausea and vomiting and to stimulate stomach muscles. °· Getting food through a feeding tube. This may be done in severe cases. °· A gastric neurostimulator. This is a device that is inserted into the body with surgery. It helps improve stomach emptying and control nausea and vomiting. ° °Follow these instructions at home: °· Follow your health care provider's instructions about exercise and diet. °· Take medicines only as directed by your health care provider. °Contact a   health care provider if: °· Your symptoms do not improve with treatment. °· You have new symptoms. °Get help right away if: °· You have severe abdominal pain that does not improve with treatment. °· You have nausea that does  not go away. °· You cannot keep fluids down. °This information is not intended to replace advice given to you by your health care provider. Make sure you discuss any questions you have with your health care provider. °Document Released: 05/19/2005 Document Revised: 10/25/2015 Document Reviewed: 05/15/2014 °[employer] Interactive Patient Education © 2018 [employer] ° °

## 2017-06-02 NOTE — Discharge Summary (Addendum)
Physician Discharge Summary  Angela [name] EHU:[id number] DOB: [birth year] DOA: 05/29/2017  PCP: [name], MD  Admit date: 05/29/2017 Discharge date: 06/02/2017  Admitted From: Home Disposition: Home  Recommendations for Outpatient Follow-up:  1. Follow up with PCP in 1 week 2. Follow up with GI as needed 3. Please obtain BMP/CBC in one week 4. Please follow up on the following pending results: None  Home Health: None Equipment/Devices: None  Discharge Condition: Stable CODE STATUS: Full code Diet recommendation: Soft diet, low fiber  Brief/Interim Summary:  Admission HPI written by Angela Ford, DO   Chief Complaint: Nausea, vomiting, abdominal pain  HPI: Angela [name] is a 72 y.o. female with a medical history of fibromyalgia, hypertension, hyperlipidemia, who presents emergency department with complaints of nausea, vomiting, abdominal pain that has been ongoing since Christmas Day. Patient states she's been unable to tolerate any of her oral medications. She's been trying sublingual Zofran with resolution of her symptoms. [name] complains of abdominal soreness. She has also had headache. Denies any recent ill contacts or illness. Denies any current chest pain, shortness of breath, diarrhea, constipation, dizziness, painful or frequent urination. Patient states this occurred before. She has followed up with Dr. Penelope [name], gastroenterologist. It has been about a year. She states she had some type of emptying study which did show delay.  ED Course: CT head, abdomen unremarkable. UA as well chest x-ray unremarkable. Labs within normal limits.TRH called for admission.      Hospital course:  IntractableNausea,Vomiting,AbdominalPain, improving Possibly secondary to questionable gastroparesis(no history of diabetes) and HbA1c was 5.7. Patient follows with GI. Improvement with Zofran. Cannot tolerate Reglan or phenergan secondary to mental status changes. CT abdomen  unremarkable for acute process. Stable prior to discharge.   Accelerated Hypertension Suspect secondary to patient's inability to tolerate oral medications. Continued Imdur 60 Mg po Daily,Losartan 25 mg po Daily,Metoprolol with improved blood pressure.  Elevated Lactic Acid/Lactic Acidosis Resolved. Secondary to dehydration. Treated with IV fluids  Headache, improved Suspect secondary to the above, dehydration and heart hypertension. CT head unremarkable. Resolved.  Fibromyalgia/RestlessLegSyndrome Continued Milnacipran 100 mg po BID,Tizanidine 4 mg po q 8h prn Spasms,Ropinirole 1 mg po Daily and 2 mg po qHS  Hyperlipidemia ContinuedRosuvastatin 20 mg po qHS and Omega-3 Acid Ethyl Esters 1 gram po BID  Depression/Anxiety ContinuedEscitalopram 5 mg po Daily and Lorazepam 2 mg po Daily qHS  Dehydration Secondary to the above. Patient with hemoconcentrationon admission, hemoglobin 16.3. Given IV fluids\  Constipation Miralax BID, Senna-Docusate BID, and Bisacodyl Suppository PRN  Leukocytosis Resolved. No evidence of infection.  Hypokalemia Resolved with supplementation.  Mild tricuspid regurgitation Murmur heard. Transthoracic Echocardiogram significant for TR.     Discharge Diagnoses:  Active Problems:   Hyperlipidemia   Dehydration   Fibromyalgia   Nausea and vomiting   Accelerated hypertension   Elevated lactic acid level   Headache   Constipation   Hypokalemia    Discharge Instructions   Allergies as of 06/02/2017      Reactions   Norvasc [amlodipine Besylate] Swelling   Phenergan [promethazine] Other (See Comments)   Restlessness   Timentin [ticarcillin-pot Clavulanate] Anaphylaxis   Has patient had a PCN reaction causing immediate rash, facial/tongue/throat swelling, SOB or lightheadedness with hypotension: Yes Has patient had a PCN reaction causing severe rash involving mucus membranes or skin necrosis: Yes Has patient had a PCN  reaction that required hospitalization Yes Has patient had a PCN reaction occurring within the last 10 years: No If all  of the above answers are "NO", then may proceed with Cephalosporin use.   Pregabalin Other (See Comments)   Mouth sores   Reglan [metoclopramide] Other (See Comments)   Cannot sit still      Medication List    TAKE these medications   aspirin EC 81 MG tablet Take 81 mg by mouth daily.   B-12 2500 MCG Tabs Take 2,500 mcg by mouth daily.   DULoxetine 20 MG capsule Commonly known as:  CYMBALTA Take 20 mg by mouth daily.   escitalopram 5 MG tablet Commonly known as:  LEXAPRO Take 5 mg by mouth at bedtime.   fluticasone 50 MCG/ACT nasal spray Commonly known as:  FLONASE Place 2 sprays into both nostrils daily as needed (seasonal allergies).   HYDROcodone-acetaminophen 10-325 MG tablet Commonly known as:  NORCO Take 1 tablet by mouth 4 (four) times daily as needed (pain).   isosorbide mononitrate 30 MG 24 hr tablet Commonly known as:  IMDUR Take 1 tablet (30 mg total) by mouth daily.   LORazepam 2 MG tablet Commonly known as:  ATIVAN Take 2 mg by mouth at bedtime.   losartan 25 MG tablet Commonly known as:  COZAAR Take 1 tablet (25 mg total) by mouth daily. What changed:  how much to take   LOVAZA 1 g capsule Generic drug:  omega-3 acid ethyl esters Take 1 g by mouth daily.   metoprolol tartrate 50 MG tablet Commonly known as:  LOPRESSOR Take 1 tablet (50 mg total) by mouth 2 (two) times daily.   nitroGLYCERIN 0.4 MG SL tablet Commonly known as:  NITROSTAT Place 1 tablet (0.4 mg total) under the tongue every 5 (five) minutes as needed for chest pain.   ondansetron 4 MG disintegrating tablet Commonly known as:  ZOFRAN-ODT Take 1 tablet (4 mg total) by mouth every 6 (six) hours as needed for nausea or vomiting.   pantoprazole 40 MG tablet Commonly known as:  PROTONIX Take 40 mg by mouth daily.   polyethylene glycol packet Commonly known  as:  MIRALAX / GLYCOLAX Take 17 g by mouth 2 (two) times daily.   rOPINIRole 1 MG tablet Commonly known as:  REQUIP Take 1-2 mg by mouth See admin instructions. Take 2 tablets (2 mg) by mouth daily at bedtime, may also take 1 tablet (1 mg) in the afternoon as needed for restless legs   rosuvastatin 20 MG tablet Commonly known as:  CRESTOR Take 20 mg by mouth daily.   SAVELLA 100 MG Tabs tablet Generic drug:  Milnacipran HCl Take 100 mg by mouth 2 (two) times daily. What changed:  Another medication with the same name was removed. Continue taking this medication, and follow the directions you see here.   tiZANidine 4 MG tablet Commonly known as:  ZANAFLEX Take 4 mg by mouth every 8 (eight) hours as needed for muscle spasms.       Allergies  Allergen Reactions  . Norvasc [Amlodipine Besylate] Swelling  . Phenergan [Promethazine] Other (See Comments)    Restlessness   . Timentin [Ticarcillin-Pot Clavulanate] Anaphylaxis    Has patient had a PCN reaction causing immediate rash, facial/tongue/throat swelling, SOB or lightheadedness with hypotension: Yes Has patient had a PCN reaction causing severe rash involving mucus membranes or skin necrosis: Yes Has patient had a PCN reaction that required hospitalization Yes Has patient had a PCN reaction occurring within the last 10 years: No If all of the above answers are "NO", then may proceed with Cephalosporin use.   Marland Kitchen  Pregabalin Other (See Comments)    Mouth sores  . Reglan [Metoclopramide] Other (See Comments)    Cannot sit still    Consultations:  None   Procedures/Studies: Dg Chest 2 View  Result Date: 05/29/2017 CLINICAL DATA:  Nausea and vomiting EXAM: CHEST  2 VIEW COMPARISON:  09/30/2013, 02/05/2013 FINDINGS: Postsurgical changes in the cervical spine. No consolidation or pleural effusion. Normal cardiomediastinal silhouette. No pneumothorax. Mild degenerative changes of the spine IMPRESSION: No active cardiopulmonary  disease. Electronically Signed   By: Donavan Foil M.D.   On: 05/29/2017 14:47   Ct Head Wo Contrast  Result Date: 05/29/2017 CLINICAL DATA:  Headache and hypertension. EXAM: CT HEAD WITHOUT CONTRAST TECHNIQUE: Contiguous axial images were obtained from the base of the skull through the vertex without intravenous contrast. COMPARISON:  None. FINDINGS: Brain: No evidence of acute infarction, hemorrhage, hydrocephalus, extra-axial collection or mass lesion/mass effect. Age-related cerebral atrophy with compensatory dilatation of the ventricles. Vascular: No hyperdense vessel or unexpected calcification. Skull: Normal. Negative for fracture or focal lesion. Sinuses/Orbits: No acute finding. Other: None. IMPRESSION: 1. Normal for age noncontrast head CT. Electronically Signed   By: Titus Dubin M.D.   On: 05/29/2017 15:12   Ct Abdomen Pelvis W Contrast  Result Date: 05/29/2017 CLINICAL DATA:  Nausea and vomiting with abdominal pain. Prior partial colectomy for colon cancer. No bowel movement for the past 4 days. EXAM: CT ABDOMEN AND PELVIS WITH CONTRAST TECHNIQUE: Multidetector CT imaging of the abdomen and pelvis was performed using the standard protocol following bolus administration of intravenous contrast. CONTRAST:  172mL ISOVUE-300 IOPAMIDOL (ISOVUE-300) INJECTION 61% COMPARISON:  CT abdomen pelvis dated May 16, 2016. FINDINGS: Lower chest: No acute abnormality. Stable scarring and subsegmental atelectasis in the right lower lobe. Unchanged triangular, subpleural nodule in the left lower lobe, likely a lymph node. Hepatobiliary: Unchanged hemangioma in hepatic dome, stable since 2014. Unchanged focal fat along the falciform ligament. Prior cholecystectomy. Stable mild extrahepatic biliary dilatation. Pancreas: Unremarkable. No pancreatic ductal dilatation or surrounding inflammatory changes. Spleen: Normal in size without focal abnormality. Adrenals/Urinary Tract: The adrenal glands are  unremarkable. Stable bilateral renal cysts. No renal or ureteral calculi. No hydronephrosis. The bladder is unremarkable. Stomach/Bowel: Stable small hiatal hernia. Postsurgical changes from prior partial colectomy again noted. No bowel obstruction, wall thickening, or surrounding inflammatory changes. Vascular/Lymphatic: Mild aortic atherosclerosis. No enlarged abdominal or pelvic lymph nodes. Reproductive: Status post hysterectomy. No adnexal masses. Other: No free fluid or pneumoperitoneum. Musculoskeletal: No acute or significant osseous findings. Degenerative changes of the thoracolumbar spine. IMPRESSION: 1. No acute intra-abdominal process. Prior partial colectomy. No bowel obstruction. 2.  Aortic atherosclerosis (ICD10-I70.0). Electronically Signed   By: Titus Dubin M.D.   On: 05/29/2017 15:35     Transthoracic Echocardiogram (06/02/2017) Study Conclusions  - Left ventricle: The cavity size was normal. There was mild   concentric hypertrophy. Systolic function was vigorous. The   estimated ejection fraction was in the range of 65% to 70%. Wall   motion was normal; there were no regional wall motion   abnormalities. Doppler parameters are consistent with abnormal   left ventricular relaxation (grade 1 diastolic dysfunction).   There was no evidence of elevated ventricular filling pressure by   Doppler parameters. - Aortic valve: Trileaflet; moderately thickened, moderately   calcified leaflets. Transvalvular velocity was minimally   increased. There was very mild stenosis. There was no   regurgitation. - Mitral valve: There was no regurgitation. - Right ventricle: Systolic function was normal. -  Tricuspid valve: There was mild regurgitation. - Pulmonary arteries: Systolic pressure was within the normal   range. - Inferior vena cava: The vessel was normal in size. - Pericardium, extracardiac: There was no pericardial effusion.  Impressions:  - Hyperdynamic LVEF 65-70% with  mild intracavitary gradient during   systole.   Subjective: Nausea resolved. Mild abdominal pain improved.  Discharge Exam: Vitals:   06/02/17 0647 06/02/17 1140  BP: (!) 152/71 (!) 142/74  Pulse: 88 65  Resp: 17   Temp: 98.7 F (37.1 C) 98.9 F (37.2 C)  SpO2: 99% 98%   Vitals:   06/01/17 2148 06/02/17 0155 06/02/17 0647 06/02/17 1140  BP:  (!) 154/69 (!) 152/71 (!) 142/74  Pulse:  73 88 65  Resp: 18 17 17    Temp:  98.9 F (37.2 C) 98.7 F (37.1 C) 98.9 F (37.2 C)  TempSrc: Oral   Oral  SpO2:  96% 99% 98%  Weight:      Height:        General: Pt is alert, awake, not in acute distress Cardiovascular: RRR, S1/S2 +, no rubs, no gallops, 2/6 systolic murmur Respiratory: CTA bilaterally, no wheezing, no rhonchi Abdominal: Soft, mild RLQ tenderness, ND, bowel sounds + Extremities: no edema, no cyanosis    The results of significant diagnostics from this hospitalization (including imaging, microbiology, ancillary and laboratory) are listed below for reference.     Microbiology: Recent Results (from the past 240 hour(s))  Urine culture     Status: Abnormal   Collection Time: 05/29/17  2:16 PM  Result Value Ref Range Status   Specimen Description URINE, CLEAN CATCH  Final   Special Requests NONE  Final   Culture MULTIPLE SPECIES PRESENT, SUGGEST RECOLLECTION (A)  Final   Report Status 05/30/2017 FINAL  Final     Labs: BNP (last 3 results) No results for input(s): BNP in the last 8760 hours. Basic Metabolic Panel: Recent Labs  Lab 05/29/17 1001 05/30/17 0610 05/31/17 0408 06/01/17 0329  NA 140 137 136 136  K 4.2 3.5 3.1* 4.1  CL 99* 100* 101 105  CO2 27 25 25 23   GLUCOSE 186* 130* 117* 116*  BUN 19 13 10 9   CREATININE 1.07* 0.84 0.75 0.85  CALCIUM 9.9 8.9 9.0 8.9  MG  --   --  1.7 1.7  PHOS  --   --  3.2 2.6   Liver Function Tests: Recent Labs  Lab 05/29/17 1001 05/31/17 0408 06/01/17 0329  AST 26 15 15   ALT 16 11* 12*  ALKPHOS 104 91 86   BILITOT 0.9 1.0 0.6  PROT 8.2* 6.8 6.6  ALBUMIN 4.6 3.7 3.8   Recent Labs  Lab 05/29/17 1001  LIPASE 26   No results for input(s): AMMONIA in the last 168 hours. CBC: Recent Labs  Lab 05/29/17 1001 05/30/17 0610 05/31/17 0408 06/01/17 0329  WBC 12.1* 10.8* 9.4 7.7  NEUTROABS  --   --  5.5 3.7  HGB 16.3* 13.8 14.2 14.1  HCT 48.3* 42.3 42.8 41.5  MCV 86.1 87.0 86.3 86.5  PLT 349 312 287 271   Cardiac Enzymes: No results for input(s): CKTOTAL, CKMB, CKMBINDEX, TROPONINI in the last 168 hours. BNP: Invalid input(s): POCBNP CBG: No results for input(s): GLUCAP in the last 168 hours. D-Dimer No results for input(s): DDIMER in the last 72 hours. Hgb A1c No results for input(s): HGBA1C in the last 72 hours. Lipid Profile No results for input(s): CHOL, HDL, LDLCALC, TRIG, CHOLHDL, LDLDIRECT in  the last 72 hours. Thyroid function studies No results for input(s): TSH, T4TOTAL, T3FREE, THYROIDAB in the last 72 hours.  Invalid input(s): FREET3 Anemia work up No results for input(s): VITAMINB12, FOLATE, FERRITIN, TIBC, IRON, RETICCTPCT in the last 72 hours. Urinalysis    Component Value Date/Time   COLORURINE STRAW (A) 05/29/2017 1416   APPEARANCEUR CLEAR 05/29/2017 1416   LABSPEC 1.013 05/29/2017 1416   PHURINE 7.0 05/29/2017 1416   GLUCOSEU 50 (A) 05/29/2017 1416   HGBUR SMALL (A) 05/29/2017 1416   BILIRUBINUR NEGATIVE 05/29/2017 1416   KETONESUR NEGATIVE 05/29/2017 1416   PROTEINUR 30 (A) 05/29/2017 1416   UROBILINOGEN 1.0 12/24/2010 1437   NITRITE NEGATIVE 05/29/2017 1416   LEUKOCYTESUR TRACE (A) 05/29/2017 1416   Sepsis Labs Invalid input(s): PROCALCITONIN,  WBC,  LACTICIDVEN Microbiology Recent Results (from the past 240 hour(s))  Urine culture     Status: Abnormal   Collection Time: 05/29/17  2:16 PM  Result Value Ref Range Status   Specimen Description URINE, CLEAN CATCH  Final   Special Requests NONE  Final   Culture MULTIPLE SPECIES PRESENT, SUGGEST  RECOLLECTION (A)  Final   Report Status 05/30/2017 FINAL  Final      SIGNED:   Cordelia Poche, MD [facility] 06/02/2017, 12:26 PM Pager [phone]  If 7PM-7AM, please contact night-coverage [email or web address] Password [email or web address]

## 2017-06-03 NOTE — Consult Note (Signed)
           [facility] CM Primary Care Navigator  06/03/2017  Angela [name] [birth year] [id number]   Attempt to see patient at the bedside to identify possible discharge needs but she was already discharged per staff report.  Patient was discharged home yesterday after being admitted/ treated for intractable nausea, vomiting and abdominalpain.  Primary care provider's office is listed as providing transition of care (TOC).  Patient has discharge instruction to follow-up with primary care provider within 1 week of discharge as well as follow-up with GI as needed.   For questions, please contact:  Dannielle Huh, BSN, RN- [facility] Primary Care Navigator  Telephone: [phone] [place]

## 2017-06-17 DIAGNOSIS — G894 Chronic pain syndrome: Secondary | ICD-10-CM | POA: Diagnosis not present

## 2017-06-17 DIAGNOSIS — M5412 Radiculopathy, cervical region: Secondary | ICD-10-CM | POA: Diagnosis not present

## 2017-06-17 DIAGNOSIS — Z79899 Other long term (current) drug therapy: Secondary | ICD-10-CM | POA: Diagnosis not present

## 2017-06-17 DIAGNOSIS — M5030 Other cervical disc degeneration, unspecified cervical region: Secondary | ICD-10-CM | POA: Diagnosis not present

## 2017-06-17 DIAGNOSIS — M961 Postlaminectomy syndrome, not elsewhere classified: Secondary | ICD-10-CM | POA: Diagnosis not present

## 2017-06-17 DIAGNOSIS — Z79891 Long term (current) use of opiate analgesic: Secondary | ICD-10-CM | POA: Diagnosis not present

## 2017-06-24 DIAGNOSIS — E785 Hyperlipidemia, unspecified: Secondary | ICD-10-CM | POA: Diagnosis not present

## 2017-06-24 DIAGNOSIS — I10 Essential (primary) hypertension: Secondary | ICD-10-CM | POA: Diagnosis not present

## 2017-06-24 DIAGNOSIS — I2510 Atherosclerotic heart disease of native coronary artery without angina pectoris: Secondary | ICD-10-CM | POA: Diagnosis not present

## 2017-06-24 DIAGNOSIS — R110 Nausea: Secondary | ICD-10-CM | POA: Diagnosis not present

## 2017-06-24 DIAGNOSIS — F329 Major depressive disorder, single episode, unspecified: Secondary | ICD-10-CM | POA: Diagnosis not present

## 2017-06-24 DIAGNOSIS — Z09 Encounter for follow-up examination after completed treatment for conditions other than malignant neoplasm: Secondary | ICD-10-CM | POA: Diagnosis not present

## 2017-06-24 DIAGNOSIS — G4700 Insomnia, unspecified: Secondary | ICD-10-CM | POA: Diagnosis not present

## 2017-06-29 ENCOUNTER — Telehealth: Payer: Self-pay

## 2017-06-29 NOTE — Telephone Encounter (Signed)
SENT REFERRAL TO SCHEDULING 

## 2017-06-30 DIAGNOSIS — L570 Actinic keratosis: Secondary | ICD-10-CM | POA: Diagnosis not present

## 2017-06-30 DIAGNOSIS — D225 Melanocytic nevi of trunk: Secondary | ICD-10-CM | POA: Diagnosis not present

## 2017-06-30 DIAGNOSIS — D485 Neoplasm of uncertain behavior of skin: Secondary | ICD-10-CM | POA: Diagnosis not present

## 2017-06-30 DIAGNOSIS — I781 Nevus, non-neoplastic: Secondary | ICD-10-CM | POA: Diagnosis not present

## 2017-07-08 DIAGNOSIS — Z85038 Personal history of other malignant neoplasm of large intestine: Secondary | ICD-10-CM | POA: Diagnosis not present

## 2017-07-08 DIAGNOSIS — I10 Essential (primary) hypertension: Secondary | ICD-10-CM | POA: Diagnosis not present

## 2017-07-08 DIAGNOSIS — K5903 Drug induced constipation: Secondary | ICD-10-CM | POA: Diagnosis not present

## 2017-07-08 DIAGNOSIS — K3184 Gastroparesis: Secondary | ICD-10-CM | POA: Diagnosis not present

## 2017-07-08 DIAGNOSIS — R1010 Upper abdominal pain, unspecified: Secondary | ICD-10-CM | POA: Diagnosis not present

## 2017-07-08 DIAGNOSIS — R110 Nausea: Secondary | ICD-10-CM | POA: Diagnosis not present

## 2017-07-14 DIAGNOSIS — G894 Chronic pain syndrome: Secondary | ICD-10-CM | POA: Diagnosis not present

## 2017-07-14 DIAGNOSIS — M961 Postlaminectomy syndrome, not elsewhere classified: Secondary | ICD-10-CM | POA: Diagnosis not present

## 2017-07-14 DIAGNOSIS — M542 Cervicalgia: Secondary | ICD-10-CM | POA: Diagnosis not present

## 2017-07-14 DIAGNOSIS — Z79899 Other long term (current) drug therapy: Secondary | ICD-10-CM | POA: Diagnosis not present

## 2017-07-14 DIAGNOSIS — M5412 Radiculopathy, cervical region: Secondary | ICD-10-CM | POA: Diagnosis not present

## 2017-07-14 DIAGNOSIS — Z79891 Long term (current) use of opiate analgesic: Secondary | ICD-10-CM | POA: Diagnosis not present

## 2017-07-22 ENCOUNTER — Ambulatory Visit: Payer: Medicare Other | Admitting: Cardiology

## 2017-08-04 DIAGNOSIS — I10 Essential (primary) hypertension: Secondary | ICD-10-CM | POA: Diagnosis not present

## 2017-08-04 DIAGNOSIS — B029 Zoster without complications: Secondary | ICD-10-CM | POA: Diagnosis not present

## 2017-08-12 DIAGNOSIS — M961 Postlaminectomy syndrome, not elsewhere classified: Secondary | ICD-10-CM | POA: Diagnosis not present

## 2017-08-12 DIAGNOSIS — M5412 Radiculopathy, cervical region: Secondary | ICD-10-CM | POA: Diagnosis not present

## 2017-08-12 DIAGNOSIS — M5030 Other cervical disc degeneration, unspecified cervical region: Secondary | ICD-10-CM | POA: Diagnosis not present

## 2017-08-12 DIAGNOSIS — G894 Chronic pain syndrome: Secondary | ICD-10-CM | POA: Diagnosis not present

## 2017-08-14 ENCOUNTER — Ambulatory Visit: Payer: Medicare Other | Admitting: Cardiology

## 2017-08-19 ENCOUNTER — Encounter: Payer: Self-pay | Admitting: Cardiology

## 2017-08-19 ENCOUNTER — Ambulatory Visit (INDEPENDENT_AMBULATORY_CARE_PROVIDER_SITE_OTHER): Payer: Medicare Other | Admitting: Cardiology

## 2017-08-19 VITALS — BP 144/86 | HR 70 | Ht 67.0 in | Wt 168.0 lb

## 2017-08-19 DIAGNOSIS — K5903 Drug induced constipation: Secondary | ICD-10-CM | POA: Diagnosis not present

## 2017-08-19 DIAGNOSIS — R0609 Other forms of dyspnea: Secondary | ICD-10-CM

## 2017-08-19 DIAGNOSIS — R0600 Dyspnea, unspecified: Secondary | ICD-10-CM | POA: Insufficient documentation

## 2017-08-19 DIAGNOSIS — R0602 Shortness of breath: Secondary | ICD-10-CM

## 2017-08-19 DIAGNOSIS — E785 Hyperlipidemia, unspecified: Secondary | ICD-10-CM | POA: Diagnosis not present

## 2017-08-19 DIAGNOSIS — Z85038 Personal history of other malignant neoplasm of large intestine: Secondary | ICD-10-CM | POA: Diagnosis not present

## 2017-08-19 MED ORDER — METOPROLOL TARTRATE 50 MG PO TABS: 75.0000 mg | ORAL_TABLET | Freq: Two times a day (BID) | ORAL | 1 refills | Status: DC

## 2017-08-19 NOTE — Patient Instructions (Addendum)
Medication Instructions:  Your physician has recommended you make the following change in your medication:  1. INCREASE METOPROLOL 50 MG TAKING 1 1/2 TABLET TWICE A DAY    Labwork: April 8th, 2019 FASTING LIPIDS, BMET,LFT & PRO BNP   Testing/Procedures: None ordered  Follow-Up: Arrive at 11:15 am to see Ellen Henri PA-C on 09/21/2017  Any Other Special Instructions Will Be Listed Below (If Applicable).     If you need a refill on your cardiac medications before your next appointment, please call your pharmacy.

## 2017-08-19 NOTE — Progress Notes (Signed)
08/19/2017 Deveron Furlong   [birth year]  [id number]  Primary Physician [name], MD Primary Cardiologist: Dr. [name]   Reason for Visit/CC: Intermittent Chest Pain, Chronic Exertional Dyspnea   HPI:  Angela [name] is a 72 y.o. female who is being seen today for a minute chest pain and chronic exertional dyspnea, at the request of her PCP Dr. Tina Griffith[facility].  The patient has known coronary disease which is been treated medically.  Has a history of hypertension, hyperlipidemia as well as prior history of gastroparesis, however no history of diabetes.  She has been evaluated by GI for this.  2017, she underwent workup for exertional chest pain. NST was abnormal with possible ischemia in the inferior to inferolateral region. Cardiac catheterization revealed mild to moderate CAD with 30 and 40% proximal mid LAD stenosis and 70-75% ostial first diagonal, normal circumflex and smooth 25% mid RCA and a dominant RCA vessel, EF 55-65%. Given her stress test showed possible ischemia in the inferior to inferolateral region and had relatively normal perfusion in the distribution of her stenosis involving the ostium of the diagonal vessel, medical therapy was recommended. Nitrates and low dose BB was added to her regimen. Crestor was increased to 40 mg, however she did not tolerate the higher dose due to myalgias. This was reduced back down to 20 mg.   Seen by our office in February 2018 and was doing fairly well at that time from a cardiac standpoint without any chest pain.  She continued to have mild exertional dyspnea with moderate to heavy physical activity.  Since her last visit, the patient reports that she was readmitted to the hospital December 2018 for recurrent gastroparesis/delayed gastric emptying.  During that time, she had difficulties keeping down her medications thus as a result she had significant hypertension/hypertensive emergency.  Once her GI issues resolved she was able  to better tolerate her medications.  She reports that her PCP further increased her losartan to 100 mg daily.  That hospitalization, she also had an echocardiogram which showed normal left ventricular EF and normal wall motion.  Grade 1 diastolic dysfunction was noted.  Trivial tricuspid regurgitation was noted.  Pulmonary artery pressure was normal.  She was recently seen by her PCP and noted intermittent substernal chest discomfort, occurring 4-5 times a week.  The discomfort does not radiate.  It is atypical in the fact that it only lasts a couple seconds.  She denies any prolonged chest pain.  Pain comes and goes and resolves spontaneously without any need for sublingual nitroglycerin.  She continues to have chronic exertional dyspnea with activity such as walking upstairs.  However she reports that this is stable and has not changed since her heart catheterization.  Denies resting dyspnea, lower extremity edema, orthopnea, PND and weight gain.  EKG today shows normal sinus rhythm.  Heart rate 70 bpm.  Blood pressure is 144/86.  Laboratory work reviewed.  Her last lipid panel September 2018 showed uncontrolled LDL at 104 mg/dL.    Cardiac procedures/studies: Procedures   Left Heart Cath and Coronary Angiography 12/26/2015  Conclusion     Prox RCA lesion, 25 %stenosed.  Ost 1st Diag lesion, 70 %stenosed.  Prox LAD lesion, 40 %stenosed.  Ost LAD lesion, 30 %stenosed.  The left ventricular systolic function is normal.  LV end diastolic pressure is normal.  The left ventricular ejection fraction is 55-65% by visual estimate.   Normal to hyperdynamic LV function with an ejection fraction of 65%.  Mild-to-moderate CAD with 30 and 40% proximal and mid LAD stenoses with 70-75% ostial stenosis in the first diagonal branch of the LAD; normal left circumflex coronary artery; and smooth 25% mid RCA stenosis in a dominant RCA vessel.  RECOMMENDATION: An initial increased initial medical  therapy trial is recommended.  On the patient's recent nuclear perfusion study, she was found to have possible ischemia in the inferior to inferolateral region and had relatively normal perfusion in the distribution of her stenosis involving the ostium of the diagonal vessel.  Nitrates and low-dose beta blocker will be added to her medical regimen.  In addition, Crestor will be titrated to 40 mg in attempt to be aggressive and induced potential plaque regression.    Echocardiogram June 02, 2017  Study Conclusions  - Left ventricle: The cavity size was normal. There was mild   concentric hypertrophy. Systolic function was vigorous. The   estimated ejection fraction was in the range of 65% to 70%. Wall   motion was normal; there were no regional wall motion   abnormalities. Doppler parameters are consistent with abnormal   left ventricular relaxation (grade 1 diastolic dysfunction).   There was no evidence of elevated ventricular filling pressure by   Doppler parameters. - Aortic valve: Trileaflet; moderately thickened, moderately   calcified leaflets. Transvalvular velocity was minimally   increased. There was very mild stenosis. There was no   regurgitation. - Mitral valve: There was no regurgitation. - Right ventricle: Systolic function was normal. - Tricuspid valve: There was mild regurgitation. - Pulmonary arteries: Systolic pressure was within the normal   range. - Inferior vena cava: The vessel was normal in size. - Pericardium, extracardiac: There was no pericardial effusion.  Impressions:  - Hyperdynamic LVEF 65-70% with mild intracavitary gradient during   systole.  Current Meds  Medication Sig  . AMITIZA 8 MCG capsule Take 1 tablet by mouth 2 (two) times daily.  Marland Kitchen aspirin EC 81 MG tablet Take 81 mg by mouth daily.  . Cyanocobalamin (B-12) 2500 MCG TABS Take 2,500 mcg by mouth daily.   . DULoxetine (CYMBALTA) 20 MG capsule Take 20 mg by mouth daily.  Marland Kitchen escitalopram  (LEXAPRO) 5 MG tablet Take 5 mg by mouth at bedtime.   . fluticasone (FLONASE) 50 MCG/ACT nasal spray Place 2 sprays into both nostrils daily as needed (seasonal allergies).   Marland Kitchen HYDROcodone-acetaminophen (NORCO) 10-325 MG tablet Take 1 tablet by mouth 4 (four) times daily as needed (pain).   . LORazepam (ATIVAN) 2 MG tablet Take 2 mg by mouth at bedtime.   Marland Kitchen losartan (COZAAR) 100 MG tablet Take 100 mg by mouth daily.  . Milnacipran HCl (SAVELLA) 100 MG TABS tablet Take 100 mg by mouth 2 (two) times daily.  Marland Kitchen MOVANTIK 25 MG TABS tablet Take 1 tablet by mouth daily.  Marland Kitchen omega-3 acid ethyl esters (LOVAZA) 1 g capsule Take 1 g by mouth daily.   . ondansetron (ZOFRAN-ODT) 4 MG disintegrating tablet Take 1 tablet (4 mg total) by mouth every 6 (six) hours as needed for nausea or vomiting.  . pantoprazole (PROTONIX) 40 MG tablet Take 40 mg by mouth daily.   Marland Kitchen rOPINIRole (REQUIP) 1 MG tablet Take 1-2 mg by mouth See admin instructions. Take 2 tablets (2 mg) by mouth daily at bedtime, may also take 1 tablet (1 mg) in the afternoon as needed for restless legs  . rosuvastatin (CRESTOR) 20 MG tablet Take 20 mg by mouth daily.  Marland Kitchen tiZANidine (ZANAFLEX) 4 MG  tablet Take 4 mg by mouth every 8 (eight) hours as needed for muscle spasms.   . [DISCONTINUED] losartan (COZAAR) 25 MG tablet Take 1 tablet (25 mg total) by mouth daily. (Patient taking differently: Take 50 mg by mouth daily. )   Allergies  Allergen Reactions  . Norvasc [Amlodipine Besylate] Swelling  . Phenergan [Promethazine] Other (See Comments)    Restlessness   . Timentin [Ticarcillin-Pot Clavulanate] Anaphylaxis    Has patient had a PCN reaction causing immediate rash, facial/tongue/throat swelling, SOB or lightheadedness with hypotension: Yes Has patient had a PCN reaction causing severe rash involving mucus membranes or skin necrosis: Yes Has patient had a PCN reaction that required hospitalization Yes Has patient had a PCN reaction occurring  within the last 10 years: No If all of the above answers are "NO", then may proceed with Cephalosporin use.   . Pregabalin Other (See Comments)    Mouth sores  . Reglan [Metoclopramide] Other (See Comments)    Cannot sit still   Past Medical History:  Diagnosis Date  . Anxiety   . Cancer of sigmoid ([facility])   . Depression (emotion)   . DJD (degenerative joint disease)   . Fibromyalgia   . GERD (gastroesophageal reflux disease)   . Hemorrhoids   . Hyperlipidemia   . Hypertension   . IBS (irritable bowel syndrome)   . MRSA (methicillin resistant Staphylococcus aureus) septicemia ([place])    history of  . Over weight   . Pancreatitis   . RLS (restless legs syndrome)   . Tachycardia    Family History  Problem Relation Age of Onset  . Hypertension Mother   . Heart disease Mother   . Heart attack Father   . Stroke Father   . Diabetes Sister   . Hypertension Sister   . Stroke Unknown   . Diabetes Unknown   . Heart disease Unknown    Past Surgical History:  Procedure Laterality Date  . CARDIAC CATHETERIZATION [name] 12/26/2015   Procedure: Left Heart Cath and Coronary Angiography;  Surgeon: Troy Sine, MD;  Location: [facility];  Service: Cardiovascular;  Laterality: [name];  . child birth     Social History   Socioeconomic History  . Marital status: Married    Spouse name: Not on file  . Number of children: Not on file  . Years of education: Not on file  . Highest education level: Not on file  Social Needs  . Financial resource strain: Not on file  . Food insecurity - worry: Not on file  . Food insecurity - inability: Not on file  . Transportation needs - medical: Not on file  . Transportation needs - non-medical: Not on file  Occupational History  . Not on file  Tobacco Use  . Smoking status: Never Smoker  . Smokeless tobacco: Never Used  Substance and Sexual Activity  . Alcohol use: No    Alcohol/week: 0.0 oz  . Drug use: No  . Sexual activity: Not on  file  Other Topics Concern  . Not on file  Social History Narrative  . Not on file     Review of Systems: General: negative for chills, fever, night sweats or weight changes.  Cardiovascular: negative for chest pain, dyspnea on exertion, edema, orthopnea, palpitations, paroxysmal nocturnal dyspnea or shortness of breath Dermatological: negative for rash Respiratory: negative for cough or wheezing Urologic: negative for hematuria Abdominal: negative for nausea, vomiting, diarrhea, bright red blood per rectum, melena, or hematemesis Neurologic:  negative for visual changes, syncope, or dizziness All other systems reviewed and are otherwise negative except as noted above.   Physical Exam:  Blood pressure (!) 144/86, pulse 70, height 5\' 7"  (1.702 m), weight 168 lb (76.2 kg).  General appearance: alert, cooperative and no distress Neck: no carotid bruit and no JVD Lungs: clear to auscultation bilaterally Heart: regular rate and rhythm, S1, S2 normal, no murmur, click, rub or gallop Extremities: extremities normal, atraumatic, no cyanosis or edema Pulses: 2+ and symmetric Skin: Skin color, texture, turgor normal. No rashes or lesions Neurologic: Grossly normal  EKG normal sinus rhythm.  No ischemic abnormalities.  70 bpm.-- personally reviewed   ASSESSMENT AND PLAN:   1.  CAD with atypical chest pain/ Chronic Exertional Dyspnea: Patient with mild to moderate CAD as outlined above.  She had a heart catheterization in 2017 which showed mild to moderate CAD with 30 and 40% proximal mid LAD stenosis and 70-75% ostial first diagonal, normal circumflex and smooth 25% mid RCA and a dominant RCA vessel, EF 55-65%. Given her stress test showed possible ischemia in the inferior to inferolateral region and had relatively normal perfusion in the distribution of her stenosis involving the ostium of the diagonal vessel, medical therapy was recommended. She was treated with aspirin, beta-blocker, nitrates  and statin therapy.  Her recent chest discomfort is atypical, only lasting a few seconds at a time and resolving spontaneously.  She denies any prolonged chest discomfort and has not required any use of nitroglycerin tablets given its relatively quick resolution.  She also denies any chest discomfort that is triggered by or worsened by exertion.  She does have exertional dyspnea that has been chronic ever since her heart catheterization without any change from baseline.  She denies resting dyspnea.  We will however adjust her antianginals to see if this results in any benefit.  Her resting heart rate is 70 bpm and blood pressure is 144/86.  We will further increase metoprolol to 75 mg twice daily.  We will also check a BNP and BMP.  Recent echocardiogram January 2019 which showed normal left ventricular EF and wall motion.  Cardiac pressures were normal.  Pulmonary artery pressure was normal.  Only trivial tricuspid regurgitation was noted. F/u in 3-4 weeks to reassess response to med titration.   2.  HTN: controlled on current regimen.  However we will increase her metoprolol given her recent symptoms.   3.  Hyperlipidemia: Lipid panel September 2018 showed uncontrolled LDL 104 mg/dL.  LDL goal in the setting of CAD is less than 70.  She reports full medication compliance with Crestor.  We will recheck a fasting lipid panel and if not below 70 we will need to change her regimen.  Patient in the past was unable to tolerate higher dose of Crestor due to myalgias.  If LDL is uncontrolled, we will need to try a different statin such as Lipitor.  If unable to get levels under control w/ statin therapy, then we will need to refer to lipid clinic for consideration of PCSK 9 inhibitors.  Follow-Up in 3-4 weeks.   Baker Kogler Ladoris Gene, MHS [facility] 08/19/2017 11:50 AM

## 2017-09-07 ENCOUNTER — Other Ambulatory Visit: Payer: Medicare Other

## 2017-09-11 DIAGNOSIS — M5030 Other cervical disc degeneration, unspecified cervical region: Secondary | ICD-10-CM | POA: Diagnosis not present

## 2017-09-11 DIAGNOSIS — G894 Chronic pain syndrome: Secondary | ICD-10-CM | POA: Diagnosis not present

## 2017-09-11 DIAGNOSIS — M5412 Radiculopathy, cervical region: Secondary | ICD-10-CM | POA: Diagnosis not present

## 2017-09-11 DIAGNOSIS — Z79899 Other long term (current) drug therapy: Secondary | ICD-10-CM | POA: Diagnosis not present

## 2017-09-11 DIAGNOSIS — Z79891 Long term (current) use of opiate analgesic: Secondary | ICD-10-CM | POA: Diagnosis not present

## 2017-09-11 DIAGNOSIS — M961 Postlaminectomy syndrome, not elsewhere classified: Secondary | ICD-10-CM | POA: Diagnosis not present

## 2017-09-14 DIAGNOSIS — D126 Benign neoplasm of colon, unspecified: Secondary | ICD-10-CM | POA: Diagnosis not present

## 2017-09-14 DIAGNOSIS — Z85038 Personal history of other malignant neoplasm of large intestine: Secondary | ICD-10-CM | POA: Diagnosis not present

## 2017-09-16 DIAGNOSIS — D126 Benign neoplasm of colon, unspecified: Secondary | ICD-10-CM | POA: Diagnosis not present

## 2017-09-21 ENCOUNTER — Other Ambulatory Visit: Payer: Medicare Other | Admitting: *Deleted

## 2017-09-21 ENCOUNTER — Ambulatory Visit: Payer: Medicare Other | Admitting: Cardiology

## 2017-09-21 DIAGNOSIS — E785 Hyperlipidemia, unspecified: Secondary | ICD-10-CM

## 2017-09-21 DIAGNOSIS — R0602 Shortness of breath: Secondary | ICD-10-CM | POA: Diagnosis not present

## 2017-09-21 LAB — LIPID PANEL
CHOL/HDL RATIO: 5.2 ratio — AB (ref 0.0–4.4)
Cholesterol, Total: 227 mg/dL — ABNORMAL HIGH (ref 100–199)
HDL: 44 mg/dL (ref >39)
LDL Calculated: 149 mg/dL — ABNORMAL HIGH (ref 0–99)
Triglycerides: 172 mg/dL — ABNORMAL HIGH (ref 0–149)
VLDL Cholesterol Cal: 34 mg/dL (ref 5–40)

## 2017-09-21 LAB — HEPATIC FUNCTION PANEL
ALBUMIN: 4.3 g/dL (ref 3.5–4.8)
ALK PHOS: 97 IU/L (ref 39–117)
ALT: 10 IU/L (ref 0–32)
AST: 11 IU/L (ref 0–40)
BILIRUBIN, DIRECT: 0.06 mg/dL (ref 0.00–0.40)
TOTAL PROTEIN: 6.6 g/dL (ref 6.0–8.5)

## 2017-09-21 LAB — BASIC METABOLIC PANEL
BUN / CREAT RATIO: 11 — AB (ref 12–28)
BUN: 12 mg/dL (ref 8–27)
CHLORIDE: 104 mmol/L (ref 96–106)
CO2: 24 mmol/L (ref 20–29)
CREATININE: 1.08 mg/dL — AB (ref 0.57–1.00)
Calcium: 9.7 mg/dL (ref 8.7–10.3)
GFR, EST AFRICAN AMERICAN: 60 mL/min/{1.73_m2} (ref >59)
GFR, EST NON AFRICAN AMERICAN: 52 mL/min/{1.73_m2} — AB (ref >59)
Glucose: 102 mg/dL — ABNORMAL HIGH (ref 65–99)
Potassium: 4.2 mmol/L (ref 3.5–5.2)
Sodium: 142 mmol/L (ref 134–144)

## 2017-09-21 LAB — PRO B NATRIURETIC PEPTIDE: NT-PRO BNP: 402 pg/mL — AB (ref 0–301)

## 2017-09-22 ENCOUNTER — Telehealth: Payer: Self-pay | Admitting: *Deleted

## 2017-09-22 DIAGNOSIS — Z79899 Other long term (current) drug therapy: Secondary | ICD-10-CM

## 2017-09-22 MED ORDER — FUROSEMIDE 20 MG PO TABS: 20.0000 mg | ORAL_TABLET | Freq: Every day | ORAL | 3 refills | Status: DC

## 2017-09-22 MED ORDER — POTASSIUM CHLORIDE ER 10 MEQ PO TBCR: 10.0000 meq | EXTENDED_RELEASE_TABLET | Freq: Every day | ORAL | 3 refills | Status: DC

## 2017-09-22 NOTE — Telephone Encounter (Signed)
-----   Message from Consuelo Pandy, [place] sent at 09/22/2017  4:27 PM EDT ----- BNP (fluid marker) is mildly elevated. This may be the cause of her shortness of breath. I recommend starting a low dose fluid pill to help with this. Add Lasix 20 mg once daily as well as supplemental potassium, 10 Meq daily. Repeat BMP and BNP in 7 days. Keep up coming appt to reassess response to treatment.

## 2017-09-30 ENCOUNTER — Other Ambulatory Visit: Payer: Medicare Other

## 2017-10-01 ENCOUNTER — Other Ambulatory Visit: Payer: Medicare Other | Admitting: *Deleted

## 2017-10-01 ENCOUNTER — Ambulatory Visit: Payer: Medicare Other | Admitting: Cardiology

## 2017-10-01 DIAGNOSIS — M545 Low back pain: Secondary | ICD-10-CM | POA: Diagnosis not present

## 2017-10-01 DIAGNOSIS — Z79899 Other long term (current) drug therapy: Secondary | ICD-10-CM

## 2017-10-01 DIAGNOSIS — R0600 Dyspnea, unspecified: Secondary | ICD-10-CM | POA: Diagnosis not present

## 2017-10-01 DIAGNOSIS — M79609 Pain in unspecified limb: Secondary | ICD-10-CM | POA: Diagnosis not present

## 2017-10-01 LAB — PRO B NATRIURETIC PEPTIDE: NT-Pro BNP: 274 pg/mL (ref 0–301)

## 2017-10-01 LAB — BASIC METABOLIC PANEL
BUN / CREAT RATIO: 18 (ref 12–28)
BUN: 19 mg/dL (ref 8–27)
CALCIUM: 9.7 mg/dL (ref 8.7–10.3)
CO2: 26 mmol/L (ref 20–29)
CREATININE: 1.06 mg/dL — AB (ref 0.57–1.00)
Chloride: 103 mmol/L (ref 96–106)
GFR calc Af Amer: 61 mL/min/{1.73_m2} (ref >59)
GFR calc non Af Amer: 53 mL/min/{1.73_m2} — ABNORMAL LOW (ref >59)
GLUCOSE: 107 mg/dL — AB (ref 65–99)
Potassium: 5.5 mmol/L — ABNORMAL HIGH (ref 3.5–5.2)
Sodium: 143 mmol/L (ref 134–144)

## 2017-10-06 ENCOUNTER — Telehealth: Payer: Self-pay | Admitting: *Deleted

## 2017-10-06 NOTE — Telephone Encounter (Signed)
-----   Message from Consuelo Pandy, [place] sent at 10/02/2017  1:03 PM EDT ----- BNP (fluid marker) is better and back to normal range. Renal function is stable after starting low dose Lasix. She can continue to take Lasix daily but instruct pt to STOP taking potassium supplement, as her level is mildly elevated.

## 2017-10-06 NOTE — Telephone Encounter (Signed)
Notified the pt that per Ellen Henri PA-C, her labs showed that her BNP is back to normal, renal fx is stable after starting low dose lasix, so she recommends that she continue to take lasix, but she needs to STOP taking her Potassium supplement.  Informed the pt that [place] wants her to stop KDUR, because her level is mildly elevated.  Discontinued the pts KDUR in her chart.  Pt verbalized understanding and agrees with this plan.

## 2017-10-10 DIAGNOSIS — H6091 Unspecified otitis externa, right ear: Secondary | ICD-10-CM | POA: Diagnosis not present

## 2017-10-10 DIAGNOSIS — H6691 Otitis media, unspecified, right ear: Secondary | ICD-10-CM | POA: Diagnosis not present

## 2017-10-12 DIAGNOSIS — G894 Chronic pain syndrome: Secondary | ICD-10-CM | POA: Diagnosis not present

## 2017-10-12 DIAGNOSIS — M5030 Other cervical disc degeneration, unspecified cervical region: Secondary | ICD-10-CM | POA: Diagnosis not present

## 2017-10-12 DIAGNOSIS — M961 Postlaminectomy syndrome, not elsewhere classified: Secondary | ICD-10-CM | POA: Diagnosis not present

## 2017-10-12 DIAGNOSIS — M5412 Radiculopathy, cervical region: Secondary | ICD-10-CM | POA: Diagnosis not present

## 2017-10-15 DIAGNOSIS — I2510 Atherosclerotic heart disease of native coronary artery without angina pectoris: Secondary | ICD-10-CM | POA: Diagnosis not present

## 2017-10-15 DIAGNOSIS — E785 Hyperlipidemia, unspecified: Secondary | ICD-10-CM | POA: Diagnosis not present

## 2017-10-15 DIAGNOSIS — I10 Essential (primary) hypertension: Secondary | ICD-10-CM | POA: Diagnosis not present

## 2017-10-15 DIAGNOSIS — M797 Fibromyalgia: Secondary | ICD-10-CM | POA: Diagnosis not present

## 2017-10-15 DIAGNOSIS — R110 Nausea: Secondary | ICD-10-CM | POA: Diagnosis not present

## 2017-10-28 ENCOUNTER — Encounter: Payer: Self-pay | Admitting: Cardiology

## 2017-10-28 ENCOUNTER — Ambulatory Visit (INDEPENDENT_AMBULATORY_CARE_PROVIDER_SITE_OTHER): Payer: Medicare Other | Admitting: Cardiology

## 2017-10-28 VITALS — BP 184/104 | HR 71 | Ht 66.0 in | Wt 172.8 lb

## 2017-10-28 DIAGNOSIS — Z79899 Other long term (current) drug therapy: Secondary | ICD-10-CM

## 2017-10-28 LAB — BASIC METABOLIC PANEL
BUN/Creatinine Ratio: 11 — ABNORMAL LOW (ref 12–28)
BUN: 11 mg/dL (ref 8–27)
CALCIUM: 9.6 mg/dL (ref 8.7–10.3)
CO2: 26 mmol/L (ref 20–29)
CREATININE: 1.01 mg/dL — AB (ref 0.57–1.00)
Chloride: 104 mmol/L (ref 96–106)
GFR calc Af Amer: 65 mL/min/{1.73_m2} (ref >59)
GFR calc non Af Amer: 56 mL/min/{1.73_m2} — ABNORMAL LOW (ref >59)
GLUCOSE: 88 mg/dL (ref 65–99)
Potassium: 4.2 mmol/L (ref 3.5–5.2)
SODIUM: 144 mmol/L (ref 134–144)

## 2017-10-28 MED ORDER — DILTIAZEM HCL 120 MG PO TABS: 120.0000 mg | ORAL_TABLET | Freq: Every day | ORAL | 3 refills | Status: DC

## 2017-10-28 NOTE — Patient Instructions (Addendum)
Medication Instructions:   START TAKING CARDIZEM  120 MG ONCE A DAY   If you need a refill on your cardiac medications before your next appointment, please call your pharmacy.  Labwork:  BMET TODAY    Testing/Procedures: NONE ORDERED  TODAY    Follow-Up: IN 2 TO 3 WEEKS WITH HYPERTENSION CLNIC    Any Other Special Instructions Will Be Listed Below (If Applicable).  YOU HAVE BEEN RECOMMENDED TO CUT BACK ON CAFFEINE AND WATCH  SALT INTAKE  MAKE SURE READ LABELS.  SHOULD HAVE 2000 MG DAILY SALT INTAKE  Hypertension Hypertension, commonly called high blood pressure, is when the force of blood pumping through the arteries is too strong. The arteries are the blood vessels that carry blood from the heart throughout the body. Hypertension forces the heart to work harder to pump blood and may cause arteries to become narrow or stiff. Having untreated or uncontrolled hypertension can cause heart attacks, strokes, kidney disease, and other problems. A blood pressure reading consists of a higher number over a lower number. Ideally, your blood pressure should be below 120/80. The first ("top") number is called the systolic pressure. It is a measure of the pressure in your arteries as your heart beats. The second ("bottom") number is called the diastolic pressure. It is a measure of the pressure in your arteries as the heart relaxes. What are the causes? The cause of this condition is not known. What increases the risk? Some risk factors for high blood pressure are under your control. Others are not. Factors you can change  Smoking.  Having type 2 diabetes mellitus, high cholesterol, or both.  Not getting enough exercise or physical activity.  Being overweight.  Having too much fat, sugar, calories, or salt (sodium) in your diet.  Drinking too much alcohol. Factors that are difficult or impossible to change  Having chronic kidney disease.  Having a family history of high blood  pressure.  Age. Risk increases with age.  Race. You may be at higher risk if you are African-American.  Gender. Men are at higher risk than women before age 75. After age 66, women are at higher risk than men.  Having obstructive sleep apnea.  Stress. What are the signs or symptoms? Extremely high blood pressure (hypertensive crisis) may cause:  Headache.  Anxiety.  Shortness of breath.  Nosebleed.  Nausea and vomiting.  Severe chest pain.  Jerky movements you cannot control (seizures).  How is this diagnosed? This condition is diagnosed by measuring your blood pressure while you are seated, with your arm resting on a surface. The cuff of the blood pressure monitor will be placed directly against the skin of your upper arm at the level of your heart. It should be measured at least twice using the same arm. Certain conditions can cause a difference in blood pressure between your right and left arms. Certain factors can cause blood pressure readings to be lower or higher than normal (elevated) for a short period of time:  When your blood pressure is higher when you are in a health care provider's office than when you are at home, this is called white coat hypertension. Most people with this condition do not need medicines.  When your blood pressure is higher at home than when you are in a health care provider's office, this is called masked hypertension. Most people with this condition may need medicines to control blood pressure.  If you have a high blood pressure reading during one visit  or you have normal blood pressure with other risk factors:  You may be asked to return on a different day to have your blood pressure checked again.  You may be asked to monitor your blood pressure at home for 1 week or longer.  If you are diagnosed with hypertension, you may have other blood or imaging tests to help your health care provider understand your overall risk for other  conditions. How is this treated? This condition is treated by making healthy lifestyle changes, such as eating healthy foods, exercising more, and reducing your alcohol intake. Your health care provider may prescribe medicine if lifestyle changes are not enough to get your blood pressure under control, and if:  Your systolic blood pressure is above 130.  Your diastolic blood pressure is above 80.  Your personal target blood pressure may vary depending on your medical conditions, your age, and other factors. Follow these instructions at home: Eating and drinking  Eat a diet that is high in fiber and potassium, and low in sodium, added sugar, and fat. An example eating plan is called the DASH (Dietary Approaches to Stop Hypertension) diet. To eat this way: ? Eat plenty of fresh fruits and vegetables. Try to fill half of your plate at each meal with fruits and vegetables. ? Eat whole grains, such as whole wheat pasta, brown rice, or whole grain bread. Fill about one quarter of your plate with whole grains. ? Eat or drink low-fat dairy products, such as skim milk or low-fat yogurt. ? Avoid fatty cuts of meat, processed or cured meats, and poultry with skin. Fill about one quarter of your plate with lean proteins, such as fish, chicken without skin, beans, eggs, and tofu. ? Avoid premade and processed foods. These tend to be higher in sodium, added sugar, and fat.  Reduce your daily sodium intake. Most people with hypertension should eat less than 1,500 mg of sodium a day.  Limit alcohol intake to no more than 1 drink a day for nonpregnant women and 2 drinks a day for men. One drink equals 12 oz of beer, 5 oz of wine, or 1 oz of hard liquor. Lifestyle  Work with your health care provider to maintain a healthy body weight or to lose weight. Ask what an ideal weight is for you.  Get at least 30 minutes of exercise that causes your heart to beat faster (aerobic exercise) most days of the week.  Activities may include walking, swimming, or biking.  Include exercise to strengthen your muscles (resistance exercise), such as pilates or lifting weights, as part of your weekly exercise routine. Try to do these types of exercises for 30 minutes at least 3 days a week.  Do not use any products that contain nicotine or tobacco, such as cigarettes and e-cigarettes. If you need help quitting, ask your health care provider.  Monitor your blood pressure at home as told by your health care provider.  Keep all follow-up visits as told by your health care provider. This is important. Medicines  Take over-the-counter and prescription medicines only as told by your health care provider. Follow directions carefully. Blood pressure medicines must be taken as prescribed.  Do not skip doses of blood pressure medicine. Doing this puts you at risk for problems and can make the medicine less effective.  Ask your health care provider about side effects or reactions to medicines that you should watch for. Contact a health care provider if:  You think you are having  a reaction to a medicine you are taking.  You have headaches that keep coming back (recurring).  You feel dizzy.  You have swelling in your ankles.  You have trouble with your vision. Get help right away if:  You develop a severe headache or confusion.  You have unusual weakness or numbness.  You feel faint.  You have severe pain in your chest or abdomen.  You vomit repeatedly.  You have trouble breathing. Summary  Hypertension is when the force of blood pumping through your arteries is too strong. If this condition is not controlled, it may put you at risk for serious complications.  Your personal target blood pressure may vary depending on your medical conditions, your age, and other factors. For most people, a normal blood pressure is less than 120/80.  Hypertension is treated with lifestyle changes, medicines, or a  combination of both. Lifestyle changes include weight loss, eating a healthy, low-sodium diet, exercising more, and limiting alcohol. This information is not intended to replace advice given to you by your health care provider. Make sure you discuss any questions you have with your health care provider. Document Released: 05/19/2005 Document Revised: 04/16/2016 Document Reviewed: 04/16/2016 Elsevier Interactive Patient Education  [employer].

## 2017-10-28 NOTE — Progress Notes (Signed)
10/28/2017 Deveron Furlong   01-29-1946  [id number]  Primary Physician [name], MD Primary Cardiologist: Dr.  [name]  Reason for Visit/CC: Dyspnea and HTN  HPI:  Angela [name] is a 72 y.o. female who is being seen today for f/u for dyspnea and HTN.   She has known coronary disease which is been treated medically.  Has a history of hypertension, hyperlipidemia as well as prior history of gastroparesis, however no history of diabetes.  She has been evaluated by GI for this.  In 2017, she underwent workup for exertional chest pain. NST was abnormal withpossible ischemia in the inferior to inferolateral region. Cardiac catheterization revealed mild to moderate CAD with 30 and 40% proximal mid LAD stenosis and 70-75% ostial first diagonal, normal circumflex and smooth 25% mid RCA and a dominant RCA vessel, EF 55-65%.Given her stress test showedpossible ischemia in the inferior to inferolateral region and had relatively normal perfusion in the distribution of her stenosis involving the ostium of the diagonal vessel, medical therapy was recommended. Nitrates and low dose BB was added to her regimen. Crestor was increased to 40 mg, however she did not tolerate the higher dose due to myalgias. This was reduced back down to 20 mg.   In Dec. 2018, she was readmitted to the hospital under the hospitalist service for recurrent gastroparesis/ delayed gastric emptying.  During that time, she was also noted to have significant hypertension/hypertensive emergency.  Following the hospitalization her PCP further increased her losartan to 100 mg daily.  An echocardiogram was obtained around that time, in December 2018, which showed normal left ventricular EF and normal wall motion with grade 1 diastolic dysfunction.  Trivial tricuspid regurgitation was noted and pulmonary artery pressure was normal.  She was seen in our clinic August 19, 2017 with complaints of atypical chest pain and chronic exertional  dyspnea.  CP was felt to be atypical, only lasting a few seconds at a time and resolving spontaneously.  She denied any prolonged chest discomfort and had not required any use of nitroglycerin tablets given its relatively quick resolution. She also denied any exertional chest pain, but did have exertional dyspnea that was however noted to be chronic and unchanged since her heart catheterization in 2017.  At her last office visit, her resting heart rate was in the 70s and blood pressure was 144/86.  We elected to further increase her metoprolol to 75 mg twice daily.  We also checked a pro BNP which was noted to be slightly elevated at 402.  Given her exertional dyspnea, we elected to add a low-dose fluid pill.  She was started on Lasix 20 mg once daily along with 10 mEq of potassium.  Repeat labs obtained Oct 01, 2017 showed pro BNP to be improved and back within normal range to 274.  Repeat BMP after starting diuretic showed stable renal function with serum creatinine at 1.06, unchanged from previous, however she was noted to be hyperkalemic with potassium level 5.5.  Subsequently, she was instructed to discontinue supplemental potassium.  She is back in clinic today for follow-up.  Today in follow-up, the patient reports that her blood pressure remains an issue.  It is elevated today at 184/104.  She has been checking her blood pressure regularly at home and has had persistent hypertension with systolic blood pressures in the 150s to 355D and diastolic blood pressures in the 90s to low 100s.  She feels tired.  She continues to have mild exertional dyspnea, without any  significant improvement after addition of Lasix.  She was seen by her PCP last week and they added low-dose hydrochlorothiazide, 12.5 mg daily to her regimen.  PCP was fully aware that she was already on low-dose Lasix.  Will need a follow-up BMP today.    We discussed dietary triggers.  She admits that she does eat a high sodium diet.  She seasons  in her food with salt and pepper at home.  She also eats out at restaurants usually, 1 to 2 days a week.  She also consumes a moderate amount of caffeine.  She drinks on average 2 to 3 cups of regular coffee daily and typically drinks 1 glass of tea daily.  She denies regular use of NSAIDs.  She denies tobacco use.  She does report a history of snoring but has never had a sleep study.  Meds were reviewed.  She was previously on amlodipine but was discontinued secondary to intolerance/significant lower extremity edema.  On metoprolol twice daily.  We considered switching her to carvedilol however the patient had concerns regarding her history of tachycardia.  Would prefer to remain on metoprolol given its better effects on heart rate versus carvedilol.  She is maxed out on losartan.  On low-dose Imdur, low-dose Lasix and low-dose hydrochlorothiazide.  She reports full medication compliance.   No outpatient medications have been marked as taking for the 10/28/17 encounter (Appointment) with Angela Pandy, PA-C.   Allergies  Allergen Reactions  . Norvasc [Amlodipine Besylate] Swelling  . Phenergan [Promethazine] Other (See Comments)    Restlessness   . Timentin [Ticarcillin-Pot Clavulanate] Anaphylaxis    Has patient had a PCN reaction causing immediate rash, facial/tongue/throat swelling, SOB or lightheadedness with hypotension: Yes Has patient had a PCN reaction causing severe rash involving mucus membranes or skin necrosis: Yes Has patient had a PCN reaction that required hospitalization Yes Has patient had a PCN reaction occurring within the last 10 years: No If all of the above answers are "NO", then may proceed with Cephalosporin use.   . Pregabalin Other (See Comments)    Mouth sores  . Reglan [Metoclopramide] Other (See Comments)    Cannot sit still   Past Medical History:  Diagnosis Date  . Anxiety   . Cancer of sigmoid ([place])   . Depression (emotion)   . DJD (degenerative  joint disease)   . Fibromyalgia   . GERD (gastroesophageal reflux disease)   . Hemorrhoids   . Hyperlipidemia   . Hypertension   . IBS (irritable bowel syndrome)   . MRSA (methicillin resistant Staphylococcus aureus) septicemia ([place])    history of  . Over weight   . Pancreatitis   . RLS (restless legs syndrome)   . Tachycardia    Family History  Problem Relation Age of Onset  . Hypertension Mother   . Heart disease Mother   . Heart attack Father   . Stroke Father   . Diabetes Sister   . Hypertension Sister   . Stroke Unknown   . Diabetes Unknown   . Heart disease Unknown    Past Surgical History:  Procedure Laterality Date  . CARDIAC CATHETERIZATION [name] 12/26/2015   Procedure: Left Heart Cath and Coronary Angiography;  Surgeon: Troy Sine, MD;  Location: [facility];  Service: Cardiovascular;  Laterality: [name];  . child birth     Social History   Socioeconomic History  . Marital status: Married    Spouse name: Not on file  . Number  of children: Not on file  . Years of education: Not on file  . Highest education level: Not on file  Occupational History  . Not on file  Social Needs  . Financial resource strain: Not on file  . Food insecurity:    Worry: Not on file    Inability: Not on file  . Transportation needs:    Medical: Not on file    Non-medical: Not on file  Tobacco Use  . Smoking status: Never Smoker  . Smokeless tobacco: Never Used  Substance and Sexual Activity  . Alcohol use: No    Alcohol/week: 0.0 oz  . Drug use: No  . Sexual activity: Not on file  Lifestyle  . Physical activity:    Days per week: Not on file    Minutes per session: Not on file  . Stress: Not on file  Relationships  . Social connections:    Talks on phone: Not on file    Gets together: Not on file    Attends religious service: Not on file    Active member of club or organization: Not on file    Attends meetings of clubs or organizations: Not on file     Relationship status: Not on file  . Intimate partner violence:    Fear of current or ex partner: Not on file    Emotionally abused: Not on file    Physically abused: Not on file    Forced sexual activity: Not on file  Other Topics Concern  . Not on file  Social History Narrative  . Not on file     Review of Systems: General: negative for chills, fever, night sweats or weight changes.  Cardiovascular: negative for chest pain, dyspnea on exertion, edema, orthopnea, palpitations, paroxysmal nocturnal dyspnea or shortness of breath Dermatological: negative for rash Respiratory: negative for cough or wheezing Urologic: negative for hematuria Abdominal: negative for nausea, vomiting, diarrhea, bright red blood per rectum, melena, or hematemesis Neurologic: negative for visual changes, syncope, or dizziness All other systems reviewed and are otherwise negative except as noted above.   Physical Exam:  There were no vitals taken for this visit.  General appearance: alert, cooperative and no distress Neck: no carotid bruit and no JVD Lungs: clear to auscultation bilaterally Heart: regular rate and rhythm, S1, S2 normal, no murmur, click, rub or gallop Extremities: extremities normal, atraumatic, no cyanosis or edema Pulses: 2+ and symmetric Skin: Skin color, texture, turgor normal. No rashes or lesions Neurologic: Grossly normal  EKG not performed -- personally reviewed   ASSESSMENT AND PLAN:   1.  Hypertension: Poorly controlled despite full medication compliance.  She is on multiple antihypertensive medications.  We discussed dietary factors.  She admits to high sodium diet and consumes a moderate amount of caffeine.  We discussed restriction of salt as well as caffeine.  Patient also encouraged to increase physical activity.  I recommended adopting a walking regimen, at least 30 minutes daily.  In the meantime, we will adjust her medical regimen.  I feel that she would benefit from  addition of a calcium channel blocker.  Unfortunately she has had intolerances to Norvasc in the past due to swelling.  We will try adding 120 mg of Cardizem daily to her regimen.  Continue metoprolol, losartan, Imdur, Lasix and low-dose hydrochlorothiazide.  We will obtain repeat BMP today to check renal function and electrolytes, after her PCP recently added hydrochlorothiazide.  Will have  her follow-up in the hypertension clinic again  for repeat evaluation in 2 to 3-weeks.  If she continues to have difficult to control hypertension despite multiple antihypertensive agents then we will need to consider bilateral renal artery Doppler studies.   2.  CAD:  Mild to moderate CAD. She had a heart catheterization in 2017 which showed mild to moderate CAD with 30 and 40% proximal mid LAD stenosis and 70-75% ostial first diagonal, normal circumflex and smooth 25% mid RCA and a dominant RCA vessel, EF 55-65%.Given her stress test showedpossible ischemia in the inferior to inferolateral region and had relatively normal perfusion in the distribution of her stenosis involving the ostium of the diagonal vessel, medical therapy was recommended. She was treated with aspirin, beta-blocker, nitrates and statin therapy.  No ischemic symptoms.  Her blood pressure is poorly controlled. Adjusting meds as outlined above. Adding Cardizem.   3. HLD: last lipid panel 08/2017 showed elevated LDL at 149 mg/dL. She has been intolerant to statins due to myalgias. Recently tried Crestor. Intolerant. Also failed Lipitor.  Will refer to lipid clinic for PCSK9 inhibitor therapy.  Follow-Up: Next f/u>> HTN clinic in 2-3 weeks.   Yusra Ravert Ladoris Gene, MHS [facility] 10/28/2017 9:48 AM

## 2017-11-10 DIAGNOSIS — M79651 Pain in right thigh: Secondary | ICD-10-CM | POA: Diagnosis not present

## 2017-11-10 DIAGNOSIS — M47817 Spondylosis without myelopathy or radiculopathy, lumbosacral region: Secondary | ICD-10-CM | POA: Diagnosis not present

## 2017-11-10 DIAGNOSIS — Z79891 Long term (current) use of opiate analgesic: Secondary | ICD-10-CM | POA: Diagnosis not present

## 2017-11-10 DIAGNOSIS — Z79899 Other long term (current) drug therapy: Secondary | ICD-10-CM | POA: Diagnosis not present

## 2017-11-10 DIAGNOSIS — G894 Chronic pain syndrome: Secondary | ICD-10-CM | POA: Diagnosis not present

## 2017-11-10 DIAGNOSIS — M5412 Radiculopathy, cervical region: Secondary | ICD-10-CM | POA: Diagnosis not present

## 2017-11-25 ENCOUNTER — Ambulatory Visit: Payer: Medicare Other

## 2017-11-25 NOTE — Progress Notes (Deleted)
Patient ID: Angela [name]                 DOB: 1946-03-29                    MRN: [id number]     HPI: Angela [name] is a 72 y.o. female patient of Dr. [name] that presents today for lipid evaluation and blood pressure management.  PMH includes known coronary disease which is been treated medically. Has a history of hypertension, hyperlipidemia as well as prior history of gastroparesis,howeverno history of diabetes. She has previously been seen in pharmacy clinic for lipids and blood pressure. At her most recent OV she was restarted on diltiazem.   She presents today for discussion of lipids and blood pressure.    Risk Factors: CAD, HTN LDL Goal: <70, nonHDL <100  Current BP medications: dilitazem 120mg  daily, furosemide 20mg  daily, HCTZ 12.5mg  daily, isosorbide mononitrate 30mg  daily, losartan 100mg  daily, metoprolol 75mg  BID  BP intolerances: norvasc - swelling, dilt stopped during hosp admission for GI issues  Current Lipid Medications: Lovaza 1g daily  Intolerances: rosuvastatin 10mg , 20mg  and 40mg  daily, Lipitor (muscle pains)  Diet:   Exercise: Minimal meat consumption, does snack frequently throughout the day  Family History: DM in her sister, MI in her father, heart disease in her mother, HTN in her mother and sister, stroke in her father.  Social History: denies tobacco and alcohol  Labs: 09/21/17:  TC 227, TG 172, HDL 44, LDL 149 (no therapy) 02/20/16: TC 150, TG 112, HDL 50, LDL 78 (Crestor 10mg  daily)  Home BP:   Past Medical History:  Diagnosis Date  . Anxiety   . Cancer of sigmoid ([place])   . Depression (emotion)   . DJD (degenerative joint disease)   . Fibromyalgia   . GERD (gastroesophageal reflux disease)   . Hemorrhoids   . Hyperlipidemia   . Hypertension   . IBS (irritable bowel syndrome)   . MRSA (methicillin resistant Staphylococcus aureus) septicemia ([place])    history of  . Over weight   . Pancreatitis   . RLS (restless legs syndrome)   .  Tachycardia     Current Outpatient Medications on File Prior to Visit  Medication Sig Dispense Refill  . AMITIZA 8 MCG capsule Take 1 tablet by mouth 2 (two) times daily.    Marland Kitchen aspirin EC 81 MG tablet Take 81 mg by mouth daily.    . Cyanocobalamin (B-12) 2500 MCG TABS Take 2,500 mcg by mouth daily.     Marland Kitchen diltiazem (CARDIZEM) 120 MG tablet Take 1 tablet (120 mg total) by mouth daily. 30 tablet 3  . DULoxetine (CYMBALTA) 20 MG capsule Take 20 mg by mouth daily.    Marland Kitchen escitalopram (LEXAPRO) 5 MG tablet Take 5 mg by mouth at bedtime.     . fluticasone (FLONASE) 50 MCG/ACT nasal spray Place 2 sprays into both nostrils daily as needed (seasonal allergies).     . furosemide (LASIX) 20 MG tablet Take 1 tablet (20 mg total) by mouth daily. 90 tablet 3  . hydrochlorothiazide (HYDRODIURIL) 25 MG tablet Take 12.5 mg by mouth daily.    Marland Kitchen HYDROcodone-acetaminophen (NORCO) 10-325 MG tablet Take 1 tablet by mouth 4 (four) times daily as needed (pain).     . isosorbide mononitrate (IMDUR) 30 MG 24 hr tablet Take 1 tablet (30 mg total) by mouth daily. 90 tablet 1  . LORazepam (ATIVAN) 2 MG tablet Take 2 mg by mouth at  bedtime.     Marland Kitchen losartan (COZAAR) 100 MG tablet Take 100 mg by mouth daily.    . metoprolol tartrate (LOPRESSOR) 50 MG tablet Take 1.5 tablets (75 mg total) by mouth 2 (two) times daily. 270 tablet 1  . Milnacipran HCl (SAVELLA) 100 MG TABS tablet Take 100 mg by mouth 2 (two) times daily.    Marland Kitchen MOVANTIK 25 MG TABS tablet Take 1 tablet by mouth daily.    . nitroGLYCERIN (NITROSTAT) 0.4 MG SL tablet Place 1 tablet (0.4 mg total) under the tongue every 5 (five) minutes as needed for chest pain. 25 tablet 3  . omega-3 acid ethyl esters (LOVAZA) 1 g capsule Take 1 g by mouth daily.     . ondansetron (ZOFRAN-ODT) 4 MG disintegrating tablet Take 1 tablet (4 mg total) by mouth every 6 (six) hours as needed for nausea or vomiting. 30 tablet 0  . pantoprazole (PROTONIX) 40 MG tablet Take 40 mg by mouth daily.      Marland Kitchen rOPINIRole (REQUIP) 1 MG tablet Take 1-2 mg by mouth See admin instructions. Take 2 tablets (2 mg) by mouth daily at bedtime, may also take 1 tablet (1 mg) in the afternoon as needed for restless legs    . tiZANidine (ZANAFLEX) 4 MG tablet Take 4 mg by mouth every 8 (eight) hours as needed for muscle spasms.      No current facility-administered medications on file prior to visit.     Allergies  Allergen Reactions  . Norvasc [Amlodipine Besylate] Swelling  . Phenergan [Promethazine] Other (See Comments)    Restlessness   . Timentin [Ticarcillin-Pot Clavulanate] Anaphylaxis    Has patient had a PCN reaction causing immediate rash, facial/tongue/throat swelling, SOB or lightheadedness with hypotension: Yes Has patient had a PCN reaction causing severe rash involving mucus membranes or skin necrosis: Yes Has patient had a PCN reaction that required hospitalization Yes Has patient had a PCN reaction occurring within the last 10 years: No If all of the above answers are "NO", then may proceed with Cephalosporin use.   . Pregabalin Other (See Comments)    Mouth sores  . Reglan [Metoclopramide] Other (See Comments)    Cannot sit still    Assessment/Plan: Hyperlipidemia: LDL not at goal. Pt unable to tolerate statin medications  Hypertension: change metoprolol to carvedilol , savella increasing pressures??   Thank you,  Lelan Pons. Patterson Hammersmith, [facility]  11/25/2017 7:14 AM

## 2017-12-09 DIAGNOSIS — M5116 Intervertebral disc disorders with radiculopathy, lumbar region: Secondary | ICD-10-CM | POA: Diagnosis not present

## 2017-12-09 DIAGNOSIS — M5030 Other cervical disc degeneration, unspecified cervical region: Secondary | ICD-10-CM | POA: Diagnosis not present

## 2017-12-09 DIAGNOSIS — M4726 Other spondylosis with radiculopathy, lumbar region: Secondary | ICD-10-CM | POA: Diagnosis not present

## 2017-12-09 DIAGNOSIS — M4316 Spondylolisthesis, lumbar region: Secondary | ICD-10-CM | POA: Diagnosis not present

## 2017-12-09 DIAGNOSIS — G894 Chronic pain syndrome: Secondary | ICD-10-CM | POA: Diagnosis not present

## 2017-12-09 DIAGNOSIS — M542 Cervicalgia: Secondary | ICD-10-CM | POA: Diagnosis not present

## 2017-12-09 DIAGNOSIS — M545 Low back pain: Secondary | ICD-10-CM | POA: Diagnosis not present

## 2017-12-09 DIAGNOSIS — M48062 Spinal stenosis, lumbar region with neurogenic claudication: Secondary | ICD-10-CM | POA: Diagnosis not present

## 2017-12-17 ENCOUNTER — Other Ambulatory Visit: Payer: Self-pay | Admitting: Cardiology

## 2017-12-17 MED ORDER — FUROSEMIDE 20 MG PO TABS: 20.0000 mg | ORAL_TABLET | Freq: Every day | ORAL | 2 refills | Status: DC

## 2017-12-22 DIAGNOSIS — M19011 Primary osteoarthritis, right shoulder: Secondary | ICD-10-CM | POA: Diagnosis not present

## 2017-12-22 DIAGNOSIS — M25511 Pain in right shoulder: Secondary | ICD-10-CM | POA: Diagnosis not present

## 2017-12-22 DIAGNOSIS — M7581 Other shoulder lesions, right shoulder: Secondary | ICD-10-CM | POA: Diagnosis not present

## 2017-12-28 DIAGNOSIS — G549 Nerve root and plexus disorder, unspecified: Secondary | ICD-10-CM | POA: Diagnosis not present

## 2017-12-29 DIAGNOSIS — M545 Low back pain: Secondary | ICD-10-CM | POA: Diagnosis not present

## 2017-12-29 DIAGNOSIS — Z79891 Long term (current) use of opiate analgesic: Secondary | ICD-10-CM | POA: Diagnosis not present

## 2017-12-29 DIAGNOSIS — M5412 Radiculopathy, cervical region: Secondary | ICD-10-CM | POA: Diagnosis not present

## 2017-12-29 DIAGNOSIS — Z79899 Other long term (current) drug therapy: Secondary | ICD-10-CM | POA: Diagnosis not present

## 2017-12-29 DIAGNOSIS — M542 Cervicalgia: Secondary | ICD-10-CM | POA: Diagnosis not present

## 2017-12-29 DIAGNOSIS — G894 Chronic pain syndrome: Secondary | ICD-10-CM | POA: Diagnosis not present

## 2017-12-30 ENCOUNTER — Ambulatory Visit: Payer: Medicare Other

## 2017-12-30 NOTE — Progress Notes (Deleted)
Patient ID: Angela [name]                 DOB: [birth year]                      MRN: [id number]     HPI: Angela [name] is a 72 y.o. female patient of Dr. [name] with [place] below who presents today for pharmacy clinic. PMH is significant for HTN, HLD, CAD, angina, pancreatitis, obesity, IBS, and family history of early CAD. She has previously been seen in lipid clinic and HTN clinic. At her most recent visit with Ellen Henri, [place] in May, she admitted to a diet high in sodium. She was started on diltiazem at her most recent visit.   She presents today for discussion of cholesterol and blood pressure.    Current lipid medications: none  Intolerances: Crestor 10mg , 20mg , and 40mg  daily (myalgias)  LDL goal: <70  Current HTN meds:  Metoprolol tartrate 75mg  BID  Losartan 100mg  daily  Imdur 30mg  daily  HCTZ 12.5mg  daily  Diltiazem 120mg  daily Furosemide 20mg  daily   Previously tried:  Diltiazem (was held due to issues with gastric motility during a hospital admission in December 2017), amlodipine (LLE)  BP goal: <130/80  Diet: Minimal meat consumption, does snack frequently throughout the day  Exercise: Tries to walk with her husband. Would like to join [employer], but [employer] won't cover it  Family History: DM in her sister, MI in her father, heart disease in her mother, HTN in her mother and sister, stroke in her father.   Social History: Patient denies tobacco, alcohol, and illicit drug use.  Labs: 09/21/17:  TC 227, TG 172, HDL 44, LDL 149 (no therapy?) 02/20/16: TC 150, TG 112, HDL 50, LDL 78 (Crestor 10mg  daily)   Home BP readings:   Wt Readings from Last 3 Encounters:  10/28/17 172 lb 12.8 oz (78.4 kg)  08/19/17 168 lb (76.2 kg)  06/01/17 162 lb (73.5 kg)   BP Readings from Last 3 Encounters:  10/28/17 (!) 184/104  08/19/17 (!) 144/86  06/02/17 (!) 142/74   Pulse Readings from Last 3 Encounters:  10/28/17 71  08/19/17 70  06/02/17 65    Renal  function: CrCl cannot be calculated (Patient's most recent lab result is older than the maximum 21 days allowed.).  Past Medical History:  Diagnosis Date  . Anxiety   . Cancer of sigmoid ([place])   . Depression (emotion)   . DJD (degenerative joint disease)   . Fibromyalgia   . GERD (gastroesophageal reflux disease)   . Hemorrhoids   . Hyperlipidemia   . Hypertension   . IBS (irritable bowel syndrome)   . MRSA (methicillin resistant Staphylococcus aureus) septicemia ([place])    history of  . Over weight   . Pancreatitis   . RLS (restless legs syndrome)   . Tachycardia     Current Outpatient Medications on File Prior to Visit  Medication Sig Dispense Refill  . AMITIZA 8 MCG capsule Take 1 tablet by mouth 2 (two) times daily.    Marland Kitchen aspirin EC 81 MG tablet Take 81 mg by mouth daily.    . Cyanocobalamin (B-12) 2500 MCG TABS Take 2,500 mcg by mouth daily.     Marland Kitchen diltiazem (CARDIZEM) 120 MG tablet Take 1 tablet (120 mg total) by mouth daily. 30 tablet 3  . DULoxetine (CYMBALTA) 20 MG capsule Take 20 mg by mouth daily.    Marland Kitchen escitalopram (LEXAPRO) 5  MG tablet Take 5 mg by mouth at bedtime.     . fluticasone (FLONASE) 50 MCG/ACT nasal spray Place 2 sprays into both nostrils daily as needed (seasonal allergies).     . furosemide (LASIX) 20 MG tablet Take 1 tablet (20 mg total) by mouth daily. 90 tablet 2  . hydrochlorothiazide (HYDRODIURIL) 25 MG tablet Take 12.5 mg by mouth daily.    Marland Kitchen HYDROcodone-acetaminophen (NORCO) 10-325 MG tablet Take 1 tablet by mouth 4 (four) times daily as needed (pain).     . isosorbide mononitrate (IMDUR) 30 MG 24 hr tablet Take 1 tablet (30 mg total) by mouth daily. 90 tablet 1  . LORazepam (ATIVAN) 2 MG tablet Take 2 mg by mouth at bedtime.     Marland Kitchen losartan (COZAAR) 100 MG tablet Take 100 mg by mouth daily.    . metoprolol tartrate (LOPRESSOR) 50 MG tablet Take 1.5 tablets (75 mg total) by mouth 2 (two) times daily. 270 tablet 1  . Milnacipran HCl (SAVELLA) 100 MG  TABS tablet Take 100 mg by mouth 2 (two) times daily.    Marland Kitchen MOVANTIK 25 MG TABS tablet Take 1 tablet by mouth daily.    . nitroGLYCERIN (NITROSTAT) 0.4 MG SL tablet Place 1 tablet (0.4 mg total) under the tongue every 5 (five) minutes as needed for chest pain. 25 tablet 3  . omega-3 acid ethyl esters (LOVAZA) 1 g capsule Take 1 g by mouth daily.     . ondansetron (ZOFRAN-ODT) 4 MG disintegrating tablet Take 1 tablet (4 mg total) by mouth every 6 (six) hours as needed for nausea or vomiting. 30 tablet 0  . pantoprazole (PROTONIX) 40 MG tablet Take 40 mg by mouth daily.     Marland Kitchen rOPINIRole (REQUIP) 1 MG tablet Take 1-2 mg by mouth See admin instructions. Take 2 tablets (2 mg) by mouth daily at bedtime, may also take 1 tablet (1 mg) in the afternoon as needed for restless legs    . tiZANidine (ZANAFLEX) 4 MG tablet Take 4 mg by mouth every 8 (eight) hours as needed for muscle spasms.      No current facility-administered medications on file prior to visit.     Allergies  Allergen Reactions  . Norvasc [Amlodipine Besylate] Swelling  . Phenergan [Promethazine] Other (See Comments)    Restlessness   . Timentin [Ticarcillin-Pot Clavulanate] Anaphylaxis    Has patient had a PCN reaction causing immediate rash, facial/tongue/throat swelling, SOB or lightheadedness with hypotension: Yes Has patient had a PCN reaction causing severe rash involving mucus membranes or skin necrosis: Yes Has patient had a PCN reaction that required hospitalization Yes Has patient had a PCN reaction occurring within the last 10 years: No If all of the above answers are "NO", then may proceed with Cephalosporin use.   . Pregabalin Other (See Comments)    Mouth sores  . Reglan [Metoclopramide] Other (See Comments)    Cannot sit still    There were no vitals taken for this visit.   Assessment/Plan: Hypertension: BP today  Hyperlipidemia: LDL is not at goal. atorvastatin   Thank you, Lelan Pons. Patterson Hammersmith, [facility]  12/30/2017 7:05 AM

## 2018-01-22 ENCOUNTER — Ambulatory Visit (INDEPENDENT_AMBULATORY_CARE_PROVIDER_SITE_OTHER): Payer: Medicare Other

## 2018-01-22 VITALS — BP 160/98 | HR 78

## 2018-01-22 DIAGNOSIS — E782 Mixed hyperlipidemia: Secondary | ICD-10-CM

## 2018-01-22 DIAGNOSIS — I10 Essential (primary) hypertension: Secondary | ICD-10-CM

## 2018-01-22 MED ORDER — ROSUVASTATIN CALCIUM 10 MG PO TABS: 10.0000 mg | ORAL_TABLET | Freq: Every day | ORAL | 3 refills | Status: DC

## 2018-01-22 MED ORDER — AMLODIPINE BESYLATE 5 MG PO TABS: 5.0000 mg | ORAL_TABLET | Freq: Every day | ORAL | 11 refills | Status: DC

## 2018-01-22 NOTE — Progress Notes (Signed)
Patient ID: Angela [name]                 DOB: 09/10/1945                      MRN: [id number]     HPI: Angela [name] is a 72 y.o. female patient of Dr. [name], referred to HTN and lipid clinic by [name], PA-C.  PMH is significant for HTN, HLD, CAD, angina, pancreatitis, obesity, IBS, and family history of early CAD. She had exertional chest pain and underwent stress testing that was abnormal for possible ischemia. Cardiac cath revealed mild to moderate CAD with 30-40% proximal mid LAD stenosis and 70-75% ostial first diagonal stenosis, LVEF 55-65%. She is previously intolerant to multiple statins and is not currently on statin therapy.  She has a history of chronic gastric motility issues with most recent hospitalization in December 2019. March 2019 she presents to clinic with atypical chest pain. She was seen by [name] who increased metoprolol to 75mg  BID. In May 19 her PCP added HCTZ 12.5mg  and [name] added diltiazem 120mg . Angela [name] was previously on diltiazem but it has been held since a hospitalization in 2017 due to GI motility issues. She was previously seen in HTN/lipid clinic from 11/17 - 5/18. She presents today for f/u.   Angela [name] presents in good spirits. She has been having episodes of vomiting for the past two weeks and has been unable to take any of her medication. She is still experiencing atypical chest pain that has a rapid onset and resolves quickly with no medications. She reports these episodes 1-2 times per week. She reports some DOE, however this is her baseline and reports no new symptoms or worsening. She denies edema. She discontinued rosuvastatin 20mg  daily in the spring due to muscle aches. She reports the aches have since resolved. She previously tolerated rosuvastatin 10mg  daily for several years. She refers to her medications by their brand names.  She reports taking her blood pressure at home once weekly and the most recent readings have  been elevated. She denies NSAID use. She does eat a lot of salty foods. Unfortunately due to her GI motility issues her diet is limited in that she cannot eat uncooked vegetables, fruit with skin, or nuts. Patient was counseled on reducing the sodium in her diet. Patient does not currently exercise. Patient encouraged to start exercising.     Current lipid medications: None Intolerances: Crestor 20, 40mg  daily (myalgias); lovastatin (mayalgias); atorvastatin 20mg  (myalgias)  LDL goal: <70  Current HTN meds:  Diltiazem 120mg  daily HCTZ 12.5mg  daily  Furosemide 20mg  daily  Metoprolol tartrate 75mg  BID  Losartan 100mg  daily Imdur 30mg  daily  Previously tried: Amlodipine 10mg  daily - edema   BP goal: <130/80  Diet: Minimal meat consumption, does snack frequently throughout the day with high sodium diet. Due to gastroparesis cannot eat nuts, fruit with skins, uncooked vegetables    Exercise: No current exercise   Family History: DM in her sister, MI in her father, heart disease in her mother, HTN in her mother and sister, stroke in her father.   Social History: Patient denies tobacco, alcohol, and illicit drug use.  Labs: 09/21/17: TC 227, TG 172, HDL 44, LDL 149 (no statin therapy)  02/20/16: TC 150, TG 112, HDL 50, LDL 78 (Crestor 10mg  daily)  Home BP readings:  170-180/90-100 on normal day    Wt Readings from Last 3 Encounters:  10/28/17  172 lb 12.8 oz (78.4 kg)  08/19/17 168 lb (76.2 kg)  06/01/17 162 lb (73.5 kg)   BP Readings from Last 3 Encounters:  10/28/17 (!) 184/104  08/19/17 (!) 144/86  06/02/17 (!) 142/74   Pulse Readings from Last 3 Encounters:  10/28/17 71  08/19/17 70  06/02/17 65    Renal function: CrCl cannot be calculated (Patient's most recent lab result is older than the maximum 21 days allowed.).  Past Medical History:  Diagnosis Date  . Anxiety   . Cancer of sigmoid ([place])   . Depression (emotion)   . DJD (degenerative joint disease)   .  Fibromyalgia   . GERD (gastroesophageal reflux disease)   . Hemorrhoids   . Hyperlipidemia   . Hypertension   . IBS (irritable bowel syndrome)   . MRSA (methicillin resistant Staphylococcus aureus) septicemia ([place])    history of  . Over weight   . Pancreatitis   . RLS (restless legs syndrome)   . Tachycardia     Current Outpatient Medications on File Prior to Visit  Medication Sig Dispense Refill  . AMITIZA 8 MCG capsule Take 1 tablet by mouth 2 (two) times daily.    Marland Kitchen aspirin EC 81 MG tablet Take 81 mg by mouth daily.    . Cyanocobalamin (B-12) 2500 MCG TABS Take 2,500 mcg by mouth daily.     Marland Kitchen diltiazem (CARDIZEM) 120 MG tablet Take 1 tablet (120 mg total) by mouth daily. 30 tablet 3  . DULoxetine (CYMBALTA) 20 MG capsule Take 20 mg by mouth daily.    Marland Kitchen escitalopram (LEXAPRO) 5 MG tablet Take 5 mg by mouth at bedtime.     . fluticasone (FLONASE) 50 MCG/ACT nasal spray Place 2 sprays into both nostrils daily as needed (seasonal allergies).     . furosemide (LASIX) 20 MG tablet Take 1 tablet (20 mg total) by mouth daily. 90 tablet 2  . hydrochlorothiazide (HYDRODIURIL) 25 MG tablet Take 12.5 mg by mouth daily.    Marland Kitchen HYDROcodone-acetaminophen (NORCO) 10-325 MG tablet Take 1 tablet by mouth 4 (four) times daily as needed (pain).     . isosorbide mononitrate (IMDUR) 30 MG 24 hr tablet Take 1 tablet (30 mg total) by mouth daily. 90 tablet 1  . LORazepam (ATIVAN) 2 MG tablet Take 2 mg by mouth at bedtime.     Marland Kitchen losartan (COZAAR) 100 MG tablet Take 100 mg by mouth daily.    . metoprolol tartrate (LOPRESSOR) 50 MG tablet Take 1.5 tablets (75 mg total) by mouth 2 (two) times daily. 270 tablet 1  . Milnacipran HCl (SAVELLA) 100 MG TABS tablet Take 100 mg by mouth 2 (two) times daily.    Marland Kitchen MOVANTIK 25 MG TABS tablet Take 1 tablet by mouth daily.    . nitroGLYCERIN (NITROSTAT) 0.4 MG SL tablet Place 1 tablet (0.4 mg total) under the tongue every 5 (five) minutes as needed for chest pain. 25  tablet 3  . omega-3 acid ethyl esters (LOVAZA) 1 g capsule Take 1 g by mouth daily.     . ondansetron (ZOFRAN-ODT) 4 MG disintegrating tablet Take 1 tablet (4 mg total) by mouth every 6 (six) hours as needed for nausea or vomiting. 30 tablet 0  . pantoprazole (PROTONIX) 40 MG tablet Take 40 mg by mouth daily.     Marland Kitchen rOPINIRole (REQUIP) 1 MG tablet Take 1-2 mg by mouth See admin instructions. Take 2 tablets (2 mg) by mouth daily at bedtime, may also take 1  tablet (1 mg) in the afternoon as needed for restless legs    . tiZANidine (ZANAFLEX) 4 MG tablet Take 4 mg by mouth every 8 (eight) hours as needed for muscle spasms.      No current facility-administered medications on file prior to visit.     Allergies  Allergen Reactions  . Norvasc [Amlodipine Besylate] Swelling  . Phenergan [Promethazine] Other (See Comments)    Restlessness   . Timentin [Ticarcillin-Pot Clavulanate] Anaphylaxis    Has patient had a PCN reaction causing immediate rash, facial/tongue/throat swelling, SOB or lightheadedness with hypotension: Yes Has patient had a PCN reaction causing severe rash involving mucus membranes or skin necrosis: Yes Has patient had a PCN reaction that required hospitalization Yes Has patient had a PCN reaction occurring within the last 10 years: No If all of the above answers are "NO", then may proceed with Cephalosporin use.   . Pregabalin Other (See Comments)    Mouth sores  . Reglan [Metoclopramide] Other (See Comments)    Cannot sit still     Assessment/Plan:  1. Hypertension - Patient's blood pressure is not at goal of <130/22mmHg today. However, patient has not taken antihypertensive medications in two weeks. Patient does report edema with amlodipine 10mg , however is willing to try the lower dose of amlodipine 5mg  daily. Discontinue diltiazem 120mg  daily due to concerns with gastric motility. Patient denies edema or SOB, discontinue furosemide 20mg  daily to avoid use of multiple  diuretics. Continue HCTZ 12.5mg  daily, Metoprolol tartrate 75mg  BID, Losartan 100mg  daily and Imdur 30mg  daily. Pt advised to monitor BP at home and record readings. Discussed heart healthy diet and encouraged pt to become more active with target of 10-20 minutes of walking most days a week. F/u in clinic in 3-4 weeks.  2. HLD - LDL not at goal of 70mg /dL due to history of ASCVD. Patient has had myopathy with multiple statins, however has tolerated rosuvastatin 10mg  in the past. Start rosuvastatin 10mg  daily. This likely will not lower LDL to goal. Patient agreeable to PCSK9i therapy in the future. Will likely need new lipid panel once patient is on rosuvastatin 10mg  daily to start process for PCSK9i coverage. Will assess tolerability at next pharmacy visit and can schedule f/u labs at that time.   Angela [name], Sherian Rein D PGY1 Pharmacy Resident  Phone [phone] 01/22/2018      9:10 AM

## 2018-01-22 NOTE — Patient Instructions (Addendum)
It was great seeing you today.  Your blood pressure was not at goal of less than 130/57mmHg. It was 160/24mmHg today.  Start taking Norvasc (amlodipine) 5 mg daily.  Stop taking Cardizem (diltazem) 120mg  daily.  Stop taking Lasix (furosemide) 20mg  daily.   Continue taking HCTZ 12.5mg  daily, Metoprolol tartrate 75mg  BID, Losartan 100mg  daily and Imdur 30mg  daily.  Your LDL was not at goal of less than 70mg /dL. It was 149mg /dL in April.   Start taking Crestor (rosuvastatin) 10mg  daily. You can take 1/2 of rosuvastatin 20mg  until you run out.   We will work with your insurance to get you approved for a PCSK9-I (Praluent or Repatha).   Please call [phone] if you have any side effects or concerns.   Follow up in clinic in 3-4 weeks.

## 2018-01-27 DIAGNOSIS — G894 Chronic pain syndrome: Secondary | ICD-10-CM | POA: Diagnosis not present

## 2018-01-27 DIAGNOSIS — M542 Cervicalgia: Secondary | ICD-10-CM | POA: Diagnosis not present

## 2018-01-27 DIAGNOSIS — M5030 Other cervical disc degeneration, unspecified cervical region: Secondary | ICD-10-CM | POA: Diagnosis not present

## 2018-01-27 DIAGNOSIS — M961 Postlaminectomy syndrome, not elsewhere classified: Secondary | ICD-10-CM | POA: Diagnosis not present

## 2018-01-29 DIAGNOSIS — N63 Unspecified lump in unspecified breast: Secondary | ICD-10-CM | POA: Diagnosis not present

## 2018-01-29 DIAGNOSIS — F329 Major depressive disorder, single episode, unspecified: Secondary | ICD-10-CM | POA: Diagnosis not present

## 2018-01-29 DIAGNOSIS — R110 Nausea: Secondary | ICD-10-CM | POA: Diagnosis not present

## 2018-01-29 DIAGNOSIS — L237 Allergic contact dermatitis due to plants, except food: Secondary | ICD-10-CM | POA: Diagnosis not present

## 2018-02-03 DIAGNOSIS — M4316 Spondylolisthesis, lumbar region: Secondary | ICD-10-CM | POA: Diagnosis not present

## 2018-02-03 DIAGNOSIS — G959 Disease of spinal cord, unspecified: Secondary | ICD-10-CM | POA: Diagnosis not present

## 2018-02-03 DIAGNOSIS — M48061 Spinal stenosis, lumbar region without neurogenic claudication: Secondary | ICD-10-CM | POA: Diagnosis not present

## 2018-02-11 DIAGNOSIS — H40053 Ocular hypertension, bilateral: Secondary | ICD-10-CM | POA: Diagnosis not present

## 2018-02-11 DIAGNOSIS — N6320 Unspecified lump in the left breast, unspecified quadrant: Secondary | ICD-10-CM | POA: Diagnosis not present

## 2018-02-11 DIAGNOSIS — M5136 Other intervertebral disc degeneration, lumbar region: Secondary | ICD-10-CM | POA: Diagnosis not present

## 2018-02-11 DIAGNOSIS — M4316 Spondylolisthesis, lumbar region: Secondary | ICD-10-CM | POA: Diagnosis not present

## 2018-02-11 DIAGNOSIS — M5137 Other intervertebral disc degeneration, lumbosacral region: Secondary | ICD-10-CM | POA: Diagnosis not present

## 2018-02-11 DIAGNOSIS — M545 Low back pain: Secondary | ICD-10-CM | POA: Diagnosis not present

## 2018-02-11 DIAGNOSIS — H43813 Vitreous degeneration, bilateral: Secondary | ICD-10-CM | POA: Diagnosis not present

## 2018-02-11 DIAGNOSIS — N63 Unspecified lump in unspecified breast: Secondary | ICD-10-CM | POA: Diagnosis not present

## 2018-02-11 DIAGNOSIS — H259 Unspecified age-related cataract: Secondary | ICD-10-CM | POA: Diagnosis not present

## 2018-02-11 DIAGNOSIS — R928 Other abnormal and inconclusive findings on diagnostic imaging of breast: Secondary | ICD-10-CM | POA: Diagnosis not present

## 2018-02-18 ENCOUNTER — Ambulatory Visit: Payer: Medicare Other

## 2018-02-24 DIAGNOSIS — Z79899 Other long term (current) drug therapy: Secondary | ICD-10-CM | POA: Diagnosis not present

## 2018-02-24 DIAGNOSIS — M5412 Radiculopathy, cervical region: Secondary | ICD-10-CM | POA: Diagnosis not present

## 2018-02-24 DIAGNOSIS — Z79891 Long term (current) use of opiate analgesic: Secondary | ICD-10-CM | POA: Diagnosis not present

## 2018-02-24 DIAGNOSIS — G894 Chronic pain syndrome: Secondary | ICD-10-CM | POA: Diagnosis not present

## 2018-02-24 DIAGNOSIS — M961 Postlaminectomy syndrome, not elsewhere classified: Secondary | ICD-10-CM | POA: Diagnosis not present

## 2018-02-24 DIAGNOSIS — M5030 Other cervical disc degeneration, unspecified cervical region: Secondary | ICD-10-CM | POA: Diagnosis not present

## 2018-03-01 DIAGNOSIS — F329 Major depressive disorder, single episode, unspecified: Secondary | ICD-10-CM | POA: Diagnosis not present

## 2018-03-01 DIAGNOSIS — Z23 Encounter for immunization: Secondary | ICD-10-CM | POA: Diagnosis not present

## 2018-03-01 DIAGNOSIS — M797 Fibromyalgia: Secondary | ICD-10-CM | POA: Diagnosis not present

## 2018-03-01 DIAGNOSIS — I10 Essential (primary) hypertension: Secondary | ICD-10-CM | POA: Diagnosis not present

## 2018-03-01 DIAGNOSIS — Z0000 Encounter for general adult medical examination without abnormal findings: Secondary | ICD-10-CM | POA: Diagnosis not present

## 2018-03-01 DIAGNOSIS — E785 Hyperlipidemia, unspecified: Secondary | ICD-10-CM | POA: Diagnosis not present

## 2018-03-01 DIAGNOSIS — R7309 Other abnormal glucose: Secondary | ICD-10-CM | POA: Diagnosis not present

## 2018-03-01 DIAGNOSIS — J309 Allergic rhinitis, unspecified: Secondary | ICD-10-CM | POA: Diagnosis not present

## 2018-03-09 ENCOUNTER — Other Ambulatory Visit: Payer: Self-pay | Admitting: Cardiology

## 2018-03-09 MED ORDER — AMLODIPINE BESYLATE 5 MG PO TABS: 5.0000 mg | ORAL_TABLET | Freq: Every day | ORAL | 2 refills | Status: DC

## 2018-03-11 ENCOUNTER — Ambulatory Visit: Payer: Medicare Other

## 2018-03-11 NOTE — Progress Notes (Deleted)
Patient ID: [name]                 DOB: November 29, 1945                      MRN: [id number]     HPI: Angela [name] is a 72 y.o. female patient of Dr. Meda [name], referred to HTN and lipid clinic by Angela Jester, PA-C.  PMH is significant for HTN, HLD, CAD, angina, pancreatitis, obesity, IBS, and family history of early CAD. She had exertional chest pain and underwent stress testing that was abnormal for possible ischemia. Cardiac cath revealed mild to moderate CAD with 30-40% proximal mid LAD stenosis and 70-75% ostial first diagonal stenosis, LVEF 55-65%. She is previously intolerant to multiple statins and is not currently on statin therapy. She has a history of chronic gastric motility issues with most recent hospitalization in December 2019. March 2019 she presents to clinic with atypical chest pain. She was seen by Angela [name] who increased metoprolol to 75mg  BID. In May 19 her PCP added HCTZ 12.5mg  and [name] added diltiazem 120mg . Ms. Angela [name] was previously on diltiazem but it has been held since a hospitalization in 2017 due to GI motility issues. She was previously seen in HTN/lipid clinic from 11/17 - 5/18. She presents today for f/u. At her last visit she was restarted on rosuvastatin 10mg  daily and her antihypertensives were adjusted due to edema and poorly controlled pressures.   Ms. Angela [name] presents in good spirits.   She reports taking her blood pressure at home once weekly and the most recent readings have been elevated. She denies NSAID use. She does eat a lot of salty foods. Unfortunately due to her GI motility issues her diet is limited in that she cannot eat uncooked vegetables, fruit with skin, or nuts. Patient was counseled on reducing the sodium in her diet. Patient does not currently exercise. Patient encouraged to start exercising.     Current lipid medications: rosuvastatin 10mg  daily  Intolerances: Crestor 20, 40mg  daily (myalgias); lovastatin (mayalgias);  atorvastatin 20mg  (myalgias)  LDL goal: <70  Current HTN meds:  HCTZ 12.5mg  daily  Metoprolol tartrate 75mg  BID  Losartan 100mg  daily Imdur 30mg  daily  Amlodipine 5mg  daily  Previously tried: Amlodipine 10mg  daily - edema, dilt - GI motility issues BP goal: <130/80  Diet: Minimal meat consumption, does snack frequently throughout the day with high sodium diet. Due to gastroparesis cannot eat nuts, fruit with skins, uncooked vegetables    Exercise: No current exercise   Family History: DM in her sister, MI in her father, heart disease in her mother, HTN in her mother and sister, stroke in her father.   Social History: Patient denies tobacco, alcohol, and illicit drug use.  Labs: 09/21/17: TC 227, TG 172, HDL 44, LDL 149 (no statin therapy)  02/20/16: TC 150, TG 112, HDL 50, LDL 78 (Crestor 10mg  daily)  Home BP readings:     Wt Readings from Last 3 Encounters:  10/28/17 172 lb 12.8 oz (78.4 kg)  08/19/17 168 lb (76.2 kg)  06/01/17 162 lb (73.5 kg)   BP Readings from Last 3 Encounters:  01/22/18 (!) 160/98  10/28/17 (!) 184/104  08/19/17 (!) 144/86   Pulse Readings from Last 3 Encounters:  01/22/18 78  10/28/17 71  08/19/17 70    Renal function: CrCl cannot be calculated (Patient's most recent lab result is older than the maximum 21 days allowed.).  Past Medical History:  Diagnosis Date  . Anxiety   . Cancer of sigmoid ([place])   . Depression (emotion)   . DJD (degenerative joint disease)   . Fibromyalgia   . GERD (gastroesophageal reflux disease)   . Hemorrhoids   . Hyperlipidemia   . Hypertension   . IBS (irritable bowel syndrome)   . MRSA (methicillin resistant Staphylococcus aureus) septicemia ([place])    history of  . Over weight   . Pancreatitis   . RLS (restless legs syndrome)   . Tachycardia     Current Outpatient Medications on File Prior to Visit  Medication Sig Dispense Refill  . AMITIZA 8 MCG capsule Take 1 tablet by mouth 2 (two) times  daily.    Marland Kitchen amLODipine (NORVASC) 5 MG tablet Take 1 tablet (5 mg total) by mouth daily. 90 tablet 2  . aspirin EC 81 MG tablet Take 81 mg by mouth daily.    . Cyanocobalamin (B-12) 2500 MCG TABS Take 2,500 mcg by mouth daily.     . DULoxetine (CYMBALTA) 20 MG capsule Take 20 mg by mouth daily.    Marland Kitchen escitalopram (LEXAPRO) 5 MG tablet Take 5 mg by mouth at bedtime.     . fluticasone (FLONASE) 50 MCG/ACT nasal spray Place 2 sprays into both nostrils daily as needed (seasonal allergies).     . hydrochlorothiazide (HYDRODIURIL) 25 MG tablet Take 12.5 mg by mouth daily.    Marland Kitchen HYDROcodone-acetaminophen (NORCO) 10-325 MG tablet Take 1 tablet by mouth 4 (four) times daily as needed (pain).     . isosorbide mononitrate (IMDUR) 30 MG 24 hr tablet Take 1 tablet (30 mg total) by mouth daily. 90 tablet 1  . LORazepam (ATIVAN) 2 MG tablet Take 2 mg by mouth at bedtime.     Marland Kitchen losartan (COZAAR) 100 MG tablet Take 100 mg by mouth daily.    . metoprolol tartrate (LOPRESSOR) 50 MG tablet Take 1.5 tablets (75 mg total) by mouth 2 (two) times daily. 270 tablet 1  . Milnacipran HCl (SAVELLA) 100 MG TABS tablet Take 100 mg by mouth 2 (two) times daily.    Marland Kitchen MOVANTIK 25 MG TABS tablet Take 1 tablet by mouth daily.    . nitroGLYCERIN (NITROSTAT) 0.4 MG SL tablet Place 1 tablet (0.4 mg total) under the tongue every 5 (five) minutes as needed for chest pain. 25 tablet 3  . omega-3 acid ethyl esters (LOVAZA) 1 g capsule Take 1 g by mouth daily.     . ondansetron (ZOFRAN-ODT) 4 MG disintegrating tablet Take 1 tablet (4 mg total) by mouth every 6 (six) hours as needed for nausea or vomiting. 30 tablet 0  . pantoprazole (PROTONIX) 40 MG tablet Take 40 mg by mouth daily.     Marland Kitchen rOPINIRole (REQUIP) 1 MG tablet Take 1-2 mg by mouth See admin instructions. Take 2 tablets (2 mg) by mouth daily at bedtime, may also take 1 tablet (1 mg) in the afternoon as needed for restless legs    . rosuvastatin (CRESTOR) 10 MG tablet Take 1 tablet  (10 mg total) by mouth daily. 90 tablet 3  . tiZANidine (ZANAFLEX) 4 MG tablet Take 4 mg by mouth every 8 (eight) hours as needed for muscle spasms.      No current facility-administered medications on file prior to visit.     Allergies  Allergen Reactions  . Norvasc [Amlodipine Besylate] Swelling  . Phenergan [Promethazine] Other (See Comments)    Restlessness   . Timentin [Ticarcillin-Pot Clavulanate] Anaphylaxis  Has patient had a PCN reaction causing immediate rash, facial/tongue/throat swelling, SOB or lightheadedness with hypotension: Yes Has patient had a PCN reaction causing severe rash involving mucus membranes or skin necrosis: Yes Has patient had a PCN reaction that required hospitalization Yes Has patient had a PCN reaction occurring within the last 10 years: No If all of the above answers are "NO", then may proceed with Cephalosporin use.   . Pregabalin Other (See Comments)    Mouth sores  . Reglan [Metoclopramide] Other (See Comments)    Cannot sit still     Assessment/Plan:  1. Hypertension - Patient's blood pressure is not at goal of <130/63mmHg today.   2. HLD - reorder lipid panel   Thank you, Lelan Pons. [facility], [place]  [address] [place], [place] [address]  Phone: [phone]; Fax: [phone] 03/11/2018 7:16 AM

## 2018-03-24 DIAGNOSIS — G894 Chronic pain syndrome: Secondary | ICD-10-CM | POA: Diagnosis not present

## 2018-03-24 DIAGNOSIS — M5412 Radiculopathy, cervical region: Secondary | ICD-10-CM | POA: Diagnosis not present

## 2018-03-24 DIAGNOSIS — M545 Low back pain: Secondary | ICD-10-CM | POA: Diagnosis not present

## 2018-03-24 DIAGNOSIS — M797 Fibromyalgia: Secondary | ICD-10-CM | POA: Diagnosis not present

## 2018-03-30 ENCOUNTER — Encounter: Payer: Self-pay | Admitting: Pharmacist

## 2018-03-30 ENCOUNTER — Ambulatory Visit (INDEPENDENT_AMBULATORY_CARE_PROVIDER_SITE_OTHER): Payer: Medicare Other | Admitting: Pharmacist

## 2018-03-30 VITALS — BP 122/74 | HR 62

## 2018-03-30 DIAGNOSIS — N289 Disorder of kidney and ureter, unspecified: Secondary | ICD-10-CM | POA: Diagnosis not present

## 2018-03-30 DIAGNOSIS — I10 Essential (primary) hypertension: Secondary | ICD-10-CM

## 2018-03-30 DIAGNOSIS — E782 Mixed hyperlipidemia: Secondary | ICD-10-CM | POA: Diagnosis not present

## 2018-03-30 MED ORDER — AMLODIPINE BESYLATE 2.5 MG PO TABS: 5.0000 mg | ORAL_TABLET | Freq: Every day | ORAL | Status: DC

## 2018-03-30 NOTE — Progress Notes (Signed)
Patient ID: Angela [name]                 DOB: Dec 14, 1945                      MRN: [id number]     HPI: Angela [name] is a 72 y.o. female patient of Dr. [name], referred to HTN and lipid clinic by [name], PA-C.  PMH is significant for HTN, HLD, CAD, angina, pancreatitis, obesity, IBS, and family history of early CAD. She had exertional chest pain and underwent stress testing that was abnormal for possible ischemia. Cardiac cath revealed mild to moderate CAD with 30-40% proximal mid LAD stenosis and 70-75% ostial first diagonal stenosis, LVEF 55-65%. She is previously intolerant to multiple statins and is not currently on statin therapy. She has a history of chronic gastric motility issues with most recent hospitalization in December 2019. March 2019 she presents to clinic with atypical chest pain. She was seen by [name] who increased metoprolol to 75mg  BID. In May 19 her PCP added HCTZ 12.5mg  and [name] added diltiazem 120mg . Angela [name] was previously on diltiazem but it has been held since a hospitalization in 2017 due to GI motility issues. She was previously seen in HTN/lipid clinic from 11/17 - 5/18. She presents today for f/u. At her last visit she was restarted on rosuvastatin 10mg  daily and her antihypertensives were adjusted due to edema and poorly controlled pressures.   Angela [name] presents in good spirits. She states her pain and blood pressure are much better. She only has dizziness when standing quickly. She states this has not worsened recently, but she previously has blacked out in this setting. She reports that she has noticed increasing edema in her lower legs. She has had this issue in the past with higher doses of amlodipine.   She has been tolerating rosuvastatin well. She is having blood work drawn at her primary care office today and will order a lipid/hepatic panel to add on.    Current lipid medications: rosuvastatin 10mg  daily  Intolerances: Crestor  20, 40mg  daily (myalgias); lovastatin (mayalgias); atorvastatin 20mg  (myalgias), lovaza (taste) LDL goal: <70  Current HTN meds:  HCTZ 12.5mg  daily  Metoprolol tartrate 75mg  BID  Losartan 100mg  daily in the morning Imdur 30mg  daily in the morning Amlodipine 5mg  daily in the morning  Previously tried: Amlodipine 10mg  daily - edema, dilt - GI motility issues BP goal: <130/80  Diet: Minimal meat consumption, does snack frequently throughout the day with high sodium diet. Due to gastroparesis cannot eat nuts, fruit with skins, uncooked vegetables    Exercise: No current exercise. She has been encouraged to start exercising  Family History: DM in her sister, MI in her father, heart disease in her mother, HTN in her mother and sister, stroke in her father.   Social History: Patient denies tobacco, alcohol, and illicit drug use.  Labs: 09/21/17: TC 227, TG 172, HDL 44, LDL 149 (no statin therapy)  02/20/16: TC 150, TG 112, HDL 50, LDL 78 (Crestor 10mg  daily)  Home BP readings:  120/70s 118/82    Wt Readings from Last 3 Encounters:  10/28/17 172 lb 12.8 oz (78.4 kg)  08/19/17 168 lb (76.2 kg)  06/01/17 162 lb (73.5 kg)   BP Readings from Last 3 Encounters:  01/22/18 (!) 160/98  10/28/17 (!) 184/104  08/19/17 (!) 144/86   Pulse Readings from Last 3 Encounters:  01/22/18 78  10/28/17 71  08/19/17  70    Renal function: CrCl cannot be calculated (Patient's most recent lab result is older than the maximum 21 days allowed.).  Past Medical History:  Diagnosis Date  . Anxiety   . Cancer of sigmoid ([place])   . Depression (emotion)   . DJD (degenerative joint disease)   . Fibromyalgia   . GERD (gastroesophageal reflux disease)   . Hemorrhoids   . Hyperlipidemia   . Hypertension   . IBS (irritable bowel syndrome)   . MRSA (methicillin resistant Staphylococcus aureus) septicemia ([place])    history of  . Over weight   . Pancreatitis   . RLS (restless legs syndrome)   .  Tachycardia     Current Outpatient Medications on File Prior to Visit  Medication Sig Dispense Refill  . AMITIZA 8 MCG capsule Take 1 tablet by mouth 2 (two) times daily.    Marland Kitchen amLODipine (NORVASC) 5 MG tablet Take 1 tablet (5 mg total) by mouth daily. 90 tablet 2  . aspirin EC 81 MG tablet Take 81 mg by mouth daily.    . Cyanocobalamin (B-12) 2500 MCG TABS Take 2,500 mcg by mouth daily.     . DULoxetine (CYMBALTA) 20 MG capsule Take 20 mg by mouth daily.    Marland Kitchen escitalopram (LEXAPRO) 5 MG tablet Take 5 mg by mouth at bedtime.     . fluticasone (FLONASE) 50 MCG/ACT nasal spray Place 2 sprays into both nostrils daily as needed (seasonal allergies).     . hydrochlorothiazide (HYDRODIURIL) 25 MG tablet Take 12.5 mg by mouth daily.    Marland Kitchen HYDROcodone-acetaminophen (NORCO) 10-325 MG tablet Take 1 tablet by mouth 4 (four) times daily as needed (pain).     . isosorbide mononitrate (IMDUR) 30 MG 24 hr tablet Take 1 tablet (30 mg total) by mouth daily. 90 tablet 1  . LORazepam (ATIVAN) 2 MG tablet Take 2 mg by mouth at bedtime.     Marland Kitchen losartan (COZAAR) 100 MG tablet Take 100 mg by mouth daily.    . metoprolol tartrate (LOPRESSOR) 50 MG tablet Take 1.5 tablets (75 mg total) by mouth 2 (two) times daily. 270 tablet 1  . Milnacipran HCl (SAVELLA) 100 MG TABS tablet Take 100 mg by mouth 2 (two) times daily.    Marland Kitchen MOVANTIK 25 MG TABS tablet Take 1 tablet by mouth daily.    . nitroGLYCERIN (NITROSTAT) 0.4 MG SL tablet Place 1 tablet (0.4 mg total) under the tongue every 5 (five) minutes as needed for chest pain. 25 tablet 3  . omega-3 acid ethyl esters (LOVAZA) 1 g capsule Take 1 g by mouth daily.     . ondansetron (ZOFRAN-ODT) 4 MG disintegrating tablet Take 1 tablet (4 mg total) by mouth every 6 (six) hours as needed for nausea or vomiting. 30 tablet 0  . pantoprazole (PROTONIX) 40 MG tablet Take 40 mg by mouth daily.     Marland Kitchen rOPINIRole (REQUIP) 1 MG tablet Take 1-2 mg by mouth See admin instructions. Take 2  tablets (2 mg) by mouth daily at bedtime, may also take 1 tablet (1 mg) in the afternoon as needed for restless legs    . rosuvastatin (CRESTOR) 10 MG tablet Take 1 tablet (10 mg total) by mouth daily. 90 tablet 3  . tiZANidine (ZANAFLEX) 4 MG tablet Take 4 mg by mouth every 8 (eight) hours as needed for muscle spasms.      No current facility-administered medications on file prior to visit.     Allergies  Allergen Reactions  . Norvasc [Amlodipine Besylate] Swelling  . Phenergan [Promethazine] Other (See Comments)    Restlessness   . Timentin [Ticarcillin-Pot Clavulanate] Anaphylaxis    Has patient had a PCN reaction causing immediate rash, facial/tongue/throat swelling, SOB or lightheadedness with hypotension: Yes Has patient had a PCN reaction causing severe rash involving mucus membranes or skin necrosis: Yes Has patient had a PCN reaction that required hospitalization Yes Has patient had a PCN reaction occurring within the last 10 years: No If all of the above answers are "NO", then may proceed with Cephalosporin use.   . Pregabalin Other (See Comments)    Mouth sores  . Reglan [Metoclopramide] Other (See Comments)    Cannot sit still     Assessment/Plan:  1. Hypertension -  BP is well controlled today; however, she is experiencing some LLE. Will cut back on dose of amlodipine to 2.5mg  daily to help with LLE. Will continue all other medications as prescribed due to concern for increased dizziness. Advised to monitor pressures and swelling on lower dose. Follow up in HTN clinic in 4-6 weeks to adjust medications if needed.   2. HLD - provided order for lipids/hepatic to be drawn with other labs today. Will await results for need to adjust therapy.    Thank you, Lelan Pons. Patterson Hammersmith, [facility]  [address] [place], [place] [address]  Phone: [phone]; Fax: [phone] 03/30/2018 7:16 AM

## 2018-03-30 NOTE — Patient Instructions (Signed)
Return for a follow up appointment in 4-6 weeks  Go to the lab today for cholesterol panel   Your blood pressure goal is less than 130/80  Check your blood pressure at home daily (if able) and keep record of the readings.  Take your BP meds as follows: DECREASE amlodipine (NORVASC) to 2.5mg  (1/2 tablet) once daily  CONTINUE all other medications as prescribed.   Bring all of your meds, your BP cuff and your record of home blood pressures to your next appointment.  Exercise as you're able, try to walk approximately 30 minutes per day.  Keep salt intake to a minimum, especially watch canned and prepared boxed foods.  Eat more fresh fruits and vegetables and fewer canned items.  Avoid eating in fast food restaurants.    HOW TO TAKE YOUR BLOOD PRESSURE: . Rest 5 minutes before taking your blood pressure. .  Don't smoke or drink caffeinated beverages for at least 30 minutes before. . Take your blood pressure before (not after) you eat. . Sit comfortably with your back supported and both feet on the floor (don't cross your legs). . Elevate your arm to heart level on a table or a desk. . Use the proper sized cuff. It should fit smoothly and snugly around your bare upper arm. There should be enough room to slip a fingertip under the cuff. The bottom edge of the cuff should be 1 inch above the crease of the elbow. . Ideally, take 3 measurements at one sitting and record the average.

## 2018-04-01 ENCOUNTER — Other Ambulatory Visit: Payer: Self-pay | Admitting: Cardiology

## 2018-04-21 DIAGNOSIS — G894 Chronic pain syndrome: Secondary | ICD-10-CM | POA: Diagnosis not present

## 2018-04-21 DIAGNOSIS — M5412 Radiculopathy, cervical region: Secondary | ICD-10-CM | POA: Diagnosis not present

## 2018-04-21 DIAGNOSIS — Z79891 Long term (current) use of opiate analgesic: Secondary | ICD-10-CM | POA: Diagnosis not present

## 2018-04-21 DIAGNOSIS — Z79899 Other long term (current) drug therapy: Secondary | ICD-10-CM | POA: Diagnosis not present

## 2018-04-21 DIAGNOSIS — M542 Cervicalgia: Secondary | ICD-10-CM | POA: Diagnosis not present

## 2018-05-03 DIAGNOSIS — M899 Disorder of bone, unspecified: Secondary | ICD-10-CM | POA: Diagnosis not present

## 2018-05-03 DIAGNOSIS — M48061 Spinal stenosis, lumbar region without neurogenic claudication: Secondary | ICD-10-CM | POA: Diagnosis not present

## 2018-05-03 DIAGNOSIS — M5126 Other intervertebral disc displacement, lumbar region: Secondary | ICD-10-CM | POA: Diagnosis not present

## 2018-05-03 DIAGNOSIS — D321 Benign neoplasm of spinal meninges: Secondary | ICD-10-CM | POA: Diagnosis not present

## 2018-05-04 ENCOUNTER — Ambulatory Visit: Payer: Medicare Other

## 2018-05-17 DIAGNOSIS — M545 Low back pain: Secondary | ICD-10-CM | POA: Diagnosis not present

## 2018-05-17 DIAGNOSIS — M47816 Spondylosis without myelopathy or radiculopathy, lumbar region: Secondary | ICD-10-CM | POA: Diagnosis not present

## 2018-05-18 DIAGNOSIS — G894 Chronic pain syndrome: Secondary | ICD-10-CM | POA: Diagnosis not present

## 2018-05-18 DIAGNOSIS — M5030 Other cervical disc degeneration, unspecified cervical region: Secondary | ICD-10-CM | POA: Diagnosis not present

## 2018-05-18 DIAGNOSIS — M5412 Radiculopathy, cervical region: Secondary | ICD-10-CM | POA: Diagnosis not present

## 2018-05-18 DIAGNOSIS — M25519 Pain in unspecified shoulder: Secondary | ICD-10-CM | POA: Diagnosis not present

## 2018-05-19 DIAGNOSIS — G9589 Other specified diseases of spinal cord: Secondary | ICD-10-CM | POA: Diagnosis not present

## 2018-06-09 ENCOUNTER — Ambulatory Visit: Payer: Medicare Other

## 2018-06-09 DIAGNOSIS — R202 Paresthesia of skin: Secondary | ICD-10-CM | POA: Diagnosis not present

## 2018-06-09 DIAGNOSIS — M4802 Spinal stenosis, cervical region: Secondary | ICD-10-CM | POA: Diagnosis not present

## 2018-06-09 DIAGNOSIS — M4722 Other spondylosis with radiculopathy, cervical region: Secondary | ICD-10-CM | POA: Diagnosis not present

## 2018-06-09 DIAGNOSIS — M4692 Unspecified inflammatory spondylopathy, cervical region: Secondary | ICD-10-CM | POA: Diagnosis not present

## 2018-06-09 DIAGNOSIS — Z981 Arthrodesis status: Secondary | ICD-10-CM | POA: Diagnosis not present

## 2018-06-09 DIAGNOSIS — M4712 Other spondylosis with myelopathy, cervical region: Secondary | ICD-10-CM | POA: Diagnosis not present

## 2018-06-09 DIAGNOSIS — M5023 Other cervical disc displacement, cervicothoracic region: Secondary | ICD-10-CM | POA: Diagnosis not present

## 2018-06-09 NOTE — Progress Notes (Deleted)
Patient ID: Angela [name]                 DOB: 1945/12/05                      MRN: [id number]     HPI: Angela [name] is a 73 y.o. female patient of Dr. [name], referred to HTN and lipid clinic by [name], PA-C.  PMH is significant for HTN, HLD, CAD, angina, pancreatitis, obesity, IBS, and family history of early CAD. She had exertional chest pain and underwent stress testing that was abnormal for possible ischemia. Cardiac cath revealed mild to moderate CAD with 30-40% proximal mid LAD stenosis and 70-75% ostial first diagonal stenosis, LVEF 55-65%. She is previously intolerant to multiple statins and is not currently on statin therapy. She has a history of chronic gastric motility issues with most recent hospitalization in December 2019. March 2019 she presents to clinic with atypical chest pain. She was seen by [name] who increased metoprolol to 75mg  BID. In May 19 her PCP added HCTZ 12.5mg  and [name] added diltiazem 120mg . [name] was previously on diltiazem but it has been held since a hospitalization in 2017 due to GI motility issues. She was previously seen in HTN/lipid clinic from 11/17 - 5/18. She presents today for f/u. At her last visit her blood pressure was well controlled, but was experiencing slight dizziness upon standing and LLE, therefore her amlodipine was decreased to 2.5mg  daily.   Zetia? Or PCSK9 inhib Recheck?   Current lipid medications: rosuvastatin 10mg  daily  Intolerances: Crestor 20, 40mg  daily (myalgias); lovastatin (mayalgias); atorvastatin 20mg  (myalgias), lovaza (taste) LDL goal: <70  Current HTN meds:  HCTZ 12.5mg  daily  Metoprolol tartrate 75mg  BID  Losartan 100mg  daily in the morning Imdur 30mg  daily in the morning Amlodipine 2.5mg  daily in the morning  Previously tried: Amlodipine 10mg  daily and 5mg  daily - edema, dilt - GI motility issues BP goal: <130/80  Diet: Minimal meat consumption, does snack frequently throughout  the day with high sodium diet. Due to gastroparesis cannot eat nuts, fruit with skins, uncooked vegetables    Exercise: No current exercise. She has been encouraged to start exercising  Family History: DM in her sister, MI in her father, heart disease in her mother, HTN in her mother and sister, stroke in her father.   Social History: Patient denies tobacco, alcohol, and illicit drug use.  Labs: 03/30/18: TC 170, TG 173, HDL 40, LDL 95 (Crestor 10mg  daily) 09/21/17: TC 227, TG 172, HDL 44, LDL 149 (no statin therapy)  02/20/16: TC 150, TG 112, HDL 50, LDL 78 (Crestor 10mg  daily)  Home BP readings:  120/70s 118/82    Wt Readings from Last 3 Encounters:  10/28/17 172 lb 12.8 oz (78.4 kg)  08/19/17 168 lb (76.2 kg)  06/01/17 162 lb (73.5 kg)   BP Readings from Last 3 Encounters:  03/30/18 122/74  01/22/18 (!) 160/98  10/28/17 (!) 184/104   Pulse Readings from Last 3 Encounters:  03/30/18 62  01/22/18 78  10/28/17 71    Renal function: CrCl cannot be calculated (Patient's most recent lab result is older than the maximum 21 days allowed.).  Past Medical History:  Diagnosis Date  . Anxiety   . Cancer of sigmoid ([place])   . Depression (emotion)   . DJD (degenerative joint disease)   . Fibromyalgia   . GERD (gastroesophageal reflux disease)   . Hemorrhoids   . Hyperlipidemia   .  Hypertension   . IBS (irritable bowel syndrome)   . MRSA (methicillin resistant Staphylococcus aureus) septicemia ([place])    history of  . Over weight   . Pancreatitis   . RLS (restless legs syndrome)   . Tachycardia     Current Outpatient Medications on File Prior to Visit  Medication Sig Dispense Refill  . amLODipine (NORVASC) 2.5 MG tablet Take 2 tablets (5 mg total) by mouth daily.    Marland Kitchen aspirin EC 81 MG tablet Take 81 mg by mouth daily.    . Cyanocobalamin (B-12) 2500 MCG TABS Take 2,500 mcg by mouth daily.     . DULoxetine (CYMBALTA) 20 MG capsule Take 20 mg by mouth 2 (two) times  daily.     Marland Kitchen escitalopram (LEXAPRO) 5 MG tablet Take 5 mg by mouth at bedtime.     . fluticasone (FLONASE) 50 MCG/ACT nasal spray Place 2 sprays into both nostrils daily as needed (seasonal allergies).     . hydrochlorothiazide (HYDRODIURIL) 25 MG tablet Take 12.5 mg by mouth daily.    Marland Kitchen HYDROcodone-acetaminophen (NORCO) 10-325 MG tablet Take 1 tablet by mouth 4 (four) times daily as needed (pain).     . isosorbide mononitrate (IMDUR) 30 MG 24 hr tablet Take 1 tablet (30 mg total) by mouth daily. 90 tablet 1  . LORazepam (ATIVAN) 2 MG tablet Take 2 mg by mouth at bedtime.     Marland Kitchen losartan (COZAAR) 100 MG tablet Take 100 mg by mouth daily.    . metoprolol tartrate (LOPRESSOR) 50 MG tablet TAKE ONE AND ONE-HALF TABLETS TWICE A DAY 270 tablet 3  . Milnacipran HCl (SAVELLA) 100 MG TABS tablet Take 100 mg by mouth 2 (two) times daily.    Marland Kitchen MOVANTIK 25 MG TABS tablet Take 1 tablet by mouth daily.    . nitroGLYCERIN (NITROSTAT) 0.4 MG SL tablet Place 1 tablet (0.4 mg total) under the tongue every 5 (five) minutes as needed for chest pain. (Patient not taking: Reported on 03/30/2018) 25 tablet 3  . ondansetron (ZOFRAN-ODT) 4 MG disintegrating tablet Take 1 tablet (4 mg total) by mouth every 6 (six) hours as needed for nausea or vomiting. 30 tablet 0  . pantoprazole (PROTONIX) 40 MG tablet Take 40 mg by mouth daily.     Marland Kitchen rOPINIRole (REQUIP) 1 MG tablet Take 1-2 mg by mouth See admin instructions. Take 2 tablets (2 mg) by mouth daily at bedtime, may also take 1 tablet (1 mg) in the afternoon as needed for restless legs    . rosuvastatin (CRESTOR) 10 MG tablet Take 1 tablet (10 mg total) by mouth daily. 90 tablet 3  . tiZANidine (ZANAFLEX) 4 MG tablet Take 4 mg by mouth every 8 (eight) hours as needed for muscle spasms.      No current facility-administered medications on file prior to visit.     Allergies  Allergen Reactions  . Norvasc [Amlodipine Besylate] Swelling  . Phenergan [Promethazine] Other  (See Comments)    Restlessness   . Timentin [Ticarcillin-Pot Clavulanate] Anaphylaxis    Has patient had a PCN reaction causing immediate rash, facial/tongue/throat swelling, SOB or lightheadedness with hypotension: Yes Has patient had a PCN reaction causing severe rash involving mucus membranes or skin necrosis: Yes Has patient had a PCN reaction that required hospitalization Yes Has patient had a PCN reaction occurring within the last 10 years: No If all of the above answers are "NO", then may proceed with Cephalosporin use.   . Pregabalin Other (See  Comments)    Mouth sores  . Reglan [Metoclopramide] Other (See Comments)    Cannot sit still     Assessment/Plan:  1. Hypertension -  BP is well controlled today; however, she is experiencing some LLE. Will cut back on dose of amlodipine to 2.5mg  daily to help with LLE. Will continue all other medications as prescribed due to concern for increased dizziness. Advised to monitor pressures and swelling on lower dose. Follow up in HTN clinic in 4-6 weeks to adjust medications if needed.   2. HLD - provided order for lipids/hepatic to be drawn with other labs today. Will await results for need to adjust therapy.    Thank you, Lelan Pons. Patterson Hammersmith, [place]  [address], [place], [place] [address]  Phone: [phone]; Fax: [phone] 06/09/2018 7:05 AM

## 2018-06-16 DIAGNOSIS — M5030 Other cervical disc degeneration, unspecified cervical region: Secondary | ICD-10-CM | POA: Diagnosis not present

## 2018-06-16 DIAGNOSIS — Z79891 Long term (current) use of opiate analgesic: Secondary | ICD-10-CM | POA: Diagnosis not present

## 2018-06-16 DIAGNOSIS — Z79899 Other long term (current) drug therapy: Secondary | ICD-10-CM | POA: Diagnosis not present

## 2018-06-16 DIAGNOSIS — G894 Chronic pain syndrome: Secondary | ICD-10-CM | POA: Diagnosis not present

## 2018-06-16 DIAGNOSIS — M961 Postlaminectomy syndrome, not elsewhere classified: Secondary | ICD-10-CM | POA: Diagnosis not present

## 2018-06-16 DIAGNOSIS — M5412 Radiculopathy, cervical region: Secondary | ICD-10-CM | POA: Diagnosis not present

## 2018-06-30 DIAGNOSIS — M4722 Other spondylosis with radiculopathy, cervical region: Secondary | ICD-10-CM | POA: Diagnosis not present

## 2018-06-30 DIAGNOSIS — M960 Pseudarthrosis after fusion or arthrodesis: Secondary | ICD-10-CM | POA: Diagnosis not present

## 2018-06-30 DIAGNOSIS — M4802 Spinal stenosis, cervical region: Secondary | ICD-10-CM | POA: Diagnosis not present

## 2018-06-30 DIAGNOSIS — M4712 Other spondylosis with myelopathy, cervical region: Secondary | ICD-10-CM | POA: Diagnosis not present

## 2018-07-02 DIAGNOSIS — R7303 Prediabetes: Secondary | ICD-10-CM | POA: Diagnosis not present

## 2018-07-02 DIAGNOSIS — K589 Irritable bowel syndrome without diarrhea: Secondary | ICD-10-CM | POA: Diagnosis not present

## 2018-07-02 DIAGNOSIS — G4700 Insomnia, unspecified: Secondary | ICD-10-CM | POA: Diagnosis not present

## 2018-07-02 DIAGNOSIS — Z980 Intestinal bypass and anastomosis status: Secondary | ICD-10-CM | POA: Diagnosis not present

## 2018-07-02 DIAGNOSIS — G2581 Restless legs syndrome: Secondary | ICD-10-CM | POA: Diagnosis not present

## 2018-07-02 DIAGNOSIS — E7800 Pure hypercholesterolemia, unspecified: Secondary | ICD-10-CM | POA: Diagnosis not present

## 2018-07-02 DIAGNOSIS — K219 Gastro-esophageal reflux disease without esophagitis: Secondary | ICD-10-CM | POA: Diagnosis not present

## 2018-07-02 DIAGNOSIS — M4712 Other spondylosis with myelopathy, cervical region: Secondary | ICD-10-CM | POA: Diagnosis not present

## 2018-07-02 DIAGNOSIS — Z96651 Presence of right artificial knee joint: Secondary | ICD-10-CM | POA: Diagnosis not present

## 2018-07-02 DIAGNOSIS — I10 Essential (primary) hypertension: Secondary | ICD-10-CM | POA: Diagnosis not present

## 2018-07-02 DIAGNOSIS — R9431 Abnormal electrocardiogram [ECG] [EKG]: Secondary | ICD-10-CM | POA: Diagnosis not present

## 2018-07-02 DIAGNOSIS — M4802 Spinal stenosis, cervical region: Secondary | ICD-10-CM | POA: Diagnosis not present

## 2018-07-02 DIAGNOSIS — K5900 Constipation, unspecified: Secondary | ICD-10-CM | POA: Diagnosis not present

## 2018-07-02 DIAGNOSIS — Z8701 Personal history of pneumonia (recurrent): Secondary | ICD-10-CM | POA: Diagnosis not present

## 2018-07-02 DIAGNOSIS — Z9181 History of falling: Secondary | ICD-10-CM | POA: Diagnosis not present

## 2018-07-02 DIAGNOSIS — I2510 Atherosclerotic heart disease of native coronary artery without angina pectoris: Secondary | ICD-10-CM | POA: Diagnosis not present

## 2018-07-02 DIAGNOSIS — M960 Pseudarthrosis after fusion or arthrodesis: Secondary | ICD-10-CM | POA: Diagnosis not present

## 2018-07-02 DIAGNOSIS — Z9221 Personal history of antineoplastic chemotherapy: Secondary | ICD-10-CM | POA: Diagnosis not present

## 2018-07-02 DIAGNOSIS — Z85038 Personal history of other malignant neoplasm of large intestine: Secondary | ICD-10-CM | POA: Diagnosis not present

## 2018-07-02 DIAGNOSIS — M797 Fibromyalgia: Secondary | ICD-10-CM | POA: Diagnosis not present

## 2018-07-02 DIAGNOSIS — F329 Major depressive disorder, single episode, unspecified: Secondary | ICD-10-CM | POA: Diagnosis not present

## 2018-07-02 DIAGNOSIS — Z923 Personal history of irradiation: Secondary | ICD-10-CM | POA: Diagnosis not present

## 2018-07-02 DIAGNOSIS — Z79899 Other long term (current) drug therapy: Secondary | ICD-10-CM | POA: Diagnosis not present

## 2018-07-02 DIAGNOSIS — K3184 Gastroparesis: Secondary | ICD-10-CM | POA: Diagnosis not present

## 2018-07-02 DIAGNOSIS — Z7982 Long term (current) use of aspirin: Secondary | ICD-10-CM | POA: Diagnosis not present

## 2018-07-02 DIAGNOSIS — F419 Anxiety disorder, unspecified: Secondary | ICD-10-CM | POA: Diagnosis not present

## 2018-07-06 DIAGNOSIS — Z9181 History of falling: Secondary | ICD-10-CM | POA: Diagnosis not present

## 2018-07-06 DIAGNOSIS — Z8701 Personal history of pneumonia (recurrent): Secondary | ICD-10-CM | POA: Diagnosis not present

## 2018-07-06 DIAGNOSIS — K5900 Constipation, unspecified: Secondary | ICD-10-CM | POA: Diagnosis not present

## 2018-07-06 DIAGNOSIS — I2510 Atherosclerotic heart disease of native coronary artery without angina pectoris: Secondary | ICD-10-CM | POA: Diagnosis not present

## 2018-07-06 DIAGNOSIS — Z85038 Personal history of other malignant neoplasm of large intestine: Secondary | ICD-10-CM | POA: Diagnosis not present

## 2018-07-06 DIAGNOSIS — K589 Irritable bowel syndrome without diarrhea: Secondary | ICD-10-CM | POA: Diagnosis not present

## 2018-07-06 DIAGNOSIS — M797 Fibromyalgia: Secondary | ICD-10-CM | POA: Diagnosis not present

## 2018-07-06 DIAGNOSIS — R200 Anesthesia of skin: Secondary | ICD-10-CM | POA: Diagnosis not present

## 2018-07-06 DIAGNOSIS — G4700 Insomnia, unspecified: Secondary | ICD-10-CM | POA: Diagnosis not present

## 2018-07-06 DIAGNOSIS — Z96651 Presence of right artificial knee joint: Secondary | ICD-10-CM | POA: Diagnosis not present

## 2018-07-06 DIAGNOSIS — M4722 Other spondylosis with radiculopathy, cervical region: Secondary | ICD-10-CM | POA: Diagnosis not present

## 2018-07-06 DIAGNOSIS — M4712 Other spondylosis with myelopathy, cervical region: Secondary | ICD-10-CM | POA: Diagnosis not present

## 2018-07-06 DIAGNOSIS — Z980 Intestinal bypass and anastomosis status: Secondary | ICD-10-CM | POA: Diagnosis not present

## 2018-07-06 DIAGNOSIS — Z79899 Other long term (current) drug therapy: Secondary | ICD-10-CM | POA: Diagnosis not present

## 2018-07-06 DIAGNOSIS — M960 Pseudarthrosis after fusion or arthrodesis: Secondary | ICD-10-CM | POA: Diagnosis not present

## 2018-07-06 DIAGNOSIS — K3184 Gastroparesis: Secondary | ICD-10-CM | POA: Diagnosis not present

## 2018-07-06 DIAGNOSIS — E7800 Pure hypercholesterolemia, unspecified: Secondary | ICD-10-CM | POA: Diagnosis not present

## 2018-07-06 DIAGNOSIS — Z981 Arthrodesis status: Secondary | ICD-10-CM | POA: Diagnosis not present

## 2018-07-06 DIAGNOSIS — Z923 Personal history of irradiation: Secondary | ICD-10-CM | POA: Diagnosis not present

## 2018-07-06 DIAGNOSIS — R7303 Prediabetes: Secondary | ICD-10-CM | POA: Diagnosis not present

## 2018-07-06 DIAGNOSIS — M4802 Spinal stenosis, cervical region: Secondary | ICD-10-CM | POA: Diagnosis not present

## 2018-07-06 DIAGNOSIS — Z9221 Personal history of antineoplastic chemotherapy: Secondary | ICD-10-CM | POA: Diagnosis not present

## 2018-07-06 DIAGNOSIS — K219 Gastro-esophageal reflux disease without esophagitis: Secondary | ICD-10-CM | POA: Diagnosis not present

## 2018-07-06 DIAGNOSIS — F329 Major depressive disorder, single episode, unspecified: Secondary | ICD-10-CM | POA: Diagnosis not present

## 2018-07-06 DIAGNOSIS — Z7982 Long term (current) use of aspirin: Secondary | ICD-10-CM | POA: Diagnosis not present

## 2018-07-06 DIAGNOSIS — G2581 Restless legs syndrome: Secondary | ICD-10-CM | POA: Diagnosis not present

## 2018-07-06 DIAGNOSIS — F419 Anxiety disorder, unspecified: Secondary | ICD-10-CM | POA: Diagnosis not present

## 2018-07-06 DIAGNOSIS — I10 Essential (primary) hypertension: Secondary | ICD-10-CM | POA: Diagnosis not present

## 2018-07-14 DIAGNOSIS — G894 Chronic pain syndrome: Secondary | ICD-10-CM | POA: Diagnosis not present

## 2018-07-14 DIAGNOSIS — M542 Cervicalgia: Secondary | ICD-10-CM | POA: Diagnosis not present

## 2018-07-14 DIAGNOSIS — M5412 Radiculopathy, cervical region: Secondary | ICD-10-CM | POA: Diagnosis not present

## 2018-07-14 DIAGNOSIS — M961 Postlaminectomy syndrome, not elsewhere classified: Secondary | ICD-10-CM | POA: Diagnosis not present

## 2018-08-31 DIAGNOSIS — Z79899 Other long term (current) drug therapy: Secondary | ICD-10-CM | POA: Diagnosis not present

## 2018-08-31 DIAGNOSIS — G894 Chronic pain syndrome: Secondary | ICD-10-CM | POA: Diagnosis not present

## 2018-08-31 DIAGNOSIS — M961 Postlaminectomy syndrome, not elsewhere classified: Secondary | ICD-10-CM | POA: Diagnosis not present

## 2018-08-31 DIAGNOSIS — M5030 Other cervical disc degeneration, unspecified cervical region: Secondary | ICD-10-CM | POA: Diagnosis not present

## 2018-08-31 DIAGNOSIS — M797 Fibromyalgia: Secondary | ICD-10-CM | POA: Diagnosis not present

## 2018-08-31 DIAGNOSIS — Z79891 Long term (current) use of opiate analgesic: Secondary | ICD-10-CM | POA: Diagnosis not present

## 2018-09-29 DIAGNOSIS — M961 Postlaminectomy syndrome, not elsewhere classified: Secondary | ICD-10-CM | POA: Diagnosis not present

## 2018-09-29 DIAGNOSIS — M25519 Pain in unspecified shoulder: Secondary | ICD-10-CM | POA: Diagnosis not present

## 2018-09-29 DIAGNOSIS — M797 Fibromyalgia: Secondary | ICD-10-CM | POA: Diagnosis not present

## 2018-09-29 DIAGNOSIS — G894 Chronic pain syndrome: Secondary | ICD-10-CM | POA: Diagnosis not present

## 2018-11-01 DIAGNOSIS — Z79891 Long term (current) use of opiate analgesic: Secondary | ICD-10-CM | POA: Diagnosis not present

## 2018-11-01 DIAGNOSIS — M25519 Pain in unspecified shoulder: Secondary | ICD-10-CM | POA: Diagnosis not present

## 2018-11-01 DIAGNOSIS — M961 Postlaminectomy syndrome, not elsewhere classified: Secondary | ICD-10-CM | POA: Diagnosis not present

## 2018-11-01 DIAGNOSIS — Z79899 Other long term (current) drug therapy: Secondary | ICD-10-CM | POA: Diagnosis not present

## 2018-11-01 DIAGNOSIS — M797 Fibromyalgia: Secondary | ICD-10-CM | POA: Diagnosis not present

## 2018-11-01 DIAGNOSIS — G894 Chronic pain syndrome: Secondary | ICD-10-CM | POA: Diagnosis not present

## 2018-11-05 DIAGNOSIS — R05 Cough: Secondary | ICD-10-CM | POA: Diagnosis not present

## 2018-11-05 DIAGNOSIS — J069 Acute upper respiratory infection, unspecified: Secondary | ICD-10-CM | POA: Diagnosis not present

## 2018-11-05 DIAGNOSIS — I10 Essential (primary) hypertension: Secondary | ICD-10-CM | POA: Diagnosis not present

## 2018-11-30 ENCOUNTER — Emergency Department (HOSPITAL_COMMUNITY): Payer: Medicare Other

## 2018-11-30 ENCOUNTER — Other Ambulatory Visit: Payer: Self-pay

## 2018-11-30 ENCOUNTER — Inpatient Hospital Stay (HOSPITAL_COMMUNITY)
Admission: EM | Admit: 2018-11-30 | Discharge: 2018-12-03 | DRG: 684 | Disposition: A | Discharge status: Home / Self Care | Payer: Medicare Other | Attending: Internal Medicine | Admitting: Internal Medicine

## 2018-11-30 ENCOUNTER — Encounter (HOSPITAL_COMMUNITY): Payer: Self-pay | Admitting: Emergency Medicine

## 2018-11-30 DIAGNOSIS — N179 Acute kidney failure, unspecified: Secondary | ICD-10-CM | POA: Diagnosis not present

## 2018-11-30 DIAGNOSIS — Z8614 Personal history of Methicillin resistant Staphylococcus aureus infection: Secondary | ICD-10-CM

## 2018-11-30 DIAGNOSIS — Z79891 Long term (current) use of opiate analgesic: Secondary | ICD-10-CM

## 2018-11-30 DIAGNOSIS — F419 Anxiety disorder, unspecified: Secondary | ICD-10-CM | POA: Diagnosis present

## 2018-11-30 DIAGNOSIS — E785 Hyperlipidemia, unspecified: Secondary | ICD-10-CM | POA: Diagnosis present

## 2018-11-30 DIAGNOSIS — I444 Left anterior fascicular block: Secondary | ICD-10-CM | POA: Diagnosis present

## 2018-11-30 DIAGNOSIS — Z833 Family history of diabetes mellitus: Secondary | ICD-10-CM | POA: Diagnosis not present

## 2018-11-30 DIAGNOSIS — R1110 Vomiting, unspecified: Secondary | ICD-10-CM | POA: Diagnosis not present

## 2018-11-30 DIAGNOSIS — Z888 Allergy status to other drugs, medicaments and biological substances status: Secondary | ICD-10-CM | POA: Diagnosis not present

## 2018-11-30 DIAGNOSIS — R112 Nausea with vomiting, unspecified: Secondary | ICD-10-CM | POA: Diagnosis not present

## 2018-11-30 DIAGNOSIS — Z8249 Family history of ischemic heart disease and other diseases of the circulatory system: Secondary | ICD-10-CM

## 2018-11-30 DIAGNOSIS — E876 Hypokalemia: Secondary | ICD-10-CM | POA: Diagnosis not present

## 2018-11-30 DIAGNOSIS — K219 Gastro-esophageal reflux disease without esophagitis: Secondary | ICD-10-CM | POA: Diagnosis present

## 2018-11-30 DIAGNOSIS — E860 Dehydration: Secondary | ICD-10-CM | POA: Diagnosis present

## 2018-11-30 DIAGNOSIS — Z823 Family history of stroke: Secondary | ICD-10-CM | POA: Diagnosis not present

## 2018-11-30 DIAGNOSIS — G2581 Restless legs syndrome: Secondary | ICD-10-CM | POA: Diagnosis present

## 2018-11-30 DIAGNOSIS — Z1159 Encounter for screening for other viral diseases: Secondary | ICD-10-CM | POA: Diagnosis not present

## 2018-11-30 DIAGNOSIS — Z7982 Long term (current) use of aspirin: Secondary | ICD-10-CM | POA: Diagnosis not present

## 2018-11-30 DIAGNOSIS — M6281 Muscle weakness (generalized): Secondary | ICD-10-CM | POA: Diagnosis not present

## 2018-11-30 DIAGNOSIS — Z85038 Personal history of other malignant neoplasm of large intestine: Secondary | ICD-10-CM | POA: Diagnosis not present

## 2018-11-30 DIAGNOSIS — I10 Essential (primary) hypertension: Secondary | ICD-10-CM | POA: Diagnosis present

## 2018-11-30 DIAGNOSIS — Z79899 Other long term (current) drug therapy: Secondary | ICD-10-CM

## 2018-11-30 DIAGNOSIS — Z66 Do not resuscitate: Secondary | ICD-10-CM | POA: Diagnosis present

## 2018-11-30 DIAGNOSIS — M797 Fibromyalgia: Secondary | ICD-10-CM | POA: Diagnosis present

## 2018-11-30 DIAGNOSIS — G894 Chronic pain syndrome: Secondary | ICD-10-CM | POA: Diagnosis present

## 2018-11-30 DIAGNOSIS — F329 Major depressive disorder, single episode, unspecified: Secondary | ICD-10-CM | POA: Diagnosis present

## 2018-11-30 LAB — CBC WITH DIFFERENTIAL/PLATELET
Abs Immature Granulocytes: 0.04 10*3/uL (ref 0.00–0.07)
Basophils Absolute: 0 10*3/uL (ref 0.0–0.1)
Basophils Relative: 0 %
Eosinophils Absolute: 0 10*3/uL (ref 0.0–0.5)
Eosinophils Relative: 0 %
HCT: 45.6 % (ref 36.0–46.0)
Hemoglobin: 15.4 g/dL — ABNORMAL HIGH (ref 12.0–15.0)
Immature Granulocytes: 0 %
Lymphocytes Relative: 21 %
Lymphs Abs: 1.9 10*3/uL (ref 0.7–4.0)
MCH: 29.9 pg (ref 26.0–34.0)
MCHC: 33.8 g/dL (ref 30.0–36.0)
MCV: 88.5 fL (ref 80.0–100.0)
Monocytes Absolute: 0.4 10*3/uL (ref 0.1–1.0)
Monocytes Relative: 5 %
Neutro Abs: 6.6 10*3/uL (ref 1.7–7.7)
Neutrophils Relative %: 74 %
Platelets: 319 10*3/uL (ref 150–400)
RBC: 5.15 MIL/uL — ABNORMAL HIGH (ref 3.87–5.11)
RDW: 12.6 % (ref 11.5–15.5)
WBC: 9 10*3/uL (ref 4.0–10.5)
nRBC: 0 % (ref 0.0–0.2)

## 2018-11-30 LAB — URINALYSIS, ROUTINE W REFLEX MICROSCOPIC
Bacteria, UA: NONE SEEN
Bilirubin Urine: NEGATIVE
Glucose, UA: 50 mg/dL — AB
Hgb urine dipstick: NEGATIVE
Ketones, ur: 20 mg/dL — AB
Nitrite: NEGATIVE
Protein, ur: NEGATIVE mg/dL
Specific Gravity, Urine: 1.026 (ref 1.005–1.030)
pH: 6 (ref 5.0–8.0)

## 2018-11-30 LAB — COMPREHENSIVE METABOLIC PANEL
ALT: 23 U/L (ref 0–44)
AST: 24 U/L (ref 15–41)
Albumin: 4.6 g/dL (ref 3.5–5.0)
Alkaline Phosphatase: 99 U/L (ref 38–126)
Anion gap: 14 (ref 5–15)
BUN: 24 mg/dL — ABNORMAL HIGH (ref 8–23)
CO2: 25 mmol/L (ref 22–32)
Calcium: 9.7 mg/dL (ref 8.9–10.3)
Chloride: 98 mmol/L (ref 98–111)
Creatinine, Ser: 1.5 mg/dL — ABNORMAL HIGH (ref 0.44–1.00)
GFR calc Af Amer: 40 mL/min — ABNORMAL LOW (ref >60)
GFR calc non Af Amer: 34 mL/min — ABNORMAL LOW (ref >60)
Glucose, Bld: 148 mg/dL — ABNORMAL HIGH (ref 70–99)
Potassium: 3.4 mmol/L — ABNORMAL LOW (ref 3.5–5.1)
Sodium: 137 mmol/L (ref 135–145)
Total Bilirubin: 0.8 mg/dL (ref 0.3–1.2)
Total Protein: 7.7 g/dL (ref 6.5–8.1)

## 2018-11-30 LAB — SARS CORONAVIRUS 2 BY RT PCR (HOSPITAL ORDER, PERFORMED IN ~~LOC~~ HOSPITAL LAB): SARS Coronavirus 2: NEGATIVE

## 2018-11-30 LAB — LACTIC ACID, PLASMA
Lactic Acid, Venous: 2.1 mmol/L (ref 0.5–1.9)
Lactic Acid, Venous: 1.6 mmol/L (ref 0.5–1.9)

## 2018-11-30 LAB — LIPASE, BLOOD: Lipase: 43 U/L (ref 11–51)

## 2018-11-30 MED ORDER — ROSUVASTATIN CALCIUM 5 MG PO TABS
10.0000 mg | ORAL_TABLET | Freq: Every day | ORAL | Status: DC
  Administered 2018-12-01 – 2018-12-03 (×3): 10 mg via ORAL
  Filled 2018-11-30 (×3): qty 2

## 2018-11-30 MED ORDER — MORPHINE SULFATE (PF) 4 MG/ML IV SOLN
4.0000 mg | Freq: Once | INTRAVENOUS | Status: AC
  Administered 2018-11-30: 4 mg via INTRAVENOUS
  Filled 2018-11-30: qty 1

## 2018-11-30 MED ORDER — PANTOPRAZOLE SODIUM 40 MG PO TBEC
40.0000 mg | DELAYED_RELEASE_TABLET | Freq: Every day | ORAL | Status: DC
  Administered 2018-12-01 – 2018-12-03 (×3): 40 mg via ORAL
  Filled 2018-11-30 (×4): qty 1

## 2018-11-30 MED ORDER — LABETALOL HCL 5 MG/ML IV SOLN
20.0000 mg | Freq: Once | INTRAVENOUS | Status: AC
  Administered 2018-11-30: 20 mg via INTRAVENOUS
  Filled 2018-11-30: qty 4

## 2018-11-30 MED ORDER — IOPAMIDOL (ISOVUE-300) INJECTION 61%
80.0000 mL | Freq: Once | INTRAVENOUS | Status: AC | PRN
  Administered 2018-11-30: 80 mL via INTRAVENOUS

## 2018-11-30 MED ORDER — METOPROLOL TARTRATE 100 MG PO TABS
100.0000 mg | ORAL_TABLET | Freq: Two times a day (BID) | ORAL | Status: DC
  Administered 2018-11-30 – 2018-12-01 (×2): 100 mg via ORAL
  Filled 2018-11-30: qty 2
  Filled 2018-11-30: qty 1
  Filled 2018-11-30: qty 4
  Filled 2018-11-30: qty 2

## 2018-11-30 MED ORDER — HYDRALAZINE HCL 20 MG/ML IJ SOLN: 10.0000 mg | INTRAMUSCULAR | Status: DC

## 2018-11-30 MED ORDER — ACETAMINOPHEN 650 MG RE SUPP: 650.0000 mg | Freq: Four times a day (QID) | RECTAL | Status: DC | PRN

## 2018-11-30 MED ORDER — ONDANSETRON HCL 4 MG PO TABS: 4.0000 mg | ORAL_TABLET | Freq: Four times a day (QID) | ORAL | Status: DC | PRN

## 2018-11-30 MED ORDER — ASPIRIN EC 81 MG PO TBEC
81.0000 mg | DELAYED_RELEASE_TABLET | Freq: Every day | ORAL | Status: DC
  Administered 2018-12-01 – 2018-12-03 (×3): 81 mg via ORAL
  Filled 2018-11-30 (×4): qty 1

## 2018-11-30 MED ORDER — LORAZEPAM 1 MG PO TABS
2.0000 mg | ORAL_TABLET | Freq: Every day | ORAL | Status: DC
  Administered 2018-12-01 – 2018-12-02 (×2): 2 mg via ORAL
  Filled 2018-11-30 (×2): qty 4
  Filled 2018-11-30: qty 2

## 2018-11-30 MED ORDER — ISOSORBIDE MONONITRATE ER 30 MG PO TB24
30.0000 mg | ORAL_TABLET | Freq: Every day | ORAL | Status: DC
  Administered 2018-12-01 – 2018-12-03 (×3): 30 mg via ORAL
  Filled 2018-11-30 (×3): qty 1

## 2018-11-30 MED ORDER — HYDROCODONE-ACETAMINOPHEN 10-325 MG PO TABS
1.0000 | ORAL_TABLET | Freq: Four times a day (QID) | ORAL | Status: DC | PRN
  Administered 2018-12-01 – 2018-12-03 (×5): 1 via ORAL
  Filled 2018-11-30 (×6): qty 1

## 2018-11-30 MED ORDER — DULOXETINE HCL 20 MG PO CPEP: 20.0000 mg | ORAL_CAPSULE | Freq: Two times a day (BID) | ORAL | Status: DC

## 2018-11-30 MED ORDER — SODIUM CHLORIDE 0.9 % IV BOLUS
1000.0000 mL | Freq: Once | INTRAVENOUS | Status: AC
  Administered 2018-11-30: 1000 mL via INTRAVENOUS

## 2018-11-30 MED ORDER — LORAZEPAM 2 MG/ML IJ SOLN
1.0000 mg | Freq: Once | INTRAMUSCULAR | Status: AC
  Administered 2018-11-30: 1 mg via INTRAVENOUS
  Filled 2018-11-30: qty 1

## 2018-11-30 MED ORDER — LORAZEPAM 2 MG/ML IJ SOLN
1.0000 mg | INTRAMUSCULAR | Status: DC | PRN
  Administered 2018-11-30 – 2018-12-01 (×4): 1 mg via INTRAVENOUS
  Filled 2018-11-30 (×4): qty 1

## 2018-11-30 MED ORDER — HYDRALAZINE HCL 20 MG/ML IJ SOLN
5.0000 mg | INTRAMUSCULAR | Status: DC | PRN
  Administered 2018-11-30 (×2): 5 mg via INTRAVENOUS
  Filled 2018-11-30 (×2): qty 1

## 2018-11-30 MED ORDER — HYDRALAZINE HCL 20 MG/ML IJ SOLN
10.0000 mg | INTRAMUSCULAR | Status: AC
  Administered 2018-11-30: 10 mg via INTRAVENOUS
  Filled 2018-11-30: qty 1

## 2018-11-30 MED ORDER — MORPHINE SULFATE (PF) 2 MG/ML IV SOLN
2.0000 mg | INTRAVENOUS | Status: DC | PRN
  Administered 2018-11-30 (×2): 2 mg via INTRAVENOUS
  Filled 2018-11-30 (×2): qty 1

## 2018-11-30 MED ORDER — SODIUM CHLORIDE 0.9% FLUSH
3.0000 mL | Freq: Once | INTRAVENOUS | Status: AC
  Administered 2018-11-30: 3 mL via INTRAVENOUS

## 2018-11-30 MED ORDER — ONDANSETRON HCL 4 MG/2ML IJ SOLN
4.0000 mg | Freq: Once | INTRAMUSCULAR | Status: AC
  Administered 2018-11-30: 4 mg via INTRAVENOUS
  Filled 2018-11-30: qty 2

## 2018-11-30 MED ORDER — MILNACIPRAN HCL 50 MG PO TABS
100.0000 mg | ORAL_TABLET | Freq: Two times a day (BID) | ORAL | Status: DC
  Administered 2018-11-30 – 2018-12-03 (×6): 100 mg via ORAL
  Filled 2018-11-30 (×8): qty 2

## 2018-11-30 MED ORDER — ESCITALOPRAM OXALATE 10 MG PO TABS
10.0000 mg | ORAL_TABLET | Freq: Every day | ORAL | Status: DC
  Administered 2018-12-01 – 2018-12-03 (×3): 10 mg via ORAL
  Filled 2018-11-30 (×3): qty 1

## 2018-11-30 MED ORDER — SODIUM CHLORIDE 0.9 % IV SOLN
INTRAVENOUS | Status: DC
  Administered 2018-11-30 – 2018-12-03 (×5): via INTRAVENOUS

## 2018-11-30 MED ORDER — DULOXETINE HCL 20 MG PO CPEP
20.0000 mg | ORAL_CAPSULE | Freq: Two times a day (BID) | ORAL | Status: DC
  Administered 2018-11-30 – 2018-12-03 (×6): 20 mg via ORAL
  Filled 2018-11-30 (×8): qty 1

## 2018-11-30 MED ORDER — ACETAMINOPHEN 325 MG PO TABS
650.0000 mg | ORAL_TABLET | Freq: Four times a day (QID) | ORAL | Status: DC | PRN
  Administered 2018-12-02: 650 mg via ORAL
  Filled 2018-11-30: qty 2

## 2018-11-30 MED ORDER — ONDANSETRON HCL 4 MG/2ML IJ SOLN
4.0000 mg | Freq: Four times a day (QID) | INTRAMUSCULAR | Status: DC | PRN
  Administered 2018-12-02: 4 mg via INTRAVENOUS
  Filled 2018-11-30 (×2): qty 2

## 2018-11-30 MED ORDER — ROPINIROLE HCL 1 MG PO TABS
2.0000 mg | ORAL_TABLET | Freq: Every day | ORAL | Status: DC
  Administered 2018-11-30 – 2018-12-02 (×3): 2 mg via ORAL
  Filled 2018-11-30 (×3): qty 2

## 2018-11-30 MED ORDER — TIZANIDINE HCL 2 MG PO TABS
4.0000 mg | ORAL_TABLET | Freq: Three times a day (TID) | ORAL | Status: DC | PRN
  Administered 2018-11-30 – 2018-12-03 (×3): 4 mg via ORAL
  Filled 2018-11-30 (×2): qty 1
  Filled 2018-11-30 (×2): qty 2

## 2018-11-30 MED ORDER — ROPINIROLE HCL 1 MG PO TABS: 1.0000 mg | ORAL_TABLET | Freq: Every day | ORAL | Status: DC | PRN

## 2018-11-30 MED ORDER — ENOXAPARIN SODIUM 40 MG/0.4ML ~~LOC~~ SOLN
40.0000 mg | SUBCUTANEOUS | Status: DC
  Administered 2018-12-01 – 2018-12-03 (×3): 40 mg via SUBCUTANEOUS
  Filled 2018-11-30 (×4): qty 0.4

## 2018-11-30 NOTE — ED Notes (Signed)
Pt to CT scan with continued dry heaves noted.  Medicated for pain.  Alert and oriented.  IV patent.

## 2018-11-30 NOTE — H&P (Signed)
History and Physical    Angela [name] EYC:[id number] DOB: 07/13/1945 DOA: 11/30/2018  PCP: [name], MD Consultants:  Donna Christen - GI; Vira Blanco - pain; Gerilyn Nestle - neurosurgery Patient coming from:  Home - lives with husband and daughter; Donald Prose: Husband, [phone]  Chief Complaint: N/v  HPI: Angela [name] is a 73 y.o. female with medical history significant of RLS; MRSA septicemia; HTN; HLD; and sigmoid CA presenting with n/v.  She reports having periodic spells like this once or twice a year where she develops n/v. It progresses to bilious emesis and then eventually improves.  She has been taking Zofran at home without improvement since symptoms started Thursday evening.  She has had a few loose stools over the last few days but primarily the issue is n/v.  She has not tolerated PO intake.  She has been feeling dizzy.  She was seeing spots but this has improved with IVF in the ER.   ED Course:  N/V, abdominal pain. H/o IBS, gastroparesis.  Has required admission for dehydration in the past.  Negative CT.  Ongoing vomiting, tachycardia.  Temp 100.  Elevated lactate, low suspicion for sepsis.  Marked HTN, treating with hydralazine.  Review of Systems: As per HPI; otherwise review of systems reviewed and negative.   Ambulatory Status:  Ambulates without assistance  Past Medical History:  Diagnosis Date   Anxiety    Cancer of sigmoid ([place])    Depression (emotion)    DJD (degenerative joint disease)    Fibromyalgia    GERD (gastroesophageal reflux disease)    Hemorrhoids    Hyperlipidemia    Hypertension    IBS (irritable bowel syndrome)    MRSA (methicillin resistant Staphylococcus aureus) septicemia (HCC)    history of   Over weight    Pancreatitis    RLS (restless legs syndrome)    Tachycardia     Past Surgical History:  Procedure Laterality Date   CARDIAC CATHETERIZATION [name] 12/26/2015   Procedure: Left Heart Cath and Coronary Angiography;  Surgeon: Troy Sine, MD;  Location: [facility];  Service: Cardiovascular;  Laterality: [name];   child birth      Social History   Socioeconomic History   Marital status: Married    Spouse name: Not on file   Number of children: Not on file   Years of education: Not on file   Highest education level: Not on file  Occupational History   Not on file  Social Needs   Financial resource strain: Not on file   Food insecurity    Worry: Not on file    Inability: Not on file   Transportation needs    Medical: Not on file    Non-medical: Not on file  Tobacco Use   Smoking status: Never Smoker   Smokeless tobacco: Never Used  Substance and Sexual Activity   Alcohol use: No    Alcohol/week: 0.0 standard drinks   Drug use: No   Sexual activity: Not on file  Lifestyle   Physical activity    Days per week: Not on file    Minutes per session: Not on file   Stress: Not on file  Relationships   Social connections    Talks on phone: Not on file    Gets together: Not on file    Attends religious service: Not on file    Active member of club or organization: Not on file    Attends meetings of clubs or organizations: Not on  file    Relationship status: Not on file   Intimate partner violence    Fear of current or ex partner: Not on file    Emotionally abused: Not on file    Physically abused: Not on file    Forced sexual activity: Not on file  Other Topics Concern   Not on file  Social History Narrative   Not on file    Allergies  Allergen Reactions   Norvasc [Amlodipine Besylate] Swelling   Phenergan [Promethazine] Other (See Comments)    Restlessness    Timentin [Ticarcillin-Pot Clavulanate] Anaphylaxis    Has patient had a PCN reaction causing immediate rash, facial/tongue/throat swelling, SOB or lightheadedness with hypotension: Yes Has patient had a PCN reaction causing severe rash involving mucus membranes or skin necrosis: Yes Has patient had a PCN  reaction that required hospitalization Yes Has patient had a PCN reaction occurring within the last 10 years: No If all of the above answers are "NO", then may proceed with Cephalosporin use.    Pregabalin Other (See Comments)    Mouth sores   Reglan [Metoclopramide] Other (See Comments)    Cannot sit still    Family History  Problem Relation Age of Onset   Hypertension Mother    Heart disease Mother    Heart attack Father    Stroke Father    Diabetes Sister    Hypertension Sister    Stroke Other    Diabetes Other    Heart disease Other     Prior to Admission medications   Medication Sig Start Date End Date Taking? Authorizing Provider  aspirin EC 81 MG tablet Take 81 mg by mouth daily.   Yes [provider]  Cyanocobalamin (B-12) 2500 MCG TABS Take 2,500 mcg by mouth daily.    Yes [provider]  DULoxetine (CYMBALTA) 20 MG capsule Take 20 mg by mouth 2 (two) times daily.  03/26/17  Yes [provider]  escitalopram (LEXAPRO) 10 MG tablet Take 10 mg by mouth daily. 11/18/18  Yes [provider]  fluticasone (FLONASE) 50 MCG/ACT nasal spray Place 2 sprays into both nostrils daily as needed (seasonal allergies).    Yes [provider]  hydrochlorothiazide (HYDRODIURIL) 25 MG tablet Take 12.5 mg by mouth daily. 10/15/17  Yes [provider]  HYDROcodone-acetaminophen (NORCO) 10-325 MG tablet Take 1 tablet by mouth 4 (four) times daily as needed (pain).  05/19/17  Yes [provider]  isosorbide mononitrate (IMDUR) 30 MG 24 hr tablet Take 1 tablet (30 mg total) by mouth daily. 07/08/16 11/30/18 Yes Simmons, Brittainy M, PA-C  losartan (COZAAR) 100 MG tablet Take 100 mg by mouth daily.   Yes [provider]  metoprolol tartrate (LOPRESSOR) 50 MG tablet TAKE ONE AND ONE-HALF TABLETS TWICE A DAY Patient taking differently: Take 100 mg by mouth 2 (two) times daily.  04/02/18  Yes [name] M, PA-C    Milnacipran HCl (SAVELLA) 100 MG TABS tablet Take 100 mg by mouth 2 (two) times daily.   Yes [provider]  MOVANTIK 25 MG TABS tablet Take 1 tablet by mouth daily. 07/27/17  Yes [provider]  nitroGLYCERIN (NITROSTAT) 0.4 MG SL tablet Place 1 tablet (0.4 mg total) under the tongue every 5 (five) minutes as needed for chest pain. 01/08/16 11/30/18 Yes Imogene Burn, PA-C  ondansetron (ZOFRAN-ODT) 4 MG disintegrating tablet Take 1 tablet (4 mg total) by mouth every 6 (six) hours as needed for nausea or vomiting. 06/02/17  Yes Mariel Aloe, MD  pantoprazole (PROTONIX) 40 MG tablet Take 40 mg by mouth daily.  06/04/16  Yes [provider]  rOPINIRole (REQUIP) 1 MG tablet Take 1-2 mg by mouth See admin instructions. Take 2 tablets (2 mg) by mouth daily at bedtime, may also take 1 tablet (1 mg) in the afternoon as needed for restless legs   Yes [provider]  rosuvastatin (CRESTOR) 10 MG tablet Take 1 tablet (10 mg total) by mouth daily. 01/22/18  Yes Dorothy Spark, MD  tiZANidine (ZANAFLEX) 4 MG tablet Take 4 mg by mouth every 8 (eight) hours as needed for muscle spasms.  04/21/17  Yes [provider]  amLODipine (NORVASC) 2.5 MG tablet Take 2 tablets (5 mg total) by mouth daily. Patient not taking: Reported on 11/30/2018 03/30/18   Dorothy Spark, MD  LORazepam (ATIVAN) 2 MG tablet Take 2 mg by mouth at bedtime.     [provider]    Physical Exam: Vitals:   11/30/18 1333 11/30/18 1433 11/30/18 1436 11/30/18 1859  BP: (!) 176/77 (!) 200/94 (!) 191/97 (!) 206/92  Pulse: 98 (!) 106  (!) 105  Resp: (!) 23 (!) 22  20  Temp:  99.9 F (37.7 C)  99.5 F (37.5 C)  TempSrc:  Oral  Oral  SpO2: 98% 100%  100%  Weight:  79.8 kg    Height:  5\' 6"  (1.676 m)        General:  Appears calm and comfortable and is NAD  Eyes:  PERRL, EOMI, normal lids, iris  ENT:  grossly normal hearing, lips & tongue, mildly dry mm  Neck:  no LAD,  masses or thyromegaly  Cardiovascular:  RR with mild tachycardia, no m/r/g. No LE edema.   Respiratory:   CTA bilaterally with no wheezes/rales/rhonchi.  Normal respiratory effort.  Abdomen:  soft, diffusely TTP, ND, NABS  Back:   normal alignment, no CVAT  Skin:  no rash or induration seen on limited exam  Musculoskeletal:  grossly normal tone BUE/BLE, good ROM, no bony abnormality  Psychiatric:  grossly normal mood and affect, speech fluent and appropriate, AOx3  Neurologic:  CN 2-12 grossly intact, moves all extremities in coordinated fashion, sensation intact    Radiological Exams on Admission: Ct Abdomen Pelvis W Contrast  Result Date: 11/30/2018 CLINICAL DATA:  Acute generalized abdominal pain, nausea and vomiting since Thursday night, history hypertension, irritable bowel syndrome, colon cancer EXAM: CT ABDOMEN AND PELVIS WITH CONTRAST TECHNIQUE: Multidetector CT imaging of the abdomen and pelvis was performed using the standard protocol following bolus administration of intravenous contrast. Sagittal and coronal MPR images reconstructed from axial data set. CONTRAST:  80 cc Isovue-300 IV COMPARISON:  05/29/2017, 05/16/2016 FINDINGS: Lower chest: Minimal subsegmental atelectasis at RIGHT lower lobe. Nodular density LEFT major fissure 4 mm diameter image 7 unchanged. Hepatobiliary: Gallbladder surgically absent. Focal fatty infiltration of liver adjacent to falciform fissure. Liver otherwise unremarkable. Pancreas: Normal appearance Spleen: Normal appearance Adrenals/Urinary Tract: Adrenal glands normal appearance. Small BILATERAL renal cysts without solid renal mass or hydronephrosis. No urinary tract calcification or dilatation. Ureters and bladder unremarkable Stomach/Bowel: Small hiatal hernia. Anastomotic staple line at descending sigmoid junction. Appendix not visualized but no pericecal inflammatory process seen. Stomach and bowel loops otherwise normal appearance  Vascular/Lymphatic: Atherosclerotic calcifications aorta and iliac arteries without aneurysm. No adenopathy. Reproductive: Uterus surgically absent with nonvisualization of LEFT ovary. RIGHT ovary normal appearance Other: No free air or free fluid. Small periumbilical hernia containing  fat. No acute inflammatory process identified. Musculoskeletal: Unremarkable IMPRESSION: No acute intra-abdominal or intrapelvic abnormalities. Small hiatal hernia. Small periumbilical hernia containing fat. Stable 4 mm diameter nodular density at LEFT major fissure unchanged since 2017. Electronically Signed   By: Lavonia Dana M.D.   On: 11/30/2018 07:55    EKG: Independently reviewed.  NSR with rate 72; nonspecific ST changes with no evidence of acute ischemia; NSCSLT   Labs on Admission: I have personally reviewed the available labs and imaging studies at the time of the admission.  Pertinent labs:   K+ 3.4 Glucose 148 BUN 24/Creatinine 1.50/GFR 34; 9/0.85/>60 in 12/18 Lactate 2.1, 1.6 WBC 9.0 Hgb 15.4 COVID negative UA: 50 glucose, 20 ketones, small LE   Assessment/Plan Principal Problem:   Intractable vomiting with nausea Active Problems:   Hyperlipidemia   Dehydration   Accelerated hypertension   AKI (acute kidney injury) ([place])   Intractable n/v -Patient with episodic intractable n/v -Has had symptoms since late last week -Continued to be quite nauseated during my evaluation -Imaging today unremarkable -Overall, her labs and imaging appear benign at this time other than mild dehydration -Will observe overnight -She has chronic pain syndrome and takes opiates; there may be an aspect of opiate withdrawal and so will treat pain with morphine as needed -She has psychiatric illness with anxiety and so this may also be contributing; continue Cymbalta, Lexapro, Ativan and add prn IV ativan  Dehydration with AKI -Hold HCTZ, Cozaar -IVF repletion -Recheck BMP in AM  Accelerated HTN -Poor  baseline control -Continue Lopressor -Intractable vomiting is likely exacerbating -Will add prn IV hydralazine  HLD -Continue Crestor   Note: This patient has been tested and is negative for the novel coronavirus COVID-19.  DVT prophylaxis:  Lovenox  Code Status: DNR - confirmed with patient Family Communication: None present Disposition Plan:  Home once clinically improved Consults called: None  Admission status: It is my clinical opinion that referral for OBSERVATION is reasonable and necessary in this patient based on the above information provided. The aforementioned taken together are felt to place the patient at high risk for further clinical deterioration. However it is anticipated that the patient may be medically stable for discharge from the hospital within 24 to 48 hours.    Karmen Bongo MD [facility]   How to contact the [facility] Attending or Consulting provider [place] or covering provider during after hours [place], for this patient?  1. Check the care team in [facility] and look for a) attending/consulting [facility] provider listed and b) the [facility] team listed 2. Log into [email or web address] and use [place]'s universal password to access. If you do not have the password, please contact the hospital operator. 3. Locate the [facility] provider you are looking for under [facility] and page to a number that you can be directly reached. 4. If you still have difficulty reaching the provider, please page the [facility] (Director on Call) for the Hospitalists listed on amion for assistance.   11/30/2018, 7:12 PM

## 2018-11-30 NOTE — ED Provider Notes (Signed)
Care assumed from [facility], PA-C, at shift change, please see their notes for full documentation of patient's complaint/HPI. Briefly, pt here with nausea and nonbloody nonbilious emesis x 4 days. Results so far show CBC without leukocytosis. Awaiting remainder of labs including CMP, lipase, U/A, and covid swab. Plan is to CT scan A/P given pt has tenderness diffusely. Pt will likely need to be admitted for electrolyte derangements vs dehydration.    Physical Exam  BP (!) 210/111 (BP Location: Right Arm)   Pulse 95   Temp 99.1 F (37.3 C) (Oral)   SpO2 100%   Physical Exam Vitals signs and nursing note reviewed.  Constitutional:      Appearance: She is not ill-appearing.  HENT:     Head: Normocephalic and atraumatic.  Eyes:     Conjunctiva/sclera: Conjunctivae normal.  Cardiovascular:     Rate and Rhythm: Regular rhythm. Tachycardia present.  Pulmonary:     Effort: Pulmonary effort is normal.     Breath sounds: Normal breath sounds. No wheezing, rhonchi or rales.  Abdominal:     Tenderness: There is abdominal tenderness.  Skin:    General: Skin is warm and dry.     Coloration: Skin is not jaundiced.  Neurological:     Mental Status: She is alert.     ED Course/Procedures   Clinical Course as of Nov 29 1037  Tue Nov 30, 2018  0648 Hx of same.  Reports inability to take her HTN meds at home due to persistent vomiting  BP(!): 210/111 [HM]  0714 AKI - elevated from baseline  Creatinine(!): 1.50 [HM]  0714 Mild hypokalemia  Potassium(!): 3.4 [HM]  0715 hemoconcentrated  Hemoglobin(!): 15.4 [HM]  0715 oral  Temp: 99.1 F (37.3 C) [HM]  0715 WNL  Lipase: 43 [HM]  0820 No acute abnormalities  CT ABDOMEN PELVIS W CONTRAST [MV]  0855 Rectal temp 100 F; lactic acid obtained  Temp: 100 F (37.8 C) [MV]  1014 Elevated lactic acid; will repeat; already received 2 L fluid bolus; will initiate another bolus  Lactic Acid, Venous(!!): 2.1 [MV]  1028 Discussed case with  hospitalist Dr. Lorin Mercy who will admit patient for rehydration, blood pressure management   [MV]    Clinical Course User Index [HM] Muthersbaugh, Jarrett Soho, PA-C [MV] Eustaquio Maize, PA-C    Procedures  MDM  Zofran given for nausea; pt has allergies to both Phenergan and Reglan. If no improvement will proceed to possible Ativan or Haldol; pt does not have prolonged QTc on EKG. Pt's blood pressure elevated at 210/111; IV hydralazine ordered prior to shift hand off. Will reevaluate pt once labs return prior to imaging.   Upon reeval of patient; her heart rate is in the 110's. She reports she is still vomiting with zofran; have ordered Ativan for her. Will give another bag of fluids as well. Rectal temp 100 F; lactic acid ordered at this time. Pt without WBC count; still awaiting U/A but likely patient has elevated temp from dehydration.   Blood pressure still elevated; nursing staff informed that pt received 20 mg of Hydralazine instead of 10 mg. No significant change in blood pressure. Given she is tachycardic as well and typically takes metoprolol at home will order labetalol in the ED today. Pt to be admitted by hospitalist service for rehydration and blood pressure management.      Eustaquio Maize, PA-C 11/30/18 1438    Jola Schmidt, MD 11/30/18 1623

## 2018-11-30 NOTE — ED Triage Notes (Signed)
Patient with nausea and vomiting for the last 4 days.  Patient states she had diarrhea start yesterday.  Patient last vomited about one hour ago.  Patient is CAOx4.

## 2018-11-30 NOTE — ED Notes (Signed)
No current emesis noted,  Pt resting comfortably at this time.

## 2018-11-30 NOTE — ED Notes (Signed)
Returns from CT scan and continues to have nausea.  Pain a a 3 at this time.

## 2018-11-30 NOTE — ED Provider Notes (Signed)
[name] EMERGENCY DEPARTMENT Provider Note   CSN: [id number] Arrival date & time: 11/30/18  0542    History   Chief Complaint Chief Complaint  Patient presents with  . Nausea  . Emesis    HPI Angela [name] is a 73 y.o. female with a hx of anxiety, colon cancer, fibromyalgia, chronic pain, IBS, CAD, pancreatitis, gastroparesis presents to the Emergency Department complaining of gradual, persistent, progressively worsening NBNB emesis onset 5 days ago.  Pt reports taking home zofran without relief.  She also reports attempting small sips of water, but any oral intake results in emesis.  Pt reports severe epigastric abd pain. She reports a surgical hx of cholecystectomy.  Pt denies known sick contacts, fever, chills, chest pain, SOB, syncope, dysuria.  Pt reports generalized weakness and fatigue since this started.  She does reports loose stools since yesterday.  Pt with adverse drug reactions to Phenergan and Reglan.   Pt reports symptoms are the same as previous episodes of gastroparesis and this often requires admission.  Pt reports moderate-severe epigastric abd pain.        The history is provided by the patient and medical records.    Past Medical History:  Diagnosis Date  . Anxiety   . Cancer of sigmoid ([place])   . Depression (emotion)   . DJD (degenerative joint disease)   . Fibromyalgia   . GERD (gastroesophageal reflux disease)   . Hemorrhoids   . Hyperlipidemia   . Hypertension   . IBS (irritable bowel syndrome)   . MRSA (methicillin resistant Staphylococcus aureus) septicemia ([place])    history of  . Over weight   . Pancreatitis   . RLS (restless legs syndrome)   . Tachycardia     Patient Active Problem List   Diagnosis Date Noted  . Exertional dyspnea 08/19/2017  . Accelerated hypertension 05/31/2017  . Elevated lactic acid level 05/31/2017  . Headache 05/31/2017  . Constipation 05/31/2017  . Hypokalemia 05/31/2017  . Nausea and  vomiting 05/30/2017  . Nausea & vomiting 05/29/2017  . Fibromyalgia 05/29/2017  . Dehydration 05/16/2016  . Muscle ache of extremity 02/20/2016  . CAD (coronary artery disease) 01/08/2016  . Abnormal stress test 12/12/2015  . Chest pain 11/27/2015  . Family history of early CAD 11/27/2015  . Hyperlipidemia 11/27/2015    Past Surgical History:  Procedure Laterality Date  . CARDIAC CATHETERIZATION [name] 12/26/2015   Procedure: Left Heart Cath and Coronary Angiography;  Surgeon: Troy Sine, MD;  Location: [facility];  Service: Cardiovascular;  Laterality: [name];  . child birth       OB History   No obstetric history on file.      Home Medications    Prior to Admission medications   Medication Sig Start Date End Date Taking? Authorizing Provider  amLODipine (NORVASC) 2.5 MG tablet Take 2 tablets (5 mg total) by mouth daily. 03/30/18   Dorothy Spark, MD  aspirin EC 81 MG tablet Take 81 mg by mouth daily.    [provider]  Cyanocobalamin (B-12) 2500 MCG TABS Take 2,500 mcg by mouth daily.     [provider]  DULoxetine (CYMBALTA) 20 MG capsule Take 20 mg by mouth 2 (two) times daily.  03/26/17   [provider]  escitalopram (LEXAPRO) 5 MG tablet Take 5 mg by mouth at bedtime.  06/05/16   [provider]  fluticasone (FLONASE) 50 MCG/ACT nasal spray Place 2 sprays into both nostrils daily  as needed (seasonal allergies).     [provider]  hydrochlorothiazide (HYDRODIURIL) 25 MG tablet Take 12.5 mg by mouth daily. 10/15/17   [provider]  HYDROcodone-acetaminophen (NORCO) 10-325 MG tablet Take 1 tablet by mouth 4 (four) times daily as needed (pain).  05/19/17   [provider]  isosorbide mononitrate (IMDUR) 30 MG 24 hr tablet Take 1 tablet (30 mg total) by mouth daily. 07/08/16 03/30/18  [name] M, PA-C  LORazepam (ATIVAN) 2 MG tablet Take 2 mg by mouth at bedtime.     [provider]   losartan (COZAAR) 100 MG tablet Take 100 mg by mouth daily.    [provider]  metoprolol tartrate (LOPRESSOR) 50 MG tablet TAKE ONE AND ONE-HALF TABLETS TWICE A DAY 04/02/18   [name] M, PA-C  Milnacipran HCl (SAVELLA) 100 MG TABS tablet Take 100 mg by mouth 2 (two) times daily.    [provider]  MOVANTIK 25 MG TABS tablet Take 1 tablet by mouth daily. 07/27/17   [provider]  nitroGLYCERIN (NITROSTAT) 0.4 MG SL tablet Place 1 tablet (0.4 mg total) under the tongue every 5 (five) minutes as needed for chest pain. Patient not taking: Reported on 03/30/2018 01/08/16 10/28/17  Imogene Burn, PA-C  ondansetron (ZOFRAN-ODT) 4 MG disintegrating tablet Take 1 tablet (4 mg total) by mouth every 6 (six) hours as needed for nausea or vomiting. 06/02/17   Mariel Aloe, MD  pantoprazole (PROTONIX) 40 MG tablet Take 40 mg by mouth daily.  06/04/16   [provider]  rOPINIRole (REQUIP) 1 MG tablet Take 1-2 mg by mouth See admin instructions. Take 2 tablets (2 mg) by mouth daily at bedtime, may also take 1 tablet (1 mg) in the afternoon as needed for restless legs    [provider]  rosuvastatin (CRESTOR) 10 MG tablet Take 1 tablet (10 mg total) by mouth daily. 01/22/18   Dorothy Spark, MD  tiZANidine (ZANAFLEX) 4 MG tablet Take 4 mg by mouth every 8 (eight) hours as needed for muscle spasms.  04/21/17   [provider]    Family History Family History  Problem Relation Age of Onset  . Hypertension Mother   . Heart disease Mother   . Heart attack Father   . Stroke Father   . Diabetes Sister   . Hypertension Sister   . Stroke Other   . Diabetes Other   . Heart disease Other     Social History Social History   Tobacco Use  . Smoking status: Never Smoker  . Smokeless tobacco: Never Used  Substance Use Topics  . Alcohol use: No    Alcohol/week: 0.0 standard drinks  . Drug use: No     Allergies   Norvasc [amlodipine  besylate], Phenergan [promethazine], Timentin [ticarcillin-pot clavulanate], Pregabalin, and Reglan [metoclopramide]   Review of Systems Review of Systems  Constitutional: Negative for appetite change, diaphoresis, fatigue, fever and unexpected weight change.  HENT: Negative for mouth sores.   Eyes: Negative for visual disturbance.  Respiratory: Negative for cough, chest tightness, shortness of breath and wheezing.   Cardiovascular: Negative for chest pain.  Gastrointestinal: Positive for abdominal pain, diarrhea, nausea and vomiting. Negative for constipation.  Endocrine: Negative for polydipsia, polyphagia and polyuria.  Genitourinary: Negative for dysuria, frequency, hematuria and urgency.  Musculoskeletal: Negative for back pain and neck stiffness.  Skin: Negative for rash.  Allergic/Immunologic: Negative for immunocompromised state.  Neurological: Negative for syncope, light-headedness and headaches.  Hematological: Does not bruise/bleed easily.  Psychiatric/Behavioral: Negative for sleep disturbance. The patient is not nervous/anxious.      Physical Exam Updated Vital Signs BP (!) 210/111 (BP Location: Right Arm)   Pulse 95   Temp 99.1 F (37.3 C) (Oral)   SpO2 100%   Physical Exam Vitals signs and nursing note reviewed.  Constitutional:      General: She is not in acute distress.    Appearance: She is not diaphoretic.  HENT:     Head: Normocephalic.     Mouth/Throat:     Mouth: Mucous membranes are dry.     Comments: Lips and mucous membranes are dry Eyes:     General: No scleral icterus.    Conjunctiva/sclera: Conjunctivae normal.  Neck:     Musculoskeletal: Normal range of motion.  Cardiovascular:     Rate and Rhythm: Normal rate and regular rhythm.     Pulses: Normal pulses.          Radial pulses are 2+ on the right side and 2+ on the left side.  Pulmonary:     Effort: No tachypnea, accessory muscle usage, prolonged expiration, respiratory distress or  retractions.     Breath sounds: No stridor.     Comments: Equal chest rise. No increased work of breathing. Abdominal:     General: There is no distension.     Palpations: Abdomen is soft.     Tenderness: There is abdominal tenderness in the epigastric area. There is no right CVA tenderness, left CVA tenderness, guarding or rebound.  Musculoskeletal:     Comments: Moves all extremities equally and without difficulty.  Skin:    General: Skin is warm and dry.     Capillary Refill: Capillary refill takes less than 2 seconds.  Neurological:     Mental Status: She is alert.     GCS: GCS eye subscore is 4. GCS verbal subscore is 5. GCS motor subscore is 6.     Comments: Speech is clear and goal oriented.  Psychiatric:        Mood and Affect: Mood normal.      ED Treatments / Results  Labs (all labs ordered are listed, but only abnormal results are displayed) Labs Reviewed  COMPREHENSIVE METABOLIC PANEL - Abnormal; Notable for the following components:      Result Value   Potassium 3.4 (*)    Glucose, Bld 148 (*)    BUN 24 (*)    Creatinine, Ser 1.50 (*)    GFR calc non Af Amer 34 (*)    GFR calc Af Amer 40 (*)    All other components within normal limits  CBC WITH DIFFERENTIAL/PLATELET - Abnormal; Notable for the following components:   RBC 5.15 (*)    Hemoglobin 15.4 (*)    All other components within normal limits  SARS CORONAVIRUS 2 (HOSPITAL ORDER, [place] [facility])  LIPASE, BLOOD  URINALYSIS, ROUTINE W REFLEX MICROSCOPIC    EKG EKG Interpretation  Date/Time:  Tuesday November 30 2018 06:04:33 EDT Ventricular Rate:  72 PR Interval:    QRS Duration: 89 QT Interval:  399 QTC Calculation: 437 R Axis:   -47 Text Interpretation:  Sinus rhythm Atrial premature complex Right atrial enlargement Left anterior fascicular block Consider right ventricular hypertrophy No significant change since last tracing Confirmed by Orpah Greek [phone]) on  11/30/2018 6:09:25 AM   Radiology No results found.  Procedures Procedures (including critical care time)  Medications Ordered in  ED Medications  morphine 4 MG/ML injection 4 mg (has no administration in time range)  hydrALAZINE (APRESOLINE) injection 10 mg (has no administration in time range)  sodium chloride flush (NS) 0.9 % injection 3 mL (3 mLs Intravenous Given 11/30/18 0620)  ondansetron (ZOFRAN) injection 4 mg (4 mg Intravenous Given 11/30/18 0631)  sodium chloride 0.9 % bolus 1,000 mL (1,000 mLs Intravenous New Bag/Given 11/30/18 0631)     Initial Impression / Assessment and Plan / ED Course  I have reviewed the triage vital signs and the nursing notes.  Pertinent labs & imaging results that were available during my care of the patient were reviewed by me and considered in my medical decision making (see chart for details).  Clinical Course as of Nov 30 715  Tue Nov 30, 2018  0648 Hx of same.  Reports inability to take her HTN meds at home due to persistent vomiting  BP(!): 210/111 [HM]  0714 AKI - elevated from baseline  Creatinine(!): 1.50 [HM]  0714 Mild hypokalemia  Potassium(!): 3.4 [HM]  0715 hemoconcentrated  Hemoglobin(!): 15.4 [HM]  0715 oral  Temp: 99.1 F (37.3 C) [HM]  0715 WNL  Lipase: 43 [HM]    Clinical Course User Index [HM] Ryer Asato, Jarrett Soho, PA-C        Pt presents with abd pain, nausea and vomiting.  She reports this is the same as previous episodes of gastroparesis and reports she has previously been admitted for this.  She is actively vomiting in the room. Pt given IV zofran and fluids.   Pt is hypertensive, but has not had any of her medications in several days.  Hydralazine given.   Elevated creatinine from baseline.  Pt receiving fluids.    7:16 AM At shift change care was transferred to [facility], PA-C who will follow pending studies, re-evaulate and determine disposition.     Final Clinical Impressions(s) / ED Diagnoses    Final diagnoses:  Intractable vomiting with nausea, unspecified vomiting type  Hypokalemia  AKI (acute kidney injury) [facility])  Essential hypertension    ED Discharge Orders    None       Agapito Games 11/30/18 6803    Orpah Greek, MD 11/30/18 [phone]

## 2018-12-01 DIAGNOSIS — Z66 Do not resuscitate: Secondary | ICD-10-CM | POA: Diagnosis present

## 2018-12-01 DIAGNOSIS — G894 Chronic pain syndrome: Secondary | ICD-10-CM | POA: Diagnosis present

## 2018-12-01 DIAGNOSIS — Z8614 Personal history of Methicillin resistant Staphylococcus aureus infection: Secondary | ICD-10-CM | POA: Diagnosis not present

## 2018-12-01 DIAGNOSIS — G2581 Restless legs syndrome: Secondary | ICD-10-CM | POA: Diagnosis present

## 2018-12-01 DIAGNOSIS — F329 Major depressive disorder, single episode, unspecified: Secondary | ICD-10-CM | POA: Diagnosis present

## 2018-12-01 DIAGNOSIS — R112 Nausea with vomiting, unspecified: Secondary | ICD-10-CM | POA: Diagnosis not present

## 2018-12-01 DIAGNOSIS — E860 Dehydration: Secondary | ICD-10-CM | POA: Diagnosis present

## 2018-12-01 DIAGNOSIS — E785 Hyperlipidemia, unspecified: Secondary | ICD-10-CM | POA: Diagnosis present

## 2018-12-01 DIAGNOSIS — I10 Essential (primary) hypertension: Secondary | ICD-10-CM

## 2018-12-01 DIAGNOSIS — Z7982 Long term (current) use of aspirin: Secondary | ICD-10-CM | POA: Diagnosis not present

## 2018-12-01 DIAGNOSIS — Z823 Family history of stroke: Secondary | ICD-10-CM | POA: Diagnosis not present

## 2018-12-01 DIAGNOSIS — N179 Acute kidney failure, unspecified: Principal | ICD-10-CM

## 2018-12-01 DIAGNOSIS — Z85038 Personal history of other malignant neoplasm of large intestine: Secondary | ICD-10-CM | POA: Diagnosis not present

## 2018-12-01 DIAGNOSIS — Z79891 Long term (current) use of opiate analgesic: Secondary | ICD-10-CM | POA: Diagnosis not present

## 2018-12-01 DIAGNOSIS — Z79899 Other long term (current) drug therapy: Secondary | ICD-10-CM | POA: Diagnosis not present

## 2018-12-01 DIAGNOSIS — M797 Fibromyalgia: Secondary | ICD-10-CM | POA: Diagnosis present

## 2018-12-01 DIAGNOSIS — I444 Left anterior fascicular block: Secondary | ICD-10-CM | POA: Diagnosis present

## 2018-12-01 DIAGNOSIS — Z1159 Encounter for screening for other viral diseases: Secondary | ICD-10-CM | POA: Diagnosis not present

## 2018-12-01 DIAGNOSIS — Z833 Family history of diabetes mellitus: Secondary | ICD-10-CM | POA: Diagnosis not present

## 2018-12-01 DIAGNOSIS — K219 Gastro-esophageal reflux disease without esophagitis: Secondary | ICD-10-CM | POA: Diagnosis present

## 2018-12-01 DIAGNOSIS — F419 Anxiety disorder, unspecified: Secondary | ICD-10-CM | POA: Diagnosis present

## 2018-12-01 DIAGNOSIS — Z8249 Family history of ischemic heart disease and other diseases of the circulatory system: Secondary | ICD-10-CM | POA: Diagnosis not present

## 2018-12-01 DIAGNOSIS — Z888 Allergy status to other drugs, medicaments and biological substances status: Secondary | ICD-10-CM | POA: Diagnosis not present

## 2018-12-01 DIAGNOSIS — E876 Hypokalemia: Secondary | ICD-10-CM | POA: Diagnosis present

## 2018-12-01 LAB — CBC
HCT: 40.7 % (ref 36.0–46.0)
Hemoglobin: 13.7 g/dL (ref 12.0–15.0)
MCH: 29.8 pg (ref 26.0–34.0)
MCHC: 33.7 g/dL (ref 30.0–36.0)
MCV: 88.5 fL (ref 80.0–100.0)
Platelets: 292 10*3/uL (ref 150–400)
RBC: 4.6 MIL/uL (ref 3.87–5.11)
RDW: 13.1 % (ref 11.5–15.5)
WBC: 9.9 10*3/uL (ref 4.0–10.5)
nRBC: 0 % (ref 0.0–0.2)

## 2018-12-01 LAB — BASIC METABOLIC PANEL
Anion gap: 12 (ref 5–15)
BUN: 14 mg/dL (ref 8–23)
CO2: 25 mmol/L (ref 22–32)
Calcium: 8.9 mg/dL (ref 8.9–10.3)
Chloride: 102 mmol/L (ref 98–111)
Creatinine, Ser: 1.2 mg/dL — ABNORMAL HIGH (ref 0.44–1.00)
GFR calc Af Amer: 52 mL/min — ABNORMAL LOW (ref >60)
GFR calc non Af Amer: 45 mL/min — ABNORMAL LOW (ref >60)
Glucose, Bld: 113 mg/dL — ABNORMAL HIGH (ref 70–99)
Potassium: 3.4 mmol/L — ABNORMAL LOW (ref 3.5–5.1)
Sodium: 139 mmol/L (ref 135–145)

## 2018-12-01 LAB — MAGNESIUM: Magnesium: 1.9 mg/dL (ref 1.7–2.4)

## 2018-12-01 MED ORDER — POTASSIUM CHLORIDE 10 MEQ/100ML IV SOLN
10.0000 meq | INTRAVENOUS | Status: AC
  Administered 2018-12-01 (×2): 10 meq via INTRAVENOUS
  Filled 2018-12-01 (×2): qty 100

## 2018-12-01 NOTE — Plan of Care (Signed)
  Problem: Pain Managment: Goal: General experience of comfort will improve Outcome: Progressing   Problem: Education: Goal: Knowledge of General Education information will improve Description: Including pain rating scale, medication(s)/side effects and non-pharmacologic comfort measures Outcome: Progressing   Problem: Clinical Measurements: Goal: Ability to maintain clinical measurements within normal limits will improve Outcome: Progressing   Problem: Safety: Goal: Ability to remain free from injury will improve Outcome: Progressing   Problem: Activity: Goal: Risk for activity intolerance will decrease Outcome: Progressing   Problem: Nutrition: Goal: Adequate nutrition will be maintained Outcome: Progressing

## 2018-12-01 NOTE — Progress Notes (Signed)
Progress Note    Angela [name]  NTI:[id number] DOB: [birth year]  DOA: 11/30/2018 PCP: [name], MD    Brief Narrative:     Medical records reviewed and are as summarized below:  Angela [name] is an 73 y.o. female with medical history significant of RLS; MRSA septicemia; HTN; HLD; and sigmoid CA presenting with n/v.  She reports having periodic spells like this once or twice a year where she develops n/v. It progresses to bilious emesis and then eventually improves.  She has been taking Zofran at home without improvement since symptoms started Thursday evening.   Assessment/Plan:   Principal Problem:   Intractable vomiting with nausea Active Problems:   Hyperlipidemia   Dehydration   Accelerated hypertension   AKI (acute kidney injury) ([place])  Intractable n/v -Patient with episodic intractable n/v -Has had symptoms since late last week -Continues to be nauseated -has a reglan intolerance -Imaging unremarkable -She has chronic pain syndrome and takes opiates; there may be an aspect of opiate withdrawal and so will treat pain with morphine as needed -She has psychiatric illness with anxiety and so this may also be contributing; continue Cymbalta, Lexapro, Ativan and add prn IV ativan  Dehydration with AKI -Hold HCTZ, Cozaar -IVF repletion -follow Cr  Accelerated HTN -Continue Lopressor -Intractable vomiting is likely exacerbating -Will add prn IV hydralazine  HLD -Continue Crestor    Family Communication/Anticipated D/C date and plan/Code Status   DVT prophylaxis: Lovenox ordered. Code Status: dnr Family Communication:  Disposition Plan: home once able to tolerate PO-- will change to inpatient as she continues to need IVF as not eating more than sips   Medical Consultants:    None.     Subjective:   Still with nausea  Objective:    Vitals:   11/30/18 2227 12/01/18 0100 12/01/18 0533 12/01/18 0847  BP: (!) 167/91 134/79 (!) 145/86 140/90   Pulse: (!) 119 75 80 82  Resp: 20 18 18 18   Temp: 100.3 F (37.9 C) 98.8 F (37.1 C) 98.3 F (36.8 C) 98.7 F (37.1 C)  TempSrc: Oral Oral Oral Oral  SpO2: 98% 95% 97% 98%  Weight:      Height:        Intake/Output Summary (Last 24 hours) at 12/01/2018 0916 Last data filed at 12/01/2018 0100 Gross per 24 hour  Intake 796.94 ml  Output 1 ml  Net 795.94 ml   Filed Weights   11/30/18 1433  Weight: 79.8 kg    Exam: In bed, ill appearing rrr No wheezing Diminished bowel sounds No LE edema  Data Reviewed:   I have personally reviewed following labs and imaging studies:  Labs: Labs show the following:   Basic Metabolic Panel: Recent Labs  Lab 11/30/18 0618 12/01/18 0742  NA 137 139  K 3.4* 3.4*  CL 98 102  CO2 25 25  GLUCOSE 148* 113*  BUN 24* 14  CREATININE 1.50* 1.20*  CALCIUM 9.7 8.9   GFR Estimated Creatinine Clearance: 45.2 mL/min (A) (by C-G formula based on SCr of 1.2 mg/dL (H)). Liver Function Tests: Recent Labs  Lab 11/30/18 0618  AST 24  ALT 23  ALKPHOS 99  BILITOT 0.8  PROT 7.7  ALBUMIN 4.6   Recent Labs  Lab 11/30/18 0618  LIPASE 43   No results for input(s): AMMONIA in the last 168 hours. Coagulation profile No results for input(s): INR, PROTIME in the last 168 hours.  CBC: Recent Labs  Lab 11/30/18 [phone] 12/01/18  0742  WBC 9.0 9.9  NEUTROABS 6.6  --   HGB 15.4* 13.7  HCT 45.6 40.7  MCV 88.5 88.5  PLT 319 292   Cardiac Enzymes: No results for input(s): CKTOTAL, CKMB, CKMBINDEX, TROPONINI in the last 168 hours. BNP (last 3 results) No results for input(s): PROBNP in the last 8760 hours. CBG: No results for input(s): GLUCAP in the last 168 hours. D-Dimer: No results for input(s): DDIMER in the last 72 hours. Hgb A1c: No results for input(s): HGBA1C in the last 72 hours. Lipid Profile: No results for input(s): CHOL, HDL, LDLCALC, TRIG, CHOLHDL, LDLDIRECT in the last 72 hours. Thyroid function studies: No results for  input(s): TSH, T4TOTAL, T3FREE, THYROIDAB in the last 72 hours.  Invalid input(s): FREET3 Anemia work up: No results for input(s): VITAMINB12, FOLATE, FERRITIN, TIBC, IRON, RETICCTPCT in the last 72 hours. Sepsis Labs: Recent Labs  Lab 11/30/18 0623 11/30/18 0856 11/30/18 1140 12/01/18 0742  WBC 9.0  --   --  9.9  LATICACIDVEN  --  2.1* 1.6  --     Microbiology Recent Results (from the past 240 hour(s))  SARS Coronavirus 2 (CEPHEID - Performed in [place] hospital lab), Hosp Order     Status: None   Collection Time: 11/30/18  6:22 AM   Specimen: Nasopharyngeal Swab  Result Value Ref Range Status   SARS Coronavirus 2 NEGATIVE NEGATIVE Final    Comment: (NOTE) If result is NEGATIVE SARS-CoV-2 target nucleic acids are NOT DETECTED. The SARS-CoV-2 RNA is generally detectable in upper and lower  respiratory specimens during the acute phase of infection. The lowest  concentration of SARS-CoV-2 viral copies this assay can detect is 250  copies / mL. A negative result does not preclude SARS-CoV-2 infection  and should not be used as the sole basis for treatment or other  patient management decisions.  A negative result may occur with  improper specimen collection / handling, submission of specimen other  than nasopharyngeal swab, presence of viral mutation(s) within the  areas targeted by this assay, and inadequate number of viral copies  (<250 copies / mL). A negative result must be combined with clinical  observations, patient history, and epidemiological information. If result is POSITIVE SARS-CoV-2 target nucleic acids are DETECTED. The SARS-CoV-2 RNA is generally detectable in upper and lower  respiratory specimens dur ing the acute phase of infection.  Positive  results are indicative of active infection with SARS-CoV-2.  Clinical  correlation with patient history and other diagnostic information is  necessary to determine patient infection status.  Positive results do   not rule out bacterial infection or co-infection with other viruses. If result is PRESUMPTIVE POSTIVE SARS-CoV-2 nucleic acids MAY BE PRESENT.   A presumptive positive result was obtained on the submitted specimen  and confirmed on repeat testing.  While 2019 novel coronavirus  (SARS-CoV-2) nucleic acids may be present in the submitted sample  additional confirmatory testing may be necessary for epidemiological  and / or clinical management purposes  to differentiate between  SARS-CoV-2 and other Sarbecovirus currently known to infect humans.  If clinically indicated additional testing with an alternate test  methodology [phone]) is advised. The SARS-CoV-2 RNA is generally  detectable in upper and lower respiratory sp ecimens during the acute  phase of infection. The expected result is Negative. Fact Sheet for Patients:  StrictlyIdeas.no Fact Sheet for Healthcare Providers: BankingDealers.co.za This test is not yet approved or cleared by the [place] FDA and has been authorized for  detection and/or diagnosis of SARS-CoV-2 by FDA under an Emergency Use Authorization (EUA).  This EUA will remain in effect (meaning this test can be used) for the duration of the COVID-19 declaration under Section 564(b)(1) of the Act, 21 U.S.C. section 360bbb-3(b)(1), unless the authorization is terminated or revoked sooner. Performed at [facility], [place] [address] [place], [place] [address]     Procedures and diagnostic studies:  Ct Abdomen Pelvis W Contrast  Result Date: 11/30/2018 CLINICAL DATA:  Acute generalized abdominal pain, nausea and vomiting since Thursday night, history hypertension, irritable bowel syndrome, colon cancer EXAM: CT ABDOMEN AND PELVIS WITH CONTRAST TECHNIQUE: Multidetector CT imaging of the abdomen and pelvis was performed using the standard protocol following bolus administration of intravenous contrast. Sagittal  and coronal MPR images reconstructed from axial data set. CONTRAST:  80 cc Isovue-300 IV COMPARISON:  05/29/2017, 05/16/2016 FINDINGS: Lower chest: Minimal subsegmental atelectasis at RIGHT lower lobe. Nodular density LEFT major fissure 4 mm diameter image 7 unchanged. Hepatobiliary: Gallbladder surgically absent. Focal fatty infiltration of liver adjacent to falciform fissure. Liver otherwise unremarkable. Pancreas: Normal appearance Spleen: Normal appearance Adrenals/Urinary Tract: Adrenal glands normal appearance. Small BILATERAL renal cysts without solid renal mass or hydronephrosis. No urinary tract calcification or dilatation. Ureters and bladder unremarkable Stomach/Bowel: Small hiatal hernia. Anastomotic staple line at descending sigmoid junction. Appendix not visualized but no pericecal inflammatory process seen. Stomach and bowel loops otherwise normal appearance Vascular/Lymphatic: Atherosclerotic calcifications aorta and iliac arteries without aneurysm. No adenopathy. Reproductive: Uterus surgically absent with nonvisualization of LEFT ovary. RIGHT ovary normal appearance Other: No free air or free fluid. Small periumbilical hernia containing fat. No acute inflammatory process identified. Musculoskeletal: Unremarkable IMPRESSION: No acute intra-abdominal or intrapelvic abnormalities. Small hiatal hernia. Small periumbilical hernia containing fat. Stable 4 mm diameter nodular density at LEFT major fissure unchanged since 2017. Electronically Signed   By: Lavonia Dana M.D.   On: 11/30/2018 07:55    Medications:   . aspirin EC  81 mg Oral Daily  . DULoxetine  20 mg Oral BID  . enoxaparin (LOVENOX) injection  40 mg Subcutaneous Q24H  . escitalopram  10 mg Oral Daily  . isosorbide mononitrate  30 mg Oral Daily  . LORazepam  2 mg Oral QHS  . metoprolol tartrate  100 mg Oral BID  . Milnacipran  100 mg Oral BID  . pantoprazole  40 mg Oral Daily  . rOPINIRole  2 mg Oral QHS  . rosuvastatin  10 mg  Oral Daily   Continuous Infusions: . sodium chloride 100 mL/hr at 11/30/18 1500     LOS: 0 days   Geradine Girt  [facility]   How to contact the [facility] : [facility] Attending or Consulting provider [place] or covering provider during after hours [place], for this patient?  1. Check the care team in [facility] and look for a) attending/consulting [facility] provider listed and b) the [facility] team listed 2. Log into [email or web address] and use [place]'s universal password to access. If you do not have the password, please contact the hospital operator. 3. Locate the [facility] provider you are looking for under [facility] and page to a number that you can be directly reached. 4. If you still have difficulty reaching the provider, please page the [facility] (Director on Call) for the Hospitalists listed on amion for assistance.  12/01/2018, 9:16 AM

## 2018-12-02 LAB — CBC
HCT: 33.7 % — ABNORMAL LOW (ref 36.0–46.0)
Hemoglobin: 11.1 g/dL — ABNORMAL LOW (ref 12.0–15.0)
MCH: 29.8 pg (ref 26.0–34.0)
MCHC: 32.9 g/dL (ref 30.0–36.0)
MCV: 90.6 fL (ref 80.0–100.0)
Platelets: 203 10*3/uL (ref 150–400)
RBC: 3.72 MIL/uL — ABNORMAL LOW (ref 3.87–5.11)
RDW: 12.8 % (ref 11.5–15.5)
WBC: 6.1 10*3/uL (ref 4.0–10.5)
nRBC: 0 % (ref 0.0–0.2)

## 2018-12-02 LAB — BASIC METABOLIC PANEL
Anion gap: 6 (ref 5–15)
BUN: 11 mg/dL (ref 8–23)
CO2: 26 mmol/L (ref 22–32)
Calcium: 8.3 mg/dL — ABNORMAL LOW (ref 8.9–10.3)
Chloride: 108 mmol/L (ref 98–111)
Creatinine, Ser: 1.2 mg/dL — ABNORMAL HIGH (ref 0.44–1.00)
GFR calc Af Amer: 52 mL/min — ABNORMAL LOW (ref >60)
GFR calc non Af Amer: 45 mL/min — ABNORMAL LOW (ref >60)
Glucose, Bld: 95 mg/dL (ref 70–99)
Potassium: 3.8 mmol/L (ref 3.5–5.1)
Sodium: 140 mmol/L (ref 135–145)

## 2018-12-02 MED ORDER — MORPHINE SULFATE (PF) 2 MG/ML IV SOLN
1.0000 mg | INTRAVENOUS | Status: DC | PRN
  Administered 2018-12-02 – 2018-12-03 (×2): 1 mg via INTRAVENOUS
  Filled 2018-12-02 (×2): qty 1

## 2018-12-02 MED ORDER — METOPROLOL TARTRATE 25 MG PO TABS
25.0000 mg | ORAL_TABLET | Freq: Two times a day (BID) | ORAL | Status: DC
  Administered 2018-12-02 (×2): 25 mg via ORAL
  Filled 2018-12-02 (×2): qty 1

## 2018-12-02 NOTE — Progress Notes (Signed)
Progress Note    Angela [name]  UXL:[id number] DOB: [birth year]  DOA: 11/30/2018 PCP: [name], MD    Brief Narrative:     Medical records reviewed and are as summarized below:  Angela [name] is an 73 y.o. female with medical history significant of RLS; MRSA septicemia; HTN; HLD; and sigmoid CA presenting with n/v.  She reports having periodic spells like this once or twice a year where she develops n/v. It progresses to bilious emesis and then eventually improves.  She has been taking Zofran at home without improvement since symptoms started Thursday evening.   Assessment/Plan:   Principal Problem:   Intractable vomiting with nausea Active Problems:   Hyperlipidemia   Dehydration   Accelerated hypertension   AKI (acute kidney injury) ([place])  Intractable n/v -Patient with episodic intractable n/v- worse at night -Has had symptoms since late last week -has a reglan intolerance -Imaging unremarkable -She has chronic pain syndrome and takes opiates; there may be an aspect of opiate withdrawal and so will treat pain with morphine as needed -She has psychiatric illness with anxiety and so this may also be contributing; continue Cymbalta, Lexapro, Ativan and add prn IV ativan -tolerated clears-- will advance to full liquids this AM and may try soft diet for dinner depending on how patient does  Dehydration with AKI -Hold HCTZ, Cozaar -IVF repletion -Cr stable   HTN -BP on the lower end -holding parameter and reduced doses -monitor closely  HLD -Continue Crestor    Family Communication/Anticipated D/C date and plan/Code Status   DVT prophylaxis: Lovenox ordered. Code Status: dnr Family Communication:  Disposition Plan: home once able to tolerate PO-- will change to inpatient as she continues to need IVF as not eating more than sips-- suspect in the AM   Medical Consultants:    None.     Subjective:   Minimal nausea last PM-- improved with  medications  Objective:    Vitals:   12/01/18 2007 12/01/18 2039 12/01/18 2313 12/02/18 0454  BP: (!) 84/44 (!) 102/55 116/60 (!) 99/55  Pulse: 63  63 65  Resp: 16   17  Temp: 98.3 F (36.8 C)   98.1 F (36.7 C)  TempSrc: Oral   Oral  SpO2: 97%   97%  Weight:      Height:        Intake/Output Summary (Last 24 hours) at 12/02/2018 0844 Last data filed at 12/01/2018 1715 Gross per 24 hour  Intake 360 ml  Output -  Net 360 ml   Filed Weights   11/30/18 1433  Weight: 79.8 kg    Exam: In bed, NAD rrr +BS, soft, NT Min LE edema A+OX3  Data Reviewed:   I have personally reviewed following labs and imaging studies:  Labs: Labs show the following:   Basic Metabolic Panel: Recent Labs  Lab 11/30/18 0618 12/01/18 0742 12/02/18 0517  NA 137 139 140  K 3.4* 3.4* 3.8  CL 98 102 108  CO2 25 25 26   GLUCOSE 148* 113* 95  BUN 24* 14 11  CREATININE 1.50* 1.20* 1.20*  CALCIUM 9.7 8.9 8.3*  MG  --  1.9  --    GFR Estimated Creatinine Clearance: 45.2 mL/min (A) (by C-G formula based on SCr of 1.2 mg/dL (H)). Liver Function Tests: Recent Labs  Lab 11/30/18 0618  AST 24  ALT 23  ALKPHOS 99  BILITOT 0.8  PROT 7.7  ALBUMIN 4.6   Recent Labs  Lab 11/30/18  4268  LIPASE 43   No results for input(s): AMMONIA in the last 168 hours. Coagulation profile No results for input(s): INR, PROTIME in the last 168 hours.  CBC: Recent Labs  Lab 11/30/18 0623 12/01/18 0742 12/02/18 0517  WBC 9.0 9.9 6.1  NEUTROABS 6.6  --   --   HGB 15.4* 13.7 11.1*  HCT 45.6 40.7 33.7*  MCV 88.5 88.5 90.6  PLT 319 292 203   Cardiac Enzymes: No results for input(s): CKTOTAL, CKMB, CKMBINDEX, TROPONINI in the last 168 hours. BNP (last 3 results) No results for input(s): PROBNP in the last 8760 hours. CBG: No results for input(s): GLUCAP in the last 168 hours. D-Dimer: No results for input(s): DDIMER in the last 72 hours. Hgb A1c: No results for input(s): HGBA1C in the last 72  hours. Lipid Profile: No results for input(s): CHOL, HDL, LDLCALC, TRIG, CHOLHDL, LDLDIRECT in the last 72 hours. Thyroid function studies: No results for input(s): TSH, T4TOTAL, T3FREE, THYROIDAB in the last 72 hours.  Invalid input(s): FREET3 Anemia work up: No results for input(s): VITAMINB12, FOLATE, FERRITIN, TIBC, IRON, RETICCTPCT in the last 72 hours. Sepsis Labs: Recent Labs  Lab 11/30/18 0623 11/30/18 0856 11/30/18 1140 12/01/18 0742 12/02/18 0517  WBC 9.0  --   --  9.9 6.1  LATICACIDVEN  --  2.1* 1.6  --   --     Microbiology Recent Results (from the past 240 hour(s))  SARS Coronavirus 2 (CEPHEID - Performed in [place] hospital lab), Hosp Order     Status: None   Collection Time: 11/30/18  6:22 AM   Specimen: Nasopharyngeal Swab  Result Value Ref Range Status   SARS Coronavirus 2 NEGATIVE NEGATIVE Final    Comment: (NOTE) If result is NEGATIVE SARS-CoV-2 target nucleic acids are NOT DETECTED. The SARS-CoV-2 RNA is generally detectable in upper and lower  respiratory specimens during the acute phase of infection. The lowest  concentration of SARS-CoV-2 viral copies this assay can detect is 250  copies / mL. A negative result does not preclude SARS-CoV-2 infection  and should not be used as the sole basis for treatment or other  patient management decisions.  A negative result may occur with  improper specimen collection / handling, submission of specimen other  than nasopharyngeal swab, presence of viral mutation(s) within the  areas targeted by this assay, and inadequate number of viral copies  (<250 copies / mL). A negative result must be combined with clinical  observations, patient history, and epidemiological information. If result is POSITIVE SARS-CoV-2 target nucleic acids are DETECTED. The SARS-CoV-2 RNA is generally detectable in upper and lower  respiratory specimens dur ing the acute phase of infection.  Positive  results are indicative of active  infection with SARS-CoV-2.  Clinical  correlation with patient history and other diagnostic information is  necessary to determine patient infection status.  Positive results do  not rule out bacterial infection or co-infection with other viruses. If result is PRESUMPTIVE POSTIVE SARS-CoV-2 nucleic acids MAY BE PRESENT.   A presumptive positive result was obtained on the submitted specimen  and confirmed on repeat testing.  While 2019 novel coronavirus  (SARS-CoV-2) nucleic acids may be present in the submitted sample  additional confirmatory testing may be necessary for epidemiological  and / or clinical management purposes  to differentiate between  SARS-CoV-2 and other Sarbecovirus currently known to infect humans.  If clinically indicated additional testing with an alternate test  methodology [phone]) is advised. The SARS-CoV-2  RNA is generally  detectable in upper and lower respiratory sp ecimens during the acute  phase of infection. The expected result is Negative. Fact Sheet for Patients:  StrictlyIdeas.no Fact Sheet for Healthcare Providers: BankingDealers.co.za This test is not yet approved or cleared by the [place] FDA and has been authorized for detection and/or diagnosis of SARS-CoV-2 by FDA under an Emergency Use Authorization (EUA).  This EUA will remain in effect (meaning this test can be used) for the duration of the COVID-19 declaration under Section 564(b)(1) of the Act, 21 U.S.C. section 360bbb-3(b)(1), unless the authorization is terminated or revoked sooner. Performed at [facility], [place] [address], [place], [place] [address]     Procedures and diagnostic studies:  No results found.  Medications:   . aspirin EC  81 mg Oral Daily  . DULoxetine  20 mg Oral BID  . enoxaparin (LOVENOX) injection  40 mg Subcutaneous Q24H  . escitalopram  10 mg Oral Daily  . isosorbide mononitrate  30 mg Oral Daily   . LORazepam  2 mg Oral QHS  . metoprolol tartrate  25 mg Oral BID  . Milnacipran  100 mg Oral BID  . pantoprazole  40 mg Oral Daily  . rOPINIRole  2 mg Oral QHS  . rosuvastatin  10 mg Oral Daily   Continuous Infusions: . sodium chloride 100 mL/hr at 12/02/18 7672     LOS: 1 day   Geradine Girt  [facility]   How to contact the [facility] Attending or Consulting provider [place] or covering provider during after hours [place], for this patient?  1. Check the care team in [facility] and look for a) attending/consulting [facility] provider listed and b) the [facility] team listed 2. Log into [email or web address] and use [place]'s universal password to access. If you do not have the password, please contact the hospital operator. 3. Locate the [facility] provider you are looking for under [facility] and page to a number that you can be directly reached. 4. If you still have difficulty reaching the provider, please page the [facility] (Director on Call) for the Hospitalists listed on amion for assistance.  12/02/2018, 8:44 AM

## 2018-12-03 LAB — GASTROINTESTINAL PANEL BY PCR, STOOL (REPLACES STOOL CULTURE)

## 2018-12-03 MED ORDER — ONDANSETRON 4 MG PO TBDP: 4.0000 mg | ORAL_TABLET | Freq: Four times a day (QID) | ORAL | 1 refills | Status: DC | PRN

## 2018-12-03 MED ORDER — METOPROLOL TARTRATE 50 MG PO TABS
50.0000 mg | ORAL_TABLET | Freq: Two times a day (BID) | ORAL | Status: DC
  Administered 2018-12-03: 50 mg via ORAL
  Filled 2018-12-03: qty 1

## 2018-12-03 NOTE — Plan of Care (Signed)
Pt for discharge going home.

## 2018-12-03 NOTE — Progress Notes (Signed)
Pt for discharge going home, alert and oriented, no complain of pain, tolerates her meal, ambulates, given health teachings, next appointments, due med explained and understood, given all her personal belongings, no s/s of distress.

## 2018-12-03 NOTE — Discharge Summary (Addendum)
Physician Discharge Summary  Angela [name] FXT:[id number] DOB: 08/12/1945 DOA: 11/30/2018  PCP: [name], MD  Admit date: 11/30/2018 Discharge date: 12/03/2018  Admitted From: home Discharge disposition: home   Recommendations for Outpatient Follow-Up:   1. GI pathogen panel pending-- patient improving 2. Titrate BP medications as needed   Discharge Diagnosis:   Principal Problem:   Intractable vomiting with nausea Active Problems:   Hyperlipidemia   Dehydration   Accelerated hypertension   AKI (acute kidney injury) ([place])    Discharge Condition: Improved.  Diet recommendation: Low sodium, heart healthy  Wound care: None.  Code status: Full.   History of Present Illness:   Angela [name] is a 73 y.o. female with medical history significant of RLS; MRSA septicemia; HTN; HLD; and sigmoid CA presenting with n/v.  She reports having periodic spells like this once or twice a year where she develops n/v. It progresses to bilious emesis and then eventually improves.  She has been taking Zofran at home without improvement since symptoms started Thursday evening.  She has had a few loose stools over the last few days but primarily the issue is n/v.  She has not tolerated PO intake.  She has been feeling dizzy.  She was seeing spots but this has improved with IVF in the ER.    Hospital Course by Problem:   Intractable n/v -Patient with episodic intractable n/v- worse at night -Has had symptoms since late last week -has a reglan intolerance -Imagingunremarkable -GI pathogen panel pending-- supportive care -did well with advancement of diet  Dehydration with AKI -Hold HCTZ -IVF repletion -Cr stable   HTN Resume home meds minus HCTZ for now  HLD -Continue Crestor    Medical Consultants:      Discharge Exam:   Vitals:   12/02/18 2043 12/03/18 0448  BP: (!) 146/87 (!) 153/80  Pulse: 76 70  Resp: 16   Temp: 98.6 F (37 C) 98.6 F (37 C)    SpO2: 99%    Vitals:   12/02/18 1020 12/02/18 1344 12/02/18 2043 12/03/18 0448  BP: (!) 145/65 (!) 100/50 (!) 146/87 (!) 153/80  Pulse: 89 68 76 70  Resp:  15 16   Temp:  98.5 F (36.9 C) 98.6 F (37 C) 98.6 F (37 C)  TempSrc:  Oral Oral Oral  SpO2: 97% 96% 99%   Weight:      Height:        General exam: Appears calm and comfortable.   The results of significant diagnostics from this hospitalization (including imaging, microbiology, ancillary and laboratory) are listed below for reference.     Procedures and Diagnostic Studies:   Ct Abdomen Pelvis W Contrast  Result Date: 11/30/2018 CLINICAL DATA:  Acute generalized abdominal pain, nausea and vomiting since Thursday night, history hypertension, irritable bowel syndrome, colon cancer EXAM: CT ABDOMEN AND PELVIS WITH CONTRAST TECHNIQUE: Multidetector CT imaging of the abdomen and pelvis was performed using the standard protocol following bolus administration of intravenous contrast. Sagittal and coronal MPR images reconstructed from axial data set. CONTRAST:  80 cc Isovue-300 IV COMPARISON:  05/29/2017, 05/16/2016 FINDINGS: Lower chest: Minimal subsegmental atelectasis at RIGHT lower lobe. Nodular density LEFT major fissure 4 mm diameter image 7 unchanged. Hepatobiliary: Gallbladder surgically absent. Focal fatty infiltration of liver adjacent to falciform fissure. Liver otherwise unremarkable. Pancreas: Normal appearance Spleen: Normal appearance Adrenals/Urinary Tract: Adrenal glands normal appearance. Small BILATERAL renal cysts without solid renal mass or hydronephrosis. No urinary tract  calcification or dilatation. Ureters and bladder unremarkable Stomach/Bowel: Small hiatal hernia. Anastomotic staple line at descending sigmoid junction. Appendix not visualized but no pericecal inflammatory process seen. Stomach and bowel loops otherwise normal appearance Vascular/Lymphatic: Atherosclerotic calcifications aorta and iliac arteries  without aneurysm. No adenopathy. Reproductive: Uterus surgically absent with nonvisualization of LEFT ovary. RIGHT ovary normal appearance Other: No free air or free fluid. Small periumbilical hernia containing fat. No acute inflammatory process identified. Musculoskeletal: Unremarkable IMPRESSION: No acute intra-abdominal or intrapelvic abnormalities. Small hiatal hernia. Small periumbilical hernia containing fat. Stable 4 mm diameter nodular density at LEFT major fissure unchanged since 2017. Electronically Signed   By: Lavonia Dana M.D.   On: 11/30/2018 07:55     Labs:   Basic Metabolic Panel: Recent Labs  Lab 11/30/18 0618 12/01/18 0742 12/02/18 0517  NA 137 139 140  K 3.4* 3.4* 3.8  CL 98 102 108  CO2 25 25 26   GLUCOSE 148* 113* 95  BUN 24* 14 11  CREATININE 1.50* 1.20* 1.20*  CALCIUM 9.7 8.9 8.3*  MG  --  1.9  --    GFR Estimated Creatinine Clearance: 45.2 mL/min (A) (by C-G formula based on SCr of 1.2 mg/dL (H)). Liver Function Tests: Recent Labs  Lab 11/30/18 0618  AST 24  ALT 23  ALKPHOS 99  BILITOT 0.8  PROT 7.7  ALBUMIN 4.6   Recent Labs  Lab 11/30/18 0618  LIPASE 43   No results for input(s): AMMONIA in the last 168 hours. Coagulation profile No results for input(s): INR, PROTIME in the last 168 hours.  CBC: Recent Labs  Lab 11/30/18 0623 12/01/18 0742 12/02/18 0517  WBC 9.0 9.9 6.1  NEUTROABS 6.6  --   --   HGB 15.4* 13.7 11.1*  HCT 45.6 40.7 33.7*  MCV 88.5 88.5 90.6  PLT 319 292 203   Cardiac Enzymes: No results for input(s): CKTOTAL, CKMB, CKMBINDEX, TROPONINI in the last 168 hours. BNP: Invalid input(s): POCBNP CBG: No results for input(s): GLUCAP in the last 168 hours. D-Dimer No results for input(s): DDIMER in the last 72 hours. Hgb A1c No results for input(s): HGBA1C in the last 72 hours. Lipid Profile No results for input(s): CHOL, HDL, LDLCALC, TRIG, CHOLHDL, LDLDIRECT in the last 72 hours. Thyroid function studies No results  for input(s): TSH, T4TOTAL, T3FREE, THYROIDAB in the last 72 hours.  Invalid input(s): FREET3 Anemia work up No results for input(s): VITAMINB12, FOLATE, FERRITIN, TIBC, IRON, RETICCTPCT in the last 72 hours. Microbiology Recent Results (from the past 240 hour(s))  SARS Coronavirus 2 (CEPHEID - Performed in [place] hospital lab), Hosp Order     Status: None   Collection Time: 11/30/18  6:22 AM   Specimen: Nasopharyngeal Swab  Result Value Ref Range Status   SARS Coronavirus 2 NEGATIVE NEGATIVE Final    Comment: (NOTE) If result is NEGATIVE SARS-CoV-2 target nucleic acids are NOT DETECTED. The SARS-CoV-2 RNA is generally detectable in upper and lower  respiratory specimens during the acute phase of infection. The lowest  concentration of SARS-CoV-2 viral copies this assay can detect is 250  copies / mL. A negative result does not preclude SARS-CoV-2 infection  and should not be used as the sole basis for treatment or other  patient management decisions.  A negative result may occur with  improper specimen collection / handling, submission of specimen other  than nasopharyngeal swab, presence of viral mutation(s) within the  areas targeted by this assay, and inadequate number of viral  copies  (<250 copies / mL). A negative result must be combined with clinical  observations, patient history, and epidemiological information. If result is POSITIVE SARS-CoV-2 target nucleic acids are DETECTED. The SARS-CoV-2 RNA is generally detectable in upper and lower  respiratory specimens dur ing the acute phase of infection.  Positive  results are indicative of active infection with SARS-CoV-2.  Clinical  correlation with patient history and other diagnostic information is  necessary to determine patient infection status.  Positive results do  not rule out bacterial infection or co-infection with other viruses. If result is PRESUMPTIVE POSTIVE SARS-CoV-2 nucleic acids MAY BE PRESENT.   A  presumptive positive result was obtained on the submitted specimen  and confirmed on repeat testing.  While 2019 novel coronavirus  (SARS-CoV-2) nucleic acids may be present in the submitted sample  additional confirmatory testing may be necessary for epidemiological  and / or clinical management purposes  to differentiate between  SARS-CoV-2 and other Sarbecovirus currently known to infect humans.  If clinically indicated additional testing with an alternate test  methodology [phone]) is advised. The SARS-CoV-2 RNA is generally  detectable in upper and lower respiratory sp ecimens during the acute  phase of infection. The expected result is Negative. Fact Sheet for Patients:  StrictlyIdeas.no Fact Sheet for Healthcare Providers: BankingDealers.co.za This test is not yet approved or cleared by the [place] FDA and has been authorized for detection and/or diagnosis of SARS-CoV-2 by FDA under an Emergency Use Authorization (EUA).  This EUA will remain in effect (meaning this test can be used) for the duration of the COVID-19 declaration under Section 564(b)(1) of the Act, 21 U.S.C. section 360bbb-3(b)(1), unless the authorization is terminated or revoked sooner. Performed at [facility], [place] [address], [place], [place] [address]      Discharge Instructions:   Discharge Instructions    Discharge instructions   Complete by: As directed    Small frequent meals   Increase activity slowly   Complete by: As directed      Allergies as of 12/03/2018      Reactions   Norvasc [amlodipine Besylate] Swelling   Phenergan [promethazine] Other (See Comments)   Restlessness   Timentin [ticarcillin-pot Clavulanate] Anaphylaxis   Has patient had a PCN reaction causing immediate rash, facial/tongue/throat swelling, SOB or lightheadedness with hypotension: Yes Has patient had a PCN reaction causing severe rash involving mucus membranes  or skin necrosis: Yes Has patient had a PCN reaction that required hospitalization Yes Has patient had a PCN reaction occurring within the last 10 years: No If all of the above answers are "NO", then may proceed with Cephalosporin use.   Pregabalin Other (See Comments)   Mouth sores   Reglan [metoclopramide] Other (See Comments)   Cannot sit still      Medication List    STOP taking these medications   hydrochlorothiazide 25 MG tablet Commonly known as: HYDRODIURIL     TAKE these medications   aspirin EC 81 MG tablet Take 81 mg by mouth daily.   B-12 2500 MCG Tabs Take 2,500 mcg by mouth daily.   DULoxetine 20 MG capsule Commonly known as: CYMBALTA Take 20 mg by mouth 2 (two) times daily.   escitalopram 10 MG tablet Commonly known as: LEXAPRO Take 10 mg by mouth daily.   fluticasone 50 MCG/ACT nasal spray Commonly known as: FLONASE Place 2 sprays into both nostrils daily as needed (seasonal allergies).   HYDROcodone-acetaminophen 10-325 MG tablet Commonly known as:  NORCO Take 1 tablet by mouth 4 (four) times daily as needed (pain).   isosorbide mononitrate 30 MG 24 hr tablet Commonly known as: IMDUR Take 1 tablet (30 mg total) by mouth daily.   LORazepam 2 MG tablet Commonly known as: ATIVAN Take 2 mg by mouth at bedtime.   losartan 100 MG tablet Commonly known as: COZAAR Take 100 mg by mouth daily.   metoprolol tartrate 50 MG tablet Commonly known as: LOPRESSOR TAKE ONE AND ONE-HALF TABLETS TWICE A DAY What changed: See the new instructions.   Movantik 25 MG Tabs tablet Generic drug: naloxegol oxalate Take 1 tablet by mouth daily.   nitroGLYCERIN 0.4 MG SL tablet Commonly known as: NITROSTAT Place 1 tablet (0.4 mg total) under the tongue every 5 (five) minutes as needed for chest pain.   ondansetron 4 MG disintegrating tablet Commonly known as: ZOFRAN-ODT Take 1 tablet (4 mg total) by mouth every 6 (six) hours as needed for nausea or vomiting.     pantoprazole 40 MG tablet Commonly known as: PROTONIX Take 40 mg by mouth daily.   rOPINIRole 1 MG tablet Commonly known as: REQUIP Take 1-2 mg by mouth See admin instructions. Take 2 tablets (2 mg) by mouth daily at bedtime, may also take 1 tablet (1 mg) in the afternoon as needed for restless legs   rosuvastatin 10 MG tablet Commonly known as: CRESTOR Take 1 tablet (10 mg total) by mouth daily.   Savella 100 MG Tabs tablet Generic drug: Milnacipran HCl Take 100 mg by mouth 2 (two) times daily.   tiZANidine 4 MG tablet Commonly known as: ZANAFLEX Take 4 mg by mouth every 8 (eight) hours as needed for muscle spasms.      Follow-up Information    [name], MD Follow up in 1 week(s).   Specialty: Family Medicine Contact information: [place] [address] [phone]            Time coordinating discharge: 25 min  Signed:  Geradine Girt DO  [facility] 12/03/2018, 1:18 PM

## 2018-12-08 DIAGNOSIS — M5030 Other cervical disc degeneration, unspecified cervical region: Secondary | ICD-10-CM | POA: Diagnosis not present

## 2018-12-08 DIAGNOSIS — M961 Postlaminectomy syndrome, not elsewhere classified: Secondary | ICD-10-CM | POA: Diagnosis not present

## 2018-12-08 DIAGNOSIS — M797 Fibromyalgia: Secondary | ICD-10-CM | POA: Diagnosis not present

## 2018-12-08 DIAGNOSIS — Z09 Encounter for follow-up examination after completed treatment for conditions other than malignant neoplasm: Secondary | ICD-10-CM | POA: Diagnosis not present

## 2018-12-08 DIAGNOSIS — G894 Chronic pain syndrome: Secondary | ICD-10-CM | POA: Diagnosis not present

## 2018-12-08 DIAGNOSIS — I10 Essential (primary) hypertension: Secondary | ICD-10-CM | POA: Diagnosis not present

## 2018-12-08 DIAGNOSIS — R112 Nausea with vomiting, unspecified: Secondary | ICD-10-CM | POA: Diagnosis not present

## 2018-12-09 DIAGNOSIS — G992 Myelopathy in diseases classified elsewhere: Secondary | ICD-10-CM | POA: Diagnosis not present

## 2018-12-09 DIAGNOSIS — G8928 Other chronic postprocedural pain: Secondary | ICD-10-CM | POA: Diagnosis not present

## 2018-12-09 DIAGNOSIS — M7918 Myalgia, other site: Secondary | ICD-10-CM | POA: Diagnosis not present

## 2018-12-09 DIAGNOSIS — M5412 Radiculopathy, cervical region: Secondary | ICD-10-CM | POA: Diagnosis not present

## 2018-12-09 DIAGNOSIS — M4802 Spinal stenosis, cervical region: Secondary | ICD-10-CM | POA: Diagnosis not present

## 2018-12-09 DIAGNOSIS — M960 Pseudarthrosis after fusion or arthrodesis: Secondary | ICD-10-CM | POA: Diagnosis not present

## 2018-12-30 DIAGNOSIS — G894 Chronic pain syndrome: Secondary | ICD-10-CM | POA: Diagnosis not present

## 2018-12-30 DIAGNOSIS — M961 Postlaminectomy syndrome, not elsewhere classified: Secondary | ICD-10-CM | POA: Diagnosis not present

## 2018-12-30 DIAGNOSIS — M47817 Spondylosis without myelopathy or radiculopathy, lumbosacral region: Secondary | ICD-10-CM | POA: Diagnosis not present

## 2018-12-30 DIAGNOSIS — M7918 Myalgia, other site: Secondary | ICD-10-CM | POA: Diagnosis not present

## 2018-12-31 DIAGNOSIS — G43A1 Cyclical vomiting, intractable: Secondary | ICD-10-CM | POA: Diagnosis not present

## 2018-12-31 DIAGNOSIS — K5900 Constipation, unspecified: Secondary | ICD-10-CM | POA: Diagnosis not present

## 2019-01-19 ENCOUNTER — Encounter: Payer: Self-pay | Admitting: *Deleted

## 2019-01-26 DIAGNOSIS — R110 Nausea: Secondary | ICD-10-CM | POA: Diagnosis not present

## 2019-01-26 DIAGNOSIS — K5900 Constipation, unspecified: Secondary | ICD-10-CM | POA: Diagnosis not present

## 2019-01-27 DIAGNOSIS — M5030 Other cervical disc degeneration, unspecified cervical region: Secondary | ICD-10-CM | POA: Diagnosis not present

## 2019-01-27 DIAGNOSIS — M542 Cervicalgia: Secondary | ICD-10-CM | POA: Diagnosis not present

## 2019-01-27 DIAGNOSIS — G894 Chronic pain syndrome: Secondary | ICD-10-CM | POA: Diagnosis not present

## 2019-01-27 DIAGNOSIS — M961 Postlaminectomy syndrome, not elsewhere classified: Secondary | ICD-10-CM | POA: Diagnosis not present

## 2019-02-24 DIAGNOSIS — M542 Cervicalgia: Secondary | ICD-10-CM | POA: Diagnosis not present

## 2019-02-24 DIAGNOSIS — M961 Postlaminectomy syndrome, not elsewhere classified: Secondary | ICD-10-CM | POA: Diagnosis not present

## 2019-02-24 DIAGNOSIS — Z79891 Long term (current) use of opiate analgesic: Secondary | ICD-10-CM | POA: Diagnosis not present

## 2019-02-24 DIAGNOSIS — M5030 Other cervical disc degeneration, unspecified cervical region: Secondary | ICD-10-CM | POA: Diagnosis not present

## 2019-02-24 DIAGNOSIS — G894 Chronic pain syndrome: Secondary | ICD-10-CM | POA: Diagnosis not present

## 2019-02-24 DIAGNOSIS — Z79899 Other long term (current) drug therapy: Secondary | ICD-10-CM | POA: Diagnosis not present

## 2019-03-16 DIAGNOSIS — Z1231 Encounter for screening mammogram for malignant neoplasm of breast: Secondary | ICD-10-CM | POA: Diagnosis not present

## 2019-03-25 DIAGNOSIS — M5030 Other cervical disc degeneration, unspecified cervical region: Secondary | ICD-10-CM | POA: Diagnosis not present

## 2019-03-25 DIAGNOSIS — G894 Chronic pain syndrome: Secondary | ICD-10-CM | POA: Diagnosis not present

## 2019-03-25 DIAGNOSIS — M961 Postlaminectomy syndrome, not elsewhere classified: Secondary | ICD-10-CM | POA: Diagnosis not present

## 2019-03-25 DIAGNOSIS — M797 Fibromyalgia: Secondary | ICD-10-CM | POA: Diagnosis not present

## 2019-04-20 ENCOUNTER — Other Ambulatory Visit: Payer: Self-pay

## 2019-04-20 DIAGNOSIS — Z20822 Contact with and (suspected) exposure to covid-19: Secondary | ICD-10-CM

## 2019-04-20 DIAGNOSIS — Z20828 Contact with and (suspected) exposure to other viral communicable diseases: Secondary | ICD-10-CM | POA: Diagnosis not present

## 2019-04-22 DIAGNOSIS — M797 Fibromyalgia: Secondary | ICD-10-CM | POA: Diagnosis not present

## 2019-04-22 DIAGNOSIS — Z79899 Other long term (current) drug therapy: Secondary | ICD-10-CM | POA: Diagnosis not present

## 2019-04-22 DIAGNOSIS — M5134 Other intervertebral disc degeneration, thoracic region: Secondary | ICD-10-CM | POA: Diagnosis not present

## 2019-04-22 DIAGNOSIS — M5406 Panniculitis affecting regions of neck and back, lumbar region: Secondary | ICD-10-CM | POA: Diagnosis not present

## 2019-04-22 DIAGNOSIS — G894 Chronic pain syndrome: Secondary | ICD-10-CM | POA: Diagnosis not present

## 2019-04-22 DIAGNOSIS — M47814 Spondylosis without myelopathy or radiculopathy, thoracic region: Secondary | ICD-10-CM | POA: Diagnosis not present

## 2019-04-22 DIAGNOSIS — M5030 Other cervical disc degeneration, unspecified cervical region: Secondary | ICD-10-CM | POA: Diagnosis not present

## 2019-04-22 DIAGNOSIS — Z79891 Long term (current) use of opiate analgesic: Secondary | ICD-10-CM | POA: Diagnosis not present

## 2019-04-22 DIAGNOSIS — M546 Pain in thoracic spine: Secondary | ICD-10-CM | POA: Diagnosis not present

## 2019-04-22 LAB — NOVEL CORONAVIRUS, NAA: SARS-CoV-2, NAA: NOT DETECTED

## 2019-05-05 DIAGNOSIS — K121 Other forms of stomatitis: Secondary | ICD-10-CM | POA: Diagnosis not present

## 2019-05-05 DIAGNOSIS — F329 Major depressive disorder, single episode, unspecified: Secondary | ICD-10-CM | POA: Diagnosis not present

## 2019-05-05 DIAGNOSIS — I2510 Atherosclerotic heart disease of native coronary artery without angina pectoris: Secondary | ICD-10-CM | POA: Diagnosis not present

## 2019-05-05 DIAGNOSIS — K219 Gastro-esophageal reflux disease without esophagitis: Secondary | ICD-10-CM | POA: Diagnosis not present

## 2019-05-05 DIAGNOSIS — R110 Nausea: Secondary | ICD-10-CM | POA: Diagnosis not present

## 2019-05-05 DIAGNOSIS — E785 Hyperlipidemia, unspecified: Secondary | ICD-10-CM | POA: Diagnosis not present

## 2019-05-05 DIAGNOSIS — I10 Essential (primary) hypertension: Secondary | ICD-10-CM | POA: Diagnosis not present

## 2019-05-05 DIAGNOSIS — M797 Fibromyalgia: Secondary | ICD-10-CM | POA: Diagnosis not present

## 2019-05-23 DIAGNOSIS — M961 Postlaminectomy syndrome, not elsewhere classified: Secondary | ICD-10-CM | POA: Diagnosis not present

## 2019-05-23 DIAGNOSIS — Z79891 Long term (current) use of opiate analgesic: Secondary | ICD-10-CM | POA: Diagnosis not present

## 2019-05-23 DIAGNOSIS — M5030 Other cervical disc degeneration, unspecified cervical region: Secondary | ICD-10-CM | POA: Diagnosis not present

## 2019-05-23 DIAGNOSIS — Z79899 Other long term (current) drug therapy: Secondary | ICD-10-CM | POA: Diagnosis not present

## 2019-05-23 DIAGNOSIS — G894 Chronic pain syndrome: Secondary | ICD-10-CM | POA: Diagnosis not present

## 2019-05-23 DIAGNOSIS — M546 Pain in thoracic spine: Secondary | ICD-10-CM | POA: Diagnosis not present

## 2019-06-12 ENCOUNTER — Ambulatory Visit: Payer: Medicare Other | Attending: Internal Medicine

## 2019-06-12 DIAGNOSIS — Z23 Encounter for immunization: Secondary | ICD-10-CM | POA: Insufficient documentation

## 2019-06-12 NOTE — Progress Notes (Signed)
   Covid-19 Vaccination Clinic  Name:  Angela [name]    MRN: [id number] DOB: 1945/07/13  06/12/2019  [name] was observed post Covid-19 immunization for 30 minutes based on pre-vaccination screening without incidence. She was provided with Vaccine Information Sheet and instruction to access the V-Safe system.   [name] was instructed to call 911 with any severe reactions post vaccine: Marland Kitchen Difficulty breathing  . Swelling of your face and throat  . A fast heartbeat  . A bad rash all over your body  . Dizziness and weakness    Immunizations Administered    Name Date Dose VIS Date Route   Pfizer COVID-19 Vaccine 06/12/2019 11:25 AM 0.3 mL 05/13/2019 Intramuscular   Manufacturer: [employer]   Lot: [id number]   [place]: [id number]

## 2019-06-21 DIAGNOSIS — M5412 Radiculopathy, cervical region: Secondary | ICD-10-CM | POA: Diagnosis not present

## 2019-06-21 DIAGNOSIS — M546 Pain in thoracic spine: Secondary | ICD-10-CM | POA: Diagnosis not present

## 2019-06-21 DIAGNOSIS — G894 Chronic pain syndrome: Secondary | ICD-10-CM | POA: Diagnosis not present

## 2019-06-21 DIAGNOSIS — M5030 Other cervical disc degeneration, unspecified cervical region: Secondary | ICD-10-CM | POA: Diagnosis not present

## 2019-07-01 ENCOUNTER — Ambulatory Visit: Payer: Medicare Other

## 2019-07-02 ENCOUNTER — Ambulatory Visit: Payer: Medicare Other | Attending: Internal Medicine

## 2019-07-02 DIAGNOSIS — Z23 Encounter for immunization: Secondary | ICD-10-CM

## 2019-07-02 NOTE — Progress Notes (Signed)
   Covid-19 Vaccination Clinic  Name:  Angela [name]    MRN: [id number] DOB: Oct 28, 1945  07/02/2019  [name] was observed post Covid-19 immunization for 30 minutes based on pre-vaccination screening without incidence. She was provided with Vaccine Information Sheet and instruction to access the V-Safe system.   [name] was instructed to call 911 with any severe reactions post vaccine: Marland Kitchen Difficulty breathing  . Swelling of your face and throat  . A fast heartbeat  . A bad rash all over your body  . Dizziness and weakness    Immunizations Administered    Name Date Dose VIS Date Route   Pfizer COVID-19 Vaccine 07/02/2019  1:43 PM 0.3 mL 05/13/2019 Intramuscular   Manufacturer: [place]   Lot: [id number]   [place]: [id number]

## 2019-07-25 DIAGNOSIS — M961 Postlaminectomy syndrome, not elsewhere classified: Secondary | ICD-10-CM | POA: Diagnosis not present

## 2019-07-25 DIAGNOSIS — G894 Chronic pain syndrome: Secondary | ICD-10-CM | POA: Diagnosis not present

## 2019-07-25 DIAGNOSIS — M797 Fibromyalgia: Secondary | ICD-10-CM | POA: Diagnosis not present

## 2019-07-25 DIAGNOSIS — Z79899 Other long term (current) drug therapy: Secondary | ICD-10-CM | POA: Diagnosis not present

## 2019-07-25 DIAGNOSIS — M5412 Radiculopathy, cervical region: Secondary | ICD-10-CM | POA: Diagnosis not present

## 2019-07-25 DIAGNOSIS — Z79891 Long term (current) use of opiate analgesic: Secondary | ICD-10-CM | POA: Diagnosis not present

## 2019-07-25 DIAGNOSIS — M5030 Other cervical disc degeneration, unspecified cervical region: Secondary | ICD-10-CM | POA: Diagnosis not present

## 2019-08-23 DIAGNOSIS — G894 Chronic pain syndrome: Secondary | ICD-10-CM | POA: Diagnosis not present

## 2019-08-23 DIAGNOSIS — M797 Fibromyalgia: Secondary | ICD-10-CM | POA: Diagnosis not present

## 2019-08-23 DIAGNOSIS — M5030 Other cervical disc degeneration, unspecified cervical region: Secondary | ICD-10-CM | POA: Diagnosis not present

## 2019-08-23 DIAGNOSIS — M546 Pain in thoracic spine: Secondary | ICD-10-CM | POA: Diagnosis not present

## 2019-09-21 DIAGNOSIS — M546 Pain in thoracic spine: Secondary | ICD-10-CM | POA: Diagnosis not present

## 2019-09-21 DIAGNOSIS — G894 Chronic pain syndrome: Secondary | ICD-10-CM | POA: Diagnosis not present

## 2019-09-21 DIAGNOSIS — Z79899 Other long term (current) drug therapy: Secondary | ICD-10-CM | POA: Diagnosis not present

## 2019-09-21 DIAGNOSIS — Z79891 Long term (current) use of opiate analgesic: Secondary | ICD-10-CM | POA: Diagnosis not present

## 2019-09-21 DIAGNOSIS — M5030 Other cervical disc degeneration, unspecified cervical region: Secondary | ICD-10-CM | POA: Diagnosis not present

## 2019-09-21 DIAGNOSIS — M797 Fibromyalgia: Secondary | ICD-10-CM | POA: Diagnosis not present

## 2019-09-23 ENCOUNTER — Other Ambulatory Visit: Payer: Self-pay | Admitting: Cardiology

## 2019-09-30 ENCOUNTER — Telehealth: Payer: Self-pay | Admitting: Cardiology

## 2019-09-30 NOTE — Telephone Encounter (Signed)
Attempted to call the pt back and phone continues to ring with no answer.  Let the phone ring for several minutes, with no success of pt picking up.

## 2019-09-30 NOTE — Telephone Encounter (Signed)
Spoke with the pt who is calling in to make an appt with Dr. [name] or an APP, for ongoing issues with feeling fatigue, and experiencing sob on exertion, which is gradually worsening over the last 2-3 weeks. Pt states she is overdue to be seen, and has put her issues on hold for quite sometime, but needs to be seen before heading on several trips this summer.  Pt states over the last several months she has been feeling extremely tired, to the point where she could sleep up to 16 hours a day.  She also complains that she is have sob when exerting.  She has no DOE.  Pt states she has no other cardiac complaints like chest pain, dizziness, swelling, N/V,  pre-syncopal or syncopal episodes.  Pt states she wants to be seen and evaluated to make sure there is no cardiac issues going on, and to hopefully get her overdue labs done.  Scheduled the pt to come in and see Richardson Dopp PA-C for next Tuesday 5/4 at 1145.  Advised the pt to arrive 15 mins prior to this appt.  Informed the pt that I will route this message to Dr. [name] as an Juluis Rainier, to make her aware of this plan. Advised the pt that if her symptoms worsen between now and her OV with Nicki Reaper, then she should seek immediate medical attention.  Pt education provided on what warrants immediate medical attention.  Pt verbalized understanding and agrees with this plan.

## 2019-09-30 NOTE — Telephone Encounter (Signed)
Pt c/o Shortness Of Breath: STAT if SOB developed within the last 24 hours or pt is noticeably SOB on the phone  1. Are you currently SOB (can you hear that pt is SOB on the phone)? No   2. How long have you been experiencing SOB? Several weeks   3. Are you SOB when sitting or when up moving around? Moving Around   4. Are you currently experiencing any other symptoms? Fatigue   Angela [name] is calling stating she has been experiencing SOB for several weeks when moving around. She states she has also been experiencing Fatigue. She would like an appointment scheduled in regards to this. Please advise.

## 2019-10-03 NOTE — H&P (View-Only) (Signed)
Cardiology Office Note:    Date:  10/04/2019   ID:  Angela [name], DOB 01-Sep-1945, MRN [id number]  PCP:  [name], MD  Cardiologist:  Ena Dawley, MD  Electrophysiologist:  None   Referring MD: [name], MD   Chief Complaint:  Chest Pain and Shortness of Breath    Patient Profile:    Angela [name] is a 74 y.o. female with:   Coronary artery disease   Mod non-obstructive by cath in 2017: oD1 70-75, pLAD 30-40, mRCA 25   Med Rx  Chronic diastolic CHF  Hypertension   Intol of Amlodipine (edema)  Hyperlipidemia   Gastroparesis  Hx of tachycardia  Hx of dyspnea   Prior CV studies: Echocardiogram 06/02/2017 Mild conc LVH, EF 65-70 (hyperdynamic with mild intracavitary gradient during systole), no RWMA, Gr 1 DD, very mild AS, mild TR  Cardiac catheterization 12/26/15 LM normal LAD ost 30, prox 40; D1 ost 70-75 RCA prox 25 EF 55-65  Myoview 12/05/15 EF 74, inf defect (diaph att vs ischemia), intermediate risk   History of Present Illness:    Ms. Jankowski was last seen in clinic by [name], PA-C in 09/2017.  She called in recently with symptoms of shortness of breath and is seen for evaluation.  She is here alone.  She has felt fatigued and has noted dyspnea with exertion over the past few months.  It seems to be getting worse, with less and less activity.  She has had some chest pressure.  She has had the symptoms of chest pressure since her diagnosis of CAD.  Her antianginal therapy had improved her symptoms in the past.  However, recently, her symptoms have worsened.  She has had to stop intercourse due to severe chest pain.  She has had some radiation to her interscapular area.  She has not had any associated nausea or syncope.  She has had some mild diaphoresis.  She sleeps on one pillow chronically without change.  She has not had significant leg swelling.  She has been somewhat dizzy and off balance.  She is also felt a skipping sensation from time to  time.  Past Medical History:  Diagnosis Date  . Anxiety   . Cancer of sigmoid ([place])   . Depression (emotion)   . DJD (degenerative joint disease)   . Fibromyalgia   . GERD (gastroesophageal reflux disease)   . Hemorrhoids   . Hyperlipidemia   . Hypertension   . IBS (irritable bowel syndrome)   . MRSA (methicillin resistant Staphylococcus aureus) septicemia ([place])    history of  . Over weight   . Pancreatitis   . RLS (restless legs syndrome)   . Tachycardia     Current Medications: Current Meds  Medication Sig  . aspirin EC 81 MG tablet Take 81 mg by mouth daily.  . Cyanocobalamin (B-12) 2500 MCG TABS Take 2,500 mcg by mouth daily.   . DULoxetine (CYMBALTA) 20 MG capsule Take 20 mg by mouth 2 (two) times daily.   Marland Kitchen escitalopram (LEXAPRO) 10 MG tablet Take 10 mg by mouth daily.  Marland Kitchen esomeprazole (NEXIUM) 40 MG capsule Take 40 mg by mouth daily.  . fluticasone (FLONASE) 50 MCG/ACT nasal spray Place 2 sprays into both nostrils daily as needed (seasonal allergies).   Marland Kitchen levorphanol (LEVODROMORAN) 2 MG tablet Take 2 mg by mouth every 8 (eight) hours.  Marland Kitchen LORazepam (ATIVAN) 2 MG tablet Take 2 mg by mouth at bedtime.   Marland Kitchen lubiprostone (AMITIZA) 24 MCG capsule  Take 24 mcg by mouth daily.  . metoprolol tartrate (LOPRESSOR) 100 MG tablet Take 100 mg by mouth 2 (two) times daily.  . Milnacipran HCl (SAVELLA) 100 MG TABS tablet Take 100 mg by mouth 2 (two) times daily.  . nitroGLYCERIN (NITROSTAT) 0.4 MG SL tablet Place 0.4 mg under the tongue every 5 (five) minutes as needed for chest pain.  Marland Kitchen ondansetron (ZOFRAN) 4 MG tablet Take 4 mg by mouth every 8 (eight) hours as needed for nausea or vomiting.  Marland Kitchen rOPINIRole (REQUIP) 1 MG tablet Take 1-2 mg by mouth See admin instructions. Take 2 tablets (2 mg) by mouth daily at bedtime, may also take 1 tablet (1 mg) in the afternoon as needed for restless legs  . rosuvastatin (CRESTOR) 10 MG tablet Take 1 tablet (10 mg total) by mouth daily.  Marland Kitchen tiZANidine  (ZANAFLEX) 4 MG tablet Take 4 mg by mouth every 8 (eight) hours as needed for muscle spasms.   . [DISCONTINUED] isosorbide mononitrate (IMDUR) 30 MG 24 hr tablet Take 1 tablet (30 mg total) by mouth daily.     Allergies:   Norvasc [amlodipine besylate], Phenergan [promethazine], Timentin [ticarcillin-pot clavulanate], Pregabalin, and Reglan [metoclopramide]   Social History   Tobacco Use  . Smoking status: Never Smoker  . Smokeless tobacco: Never Used  Substance Use Topics  . Alcohol use: No    Alcohol/week: 0.0 standard drinks  . Drug use: No     Family Hx: The patient's family history includes Diabetes in her sister and another family member; Heart attack in her father; Heart disease in her mother and another family member; Hypertension in her mother and sister; Stroke in her father and another family member.  Review of Systems  Constitution: Negative for fever.  Cardiovascular: Positive for palpitations.  Respiratory: Negative for cough.   Gastrointestinal: Negative for hematochezia and melena.  Genitourinary: Negative for hematuria.  Neurological: Positive for dizziness and loss of balance.     EKGs/Labs/Other Test Reviewed:    EKG:  EKG is  ordered today.  The ekg ordered today demonstrates normal sinus rhythm, heart rate 67, leftward axis, incomplete right bundle branch block, no ST-T wave changes, QTC 471  Recent Labs: 11/30/2018: ALT 23 12/01/2018: Magnesium 1.9 12/02/2018: BUN 11; Creatinine, Ser 1.20; Hemoglobin 11.1; Platelets 203; Potassium 3.8; Sodium 140   Recent Lipid Panel Lab Results  Component Value Date/Time   CHOL 227 (H) 09/21/2017 09:37 AM   TRIG 172 (H) 09/21/2017 09:37 AM   HDL 44 09/21/2017 09:37 AM   CHOLHDL 5.2 (H) 09/21/2017 09:37 AM   CHOLHDL 3.0 02/20/2016 12:05 PM   LDLCALC 149 (H) 09/21/2017 09:37 AM    Physical Exam:    VS:  BP 140/60   Pulse 67   Ht 5\' 6"  (1.676 m)   Wt 176 lb (79.8 kg)   SpO2 93%   BMI 28.41 kg/m     Wt Readings  from Last 3 Encounters:  10/04/19 176 lb (79.8 kg)  11/30/18 176 lb (79.8 kg)  10/28/17 172 lb 12.8 oz (78.4 kg)     Constitutional:      Appearance: Healthy appearance. Not in distress.  Neck:     Thyroid: Thyroid normal.     Vascular: JVD normal.     Lymphadenopathy: No cervical adenopathy.  Pulmonary:     Effort: Pulmonary effort is normal.     Breath sounds: No wheezing. No rales.  Cardiovascular:     Normal rate. Regular rhythm. Normal S1. Normal S2.  Murmurs: There is a grade 1/6 systolic murmur at the URSB.  Edema:    Peripheral edema absent.  Abdominal:     Palpations: Abdomen is soft. There is no hepatomegaly.  Skin:    General: Skin is warm and dry.  Neurological:     Mental Status: Alert and oriented to person, place and time.     Cranial Nerves: Cranial nerves are intact.  Psychiatric:        Mood and Affect: Affect normal.      ASSESSMENT & PLAN:    1. Coronary artery disease involving native coronary artery of native heart with angina pectoris ([place]) 2. Precordial chest pain 3. Shortness of breath She has a history of moderate nonobstructive coronary artery disease.  The most significant lesion on her cardiac catheterization in 2017 was a 70-75% ostial diagonal stenosis.  She has been managed medically over the years with adequate control of her angina.  Over the last couple of months she has developed fatigue as well as worsening dyspnea on exertion as well as worsening chest discomfort.  She seems to be describing CCS class III angina despite being on 2 antianginal agents.  She had a prior Myoview that was abnormal in 2017 that prompted her cardiac catheterization.  I have recommended that we proceed with cardiac catheterization to better evaluate her symptoms.  I reviewed this with Dr. Acie Fredrickson (attending MD) who agreed.  Risks and benefits of cardiac catheterization have been discussed with the patient.  These include bleeding, infection, kidney damage, stroke,  heart attack, death.  The patient understands these risks and is willing to proceed.   -Arrange cardiac catheterization in the next 1 week  -Increase isosorbide to 60 mg daily  -Obtain BNP with blood work; add furosemide if significantly elevated  -Follow-up 2 weeks post cath   4. Essential hypertension Blood pressure uncontrolled.  Increase isosorbide as noted.  Continue current dose of metoprolol.  5. Mixed hyperlipidemia Continue statin therapy.  LDL in October 2019 was 95.  Depending upon the results of her cardiac catheterization, we may need to intensify her therapy.    Dispo:  Return in about 2 weeks (around 10/18/2019) for Post Procedure Follow Up Dr. [name], or PA/NP.   Medication Adjustments/Labs and Tests Ordered: Current medicines are reviewed at length with the patient today.  Concerns regarding medicines are outlined above.  Tests Ordered: Orders Placed This Encounter  Procedures  . CBC  . Basic metabolic panel  . Pro b natriuretic peptide (BNP)  . EKG 12-Lead   Medication Changes: Meds ordered this encounter  Medications  . isosorbide mononitrate (IMDUR) 60 MG 24 hr tablet    Sig: Take 1 tablet (60 mg total) by mouth daily.    Dispense:  90 tablet    Refill:  3    Signed, Richardson Dopp, PA-C  10/04/2019 5:48 PM    [facility] [place], [place], [place]  [address] Phone: [phone]; Fax: [phone]

## 2019-10-03 NOTE — Progress Notes (Signed)
Cardiology Office Note:    Date:  10/04/2019   ID:  MILISA LUU, DOB 24-Oct-1945, MRN [id number]  PCP:  [name], MD  Cardiologist:  Ena Dawley, MD  Electrophysiologist:  None   Referring MD: [name], MD   Chief Complaint:  Chest Pain and Shortness of Breath    Patient Profile:    Angela [name] is a 74 y.o. female with:   Coronary artery disease   Mod non-obstructive by cath in 2017: oD1 70-75, pLAD 30-40, mRCA 25   Med Rx  Chronic diastolic CHF  Hypertension   Intol of Amlodipine (edema)  Hyperlipidemia   Gastroparesis  Hx of tachycardia  Hx of dyspnea   Prior CV studies: Echocardiogram 06/02/2017 Mild conc LVH, EF 65-70 (hyperdynamic with mild intracavitary gradient during systole), no RWMA, Gr 1 DD, very mild AS, mild TR  Cardiac catheterization 12/26/15 LM normal LAD ost 30, prox 40; D1 ost 70-75 RCA prox 25 EF 55-65  Myoview 12/05/15 EF 74, inf defect (diaph att vs ischemia), intermediate risk   History of Present Illness:    Angela [name] was last seen in clinic by [name], PA-C in 09/2017.  She called in recently with symptoms of shortness of breath and is seen for evaluation.  She is here alone.  She has felt fatigued and has noted dyspnea with exertion over the past few months.  It seems to be getting worse, with less and less activity.  She has had some chest pressure.  She has had the symptoms of chest pressure since her diagnosis of CAD.  Her antianginal therapy had improved her symptoms in the past.  However, recently, her symptoms have worsened.  She has had to stop intercourse due to severe chest pain.  She has had some radiation to her interscapular area.  She has not had any associated nausea or syncope.  She has had some mild diaphoresis.  She sleeps on one pillow chronically without change.  She has not had significant leg swelling.  She has been somewhat dizzy and off balance.  She is also felt a skipping sensation from time to  time.  Past Medical History:  Diagnosis Date  . Anxiety   . Cancer of sigmoid ([place])   . Depression (emotion)   . DJD (degenerative joint disease)   . Fibromyalgia   . GERD (gastroesophageal reflux disease)   . Hemorrhoids   . Hyperlipidemia   . Hypertension   . IBS (irritable bowel syndrome)   . MRSA (methicillin resistant Staphylococcus aureus) septicemia ([place])    history of  . Over weight   . Pancreatitis   . RLS (restless legs syndrome)   . Tachycardia     Current Medications: Current Meds  Medication Sig  . aspirin EC 81 MG tablet Take 81 mg by mouth daily.  . Cyanocobalamin (B-12) 2500 MCG TABS Take 2,500 mcg by mouth daily.   . DULoxetine (CYMBALTA) 20 MG capsule Take 20 mg by mouth 2 (two) times daily.   Marland Kitchen escitalopram (LEXAPRO) 10 MG tablet Take 10 mg by mouth daily.  Marland Kitchen esomeprazole (NEXIUM) 40 MG capsule Take 40 mg by mouth daily.  . fluticasone (FLONASE) 50 MCG/ACT nasal spray Place 2 sprays into both nostrils daily as needed (seasonal allergies).   Marland Kitchen levorphanol (LEVODROMORAN) 2 MG tablet Take 2 mg by mouth every 8 (eight) hours.  Marland Kitchen LORazepam (ATIVAN) 2 MG tablet Take 2 mg by mouth at bedtime.   Marland Kitchen lubiprostone (AMITIZA) 24 MCG capsule  Take 24 mcg by mouth daily.  . metoprolol tartrate (LOPRESSOR) 100 MG tablet Take 100 mg by mouth 2 (two) times daily.  . Milnacipran HCl (SAVELLA) 100 MG TABS tablet Take 100 mg by mouth 2 (two) times daily.  . nitroGLYCERIN (NITROSTAT) 0.4 MG SL tablet Place 0.4 mg under the tongue every 5 (five) minutes as needed for chest pain.  Marland Kitchen ondansetron (ZOFRAN) 4 MG tablet Take 4 mg by mouth every 8 (eight) hours as needed for nausea or vomiting.  Marland Kitchen rOPINIRole (REQUIP) 1 MG tablet Take 1-2 mg by mouth See admin instructions. Take 2 tablets (2 mg) by mouth daily at bedtime, may also take 1 tablet (1 mg) in the afternoon as needed for restless legs  . rosuvastatin (CRESTOR) 10 MG tablet Take 1 tablet (10 mg total) by mouth daily.  Marland Kitchen tiZANidine  (ZANAFLEX) 4 MG tablet Take 4 mg by mouth every 8 (eight) hours as needed for muscle spasms.   . [DISCONTINUED] isosorbide mononitrate (IMDUR) 30 MG 24 hr tablet Take 1 tablet (30 mg total) by mouth daily.     Allergies:   Norvasc [amlodipine besylate], Phenergan [promethazine], Timentin [ticarcillin-pot clavulanate], Pregabalin, and Reglan [metoclopramide]   Social History   Tobacco Use  . Smoking status: Never Smoker  . Smokeless tobacco: Never Used  Substance Use Topics  . Alcohol use: No    Alcohol/week: 0.0 standard drinks  . Drug use: No     Family Hx: The patient's family history includes Diabetes in her sister and another family member; Heart attack in her father; Heart disease in her mother and another family member; Hypertension in her mother and sister; Stroke in her father and another family member.  Review of Systems  Constitution: Negative for fever.  Cardiovascular: Positive for palpitations.  Respiratory: Negative for cough.   Gastrointestinal: Negative for hematochezia and melena.  Genitourinary: Negative for hematuria.  Neurological: Positive for dizziness and loss of balance.     EKGs/Labs/Other Test Reviewed:    EKG:  EKG is  ordered today.  The ekg ordered today demonstrates normal sinus rhythm, heart rate 67, leftward axis, incomplete right bundle branch block, no ST-T wave changes, QTC 471  Recent Labs: 11/30/2018: ALT 23 12/01/2018: Magnesium 1.9 12/02/2018: BUN 11; Creatinine, Ser 1.20; Hemoglobin 11.1; Platelets 203; Potassium 3.8; Sodium 140   Recent Lipid Panel Lab Results  Component Value Date/Time   CHOL 227 (H) 09/21/2017 09:37 AM   TRIG 172 (H) 09/21/2017 09:37 AM   HDL 44 09/21/2017 09:37 AM   CHOLHDL 5.2 (H) 09/21/2017 09:37 AM   CHOLHDL 3.0 02/20/2016 12:05 PM   LDLCALC 149 (H) 09/21/2017 09:37 AM    Physical Exam:    VS:  BP 140/60   Pulse 67   Ht 5\' 6"  (1.676 m)   Wt 176 lb (79.8 kg)   SpO2 93%   BMI 28.41 kg/m     Wt Readings  from Last 3 Encounters:  10/04/19 176 lb (79.8 kg)  11/30/18 176 lb (79.8 kg)  10/28/17 172 lb 12.8 oz (78.4 kg)     Constitutional:      Appearance: Healthy appearance. Not in distress.  Neck:     Thyroid: Thyroid normal.     Vascular: JVD normal.     Lymphadenopathy: No cervical adenopathy.  Pulmonary:     Effort: Pulmonary effort is normal.     Breath sounds: No wheezing. No rales.  Cardiovascular:     Normal rate. Regular rhythm. Normal S1. Normal S2.  Murmurs: There is a grade 1/6 systolic murmur at the URSB.  Edema:    Peripheral edema absent.  Abdominal:     Palpations: Abdomen is soft. There is no hepatomegaly.  Skin:    General: Skin is warm and dry.  Neurological:     Mental Status: Alert and oriented to person, place and time.     Cranial Nerves: Cranial nerves are intact.  Psychiatric:        Mood and Affect: Affect normal.      ASSESSMENT & PLAN:    1. Coronary artery disease involving native coronary artery of native heart with angina pectoris ([place]) 2. Precordial chest pain 3. Shortness of breath She has a history of moderate nonobstructive coronary artery disease.  The most significant lesion on her cardiac catheterization in 2017 was a 70-75% ostial diagonal stenosis.  She has been managed medically over the years with adequate control of her angina.  Over the last couple of months she has developed fatigue as well as worsening dyspnea on exertion as well as worsening chest discomfort.  She seems to be describing CCS class III angina despite being on 2 antianginal agents.  She had a prior Myoview that was abnormal in 2017 that prompted her cardiac catheterization.  I have recommended that we proceed with cardiac catheterization to better evaluate her symptoms.  I reviewed this with Dr. Acie Fredrickson (attending MD) who agreed.  Risks and benefits of cardiac catheterization have been discussed with the patient.  These include bleeding, infection, kidney damage, stroke,  heart attack, death.  The patient understands these risks and is willing to proceed.   -Arrange cardiac catheterization in the next 1 week  -Increase isosorbide to 60 mg daily  -Obtain BNP with blood work; add furosemide if significantly elevated  -Follow-up 2 weeks post cath   4. Essential hypertension Blood pressure uncontrolled.  Increase isosorbide as noted.  Continue current dose of metoprolol.  5. Mixed hyperlipidemia Continue statin therapy.  LDL in October 2019 was 95.  Depending upon the results of her cardiac catheterization, we may need to intensify her therapy.    Dispo:  Return in about 2 weeks (around 10/18/2019) for Post Procedure Follow Up Dr. [name], or PA/NP.   Medication Adjustments/Labs and Tests Ordered: Current medicines are reviewed at length with the patient today.  Concerns regarding medicines are outlined above.  Tests Ordered: Orders Placed This Encounter  Procedures  . CBC  . Basic metabolic panel  . Pro b natriuretic peptide (BNP)  . EKG 12-Lead   Medication Changes: Meds ordered this encounter  Medications  . isosorbide mononitrate (IMDUR) 60 MG 24 hr tablet    Sig: Take 1 tablet (60 mg total) by mouth daily.    Dispense:  90 tablet    Refill:  3    Signed, Richardson Dopp, PA-C  10/04/2019 5:48 PM    [facility] [place], [place], [place]  [address] Phone: [phone]; Fax: [phone]

## 2019-10-04 ENCOUNTER — Other Ambulatory Visit: Payer: Self-pay

## 2019-10-04 ENCOUNTER — Ambulatory Visit (INDEPENDENT_AMBULATORY_CARE_PROVIDER_SITE_OTHER): Payer: Medicare Other | Admitting: Physician Assistant

## 2019-10-04 ENCOUNTER — Encounter: Payer: Self-pay | Admitting: Physician Assistant

## 2019-10-04 VITALS — BP 140/60 | HR 67 | Ht 66.0 in | Wt 176.0 lb

## 2019-10-04 DIAGNOSIS — R0602 Shortness of breath: Secondary | ICD-10-CM | POA: Diagnosis not present

## 2019-10-04 DIAGNOSIS — I25119 Atherosclerotic heart disease of native coronary artery with unspecified angina pectoris: Secondary | ICD-10-CM | POA: Diagnosis not present

## 2019-10-04 DIAGNOSIS — R072 Precordial pain: Secondary | ICD-10-CM | POA: Diagnosis not present

## 2019-10-04 DIAGNOSIS — E782 Mixed hyperlipidemia: Secondary | ICD-10-CM | POA: Diagnosis not present

## 2019-10-04 DIAGNOSIS — I10 Essential (primary) hypertension: Secondary | ICD-10-CM

## 2019-10-04 MED ORDER — ISOSORBIDE MONONITRATE ER 60 MG PO TB24: 60.0000 mg | ORAL_TABLET | Freq: Every day | ORAL | 3 refills | Status: DC

## 2019-10-04 NOTE — Patient Instructions (Addendum)
Medication Instructions:  Your physician has recommended you make the following change in your medication:  1.  INCREASE the Imdur to 60 mg daily.  You can take 2 of the 30 mg tablets at once to use them up  *If you need a refill on your cardiac medications before your next appointment, please call your pharmacy*   Lab Work: TODAY:  BMET, CBC, & PRO BNP\  Monday, 10/1019:  YOU WILL NEED TO GO TO [address], FOR COVID TEST.  ONCE YOU ARE SWABBED, YOU WILL BE ASKED TO GO STRAIGHT HOME AND QUARANTINE UNTIL YOUR PROCEDURE.  WE ASK NO OUTSIDE VISITORS IN YOUR HOME. ONLY THE PEOPLE THAT LIVE THERE. If you have labs (blood work) drawn today and your tests are completely normal, you will receive your results only by: Marland Kitchen MyChart Message (if you have MyChart) OR . A paper copy in the mail If you have any lab test that is abnormal or we need to change your treatment, we will call you to review the results.   Testing/Procedures: Your physician has requested that you have a cardiac catheterization. Cardiac catheterization is used to diagnose and/or treat various heart conditions. Doctors may recommend this procedure for a number of different reasons. The most common reason is to evaluate chest pain. Chest pain can be a symptom of coronary artery disease (CAD), and cardiac catheterization can show whether plaque is narrowing or blocking your heart's arteries. This procedure is also used to evaluate the valves, as well as measure the blood flow and oxygen levels in different parts of your heart. For further information please visit [email or web address]. Please follow instruction sheet, BELOW:     [facility] [place] OFFICE [place], [place] [address] Dept: [phone] Loc: [place]  10/04/2019  You are scheduled for a Cardiac Catheterization on Wednesday, May 12  with Dr. Sherren Mocha.  1. Please arrive at the [facility] (Main Entrance A) at [facility]: [address] [place], [place] [address] at 6:30 AM (This time is two hours before your procedure to ensure your preparation). Free valet parking service is available.   Special note: Every effort is made to have your procedure done on time. Please understand that emergencies sometimes delay scheduled procedures.  2. Diet: Do not eat solid foods after midnight.  The patient may have clear liquids until 5am upon the day of the procedure.  3. Labs: You will need to have blood drawn on TODAY  4. Medication instructions in preparation for your procedure: You may take your regular morning medications on the day of, with just enough water to swallow.   Contrast Allergy: No  On the morning of your procedure, take your Aspirin and any morning medicines NOT listed above.  You may use sips of water.  5. Plan for one night stay--bring personal belongings. 6. Bring a current list of your medications and current insurance cards. 7. You MUST have a responsible person to drive you home. 8. Someone MUST be with you the first 24 hours after you arrive home or your discharge will be delayed. 9. Please wear clothes that are easy to get on and off and wear slip-on shoes.  Thank you for allowing [place] to care for you!   -- [facility]       Follow-Up: At [facility], you and your health needs are  our priority.  As part of our continuing mission to provide you with exceptional heart care, we have created designated Provider Care Teams.  These Care Teams include your primary Cardiologist (physician) and Advanced Practice Providers (APPs -  Physician Assistants and Nurse Practitioners) who all work together to provide you with the care you need, when you need it.  We recommend signing up for the patient portal called "MyChart".  Sign up information is provided on this  After Visit Summary.  MyChart is used to connect with patients for Virtual Visits (Telemedicine).  Patients are able to view lab/test results, encounter notes, upcoming appointments, etc.  Non-urgent messages can be sent to your provider as well.   To learn more about what you can do with MyChart, go to [email or web address].    Your next appointment:   2 week(s)  The format for your next appointment:   In Person  Provider:   Richardson Dopp, PA-C   Other Instructions

## 2019-10-05 ENCOUNTER — Telehealth: Payer: Self-pay | Admitting: Physician Assistant

## 2019-10-05 LAB — BASIC METABOLIC PANEL
BUN/Creatinine Ratio: 12 (ref 12–28)
BUN: 15 mg/dL (ref 8–27)
CO2: 23 mmol/L (ref 20–29)
Calcium: 9.2 mg/dL (ref 8.7–10.3)
Chloride: 107 mmol/L — ABNORMAL HIGH (ref 96–106)
Creatinine, Ser: 1.24 mg/dL — ABNORMAL HIGH (ref 0.57–1.00)
GFR calc Af Amer: 50 mL/min/{1.73_m2} — ABNORMAL LOW (ref >59)
GFR calc non Af Amer: 43 mL/min/{1.73_m2} — ABNORMAL LOW (ref >59)
Glucose: 88 mg/dL (ref 65–99)
Potassium: 4.8 mmol/L (ref 3.5–5.2)
Sodium: 145 mmol/L — ABNORMAL HIGH (ref 134–144)

## 2019-10-05 LAB — CBC
Hematocrit: 35.4 % (ref 34.0–46.6)
Hemoglobin: 11.8 g/dL (ref 11.1–15.9)
MCH: 30.4 pg (ref 26.6–33.0)
MCHC: 33.3 g/dL (ref 31.5–35.7)
MCV: 91 fL (ref 79–97)
Platelets: 271 10*3/uL (ref 150–450)
RBC: 3.88 x10E6/uL (ref 3.77–5.28)
RDW: 13.5 % (ref 11.7–15.4)
WBC: 5.5 10*3/uL (ref 3.4–10.8)

## 2019-10-05 LAB — PRO B NATRIURETIC PEPTIDE: NT-Pro BNP: 721 pg/mL — ABNORMAL HIGH (ref 0–301)

## 2019-10-05 NOTE — Telephone Encounter (Signed)
New Message:   Pt wants to cx her Cath for 10-12-19. She would like to reschedule it for 10-27-19,10-28-19, 11-03-19 or 11-04-19.

## 2019-10-05 NOTE — Telephone Encounter (Signed)
Returned call to pt.  She has been made aware that her cath has been rescheduled to 5/28 with her arrival time is 5:30 a.m.Marland Kitchen She is aware to go for her covid19 testing on Wednesday 5/26 at 10:00.  Pt was very grateful.

## 2019-10-10 ENCOUNTER — Other Ambulatory Visit (HOSPITAL_COMMUNITY): Payer: Medicare Other

## 2019-10-12 ENCOUNTER — Telehealth: Payer: Self-pay

## 2019-10-12 MED ORDER — FUROSEMIDE 20 MG PO TABS: ORAL_TABLET | ORAL | 3 refills | Status: DC

## 2019-10-12 NOTE — Telephone Encounter (Signed)
I called and left patient a detailed message with lab results. Ok per patient DPR. Advised patient to take Furosemide 20 mg, 1 tablet by mouth once a day, then take as needed for leg swelling or weight gain of 3 pounds or more in one day. Advised patient to call back to [phone] for pharmacy verification.

## 2019-10-12 NOTE — Telephone Encounter (Signed)
-----   Message from Liliane Shi, PA-C sent at 10/05/2019  5:42 PM EDT ----- Hgb, K+ normal.  Creatinine stable.  BNP minimally elevated.  PLAN:   - Furosemide 20 mg once daily x 3 days.  Then take prn for leg swelling or weight gain of 3 lbs or more in 1 day. Richardson Dopp, PA-C    10/05/2019 5:37 PM

## 2019-10-12 NOTE — Telephone Encounter (Signed)
The patients daughter Vania Rea has been notified of the result and verbalized understanding. Ok to speak with Vania Rea per patient DPR. All questions (if any) were answered. Vania Rea is aware to have patient start Furosemide 20 mg, 1 tablet by mouth once a day for 3 days, then take as needed for leg swelling or weight gain of 3 pounds or more in 1 day. Prescription sent to [facility], pharmacy verified by patients daughter Vania Rea. Mady Haagensen, [place] 10/12/2019 11:35 AM

## 2019-10-19 DIAGNOSIS — M5030 Other cervical disc degeneration, unspecified cervical region: Secondary | ICD-10-CM | POA: Diagnosis not present

## 2019-10-19 DIAGNOSIS — G894 Chronic pain syndrome: Secondary | ICD-10-CM | POA: Diagnosis not present

## 2019-10-19 DIAGNOSIS — M797 Fibromyalgia: Secondary | ICD-10-CM | POA: Diagnosis not present

## 2019-10-19 DIAGNOSIS — M546 Pain in thoracic spine: Secondary | ICD-10-CM | POA: Diagnosis not present

## 2019-10-24 ENCOUNTER — Telehealth: Payer: Self-pay | Admitting: Cardiology

## 2019-10-24 NOTE — Telephone Encounter (Signed)
I called and spoke with patients husband Angela [name]. Ok per patient DPR to speak with Angela [name]. Angela [name] is aware that patient does not need any lab work done since it was done on 10/04/19. Angela [name] is aware that patient needs to have her pre procedure Covid test done on 10/26/19 at 10:05. Angela [name] verbalized understanding and stated he will pass this along to patient and thanked me for the call.

## 2019-10-24 NOTE — Telephone Encounter (Signed)
New Message    Pt is calling and is wondering if she is needing to get labs before her procedure    Please advise

## 2019-10-26 ENCOUNTER — Other Ambulatory Visit: Payer: Medicare Other | Admitting: *Deleted

## 2019-10-26 ENCOUNTER — Other Ambulatory Visit: Payer: Self-pay

## 2019-10-26 ENCOUNTER — Ambulatory Visit: Payer: Medicare Other | Admitting: Physician Assistant

## 2019-10-26 ENCOUNTER — Other Ambulatory Visit (HOSPITAL_COMMUNITY)
Admission: RE | Admit: 2019-10-26 | Discharge: 2019-10-26 | Disposition: A | Discharge status: Home / Self Care | Payer: Medicare Other | Source: Ambulatory Visit | Attending: Cardiovascular Disease | Admitting: Cardiovascular Disease

## 2019-10-26 ENCOUNTER — Telehealth: Payer: Self-pay | Admitting: *Deleted

## 2019-10-26 DIAGNOSIS — Z01812 Encounter for preprocedural laboratory examination: Secondary | ICD-10-CM | POA: Diagnosis not present

## 2019-10-26 DIAGNOSIS — Z20822 Contact with and (suspected) exposure to covid-19: Secondary | ICD-10-CM | POA: Insufficient documentation

## 2019-10-26 DIAGNOSIS — I25119 Atherosclerotic heart disease of native coronary artery with unspecified angina pectoris: Secondary | ICD-10-CM

## 2019-10-26 LAB — BASIC METABOLIC PANEL
BUN/Creatinine Ratio: 15 (ref 12–28)
BUN: 16 mg/dL (ref 8–27)
CO2: 30 mmol/L — ABNORMAL HIGH (ref 20–29)
Calcium: 9.6 mg/dL (ref 8.7–10.3)
Chloride: 105 mmol/L (ref 96–106)
Creatinine, Ser: 1.07 mg/dL — ABNORMAL HIGH (ref 0.57–1.00)
GFR calc Af Amer: 60 mL/min/{1.73_m2} (ref >59)
GFR calc non Af Amer: 52 mL/min/{1.73_m2} — ABNORMAL LOW (ref >59)
Glucose: 124 mg/dL — ABNORMAL HIGH (ref 65–99)
Potassium: 4.1 mmol/L (ref 3.5–5.2)
Sodium: 144 mmol/L (ref 134–144)

## 2019-10-26 LAB — SARS CORONAVIRUS 2 (TAT 6-24 HRS): SARS Coronavirus 2: NEGATIVE

## 2019-10-26 NOTE — Telephone Encounter (Signed)
Reviewed with Richardson Dopp, PA. Call to patient to ask her to go to [facility] lab this morning to get BMP. Pt aware I will follow up with her tomorrow, if GFR < 45 will make arrangements for pre procedure hydration prior to [facility] 10/28/19.

## 2019-10-27 NOTE — Telephone Encounter (Signed)
Pt contacted pre-catheterization scheduled at [facility] for: Friday Oct 28, 2019 7:30 AM Verified arrival time and place: [facility]) at: 5:30 AM   No solid food after midnight prior to cath, clear liquids until 5 AM day of procedure.  Hold: Lasix-day before and day of procedure-GFR 52  Except hold medications AM meds can be  taken pre-cath with sip of water including: ASA 81 mg   Confirmed patient has responsible adult to drive home post procedure and observe 24 hours after arriving home: yes  You are allowed ONE visitor in the waiting room during your procedure. Both you and your visitor must wear masks.      COVID-19 Pre-Screening Questions:  . In the past 7 to 10 days have you had a cough,  shortness of breath, headache, congestion, fever (100 or greater) body aches, chills, sore throat, or sudden loss of taste or sense of smell? Shortness of breath-not new  . Have you been around anyone with known Covid 19 in the past 7 to 10 days? no . Have you been around anyone who is awaiting Covid 19 test results in the past 7 to 10 days? no . Have you been around anyone who has mentioned symptoms of Covid 19 within the past 7 to 10 days? no  Reviewed procedure/mask/visitor instructions, COVID-19 screening questions with patient.

## 2019-10-28 ENCOUNTER — Other Ambulatory Visit: Payer: Self-pay | Admitting: *Deleted

## 2019-10-28 ENCOUNTER — Ambulatory Visit (HOSPITAL_COMMUNITY)
Admission: RE | Admit: 2019-10-28 | Discharge: 2019-10-28 | Disposition: A | Discharge status: Home / Self Care | Payer: Medicare Other | Attending: Cardiovascular Disease | Admitting: Cardiovascular Disease

## 2019-10-28 ENCOUNTER — Encounter (HOSPITAL_COMMUNITY)
Admission: RE | Disposition: A | Discharge status: Home / Self Care | Payer: Self-pay | Source: Home / Self Care | Attending: Cardiovascular Disease

## 2019-10-28 ENCOUNTER — Other Ambulatory Visit: Payer: Self-pay

## 2019-10-28 DIAGNOSIS — I5032 Chronic diastolic (congestive) heart failure: Secondary | ICD-10-CM | POA: Insufficient documentation

## 2019-10-28 DIAGNOSIS — Z79899 Other long term (current) drug therapy: Secondary | ICD-10-CM | POA: Diagnosis not present

## 2019-10-28 DIAGNOSIS — E782 Mixed hyperlipidemia: Secondary | ICD-10-CM | POA: Insufficient documentation

## 2019-10-28 DIAGNOSIS — I25119 Atherosclerotic heart disease of native coronary artery with unspecified angina pectoris: Secondary | ICD-10-CM

## 2019-10-28 DIAGNOSIS — I110 Hypertensive heart disease with heart failure: Secondary | ICD-10-CM | POA: Diagnosis not present

## 2019-10-28 DIAGNOSIS — G2581 Restless legs syndrome: Secondary | ICD-10-CM | POA: Insufficient documentation

## 2019-10-28 DIAGNOSIS — K3184 Gastroparesis: Secondary | ICD-10-CM | POA: Insufficient documentation

## 2019-10-28 DIAGNOSIS — I4510 Unspecified right bundle-branch block: Secondary | ICD-10-CM | POA: Diagnosis not present

## 2019-10-28 DIAGNOSIS — R0602 Shortness of breath: Secondary | ICD-10-CM | POA: Insufficient documentation

## 2019-10-28 DIAGNOSIS — Z6828 Body mass index (BMI) 28.0-28.9, adult: Secondary | ICD-10-CM | POA: Insufficient documentation

## 2019-10-28 DIAGNOSIS — I25110 Atherosclerotic heart disease of native coronary artery with unstable angina pectoris: Secondary | ICD-10-CM | POA: Diagnosis not present

## 2019-10-28 DIAGNOSIS — Z888 Allergy status to other drugs, medicaments and biological substances status: Secondary | ICD-10-CM | POA: Insufficient documentation

## 2019-10-28 DIAGNOSIS — Z95820 Peripheral vascular angioplasty status with implants and grafts: Secondary | ICD-10-CM

## 2019-10-28 DIAGNOSIS — F419 Anxiety disorder, unspecified: Secondary | ICD-10-CM | POA: Insufficient documentation

## 2019-10-28 DIAGNOSIS — I10 Essential (primary) hypertension: Secondary | ICD-10-CM | POA: Diagnosis present

## 2019-10-28 DIAGNOSIS — Z85038 Personal history of other malignant neoplasm of large intestine: Secondary | ICD-10-CM | POA: Insufficient documentation

## 2019-10-28 DIAGNOSIS — M797 Fibromyalgia: Secondary | ICD-10-CM | POA: Insufficient documentation

## 2019-10-28 DIAGNOSIS — R072 Precordial pain: Secondary | ICD-10-CM

## 2019-10-28 DIAGNOSIS — I200 Unstable angina: Secondary | ICD-10-CM

## 2019-10-28 DIAGNOSIS — E785 Hyperlipidemia, unspecified: Secondary | ICD-10-CM

## 2019-10-28 DIAGNOSIS — K219 Gastro-esophageal reflux disease without esophagitis: Secondary | ICD-10-CM | POA: Diagnosis not present

## 2019-10-28 DIAGNOSIS — F329 Major depressive disorder, single episode, unspecified: Secondary | ICD-10-CM | POA: Diagnosis not present

## 2019-10-28 DIAGNOSIS — Z880 Allergy status to penicillin: Secondary | ICD-10-CM | POA: Insufficient documentation

## 2019-10-28 DIAGNOSIS — K589 Irritable bowel syndrome without diarrhea: Secondary | ICD-10-CM | POA: Insufficient documentation

## 2019-10-28 DIAGNOSIS — Z7982 Long term (current) use of aspirin: Secondary | ICD-10-CM | POA: Insufficient documentation

## 2019-10-28 DIAGNOSIS — E663 Overweight: Secondary | ICD-10-CM | POA: Insufficient documentation

## 2019-10-28 DIAGNOSIS — I2510 Atherosclerotic heart disease of native coronary artery without angina pectoris: Secondary | ICD-10-CM

## 2019-10-28 HISTORY — PX: CORONARY STENT INTERVENTION: CATH118234

## 2019-10-28 HISTORY — PX: LEFT HEART CATH AND CORONARY ANGIOGRAPHY: CATH118249

## 2019-10-28 LAB — POCT ACTIVATED CLOTTING TIME: Activated Clotting Time: 274 seconds

## 2019-10-28 SURGERY — LEFT HEART CATH AND CORONARY ANGIOGRAPHY
Anesthesia: LOCAL

## 2019-10-28 MED ORDER — VERAPAMIL HCL 2.5 MG/ML IV SOLN
INTRAVENOUS | Status: AC
  Filled 2019-10-28: qty 2

## 2019-10-28 MED ORDER — ACETAMINOPHEN 325 MG PO TABS
650.0000 mg | ORAL_TABLET | ORAL | Status: DC | PRN
  Administered 2019-10-28: 650 mg via ORAL
  Filled 2019-10-28: qty 2

## 2019-10-28 MED ORDER — HEPARIN (PORCINE) IN NACL 1000-0.9 UT/500ML-% IV SOLN
INTRAVENOUS | Status: AC
  Filled 2019-10-28: qty 1000

## 2019-10-28 MED ORDER — CLOPIDOGREL BISULFATE 300 MG PO TABS
ORAL_TABLET | ORAL | Status: AC
  Filled 2019-10-28: qty 2

## 2019-10-28 MED ORDER — HEPARIN SODIUM (PORCINE) 1000 UNIT/ML IJ SOLN
INTRAMUSCULAR | Status: DC | PRN
  Administered 2019-10-28: 4000 [IU] via INTRAVENOUS
  Administered 2019-10-28: 6000 [IU] via INTRAVENOUS
  Administered 2019-10-28: 3000 [IU] via INTRAVENOUS

## 2019-10-28 MED ORDER — LABETALOL HCL 5 MG/ML IV SOLN: 10.0000 mg | INTRAVENOUS | Status: AC | PRN

## 2019-10-28 MED ORDER — ASPIRIN 81 MG PO CHEW: 81.0000 mg | CHEWABLE_TABLET | ORAL | Status: DC

## 2019-10-28 MED ORDER — FENTANYL CITRATE (PF) 100 MCG/2ML IJ SOLN
INTRAMUSCULAR | Status: DC | PRN
  Administered 2019-10-28 (×2): 50 ug via INTRAVENOUS

## 2019-10-28 MED ORDER — MIDAZOLAM HCL 2 MG/2ML IJ SOLN
INTRAMUSCULAR | Status: DC | PRN
  Administered 2019-10-28: 2 mg via INTRAVENOUS

## 2019-10-28 MED ORDER — ONDANSETRON HCL 4 MG/2ML IJ SOLN: 4.0000 mg | Freq: Four times a day (QID) | INTRAMUSCULAR | Status: DC | PRN

## 2019-10-28 MED ORDER — SODIUM CHLORIDE 0.9% FLUSH: 3.0000 mL | Freq: Two times a day (BID) | INTRAVENOUS | Status: DC

## 2019-10-28 MED ORDER — FENTANYL CITRATE (PF) 100 MCG/2ML IJ SOLN
INTRAMUSCULAR | Status: AC
  Filled 2019-10-28: qty 2

## 2019-10-28 MED ORDER — HEPARIN (PORCINE) IN NACL 1000-0.9 UT/500ML-% IV SOLN
INTRAVENOUS | Status: DC | PRN
  Administered 2019-10-28 (×2): 500 mL

## 2019-10-28 MED ORDER — SODIUM CHLORIDE 0.9 % IV SOLN: INTRAVENOUS | Status: AC

## 2019-10-28 MED ORDER — SODIUM CHLORIDE 0.9% FLUSH: 3.0000 mL | INTRAVENOUS | Status: DC | PRN

## 2019-10-28 MED ORDER — IOHEXOL 350 MG/ML SOLN
INTRAVENOUS | Status: DC | PRN
  Administered 2019-10-28: 100 mL

## 2019-10-28 MED ORDER — LIDOCAINE HCL (PF) 1 % IJ SOLN
INTRAMUSCULAR | Status: DC | PRN
  Administered 2019-10-28: 3 mL

## 2019-10-28 MED ORDER — HYDRALAZINE HCL 20 MG/ML IJ SOLN: 10.0000 mg | INTRAMUSCULAR | Status: AC | PRN

## 2019-10-28 MED ORDER — SODIUM CHLORIDE 0.9 % WEIGHT BASED INFUSION
3.0000 mL/kg/h | INTRAVENOUS | Status: AC
  Administered 2019-10-28: 3 mL/kg/h via INTRAVENOUS

## 2019-10-28 MED ORDER — FAMOTIDINE IN NACL 20-0.9 MG/50ML-% IV SOLN
INTRAVENOUS | Status: AC
  Filled 2019-10-28: qty 50

## 2019-10-28 MED ORDER — MIDAZOLAM HCL 2 MG/2ML IJ SOLN
INTRAMUSCULAR | Status: AC
  Filled 2019-10-28: qty 2

## 2019-10-28 MED ORDER — FAMOTIDINE IN NACL 20-0.9 MG/50ML-% IV SOLN
INTRAVENOUS | Status: DC | PRN
  Administered 2019-10-28: 20 mg via INTRAVENOUS

## 2019-10-28 MED ORDER — VERAPAMIL HCL 2.5 MG/ML IV SOLN
INTRAVENOUS | Status: DC | PRN
  Administered 2019-10-28: 10 mL via INTRA_ARTERIAL

## 2019-10-28 MED ORDER — PANTOPRAZOLE SODIUM 40 MG PO TBEC
40.0000 mg | DELAYED_RELEASE_TABLET | Freq: Two times a day (BID) | ORAL | 11 refills | Status: DC
Start: 2019-10-28 — End: 2021-05-30

## 2019-10-28 MED ORDER — EZETIMIBE 10 MG PO TABS
10.0000 mg | ORAL_TABLET | Freq: Every day | ORAL | 11 refills | Status: DC
Start: 2019-10-28 — End: 2019-11-21

## 2019-10-28 MED ORDER — HEPARIN SODIUM (PORCINE) 1000 UNIT/ML IJ SOLN
INTRAMUSCULAR | Status: AC
  Filled 2019-10-28: qty 1

## 2019-10-28 MED ORDER — CLOPIDOGREL BISULFATE 300 MG PO TABS
ORAL_TABLET | ORAL | Status: DC | PRN
  Administered 2019-10-28: 600 mg via ORAL

## 2019-10-28 MED ORDER — LIDOCAINE HCL (PF) 1 % IJ SOLN
INTRAMUSCULAR | Status: AC
  Filled 2019-10-28: qty 30

## 2019-10-28 MED ORDER — SODIUM CHLORIDE 0.9 % IV SOLN: 250.0000 mL | INTRAVENOUS | Status: DC | PRN

## 2019-10-28 MED ORDER — SODIUM CHLORIDE 0.9 % WEIGHT BASED INFUSION: 1.0000 mL/kg/h | INTRAVENOUS | Status: DC

## 2019-10-28 MED ORDER — CLOPIDOGREL BISULFATE 75 MG PO TABS: 75.0000 mg | ORAL_TABLET | Freq: Every day | ORAL | Status: DC

## 2019-10-28 MED ORDER — CLOPIDOGREL BISULFATE 75 MG PO TABS: 75.0000 mg | ORAL_TABLET | Freq: Every day | ORAL | 11 refills | Status: DC

## 2019-10-28 MED FILL — EZETIMIBE 10 MG TABS: 10 | 30 days supply | Qty: 30 | Fill #0

## 2019-10-28 MED FILL — CLOPIDOGREL 75 MG TABLET: 75 | 30 days supply | Qty: 30 | Fill #0

## 2019-10-28 MED FILL — PANTOPRAZOLE SOD DR 40 MG T: 40 | 30 days supply | Qty: 60 | Fill #0

## 2019-10-28 SURGICAL SUPPLY — 18 items
BALLN SAPPHIRE 2.0X12 (BALLOONS) ×2
BALLN SAPPHIRE ~~LOC~~ 3.5X15 (BALLOONS) ×2 IMPLANT
BALLOON SAPPHIRE 2.0X12 (BALLOONS) ×1 IMPLANT
CATH 5FR JL3.5 JR4 ANG PIG MP (CATHETERS) ×2 IMPLANT
CATH LAUNCHER 6FR JR4 (CATHETERS) ×2 IMPLANT
DEVICE RAD COMP TR BAND LRG (VASCULAR PRODUCTS) ×2 IMPLANT
GLIDESHEATH SLEND SS 6F .021 (SHEATH) ×2 IMPLANT
GUIDEWIRE INQWIRE 1.5J.035X260 (WIRE) ×1 IMPLANT
INQWIRE 1.5J .035X260CM (WIRE) ×2
KIT ENCORE 26 ADVANTAGE (KITS) ×2 IMPLANT
KIT HEART LEFT (KITS) ×2 IMPLANT
PACK CARDIAC CATHETERIZATION (CUSTOM PROCEDURE TRAY) ×2 IMPLANT
STENT SYNERGY XD 3.0X20 (Permanent Stent) ×1 IMPLANT
SYNERGY XD 3.0X20 (Permanent Stent) ×2 IMPLANT
SYR MEDRAD MARK V 150ML (SYRINGE) ×2 IMPLANT
TRANSDUCER W/STOPCOCK (MISCELLANEOUS) ×2 IMPLANT
TUBING CIL FLEX 10 FLL-RA (TUBING) ×2 IMPLANT
WIRE COUGAR XT STRL 190CM (WIRE) ×2 IMPLANT

## 2019-10-28 NOTE — Progress Notes (Signed)
error 

## 2019-10-28 NOTE — Discharge Summary (Signed)
Discharge Summary for Same Day PCI   Patient ID: Angela [name] MRN: [id number]; DOB: 11/23/1945  Admit date: 10/28/2019 Discharge date: 10/28/2019  Primary Care Provider: [name], MD  Primary Cardiologist: Ena Dawley, MD  Primary Electrophysiologist:  None   Discharge Diagnoses    Principal Problem:   Unstable angina [facility]) Active Problems:   Hyperlipidemia   Accelerated hypertension   Diagnostic Studies/Procedures    Cardiac Catheterization 10/28/2019:   Colon Flattery 1st Diag lesion is 70% stenosed.  Prox RCA lesion is 95% stenosed.  A drug-eluting stent was successfully placed using a SYNERGY XD 3.0X20.  Post intervention, there is a 0% residual stenosis.  Prox Cx lesion is 20% stenosed.  Mid Cx to Dist Cx lesion is 20% stenosed.  Prox LAD lesion is 60% stenosed.  Mid LAD lesion is 50% stenosed.  The left ventricular systolic function is normal.  LV end diastolic pressure is normal.  The left ventricular ejection fraction is greater than 65% by visual estimate.  There is no mitral valve regurgitation.   1. Severe stenosis mid RCA 2. Successful PTCA/DES mid RCA 3. Moderate proximal and mid LAD stenosis, unchanged from last cath in 2017. Moderately severe stenosis in the ostium of the small caliber Diagonal branch.  4. Hyperdynamic LV systolic function  Recommendations: DAPT with ASA and Plavix for at least six months. Continue statin and beta blocker. Same day post PCI discharge if stable.   Diagnostic Dominance: Right  Intervention    _____________   History of Present Illness     Angela [name] is a 74 y.o. female with [place] of nonobstructive disease on cath in 0000000, diastolic HF, HTN, HL who was recently seen in the office on 5/4 with Richardson Dopp, PA-C. She had felt fatigued and had noted dyspnea with exertion over the past few months.  It seemed to be getting worse, with less and less activity.  She had some chest pressure.  She had the  symptoms of chest pressure since her diagnosis of CAD.  Her antianginal therapy had improved her symptoms in the past.  However, recently, her symptoms had worsened.  She had to stop intercourse due to severe chest pain.  She had some radiation to her interscapular area.  She had not had any associated nausea or syncope.  She had some mild diaphoresis. Her case was discussed with Dr. Acie Fredrickson and outpatient cardiac catheterization was arranged for further evaluation. Her Imdur was increased to 60mg  daily at this visit.   Hospital Course     The patient underwent cardiac cath as noted above with successful PCI/DES to [facility]. Also noted to have moderate p/mLAD lesion which was unchanged from cath back in 2017. Plan for DAPT with ASA/Plavix for at least 6 months. The patient was seen by cardiac rehab while in short stay. There were no observed complications post cath. Radial cath site was re-evaluated prior to discharge and found to be stable without any complications. Of note, she has had issues with statins and myalgias. Agreeable to start Zetia at discharge.  Deveron Furlong was seen by Dr. Angelena Form and determined stable for discharge home. Follow up with our office has been arranged. Instructions/precautions regarding cath site care were given prior to discharge. Medications are listed below. Pertinent changes include addition of plavix, zetia and protonix.  _____________  Did the patient have an acute coronary syndrome (MI, NSTEMI, STEMI, etc) this admission?:  No  Did the patient have a percutaneous coronary intervention (stent / angioplasty)?:  Yes.     Cath/PCI Registry Performance & Quality Measures: 1. Aspirin prescribed? - Yes 2. ADP Receptor Inhibitor (Plavix/Clopidogrel, Brilinta/Ticagrelor or Effient/Prasugrel) prescribed (includes medically managed patients)? - Yes 3. High Intensity Statin (Lipitor 40-80mg  or Crestor 20-40mg ) prescribed? - No - myalgias, sent for  lipid clinic referral 4. For EF <40%, was ACEI/ARB prescribed? - Not Applicable (EF >/= [id number]) 5. For EF <40%, Aldosterone Antagonist (Spironolactone or Eplerenone) prescribed? - Not Applicable (EF >/= [id number]) 6. Cardiac Rehab Phase II ordered? - Yes   _____________   Discharge Vitals Blood pressure (!) 149/87, pulse 61, temperature (!) 97.1 F (36.2 C), temperature source Skin, resp. rate 18, height 5\' 6"  (1.676 m), weight 79.2 kg, SpO2 96 %.  Filed Weights   10/28/19 0555  Weight: 79.2 kg    Last Labs & Radiologic Studies    CBC No results for input(s): WBC, NEUTROABS, HGB, HCT, MCV, PLT in the last 72 hours. Basic Metabolic Panel Recent Labs    10/26/19 0930  NA 144  K 4.1  CL 105  CO2 30*  GLUCOSE 124*  BUN 16  CREATININE 1.07*  CALCIUM 9.6   Liver Function Tests No results for input(s): AST, ALT, ALKPHOS, BILITOT, PROT, ALBUMIN in the last 72 hours. No results for input(s): LIPASE, AMYLASE in the last 72 hours. High Sensitivity Troponin:   No results for input(s): TROPONINIHS in the last 720 hours.  BNP Invalid input(s): POCBNP D-Dimer No results for input(s): DDIMER in the last 72 hours. Hemoglobin A1C No results for input(s): HGBA1C in the last 72 hours. Fasting Lipid Panel No results for input(s): CHOL, HDL, LDLCALC, TRIG, CHOLHDL, LDLDIRECT in the last 72 hours. Thyroid Function Tests No results for input(s): TSH, T4TOTAL, T3FREE, THYROIDAB in the last 72 hours.  Invalid input(s): FREET3 _____________  CARDIAC CATHETERIZATION  Result Date: 10/28/2019  Ost 1st Diag lesion is 70% stenosed.  Prox RCA lesion is 95% stenosed.  A drug-eluting stent was successfully placed using a SYNERGY XD 3.0X20.  Post intervention, there is a 0% residual stenosis.  Prox Cx lesion is 20% stenosed.  Mid Cx to Dist Cx lesion is 20% stenosed.  Prox LAD lesion is 60% stenosed.  Mid LAD lesion is 50% stenosed.  The left ventricular systolic function is normal.  LV end  diastolic pressure is normal.  The left ventricular ejection fraction is greater than 65% by visual estimate.  There is no mitral valve regurgitation.  1. Severe stenosis mid RCA 2. Successful PTCA/DES mid RCA 3. Moderate proximal and mid LAD stenosis, unchanged from last cath in 2017. Moderately severe stenosis in the ostium of the small caliber Diagonal branch. 4. Hyperdynamic LV systolic function Recommendations: DAPT with ASA and Plavix for at least six months. Continue statin and beta blocker. Same day post PCI discharge if stable.    Disposition   Pt is being discharged home today in good condition.  Follow-up Plans & Appointments    Follow-up Information    Larey Dresser, MD Follow up on 11/08/2019.   Specialty: Cardiology Why: at 10am for your follow up appt Contact information: [address] [place] [address] [phone]          Discharge Instructions    AMB Referral to [facility]   Complete by: As directed    Reason for referral: Follow-up patients for medication management   Provider to see patient: PharmD  Internal Lipid Clinic Referral Scheduling  Internal lipid clinic referrals are providers within [facility], who wish to refer established patients for routine management (help in starting PCSK9 inhibitor therapy) or advanced therapies.  Internal MD referral criteria:              1. All patients with LDL>190 mg/dL  2. All patients with Triglycerides >500 mg/dL  3. Patients with suspected or confirmed heterozygous familial hyperlipidemia (HeFH) or homozygous familial hyperlipidemia (HoFH)  4. Patients with family history of suspicious for genetic dyslipidemia desiring genetic testing  5. Patients refractory to standard guideline based therapy  6. Patients with statin intolerance (failed 2 statins, one of which must be a high potency statin)  7. Patients who the provider desires to be seen by MD   Internal PharmD referral  criteria:   1. Follow-up patients for medication management  2. Follow-up for compliance monitoring  3. Patients for drug education  4. Patients with statin intolerance  5. PCSK9 inhibitor education and prior authorization approvals  6. Patients with triglycerides <500 mg/dL  External Lipid Clinic Referral  External lipid clinic referrals are for providers outside of [facility], considered new clinic patients - automatically routed to MD schedule   Diet - low sodium heart healthy   Complete by: As directed    Increase activity slowly   Complete by: As directed        Discharge Medications   Allergies as of 10/28/2019      Reactions   Norvasc [amlodipine Besylate] Swelling   Phenergan [promethazine] Other (See Comments)   Restlessness   Timentin [ticarcillin-pot Clavulanate] Anaphylaxis   Has patient had a PCN reaction causing immediate rash, facial/tongue/throat swelling, SOB or lightheadedness with hypotension: Yes Has patient had a PCN reaction causing severe rash involving mucus membranes or skin necrosis: Yes Has patient had a PCN reaction that required hospitalization Yes Has patient had a PCN reaction occurring within the last 10 years: No If all of the above answers are "NO", then may proceed with Cephalosporin use.   Pregabalin Other (See Comments)   Mouth sores   Reglan [metoclopramide] Other (See Comments)   Cannot sit still      Medication List    STOP taking these medications   esomeprazole 40 MG capsule Commonly known as: NEXIUM     TAKE these medications   aspirin EC 81 MG tablet Take 81 mg by mouth daily.   B-12 2000 MCG Tabs Take 2,000 mcg by mouth daily.   clopidogrel 75 MG tablet Commonly known as: PLAVIX Take 1 tablet (75 mg total) by mouth daily with breakfast. Start taking on: Oct 29, 2019   DULoxetine 20 MG capsule Commonly known as: CYMBALTA Take 20 mg by mouth 2 (two) times daily.   escitalopram 10 MG tablet Commonly known as:  LEXAPRO Take 10 mg by mouth daily.   ezetimibe 10 MG tablet Commonly known as: Zetia Take 1 tablet (10 mg total) by mouth daily.   fluticasone 50 MCG/ACT nasal spray Commonly known as: FLONASE Place 2 sprays into both nostrils daily as needed for allergies (seasonal allergies).   furosemide 20 MG tablet Commonly known as: LASIX Take 1 tablet by mouth once a day for 3 days, then take as needed for leg swelling or weight gain of 3 pounds or more in 1 day What changed:   how much to take  how to take this  when to take this  additional instructions   isosorbide mononitrate 60 MG  24 hr tablet Commonly known as: IMDUR Take 1 tablet (60 mg total) by mouth daily.   levorphanol 2 MG tablet Commonly known as: LEVODROMORAN Take 2 mg by mouth 3 (three) times daily.   LORazepam 2 MG tablet Commonly known as: ATIVAN Take 2 mg by mouth at bedtime.   lubiprostone 24 MCG capsule Commonly known as: AMITIZA Take 24 mcg by mouth 2 (two) times daily with a meal.   metoprolol tartrate 100 MG tablet Commonly known as: LOPRESSOR Take 100 mg by mouth 2 (two) times daily.   nitroGLYCERIN 0.4 MG SL tablet Commonly known as: NITROSTAT Place 0.4 mg under the tongue every 5 (five) minutes as needed for chest pain.   ondansetron 4 MG tablet Commonly known as: ZOFRAN Take 4 mg by mouth every 8 (eight) hours as needed for nausea or vomiting.   pantoprazole 40 MG tablet Commonly known as: Protonix Take 1 tablet (40 mg total) by mouth 2 (two) times daily.   rOPINIRole 1 MG tablet Commonly known as: REQUIP Take 1-2 mg by mouth See admin instructions. Take 2 tablets (2 mg) by mouth daily at bedtime, may also take 1 tablet (1 mg) in the afternoon 15:00 for restless legs   rosuvastatin 10 MG tablet Commonly known as: CRESTOR Take 1 tablet (10 mg total) by mouth daily. What changed: when to take this   Savella 100 MG Tabs tablet Generic drug: Milnacipran HCl Take 100 mg by mouth 2 (two)  times daily.   tiZANidine 4 MG tablet Commonly known as: ZANAFLEX Take 4 mg by mouth every 8 (eight) hours as needed for muscle spasms.          Allergies Allergies  Allergen Reactions  . Norvasc [Amlodipine Besylate] Swelling  . Phenergan [Promethazine] Other (See Comments)    Restlessness   . Timentin [Ticarcillin-Pot Clavulanate] Anaphylaxis    Has patient had a PCN reaction causing immediate rash, facial/tongue/throat swelling, SOB or lightheadedness with hypotension: Yes Has patient had a PCN reaction causing severe rash involving mucus membranes or skin necrosis: Yes Has patient had a PCN reaction that required hospitalization Yes Has patient had a PCN reaction occurring within the last 10 years: No If all of the above answers are "NO", then may proceed with Cephalosporin use.   . Pregabalin Other (See Comments)    Mouth sores  . Reglan [Metoclopramide] Other (See Comments)    Cannot sit still    Outstanding Labs/Studies   Outpt referral to lipid clinic at discharge  Duration of Discharge Encounter   Greater than 30 minutes including physician time.  Signed, Reino Bellis, NP 10/28/2019, 10:48 AM

## 2019-10-28 NOTE — Discharge Instructions (Signed)
DRINK PLENTY OF FLUIDS OVER THE NEXT 2-3 DAYS. Radial Site Care  This sheet gives you information about how to care for yourself after your procedure. Your health care provider may also give you more specific instructions. If you have problems or questions, contact your health care provider. What can I expect after the procedure? After the procedure, it is common to have:  Bruising and tenderness at the catheter insertion area. Follow these instructions at home: Medicines  Take over-the-counter and prescription medicines only as told by your health care provider. Insertion site care  Follow instructions from your health care provider about how to take care of your insertion site. Make sure you: ? Wash your hands with soap and water before you change your bandage (dressing). If soap and water are not available, use hand sanitizer. ? Change your dressing as told by your health care provider. ? Leave stitches (sutures), skin glue, or adhesive strips in place. These skin closures may need to stay in place for 2 weeks or longer. If adhesive strip edges start to loosen and curl up, you may trim the loose edges. Do not remove adhesive strips completely unless your health care provider tells you to do that.  Check your insertion site every day for signs of infection. Check for: ? Redness, swelling, or pain. ? Fluid or blood. ? Pus or a bad smell. ? Warmth.  Do not take baths, swim, or use a hot tub until your health care provider approves.  You may shower 24-48 hours after the procedure, or as directed by your health care provider. ? Remove the dressing and gently wash the site with plain soap and water. ? Pat the area dry with a clean towel. ? Do not rub the site. That could cause bleeding.  Do not apply powder or lotion to the site. Activity   For 24 hours after the procedure, or as directed by your health care provider: ? Do not flex or bend the affected arm. ? Do not push or pull  heavy objects with the affected arm. ? Do not drive yourself home from the hospital or clinic. You may drive 24 hours after the procedure unless your health care provider tells you not to. ? Do not operate machinery or power tools.  Do not lift anything that is heavier than 10 lb (4.5 kg), or the limit that you are told, until your health care provider says that it is safe.  Ask your health care provider when it is okay to: ? Return to work or school. ? Resume usual physical activities or sports. ? Resume sexual activity. General instructions  If the catheter site starts to bleed, raise your arm and put firm pressure on the site. If the bleeding does not stop, get help right away. This is a medical emergency.  If you went home on the same day as your procedure, a responsible adult should be with you for the first 24 hours after you arrive home.  Keep all follow-up visits as told by your health care provider. This is important. Contact a health care provider if:  You have a fever.  You have redness, swelling, or yellow drainage around your insertion site. Get help right away if:  You have unusual pain at the radial site.  The catheter insertion area swells very fast.  The insertion area is bleeding, and the bleeding does not stop when you hold steady pressure on the area.  Your arm or hand becomes pale, cool, tingly,   or numb. These symptoms may represent a serious problem that is an emergency. Do not wait to see if the symptoms will go away. Get medical help right away. Call your local emergency services (911 in the [place] Do not drive yourself to the hospital. Summary  After the procedure, it is common to have bruising and tenderness at the site.  Follow instructions from your health care provider about how to take care of your radial site wound. Check the wound every day for signs of infection.  Do not lift anything that is heavier than 10 lb (4.5 kg), or the limit that you are  told, until your health care provider says that it is safe. This information is not intended to replace advice given to you by your health care provider. Make sure you discuss any questions you have with your health care provider. Document Revised: 06/24/2017 Document Reviewed: 06/24/2017 Elsevier Patient Education  2020 Elsevier Inc.  

## 2019-10-28 NOTE — Interval H&P Note (Signed)
History and Physical Interval Note:  10/28/2019 7:17 AM  Angela [name]  has presented today for surgery, with the diagnosis of cad - angina.  The various methods of treatment have been discussed with the patient and family. After consideration of risks, benefits and other options for treatment, the patient has consented to  Procedure(s): LEFT HEART CATH AND CORONARY ANGIOGRAPHY ([name]) as a surgical intervention.  The patient's history has been reviewed, patient examined, no change in status, stable for surgery.  I have reviewed the patient's chart and labs.  Questions were answered to the patient's satisfaction.    Cath Lab Visit (complete for each Cath Lab visit)  Clinical Evaluation Leading to the Procedure:   ACS: No.  Non-ACS:    Anginal Classification: CCS II  Anti-ischemic medical therapy: Maximal Therapy (2 or more classes of medications)  Non-Invasive Test Results: No non-invasive testing performed  Prior CABG: No previous CABG        Angela [name]

## 2019-10-28 NOTE — Progress Notes (Signed)
1045-1122 Education completed with pt who voiced understanding. Stressed importance of plavix with stent. Reviewed NTG use, heart healthy food choices, walking for ex, and CRP 2. Referral to [place] [facility] 2.  Graylon Good RN BSN 10/28/2019 11:22 AM

## 2019-11-01 ENCOUNTER — Telehealth (HOSPITAL_COMMUNITY): Payer: Self-pay

## 2019-11-01 NOTE — Telephone Encounter (Signed)
Faxed referral to [facility] for Cardiac Rehab.

## 2019-11-08 ENCOUNTER — Encounter (HOSPITAL_COMMUNITY): Payer: Self-pay | Admitting: Cardiology

## 2019-11-08 ENCOUNTER — Ambulatory Visit (HOSPITAL_COMMUNITY)
Admission: RE | Admit: 2019-11-08 | Discharge: 2019-11-08 | Disposition: A | Discharge status: Home / Self Care | Payer: Medicare Other | Source: Ambulatory Visit | Attending: Cardiology | Admitting: Cardiology

## 2019-11-08 ENCOUNTER — Other Ambulatory Visit: Payer: Self-pay

## 2019-11-08 VITALS — BP 100/60 | HR 86 | Wt 175.4 lb

## 2019-11-08 DIAGNOSIS — K589 Irritable bowel syndrome without diarrhea: Secondary | ICD-10-CM | POA: Diagnosis not present

## 2019-11-08 DIAGNOSIS — Z85038 Personal history of other malignant neoplasm of large intestine: Secondary | ICD-10-CM | POA: Diagnosis not present

## 2019-11-08 DIAGNOSIS — G2581 Restless legs syndrome: Secondary | ICD-10-CM | POA: Diagnosis not present

## 2019-11-08 DIAGNOSIS — F419 Anxiety disorder, unspecified: Secondary | ICD-10-CM | POA: Diagnosis not present

## 2019-11-08 DIAGNOSIS — E785 Hyperlipidemia, unspecified: Secondary | ICD-10-CM | POA: Diagnosis not present

## 2019-11-08 DIAGNOSIS — F329 Major depressive disorder, single episode, unspecified: Secondary | ICD-10-CM | POA: Diagnosis not present

## 2019-11-08 DIAGNOSIS — E782 Mixed hyperlipidemia: Secondary | ICD-10-CM | POA: Diagnosis not present

## 2019-11-08 DIAGNOSIS — Z7982 Long term (current) use of aspirin: Secondary | ICD-10-CM | POA: Diagnosis not present

## 2019-11-08 DIAGNOSIS — Z79899 Other long term (current) drug therapy: Secondary | ICD-10-CM | POA: Insufficient documentation

## 2019-11-08 DIAGNOSIS — I5032 Chronic diastolic (congestive) heart failure: Secondary | ICD-10-CM | POA: Insufficient documentation

## 2019-11-08 DIAGNOSIS — Z880 Allergy status to penicillin: Secondary | ICD-10-CM | POA: Diagnosis not present

## 2019-11-08 DIAGNOSIS — Z8249 Family history of ischemic heart disease and other diseases of the circulatory system: Secondary | ICD-10-CM | POA: Insufficient documentation

## 2019-11-08 DIAGNOSIS — Z7902 Long term (current) use of antithrombotics/antiplatelets: Secondary | ICD-10-CM | POA: Diagnosis not present

## 2019-11-08 DIAGNOSIS — E663 Overweight: Secondary | ICD-10-CM | POA: Diagnosis not present

## 2019-11-08 DIAGNOSIS — Z888 Allergy status to other drugs, medicaments and biological substances status: Secondary | ICD-10-CM | POA: Diagnosis not present

## 2019-11-08 DIAGNOSIS — I25110 Atherosclerotic heart disease of native coronary artery with unstable angina pectoris: Secondary | ICD-10-CM | POA: Diagnosis present

## 2019-11-08 DIAGNOSIS — I2510 Atherosclerotic heart disease of native coronary artery without angina pectoris: Secondary | ICD-10-CM | POA: Insufficient documentation

## 2019-11-08 DIAGNOSIS — M797 Fibromyalgia: Secondary | ICD-10-CM | POA: Diagnosis not present

## 2019-11-08 DIAGNOSIS — K3184 Gastroparesis: Secondary | ICD-10-CM | POA: Diagnosis not present

## 2019-11-08 DIAGNOSIS — I110 Hypertensive heart disease with heart failure: Secondary | ICD-10-CM | POA: Diagnosis not present

## 2019-11-08 DIAGNOSIS — K219 Gastro-esophageal reflux disease without esophagitis: Secondary | ICD-10-CM | POA: Diagnosis not present

## 2019-11-08 DIAGNOSIS — I25119 Atherosclerotic heart disease of native coronary artery with unspecified angina pectoris: Secondary | ICD-10-CM

## 2019-11-08 DIAGNOSIS — Z6828 Body mass index (BMI) 28.0-28.9, adult: Secondary | ICD-10-CM | POA: Diagnosis not present

## 2019-11-08 MED ORDER — ISOSORBIDE MONONITRATE ER 30 MG PO TB24
30.0000 mg | ORAL_TABLET | Freq: Every day | ORAL | 3 refills | Status: DC
Start: 2019-11-08 — End: 2020-07-19

## 2019-11-08 MED ORDER — FUROSEMIDE 20 MG PO TABS: 20.0000 mg | ORAL_TABLET | Freq: Every day | ORAL | 6 refills | Status: DC

## 2019-11-08 MED ORDER — POTASSIUM CHLORIDE ER 10 MEQ PO TBCR
10.0000 meq | EXTENDED_RELEASE_TABLET | Freq: Every day | ORAL | 3 refills | Status: DC
Start: 2019-11-08 — End: 2019-11-21

## 2019-11-08 MED ORDER — METOPROLOL SUCCINATE ER 50 MG PO TB24
ORAL_TABLET | ORAL | 3 refills | Status: DC
Start: 2019-11-08 — End: 2019-12-20

## 2019-11-08 NOTE — Patient Instructions (Signed)
STOP Metoprolol Tartrate  Start Metoprolol Succinate 100 mg (2 tabs) in AM and 50 mg (1 tab) in PM  Increase Furosemide to 20 mg Daily  Start Potassium 10 meq Daily  Decrease Isosorbide to 30 mg Daily  Labs needed in 10-14 days  Cholesterol check needed in 4-6 weeks  Your physician has requested that you have an echocardiogram. Echocardiography is a painless test that uses sound waves to create images of your heart. It provides your doctor with information about the size and shape of your heart and how well your heart's chambers and valves are working. This procedure takes approximately one hour. There are no restrictions for this procedure.  You have been referred to Cardiac Rehab, they will call you to schedule this  Your physician recommends that you schedule a follow-up appointment in: 1 month  If you have any questions or concerns before your next appointment please send [place] a message through [place] or call our office at [phone].    TO LEAVE A MESSAGE FOR THE NURSE SELECT OPTION 2, PLEASE LEAVE A MESSAGE INCLUDING: . YOUR NAME . DATE OF BIRTH . CALL BACK NUMBER . REASON FOR CALL**this is important as we prioritize the call backs  Angela [name] AS LONG AS YOU CALL BEFORE 4:00 PM  At the [facility], you and your health needs are our priority. As part of our continuing mission to provide you with exceptional heart care, we have created designated Provider Care Teams. These Care Teams include your primary Cardiologist (physician) and Advanced Practice Providers (APPs- Physician Assistants and Nurse Practitioners) who all work together to provide you with the care you need, when you need it.   You may see any of the following providers on your designated Care Team at your next follow up: Marland Kitchen Dr Glori Bickers . Dr Loralie Champagne . Darrick Grinder, NP . [name], PA . Audry Riles, PharmD   Please be sure to bring in all your  medications bottles to every appointment.

## 2019-11-08 NOTE — Progress Notes (Signed)
PCP: [name], MD Cardiology: Dr. [name] HF Cardiology: Dr. Aundra Dubin  74 y.o. with history of CAD, diastolic CHF, and exertional dyspnea was self-referred to CHF clinic.  I take care of her husband, Anntionette Madkins.  She has struggled with exertional dyspnea for the last several months.  Last echo in 1/19 showed vigorous LV systolic function with a mild intracavitary gradient.  Given exertional dyspnea and chest pressure, she had LHC in 5/21, showing 95% pRCA stenosis that was treated with DES.   Despite PCI, she does not feel any better.  She still has exertional dyspnea and chest pressure.  Her chest pressure is nonexertional and happens at random.  She still gets short of breath walking 200 feet or walking up stairs.  No orthopnea/PND.  No lightheadedness.  No palpitations. She gets lightheaded if she stands up too fast. After cath, Imdur was increased.  This has not helped her symptoms any. She never smoked.  She does not take Lasix.   ECG (personally reviewed): NSR, LAFB, LVH  Labs (5/21): K 4.1, creatinine 1.07. NT-proBNP 721  PMH:  1. Diastolic CHF: Echo in 5/36 with EF 65-70%, mild LV intracavitary gradient, mild LVH, very mild AS, RV size and systolic function normal.   2. Hyperlipidemia; Myalgias with atorvastatin, tolerates low dose Crestor.  3. CAD: LHC in 2017 with moderate nonobstructive disease.  - LHC (5/21): 70% D1, 95% pRCA, 60% mLAD, 60% dLAD. DES to RCA.  4. HTN: Edema with amlodipine.  5. Gastroparesis  ROS: All systems reviewed and negative except as per HPI.   Social History   Socioeconomic History  . Marital status: Married    Spouse name: Not on file  . Number of children: Not on file  . Years of education: Not on file  . Highest education level: Not on file  Occupational History  . Not on file  Tobacco Use  . Smoking status: Never Smoker  . Smokeless tobacco: Never Used  Substance and Sexual Activity  . Alcohol use: No    Alcohol/week: 0.0 standard  drinks  . Drug use: No  . Sexual activity: Not on file  Other Topics Concern  . Not on file  Social History Narrative  . Not on file   Social Determinants of Health   Financial Resource Strain:   . Difficulty of Paying Living Expenses:   Food Insecurity:   . Worried About Charity fundraiser in the Last Year:   . Arboriculturist in the Last Year:   Transportation Needs:   . Film/video editor (Medical):   Marland Kitchen Lack of Transportation (Non-Medical):   Physical Activity:   . Days of Exercise per Week:   . Minutes of Exercise per Session:   Stress:   . Feeling of Stress :   Social Connections:   . Frequency of Communication with Friends and Family:   . Frequency of Social Gatherings with Friends and Family:   . Attends Religious Services:   . Active Member of Clubs or Organizations:   . Attends Archivist Meetings:   Marland Kitchen Marital Status:   Intimate Partner Violence:   . Fear of Current or Ex-Partner:   . Emotionally Abused:   Marland Kitchen Physically Abused:   . Sexually Abused:    Family History  Problem Relation Age of Onset  . Hypertension Mother   . Heart disease Mother   . Heart attack Father   . Stroke Father   . Diabetes Sister   .  Hypertension Sister   . Stroke Other   . Diabetes Other   . Heart disease Other    Current Outpatient Medications  Medication Sig Dispense Refill  . aspirin EC 81 MG tablet Take 81 mg by mouth daily.    . clopidogrel (PLAVIX) 75 MG tablet Take 1 tablet (75 mg total) by mouth daily with breakfast. 30 tablet 11  . Cyanocobalamin (B-12) 2000 MCG TABS Take 2,000 mcg by mouth daily.     . DULoxetine (CYMBALTA) 20 MG capsule Take 20 mg by mouth 2 (two) times daily.     Marland Kitchen escitalopram (LEXAPRO) 10 MG tablet Take 10 mg by mouth daily.    Marland Kitchen ezetimibe (ZETIA) 10 MG tablet Take 1 tablet (10 mg total) by mouth daily. 30 tablet 11  . fluticasone (FLONASE) 50 MCG/ACT nasal spray Place 2 sprays into both nostrils daily as needed for allergies  (seasonal allergies).     . furosemide (LASIX) 20 MG tablet Take 1 tablet (20 mg total) by mouth daily. 30 tablet 6  . isosorbide mononitrate (IMDUR) 30 MG 24 hr tablet Take 1 tablet (30 mg total) by mouth daily. 90 tablet 3  . levorphanol (LEVODROMORAN) 2 MG tablet Take 2 mg by mouth 3 (three) times daily.     Marland Kitchen LORazepam (ATIVAN) 2 MG tablet Take 2 mg by mouth at bedtime.     Marland Kitchen lubiprostone (AMITIZA) 24 MCG capsule Take 24 mcg by mouth 2 (two) times daily with a meal.     . Milnacipran HCl (SAVELLA) 100 MG TABS tablet Take 100 mg by mouth 2 (two) times daily.    . nitroGLYCERIN (NITROSTAT) 0.4 MG SL tablet Place 0.4 mg under the tongue every 5 (five) minutes as needed for chest pain.    Marland Kitchen ondansetron (ZOFRAN) 4 MG tablet Take 4 mg by mouth every 8 (eight) hours as needed for nausea or vomiting.    . pantoprazole (PROTONIX) 40 MG tablet Take 1 tablet (40 mg total) by mouth 2 (two) times daily. 30 tablet 11  . rOPINIRole (REQUIP) 1 MG tablet Take 1-2 mg by mouth See admin instructions. Take 2 tablets (2 mg) by mouth daily at bedtime, may also take 1 tablet (1 mg) in the afternoon 15:00 for restless legs    . rosuvastatin (CRESTOR) 10 MG tablet Take 10 mg by mouth at bedtime.    Marland Kitchen tiZANidine (ZANAFLEX) 4 MG tablet Take 4 mg by mouth every 8 (eight) hours as needed for muscle spasms.     . metoprolol succinate (TOPROL-XL) 50 MG 24 hr tablet Take 2 tablets (100 mg total) by mouth in the morning AND 1 tablet (50 mg total) every evening. Take with or immediately following a meal.. 90 tablet 3  . potassium chloride (KLOR-CON) 10 MEQ tablet Take 1 tablet (10 mEq total) by mouth daily. 30 tablet 3   No current facility-administered medications for this encounter.   BP 100/60   Pulse 86   Wt 79.6 kg (175 lb 6.4 oz)   SpO2 98%   BMI 28.31 kg/m  General: NAD Neck: No JVD, no thyromegaly or thyroid nodule.  Lungs: Clear to auscultation bilaterally with normal respiratory effort. CV: Nondisplaced PMI.   Heart regular S1/S2, no S3/S4, 1/6 SEM RUSB.  No peripheral edema.  No carotid bruit.  Normal pedal pulses.  Abdomen: Soft, nontender, no hepatosplenomegaly, no distention.  Skin: Intact without lesions or rashes.  Neurologic: Alert and oriented x 3.  Psych: Normal affect. Extremities: No clubbing or  cyanosis.  HEENT: Normal.   Assessment/Plan:  1. Chronic diastolic CHF: Last echo in 1/19 showed vigorous LV systolic function EF 12-81% with intracavity gradient.  She has NYHA class III symptoms.  On exam, however, at most mildly volume overloaded.  - I will stop metoprolol tartrate and start Toprol XL 100 qam/50 qpm. Hopefully this will help to relax his LV.  - Imdur has not helped symptoms, will decrease it back to 30 mg daily to give more BP room.  - Trial of Lasix 20 mg daily + KCl 10 mEq daily.  BMET today and in 10 days.  - I will arrange for echo to reassess LV and RV function.  - If symptoms do not improve with Lasix and Toprol XL, I will arrange for RHC and CPX.  2. Coronary artery disease: DES to RCA in 5/21.  - Continue ASA 81 and Plavix 75 daily.  - Continue Cresor 10 g daily and Zetia 10 mg daily.  Check lipids in 6 wks (recently started Zetia).  - I will refer her to cardiac rehab.   Followup in 1 month.   Loralie Champagne 11/08/2019

## 2019-11-10 ENCOUNTER — Ambulatory Visit: Payer: Medicare Other | Admitting: Physician Assistant

## 2019-11-14 DIAGNOSIS — I10 Essential (primary) hypertension: Secondary | ICD-10-CM | POA: Diagnosis not present

## 2019-11-14 DIAGNOSIS — G4700 Insomnia, unspecified: Secondary | ICD-10-CM | POA: Diagnosis not present

## 2019-11-14 DIAGNOSIS — R7303 Prediabetes: Secondary | ICD-10-CM | POA: Diagnosis not present

## 2019-11-14 DIAGNOSIS — E785 Hyperlipidemia, unspecified: Secondary | ICD-10-CM | POA: Diagnosis not present

## 2019-11-14 DIAGNOSIS — K219 Gastro-esophageal reflux disease without esophagitis: Secondary | ICD-10-CM | POA: Diagnosis not present

## 2019-11-14 DIAGNOSIS — I2510 Atherosclerotic heart disease of native coronary artery without angina pectoris: Secondary | ICD-10-CM | POA: Diagnosis not present

## 2019-11-14 DIAGNOSIS — Z0000 Encounter for general adult medical examination without abnormal findings: Secondary | ICD-10-CM | POA: Diagnosis not present

## 2019-11-14 DIAGNOSIS — E2839 Other primary ovarian failure: Secondary | ICD-10-CM | POA: Diagnosis not present

## 2019-11-14 NOTE — Progress Notes (Unsigned)
Patient ID: Angela [name]                 DOB: [birth year]                    MRN: [id number]     HPI: Angela [name] is a 74 y.o. female patient referred to lipid clinic by ***. PMH is significant for [facility] of nonobstructive disease on cath in 2683, diastolic HF, HTN, HLD.  She was recently seen in the office on 5/4 with Richardson Dopp, PA-C. She had felt fatigued and had noted dyspnea with exertion over the past few months.It seemed to be getting worse, with less and less activity. She had some chest pressure. She had the symptoms of chest pressure since her diagnosis of CAD. Her antianginal therapy hadimproved her symptoms in the past.However, recently, her symptoms had worsened. She had to stop intercourse due to severe chest pain. She had some radiation to her interscapular area. She had not had any associated nausea or syncope. She had some mild diaphoresis. Her case was discussed with Dr. Acie Fredrickson and outpatient cardiac catheterization was arranged for further evaluation. Her Imdur was increased to 60mg  daily at this visit.   On 10/28/19, she underwent cardiac cath with successful PCI/DES to [facility]. Also noted to have moderate p/mLAD lesion which was unchanged from cath back in 2017. Plan for DAPT with ASA/Plavix for at least 6 months. The patient was seen by cardiac rehab while in short stay. There were no observed complications post cath. Radial cath site was re-evaluated prior to discharge and found to be stable without any complications. Of note, she has had issues with statins and myalgias. She was agreeable to start ezetimibe.   Today she presents in good spirits. ***  Current Medications: ezetimibe 10mg  daily Intolerances: rosuvastatin 10-40mg  daily (myalgias) Risk Factors: CAD with unstable angina LDL goal: <70  Diet: ***  Exercise: ***  Family History: HTN, and heart disease in mother; heart attack and stroke in Father; diabetes and HTN in sister  Social History: never smoked and no  alcohol use  Labs (09/21/17): TC 227, HDL 44, LDL 149, TG 172  Past Medical History:  Diagnosis Date   Anxiety    Cancer of sigmoid ([place])    Depression (emotion)    DJD (degenerative joint disease)    Fibromyalgia    GERD (gastroesophageal reflux disease)    Hemorrhoids    Hyperlipidemia    Hypertension    IBS (irritable bowel syndrome)    MRSA (methicillin resistant Staphylococcus aureus) septicemia (HCC)    history of   Over weight    Pancreatitis    RLS (restless legs syndrome)    Tachycardia     Current Outpatient Medications on File Prior to Visit  Medication Sig Dispense Refill   aspirin EC 81 MG tablet Take 81 mg by mouth daily.     clopidogrel (PLAVIX) 75 MG tablet Take 1 tablet (75 mg total) by mouth daily with breakfast. 30 tablet 11   Cyanocobalamin (B-12) 2000 MCG TABS Take 2,000 mcg by mouth daily.      DULoxetine (CYMBALTA) 20 MG capsule Take 20 mg by mouth 2 (two) times daily.      escitalopram (LEXAPRO) 10 MG tablet Take 10 mg by mouth daily.     ezetimibe (ZETIA) 10 MG tablet Take 1 tablet (10 mg total) by mouth daily. 30 tablet 11   fluticasone (FLONASE) 50 MCG/ACT nasal spray Place 2 sprays into both  nostrils daily as needed for allergies (seasonal allergies).      furosemide (LASIX) 20 MG tablet Take 1 tablet (20 mg total) by mouth daily. 30 tablet 6   isosorbide mononitrate (IMDUR) 30 MG 24 hr tablet Take 1 tablet (30 mg total) by mouth daily. 90 tablet 3   levorphanol (LEVODROMORAN) 2 MG tablet Take 2 mg by mouth 3 (three) times daily.      LORazepam (ATIVAN) 2 MG tablet Take 2 mg by mouth at bedtime.      lubiprostone (AMITIZA) 24 MCG capsule Take 24 mcg by mouth 2 (two) times daily with a meal.      metoprolol succinate (TOPROL-XL) 50 MG 24 hr tablet Take 2 tablets (100 mg total) by mouth in the morning AND 1 tablet (50 mg total) every evening. Take with or immediately following a meal.. 90 tablet 3   Milnacipran HCl (SAVELLA)  100 MG TABS tablet Take 100 mg by mouth 2 (two) times daily.     nitroGLYCERIN (NITROSTAT) 0.4 MG SL tablet Place 0.4 mg under the tongue every 5 (five) minutes as needed for chest pain.     ondansetron (ZOFRAN) 4 MG tablet Take 4 mg by mouth every 8 (eight) hours as needed for nausea or vomiting.     pantoprazole (PROTONIX) 40 MG tablet Take 1 tablet (40 mg total) by mouth 2 (two) times daily. 30 tablet 11   potassium chloride (KLOR-CON) 10 MEQ tablet Take 1 tablet (10 mEq total) by mouth daily. 30 tablet 3   rOPINIRole (REQUIP) 1 MG tablet Take 1-2 mg by mouth See admin instructions. Take 2 tablets (2 mg) by mouth daily at bedtime, may also take 1 tablet (1 mg) in the afternoon 15:00 for restless legs     rosuvastatin (CRESTOR) 10 MG tablet Take 10 mg by mouth at bedtime.     tiZANidine (ZANAFLEX) 4 MG tablet Take 4 mg by mouth every 8 (eight) hours as needed for muscle spasms.      No current facility-administered medications on file prior to visit.    Allergies  Allergen Reactions   Norvasc [Amlodipine Besylate] Swelling   Phenergan [Promethazine] Other (See Comments)    Restlessness    Timentin [Ticarcillin-Pot Clavulanate] Anaphylaxis    Has patient had a PCN reaction causing immediate rash, facial/tongue/throat swelling, SOB or lightheadedness with hypotension: Yes Has patient had a PCN reaction causing severe rash involving mucus membranes or skin necrosis: Yes Has patient had a PCN reaction that required hospitalization Yes Has patient had a PCN reaction occurring within the last 10 years: No If all of the above answers are "NO", then may proceed with Cephalosporin use.    Pregabalin Other (See Comments)    Mouth sores   Reglan [Metoclopramide] Other (See Comments)    Cannot sit still    Assessment/Plan:  1. Hyperlipidemia -  - ***  Kennon Holter, PharmD PGY1 Ambulatory Care Pharmacy Resident

## 2019-11-15 ENCOUNTER — Ambulatory Visit: Payer: Medicare Other

## 2019-11-16 ENCOUNTER — Telehealth (HOSPITAL_COMMUNITY): Payer: Self-pay | Admitting: *Deleted

## 2019-11-16 DIAGNOSIS — M5030 Other cervical disc degeneration, unspecified cervical region: Secondary | ICD-10-CM | POA: Diagnosis not present

## 2019-11-16 DIAGNOSIS — M25519 Pain in unspecified shoulder: Secondary | ICD-10-CM | POA: Diagnosis not present

## 2019-11-16 DIAGNOSIS — G894 Chronic pain syndrome: Secondary | ICD-10-CM | POA: Diagnosis not present

## 2019-11-16 DIAGNOSIS — M961 Postlaminectomy syndrome, not elsewhere classified: Secondary | ICD-10-CM | POA: Diagnosis not present

## 2019-11-16 NOTE — Telephone Encounter (Signed)
Ann w/[facility] called to report pts labs.  Creat- 2.84 K-5.7  Pt was advised by [facility] to hold Lasix and K until patient hears from our office. Lelon Frohlich said she would fax results to our clinic.  Routed to [place] and [employer]

## 2019-11-16 NOTE — Telephone Encounter (Signed)
Stop Lasix and KCl, repeat BMET on Friday or Monday.

## 2019-11-17 ENCOUNTER — Other Ambulatory Visit: Payer: Self-pay

## 2019-11-17 ENCOUNTER — Ambulatory Visit (HOSPITAL_COMMUNITY)
Admission: RE | Admit: 2019-11-17 | Discharge: 2019-11-17 | Disposition: A | Discharge status: Home / Self Care | Payer: Medicare Other | Source: Ambulatory Visit | Attending: Internal Medicine | Admitting: Internal Medicine

## 2019-11-17 ENCOUNTER — Ambulatory Visit (HOSPITAL_COMMUNITY)
Admission: RE | Admit: 2019-11-17 | Discharge: 2019-11-17 | Disposition: A | Discharge status: Home / Self Care | Payer: Medicare Other | Source: Ambulatory Visit | Attending: Cardiology | Admitting: Cardiology

## 2019-11-17 DIAGNOSIS — I5032 Chronic diastolic (congestive) heart failure: Secondary | ICD-10-CM

## 2019-11-17 DIAGNOSIS — I110 Hypertensive heart disease with heart failure: Secondary | ICD-10-CM | POA: Insufficient documentation

## 2019-11-17 DIAGNOSIS — E785 Hyperlipidemia, unspecified: Secondary | ICD-10-CM | POA: Diagnosis not present

## 2019-11-17 LAB — BASIC METABOLIC PANEL
Anion gap: 9 (ref 5–15)
BUN: 33 mg/dL — ABNORMAL HIGH (ref 8–23)
CO2: 28 mmol/L (ref 22–32)
Calcium: 9 mg/dL (ref 8.9–10.3)
Chloride: 102 mmol/L (ref 98–111)
Creatinine, Ser: 2.33 mg/dL — ABNORMAL HIGH (ref 0.44–1.00)
GFR calc Af Amer: 23 mL/min — ABNORMAL LOW (ref >60)
GFR calc non Af Amer: 20 mL/min — ABNORMAL LOW (ref >60)
Glucose, Bld: 107 mg/dL — ABNORMAL HIGH (ref 70–99)
Potassium: 4.4 mmol/L (ref 3.5–5.1)
Sodium: 139 mmol/L (ref 135–145)

## 2019-11-17 NOTE — Progress Notes (Signed)
  Echocardiogram 2D Echocardiogram has been performed.  Angela [name] 11/17/2019, 10:55 AM

## 2019-11-17 NOTE — Telephone Encounter (Signed)
Spoke with pt during lab appt today she is aware.

## 2019-11-18 DIAGNOSIS — G894 Chronic pain syndrome: Secondary | ICD-10-CM | POA: Diagnosis not present

## 2019-11-18 DIAGNOSIS — M792 Neuralgia and neuritis, unspecified: Secondary | ICD-10-CM | POA: Diagnosis not present

## 2019-11-18 DIAGNOSIS — G609 Hereditary and idiopathic neuropathy, unspecified: Secondary | ICD-10-CM | POA: Diagnosis not present

## 2019-11-18 DIAGNOSIS — F4542 Pain disorder with related psychological factors: Secondary | ICD-10-CM | POA: Diagnosis not present

## 2019-11-21 ENCOUNTER — Telehealth (HOSPITAL_COMMUNITY): Payer: Self-pay | Admitting: *Deleted

## 2019-11-21 ENCOUNTER — Other Ambulatory Visit (HOSPITAL_COMMUNITY): Payer: Self-pay | Admitting: *Deleted

## 2019-11-21 ENCOUNTER — Other Ambulatory Visit: Payer: Self-pay | Admitting: Family Medicine

## 2019-11-21 DIAGNOSIS — I5032 Chronic diastolic (congestive) heart failure: Secondary | ICD-10-CM

## 2019-11-21 DIAGNOSIS — E2839 Other primary ovarian failure: Secondary | ICD-10-CM

## 2019-11-21 MED ORDER — CLOPIDOGREL BISULFATE 75 MG PO TABS: 75.0000 mg | ORAL_TABLET | Freq: Every day | ORAL | 11 refills | Status: DC

## 2019-11-21 MED ORDER — EZETIMIBE 10 MG PO TABS: 10.0000 mg | ORAL_TABLET | Freq: Every day | ORAL | 11 refills | Status: DC

## 2019-11-21 NOTE — Telephone Encounter (Signed)
-----   Message from Larey Dresser, MD sent at 11/17/2019  7:43 PM EDT ----- Stop Lasix and KCl, BMET next week.

## 2019-11-21 NOTE — Telephone Encounter (Signed)
Angela [name], [place]  11/21/2019 11:48 AM EDT Back to Top    Pts PCP stopped lasix and potassium on 6/17. Pt will have labs drawn here 6/23.    Angela [name], [facility]  11/18/2019 10:29 AM EDT     Left VM for pt to return my call for lab results.    Larey Dresser, MD  11/17/2019 7:43 PM EDT     Stop Lasix and KCl, BMET next week.

## 2019-11-22 DIAGNOSIS — H43813 Vitreous degeneration, bilateral: Secondary | ICD-10-CM | POA: Diagnosis not present

## 2019-11-22 DIAGNOSIS — H259 Unspecified age-related cataract: Secondary | ICD-10-CM | POA: Diagnosis not present

## 2019-11-22 DIAGNOSIS — H40053 Ocular hypertension, bilateral: Secondary | ICD-10-CM | POA: Diagnosis not present

## 2019-11-23 ENCOUNTER — Other Ambulatory Visit (HOSPITAL_COMMUNITY): Payer: Medicare Other

## 2019-11-24 ENCOUNTER — Ambulatory Visit: Payer: Medicare Other

## 2019-11-24 ENCOUNTER — Other Ambulatory Visit: Payer: Self-pay

## 2019-11-24 DIAGNOSIS — I5032 Chronic diastolic (congestive) heart failure: Secondary | ICD-10-CM

## 2019-11-24 DIAGNOSIS — E782 Mixed hyperlipidemia: Secondary | ICD-10-CM

## 2019-11-24 NOTE — Progress Notes (Deleted)
Patient ID: Angela [name]                 DOB: [birth year]                    MRN: [id number]     HPI: Angela [name] is a 74 y.o. female patient referred to lipid clinic by Angela [name] at hospital discharge post PCI. PMH is significant for CHF, HTN, angina, and CAD.  Had cardiac cath on 5/28 Was seen by Dr Aundra Dubin 1 week ago for CHF and started on lasix and K. Scr increased to 2.33 and BUN to 33.  Patient was advised to hold meds and have bloodwork rechecked in a week.  Has not been drawn yet.  Current Medications: Zetia 10mg , rosuvastatin 10mg  Intolerances: Myalgias with atorvastatin Risk Factors: HTN, CAD, age, CHF, hx of PCI LDL goal: <70  Diet:   Exercise:   Family History:   Social History:   Labs:  Past Medical History:  Diagnosis Date  . Anxiety   . Cancer of sigmoid ([place])   . Depression (emotion)   . DJD (degenerative joint disease)   . Fibromyalgia   . GERD (gastroesophageal reflux disease)   . Hemorrhoids   . Hyperlipidemia   . Hypertension   . IBS (irritable bowel syndrome)   . MRSA (methicillin resistant Staphylococcus aureus) septicemia ([place] [place])    history of  . Over weight   . Pancreatitis   . RLS (restless legs syndrome)   . Tachycardia     Current Outpatient Medications on File Prior to Visit  Medication Sig Dispense Refill  . aspirin EC 81 MG tablet Take 81 mg by mouth daily.    . clopidogrel (PLAVIX) 75 MG tablet Take 1 tablet (75 mg total) by mouth daily with breakfast. 30 tablet 11  . Cyanocobalamin (B-12) 2000 MCG TABS Take 2,000 mcg by mouth daily.     . DULoxetine (CYMBALTA) 20 MG capsule Take 20 mg by mouth 2 (two) times daily.     Marland Kitchen escitalopram (LEXAPRO) 10 MG tablet Take 10 mg by mouth daily.    Marland Kitchen ezetimibe (ZETIA) 10 MG tablet Take 1 tablet (10 mg total) by mouth daily. 30 tablet 11  . fluticasone (FLONASE) 50 MCG/ACT nasal spray Place 2 sprays into both nostrils daily as needed for allergies (seasonal allergies).     . isosorbide  mononitrate (IMDUR) 30 MG 24 hr tablet Take 1 tablet (30 mg total) by mouth daily. 90 tablet 3  . levorphanol (LEVODROMORAN) 2 MG tablet Take 2 mg by mouth 3 (three) times daily.     Marland Kitchen LORazepam (ATIVAN) 2 MG tablet Take 2 mg by mouth at bedtime.     Marland Kitchen lubiprostone (AMITIZA) 24 MCG capsule Take 24 mcg by mouth 2 (two) times daily with a meal.     . metoprolol succinate (TOPROL-XL) 50 MG 24 hr tablet Take 2 tablets (100 mg total) by mouth in the morning AND 1 tablet (50 mg total) every evening. Take with or immediately following a meal.. 90 tablet 3  . Milnacipran HCl (SAVELLA) 100 MG TABS tablet Take 100 mg by mouth 2 (two) times daily.    . nitroGLYCERIN (NITROSTAT) 0.4 MG SL tablet Place 0.4 mg under the tongue every 5 (five) minutes as needed for chest pain.    Marland Kitchen ondansetron (ZOFRAN) 4 MG tablet Take 4 mg by mouth every 8 (eight) hours as needed for nausea or vomiting.    . pantoprazole (PROTONIX)  40 MG tablet Take 1 tablet (40 mg total) by mouth 2 (two) times daily. 30 tablet 11  . rOPINIRole (REQUIP) 1 MG tablet Take 1-2 mg by mouth See admin instructions. Take 2 tablets (2 mg) by mouth daily at bedtime, may also take 1 tablet (1 mg) in the afternoon 15:00 for restless legs    . rosuvastatin (CRESTOR) 10 MG tablet Take 10 mg by mouth at bedtime.    Marland Kitchen tiZANidine (ZANAFLEX) 4 MG tablet Take 4 mg by mouth every 8 (eight) hours as needed for muscle spasms.      No current facility-administered medications on file prior to visit.    Allergies  Allergen Reactions  . Norvasc [Amlodipine Besylate] Swelling  . Phenergan [Promethazine] Other (See Comments)    Restlessness   . Timentin [Ticarcillin-Pot Clavulanate] Anaphylaxis    Has patient had a PCN reaction causing immediate rash, facial/tongue/throat swelling, SOB or lightheadedness with hypotension: Yes Has patient had a PCN reaction causing severe rash involving mucus membranes or skin necrosis: Yes Has patient had a PCN reaction that  required hospitalization Yes Has patient had a PCN reaction occurring within the last 10 years: No If all of the above answers are "NO", then may proceed with Cephalosporin use.   . Pregabalin Other (See Comments)    Mouth sores  . Reglan [Metoclopramide] Other (See Comments)    Cannot sit still    Assessment/Plan:  1. Hyperlipidemia -

## 2019-11-25 ENCOUNTER — Other Ambulatory Visit: Payer: Medicare Other

## 2019-12-01 ENCOUNTER — Ambulatory Visit (INDEPENDENT_AMBULATORY_CARE_PROVIDER_SITE_OTHER): Payer: Medicare Other | Admitting: Pharmacist

## 2019-12-01 ENCOUNTER — Other Ambulatory Visit: Payer: Self-pay

## 2019-12-01 ENCOUNTER — Other Ambulatory Visit: Payer: Medicare Other | Admitting: *Deleted

## 2019-12-01 DIAGNOSIS — I2510 Atherosclerotic heart disease of native coronary artery without angina pectoris: Secondary | ICD-10-CM | POA: Diagnosis not present

## 2019-12-01 DIAGNOSIS — E782 Mixed hyperlipidemia: Secondary | ICD-10-CM | POA: Diagnosis not present

## 2019-12-01 MED ORDER — ROSUVASTATIN CALCIUM 10 MG PO TABS: 10.0000 mg | ORAL_TABLET | Freq: Every day | ORAL | 3 refills | Status: DC

## 2019-12-01 NOTE — Patient Instructions (Signed)
It was a pleasure to meet you!  I will submit a prior authorization for Repatha once your lab work results. I will call you with the results.  Try to increase your exercise and limit fried foods  Increase metoprolol to 100mg  in the Am and 50mg  in the PM  Call me with any questions [phone]

## 2019-12-01 NOTE — Progress Notes (Signed)
Patient ID: OCIA SIMEK                 DOB: Oct 19, 1945                    MRN: [id number]     HPI: UBAH RADKE is a 74 y.o. female patient referred to lipid clinic by Dr. Aundra Dubin. PMH is significant for CAD, diastolic CHF, and exertional dyspnea. She has recently switched her care to Dr. Aundra Dubin. Sent placed 5/21.   Patient presents today to lipid clinic. Her last lipid panel is from 2019. LDL was 95. She is unsure what medication she was on at that time. Currently on rosuvastatin 10mg  most days of the week and zetia. Occasionally she gets muscle pains and has to take rosuvastatin every other day. Is going to start cardiac rehab, but has not received a call yet. Plans to start doing exercises in the pool.  Current Medications: rosuvastatin 10mg  daily, zetia 10mg  daily Intolerances: atorvastatin (muscle pains), rosuvastatin at 40 and 20mg  (myalgias), zetia (muscle pains) Risk Factors: CAD LDL goal: <70  Diet: breakfast: coffee, toast w/ jelly or PB Snacks a lot: salty food- pretzels, trisquts, durettos, crackers and PB Has gastric paresis not supposed to have salad, raw vegetables, unpeeled fruit Cheese/crackers, cottage cheese, tuna, left overs Dinner: tacos, hamburgers, spaghetti, crock pot meals- chicken w/ noodles, chicken w/ rice, pot-roast  Exercise: very little  Family History:  Family History  Problem Relation Age of Onset   Hypertension Mother    Heart disease Mother    Heart attack Father    Stroke Father    Diabetes Sister    Hypertension Sister    Stroke Other    Diabetes Other    Heart disease Other      Social History: never smokes, no alcohol  Labs: 03/30/18: TC 170 HDL 40 TG 173 LDL 95 (unsure what medication she was on)  Past Medical History:  Diagnosis Date   Anxiety    Cancer of sigmoid ([place])    Depression (emotion)    DJD (degenerative joint disease)    Fibromyalgia    GERD (gastroesophageal reflux disease)    Hemorrhoids     Hyperlipidemia    Hypertension    IBS (irritable bowel syndrome)    MRSA (methicillin resistant Staphylococcus aureus) septicemia ([facility])    history of   Over weight    Pancreatitis    RLS (restless legs syndrome)    Tachycardia     Current Outpatient Medications on File Prior to Visit  Medication Sig Dispense Refill   aspirin EC 81 MG tablet Take 81 mg by mouth daily.     clopidogrel (PLAVIX) 75 MG tablet Take 1 tablet (75 mg total) by mouth daily with breakfast. 30 tablet 11   Cyanocobalamin (B-12) 2000 MCG TABS Take 2,000 mcg by mouth daily.      DULoxetine (CYMBALTA) 20 MG capsule Take 20 mg by mouth 2 (two) times daily.      escitalopram (LEXAPRO) 10 MG tablet Take 10 mg by mouth daily.     ezetimibe (ZETIA) 10 MG tablet Take 1 tablet (10 mg total) by mouth daily. 30 tablet 11   fluticasone (FLONASE) 50 MCG/ACT nasal spray Place 2 sprays into both nostrils daily as needed for allergies (seasonal allergies).      isosorbide mononitrate (IMDUR) 30 MG 24 hr tablet Take 1 tablet (30 mg total) by mouth daily. 90 tablet 3   levorphanol (LEVODROMORAN) 2 MG  tablet Take 2 mg by mouth 3 (three) times daily.      LORazepam (ATIVAN) 2 MG tablet Take 2 mg by mouth at bedtime.      lubiprostone (AMITIZA) 24 MCG capsule Take 24 mcg by mouth 2 (two) times daily with a meal.      metoprolol succinate (TOPROL-XL) 50 MG 24 hr tablet Take 2 tablets (100 mg total) by mouth in the morning AND 1 tablet (50 mg total) every evening. Take with or immediately following a meal.. 90 tablet 3   Milnacipran HCl (SAVELLA) 100 MG TABS tablet Take 100 mg by mouth 2 (two) times daily.     nitroGLYCERIN (NITROSTAT) 0.4 MG SL tablet Place 0.4 mg under the tongue every 5 (five) minutes as needed for chest pain.     ondansetron (ZOFRAN) 4 MG tablet Take 4 mg by mouth every 8 (eight) hours as needed for nausea or vomiting.     pantoprazole (PROTONIX) 40 MG tablet Take 1 tablet (40 mg total) by mouth 2  (two) times daily. 30 tablet 11   rOPINIRole (REQUIP) 1 MG tablet Take 1-2 mg by mouth See admin instructions. Take 2 tablets (2 mg) by mouth daily at bedtime, may also take 1 tablet (1 mg) in the afternoon 15:00 for restless legs     rosuvastatin (CRESTOR) 10 MG tablet Take 10 mg by mouth at bedtime.     tiZANidine (ZANAFLEX) 4 MG tablet Take 4 mg by mouth every 8 (eight) hours as needed for muscle spasms.      No current facility-administered medications on file prior to visit.    Allergies  Allergen Reactions   Norvasc [Amlodipine Besylate] Swelling   Phenergan [Promethazine] Other (See Comments)    Restlessness    Timentin [Ticarcillin-Pot Clavulanate] Anaphylaxis    Has patient had a PCN reaction causing immediate rash, facial/tongue/throat swelling, SOB or lightheadedness with hypotension: Yes Has patient had a PCN reaction causing severe rash involving mucus membranes or skin necrosis: Yes Has patient had a PCN reaction that required hospitalization Yes Has patient had a PCN reaction occurring within the last 10 years: No If all of the above answers are "NO", then may proceed with Cephalosporin use.    Pregabalin Other (See Comments)    Mouth sores   Reglan [Metoclopramide] Other (See Comments)    Cannot sit still    Assessment/Plan:  1. Hyperlipidemia - LDL in 2019 was above goal. Suspect LDL is still above goal of <70. Will await lipid panel results from today. Discussed PCSK9i, cost and injection technique. Patient agreeable to try. Will submit prior authorization tomorrow once labs result. I did provide patient with a sample since its much cheaper for her to use mail order and get a 90 day supply. Will discuss tomorrow if she needs to continue zetia. Did discuss she will need to continue rosuvastatin no matter what the labs show. Refill of rosuvastatin sent to [facility]  2. Metoprolol- patient asked about metoprolol and when dose she should be on. Advised that  Dr. Aundra Dubin increased her metoprolol to 100mg  in the AM and 50mg  in the PM. Patient will increase dose. Did confirm she stopped the K and furosemide.    Thank you,  Ramond Dial, Pharm.D, BCPS, CPP [facility]  [address], [place], [place] [address]  Phone: [phone]; Fax: [phone]

## 2019-12-02 ENCOUNTER — Telehealth: Payer: Self-pay | Admitting: Pharmacist

## 2019-12-02 DIAGNOSIS — I25119 Atherosclerotic heart disease of native coronary artery with unspecified angina pectoris: Secondary | ICD-10-CM

## 2019-12-02 LAB — LIPID PANEL
Chol/HDL Ratio: 3.4 ratio (ref 0.0–4.4)
Cholesterol, Total: 141 mg/dL (ref 100–199)
HDL: 42 mg/dL (ref >39)
LDL Chol Calc (NIH): 72 mg/dL (ref 0–99)
Triglycerides: 155 mg/dL — ABNORMAL HIGH (ref 0–149)
VLDL Cholesterol Cal: 27 mg/dL (ref 5–40)

## 2019-12-02 NOTE — Telephone Encounter (Signed)
LDL resulted at 72. This is a much better than expected. Will discuss with patient. I think the best clinical thing to do would be to stop zetia and add a PCSK9i to get additional lowering and cardiovascular risk reduction. However, if cost is going to be an issue with patient, then it is reasonable to stay on the current therapy.  Called and left message with her husband to call me back.

## 2019-12-06 ENCOUNTER — Telehealth (HOSPITAL_COMMUNITY): Payer: Self-pay | Admitting: Cardiology

## 2019-12-06 NOTE — Addendum Note (Signed)
Addended by: Marcelle Overlie D on: 12/06/2019 03:21 PM   Modules accepted: Orders

## 2019-12-06 NOTE — Telephone Encounter (Signed)
-----   Message from Larey Dresser, MD sent at 12/02/2019  8:56 PM EDT ----- Good lipids

## 2019-12-06 NOTE — Telephone Encounter (Signed)
Normal lipid letter mailed

## 2019-12-06 NOTE — Telephone Encounter (Signed)
Patient to agreeable to starting Repatha. Will need an LDL above 100 to be approved by [employer]. Will stop Zetia for 2 weeks and recheck lipid panel after her vacation.

## 2019-12-07 ENCOUNTER — Ambulatory Visit
Admission: RE | Admit: 2019-12-07 | Discharge: 2019-12-07 | Disposition: A | Discharge status: Home / Self Care | Payer: Medicare Other | Source: Ambulatory Visit | Attending: Family Medicine | Admitting: Family Medicine

## 2019-12-07 ENCOUNTER — Other Ambulatory Visit: Payer: Self-pay

## 2019-12-07 DIAGNOSIS — E2839 Other primary ovarian failure: Secondary | ICD-10-CM

## 2019-12-07 DIAGNOSIS — Z780 Asymptomatic menopausal state: Secondary | ICD-10-CM | POA: Diagnosis not present

## 2019-12-07 DIAGNOSIS — M8589 Other specified disorders of bone density and structure, multiple sites: Secondary | ICD-10-CM | POA: Diagnosis not present

## 2019-12-19 DIAGNOSIS — M5030 Other cervical disc degeneration, unspecified cervical region: Secondary | ICD-10-CM | POA: Diagnosis not present

## 2019-12-19 DIAGNOSIS — Z79899 Other long term (current) drug therapy: Secondary | ICD-10-CM | POA: Diagnosis not present

## 2019-12-19 DIAGNOSIS — M961 Postlaminectomy syndrome, not elsewhere classified: Secondary | ICD-10-CM | POA: Diagnosis not present

## 2019-12-19 DIAGNOSIS — G894 Chronic pain syndrome: Secondary | ICD-10-CM | POA: Diagnosis not present

## 2019-12-19 DIAGNOSIS — Z79891 Long term (current) use of opiate analgesic: Secondary | ICD-10-CM | POA: Diagnosis not present

## 2019-12-19 DIAGNOSIS — M5412 Radiculopathy, cervical region: Secondary | ICD-10-CM | POA: Diagnosis not present

## 2019-12-20 ENCOUNTER — Ambulatory Visit (HOSPITAL_COMMUNITY)
Admission: RE | Admit: 2019-12-20 | Discharge: 2019-12-20 | Disposition: A | Discharge status: Home / Self Care | Payer: Medicare Other | Source: Ambulatory Visit | Attending: Cardiology | Admitting: Cardiology

## 2019-12-20 ENCOUNTER — Encounter (HOSPITAL_COMMUNITY): Payer: Self-pay | Admitting: Cardiology

## 2019-12-20 ENCOUNTER — Other Ambulatory Visit: Payer: Self-pay

## 2019-12-20 VITALS — BP 156/92 | HR 72 | Wt 181.0 lb

## 2019-12-20 DIAGNOSIS — I110 Hypertensive heart disease with heart failure: Secondary | ICD-10-CM | POA: Diagnosis not present

## 2019-12-20 DIAGNOSIS — N179 Acute kidney failure, unspecified: Secondary | ICD-10-CM | POA: Insufficient documentation

## 2019-12-20 DIAGNOSIS — E785 Hyperlipidemia, unspecified: Secondary | ICD-10-CM | POA: Insufficient documentation

## 2019-12-20 DIAGNOSIS — K3184 Gastroparesis: Secondary | ICD-10-CM | POA: Insufficient documentation

## 2019-12-20 DIAGNOSIS — Z79899 Other long term (current) drug therapy: Secondary | ICD-10-CM | POA: Diagnosis not present

## 2019-12-20 DIAGNOSIS — R0609 Other forms of dyspnea: Secondary | ICD-10-CM

## 2019-12-20 DIAGNOSIS — Z8249 Family history of ischemic heart disease and other diseases of the circulatory system: Secondary | ICD-10-CM | POA: Insufficient documentation

## 2019-12-20 DIAGNOSIS — Z7982 Long term (current) use of aspirin: Secondary | ICD-10-CM | POA: Insufficient documentation

## 2019-12-20 DIAGNOSIS — I2510 Atherosclerotic heart disease of native coronary artery without angina pectoris: Secondary | ICD-10-CM | POA: Diagnosis not present

## 2019-12-20 DIAGNOSIS — Z7902 Long term (current) use of antithrombotics/antiplatelets: Secondary | ICD-10-CM | POA: Insufficient documentation

## 2019-12-20 DIAGNOSIS — R0600 Dyspnea, unspecified: Secondary | ICD-10-CM | POA: Diagnosis not present

## 2019-12-20 DIAGNOSIS — I5032 Chronic diastolic (congestive) heart failure: Secondary | ICD-10-CM | POA: Insufficient documentation

## 2019-12-20 LAB — BASIC METABOLIC PANEL
Anion gap: 8 (ref 5–15)
BUN: 11 mg/dL (ref 8–23)
CO2: 26 mmol/L (ref 22–32)
Calcium: 8.7 mg/dL — ABNORMAL LOW (ref 8.9–10.3)
Chloride: 108 mmol/L (ref 98–111)
Creatinine, Ser: 1.08 mg/dL — ABNORMAL HIGH (ref 0.44–1.00)
GFR calc Af Amer: 59 mL/min — ABNORMAL LOW (ref >60)
GFR calc non Af Amer: 51 mL/min — ABNORMAL LOW (ref >60)
Glucose, Bld: 104 mg/dL — ABNORMAL HIGH (ref 70–99)
Potassium: 3.7 mmol/L (ref 3.5–5.1)
Sodium: 142 mmol/L (ref 135–145)

## 2019-12-20 MED ORDER — METOPROLOL SUCCINATE ER 100 MG PO TB24
100.0000 mg | ORAL_TABLET | Freq: Two times a day (BID) | ORAL | 6 refills | Status: DC
Start: 2019-12-20 — End: 2021-04-24

## 2019-12-20 NOTE — Patient Instructions (Signed)
Increase Metoprolol to 100 mg Twice daily   Labs done today, your results will be available in MyChart, we will contact you for abnormal readings.  You have been referred to Cardiac Rehab at [facility], they will call you to schedule this  Please call our office in January to schedule your follow up appointment.  If you have any questions or concerns before your next appointment please send [place] a message through [place] or call our office at [phone].    TO LEAVE A MESSAGE FOR THE NURSE SELECT OPTION 2, PLEASE LEAVE A MESSAGE INCLUDING: . YOUR NAME . DATE OF BIRTH . CALL BACK NUMBER . REASON FOR CALL**this is important as we prioritize the call backs  [place] AS LONG AS YOU CALL BEFORE 4:00 PM  At the [facility], you and your health needs are our priority. As part of our continuing mission to provide you with exceptional heart care, we have created designated Provider Care Teams. These Care Teams include your primary Cardiologist (physician) and Advanced Practice Providers (APPs- Physician Assistants and Nurse Practitioners) who all work together to provide you with the care you need, when you need it.   You may see any of the following providers on your designated Care Team at your next follow up: Marland Kitchen Dr Glori Bickers . Dr Loralie Champagne . Darrick Grinder, NP . [name], PA . Audry Riles, PharmD   Please be sure to bring in all your medications bottles to every appointment.

## 2019-12-21 NOTE — Progress Notes (Signed)
PCP: [name], MD Cardiology: Dr. [name] HF Cardiology: Dr. Aundra Dubin  74 y.o. with history of CAD, diastolic CHF, and exertional dyspnea was self-referred to CHF clinic.  I take care of her husband, Angela [name].  She has struggled with exertional dyspnea for the last several months.  Last echo in 1/19 showed vigorous LV systolic function with a mild intracavitary gradient.  Given exertional dyspnea and chest pressure, she had LHC in 5/21, showing 95% pRCA stenosis that was treated with DES.   Despite PCI, she did not feel much better.  She continued to have exertional dyspnea and chest pressure.  Her chest pressure was nonexertional and happened at random.  Echo was done in 6/21, showing hyperdynamic LV with EF 65-70%, moderate LVH, normal RV.  I increased her Toprol XL to see if this would help her diastolic function and also started her on low dose Lasix 20 mg daily.  Creatinine rose to 2.33 and Lasix was later stopped.    She returns today for followup of dyspnea.  Despite having to stop Lasix, she is feeling better.  She remains on the higher dose of Toprol XL.  She is still short of breath walking up stairs but does fine walking on flat ground.  No orthopnea/PND.  Only 1 episode of chest pain since last visit during heavy exertion.  BP still running high at home but improving.   Labs (5/21): K 4.1, creatinine 1.07. NT-proBNP 721 Labs (6/21): creatinine 2.33 Labs (7/21): LDL 72  PMH:  1. Diastolic CHF: Echo in 3/76 with EF 65-70%, mild LV intracavitary gradient, mild LVH, very mild AS, RV size and systolic function normal.   - Echo (6/21): EF 65-70%, moderate LVH, normal RV, mild MR.  2. Hyperlipidemia; Myalgias with atorvastatin, tolerates low dose Crestor.  3. CAD: LHC in 2017 with moderate nonobstructive disease.  - LHC (5/21): 70% D1, 95% pRCA, 60% mLAD, 60% dLAD. DES to RCA.  4. HTN: Edema with amlodipine.  5. Gastroparesis  ROS: All systems reviewed and negative except as per  HPI.   Social History   Socioeconomic History  . Marital status: Married    Spouse name: Not on file  . Number of children: Not on file  . Years of education: Not on file  . Highest education level: Not on file  Occupational History  . Not on file  Tobacco Use  . Smoking status: Never Smoker  . Smokeless tobacco: Never Used  Substance and Sexual Activity  . Alcohol use: No    Alcohol/week: 0.0 standard drinks  . Drug use: No  . Sexual activity: Not on file  Other Topics Concern  . Not on file  Social History Narrative  . Not on file   Social Determinants of Health   Financial Resource Strain:   . Difficulty of Paying Living Expenses:   Food Insecurity:   . Worried About Charity fundraiser in the Last Year:   . Arboriculturist in the Last Year:   Transportation Needs:   . Film/video editor (Medical):   Marland Kitchen Lack of Transportation (Non-Medical):   Physical Activity:   . Days of Exercise per Week:   . Minutes of Exercise per Session:   Stress:   . Feeling of Stress :   Social Connections:   . Frequency of Communication with Friends and Family:   . Frequency of Social Gatherings with Friends and Family:   . Attends Religious Services:   . Active Member of  Clubs or Organizations:   . Attends Archivist Meetings:   Marland Kitchen Marital Status:   Intimate Partner Violence:   . Fear of Current or Ex-Partner:   . Emotionally Abused:   Marland Kitchen Physically Abused:   . Sexually Abused:    Family History  Problem Relation Age of Onset  . Hypertension Mother   . Heart disease Mother   . Heart attack Father   . Stroke Father   . Diabetes Sister   . Hypertension Sister   . Stroke Other   . Diabetes Other   . Heart disease Other    Current Outpatient Medications  Medication Sig Dispense Refill  . aspirin EC 81 MG tablet Take 81 mg by mouth daily.    . clopidogrel (PLAVIX) 75 MG tablet Take 1 tablet (75 mg total) by mouth daily with breakfast. 30 tablet 11  .  Cyanocobalamin (B-12) 2000 MCG TABS Take 2,000 mcg by mouth daily.     . DULoxetine (CYMBALTA) 20 MG capsule Take 20 mg by mouth 2 (two) times daily.     Marland Kitchen escitalopram (LEXAPRO) 10 MG tablet Take 10 mg by mouth daily.    . fluticasone (FLONASE) 50 MCG/ACT nasal spray Place 2 sprays into both nostrils daily as needed for allergies (seasonal allergies).     . isosorbide mononitrate (IMDUR) 30 MG 24 hr tablet Take 1 tablet (30 mg total) by mouth daily. 90 tablet 3  . levorphanol (LEVODROMORAN) 2 MG tablet Take 2 mg by mouth 3 (three) times daily.     Marland Kitchen LORazepam (ATIVAN) 2 MG tablet Take 2 mg by mouth at bedtime.     Marland Kitchen lubiprostone (AMITIZA) 24 MCG capsule Take 24 mcg by mouth 2 (two) times daily with a meal.     . metoprolol succinate (TOPROL-XL) 100 MG 24 hr tablet Take 1 tablet (100 mg total) by mouth 2 (two) times daily. Take with or immediately following a meal. 180 tablet 6  . ondansetron (ZOFRAN) 4 MG tablet Take 4 mg by mouth every 8 (eight) hours as needed for nausea or vomiting.    . pantoprazole (PROTONIX) 40 MG tablet Take 1 tablet (40 mg total) by mouth 2 (two) times daily. 30 tablet 11  . rOPINIRole (REQUIP) 1 MG tablet Take 1-2 mg by mouth See admin instructions. Take 2 tablets (2 mg) by mouth daily at bedtime, may also take 1 tablet (1 mg) in the afternoon 15:00 for restless legs    . rosuvastatin (CRESTOR) 10 MG tablet Take 1 tablet (10 mg total) by mouth at bedtime. 90 tablet 3  . tiZANidine (ZANAFLEX) 4 MG tablet Take 4 mg by mouth every 8 (eight) hours as needed for muscle spasms.     Marland Kitchen ezetimibe (ZETIA) 10 MG tablet Take 1 tablet (10 mg total) by mouth daily. (Patient not taking: Reported on 12/20/2019) 30 tablet 11  . Milnacipran HCl (SAVELLA) 100 MG TABS tablet Take 100 mg by mouth 2 (two) times daily. (Patient not taking: Reported on 12/20/2019)    . nitroGLYCERIN (NITROSTAT) 0.4 MG SL tablet Place 0.4 mg under the tongue every 5 (five) minutes as needed for chest pain. (Patient  not taking: Reported on 12/20/2019)     No current facility-administered medications for this encounter.   BP (!) 156/92   Pulse 72   Wt 82.1 kg (181 lb)   SpO2 98%   BMI 29.21 kg/m  General: NAD Neck: No JVD, no thyromegaly or thyroid nodule.  Lungs: Clear to auscultation  bilaterally with normal respiratory effort. CV: Nondisplaced PMI.  Heart regular S1/S2, no S3/S4, 1/6 SEM RUSB.  No peripheral edema.  No carotid bruit.  Normal pedal pulses.  Abdomen: Soft, nontender, no hepatosplenomegaly, no distention.  Skin: Intact without lesions or rashes.  Neurologic: Alert and oriented x 3.  Psych: Normal affect. Extremities: No clubbing or cyanosis.  HEENT: Normal.   Assessment/Plan:  1. Chronic diastolic CHF: Echo in 5/00 showed vigorous LV systolic function EF 93-81% with intracavity gradient.  Echo in 6/21 showed EF 65-70% with moderate LVH, normal RV.  Symptoms significantly improved, now NYHA class II.  She does not appear volume overloaded on exam.  She did well with increase in beta blocker.  - Increase Toprol XL to 100 mg bid.   - Can continue Imdur 30 mg daily.  - Off Lasix with AKI, think he can stay off.  2. Coronary artery disease: DES to RCA in 5/21.  - Continue ASA 81 and Plavix 75 daily.  - Continue Crestor 10 g daily and Zetia 10 mg daily.   - I will refer her to cardiac rehab.  3. AKI: Creatinine up to 2.33 with Lasix, now stopped.  - BMET today.  4. HTN: Increasing Toprol XL.   Followup in 6 months.    Loralie Champagne 12/21/2019

## 2020-01-09 ENCOUNTER — Other Ambulatory Visit: Payer: Medicare Other

## 2020-01-12 ENCOUNTER — Telehealth: Payer: Self-pay

## 2020-01-12 DIAGNOSIS — E782 Mixed hyperlipidemia: Secondary | ICD-10-CM

## 2020-01-12 NOTE — Telephone Encounter (Signed)
Called and spoke to the pt's husband who stated that he would have her call us back as soon as she becomes available to schedule her labs with [place].

## 2020-01-12 NOTE — Telephone Encounter (Signed)
-----   Message from Ramond Dial, [place] sent at 01/12/2020  8:29 AM EDT ----- Pt missed her lab apt on the 9th. Can you reschedule? ----- Message ----- From: Ramond Dial, RPH-CPP Sent: 01/10/2020 To: Ramond Dial, RPH-CPP  LDL needs to be >100 for [employer] to pay

## 2020-01-17 DIAGNOSIS — M5030 Other cervical disc degeneration, unspecified cervical region: Secondary | ICD-10-CM | POA: Diagnosis not present

## 2020-01-17 DIAGNOSIS — G894 Chronic pain syndrome: Secondary | ICD-10-CM | POA: Diagnosis not present

## 2020-01-17 DIAGNOSIS — M961 Postlaminectomy syndrome, not elsewhere classified: Secondary | ICD-10-CM | POA: Diagnosis not present

## 2020-01-17 DIAGNOSIS — M542 Cervicalgia: Secondary | ICD-10-CM | POA: Diagnosis not present

## 2020-01-23 ENCOUNTER — Other Ambulatory Visit: Payer: Medicare Other

## 2020-01-26 ENCOUNTER — Telehealth: Payer: Self-pay

## 2020-01-26 DIAGNOSIS — E782 Mixed hyperlipidemia: Secondary | ICD-10-CM

## 2020-01-26 NOTE — Telephone Encounter (Signed)
-----   Message from Ramond Dial, [place] sent at 01/26/2020  7:21 AM EDT ----- Please call to reschedule labs ----- Message ----- From: Ramond Dial, RPH-CPP Sent: 01/24/2020 To: Ramond Dial, RPH-CPP   ----- Message ----- From: Ramond Dial, RPH-CPP Sent: 01/10/2020 To: Ramond Dial, RPH-CPP  LDL needs to be >100 for [employer] to pay

## 2020-01-26 NOTE — Telephone Encounter (Signed)
lmomed for lipid labs

## 2020-01-30 ENCOUNTER — Other Ambulatory Visit: Payer: Medicare Other

## 2020-02-01 ENCOUNTER — Other Ambulatory Visit: Payer: Medicare Other | Admitting: *Deleted

## 2020-02-01 ENCOUNTER — Other Ambulatory Visit: Payer: Self-pay

## 2020-02-01 DIAGNOSIS — E782 Mixed hyperlipidemia: Secondary | ICD-10-CM

## 2020-02-01 LAB — LIPID PANEL
Chol/HDL Ratio: 4 ratio (ref 0.0–4.4)
Cholesterol, Total: 158 mg/dL (ref 100–199)
HDL: 40 mg/dL (ref >39)
LDL Chol Calc (NIH): 93 mg/dL (ref 0–99)
Triglycerides: 142 mg/dL (ref 0–149)
VLDL Cholesterol Cal: 25 mg/dL (ref 5–40)

## 2020-02-03 NOTE — Telephone Encounter (Signed)
[employer] rejected request for Repatha again due to patient's LDL <100

## 2020-02-07 ENCOUNTER — Other Ambulatory Visit (HOSPITAL_COMMUNITY): Payer: Self-pay | Admitting: *Deleted

## 2020-02-07 MED ORDER — CLOPIDOGREL BISULFATE 75 MG PO TABS: 75.0000 mg | ORAL_TABLET | Freq: Every day | ORAL | 11 refills | Status: DC

## 2020-02-07 NOTE — Telephone Encounter (Signed)
Aware of the rejection. [employer] requires LDL to be >100, which goes against good clinic decision making. I will write an appeals letter and submit.

## 2020-02-08 ENCOUNTER — Other Ambulatory Visit: Payer: Medicare Other

## 2020-02-08 NOTE — Telephone Encounter (Signed)
Appeals faxed 02/08/20

## 2020-02-14 ENCOUNTER — Telehealth: Payer: Self-pay | Admitting: Pharmacist

## 2020-02-14 NOTE — Telephone Encounter (Signed)
Received fax from [place] ([facility]) that patient needs to appoint [place] in order for [place] to appeal on her behalf. I have left the premade letter than patient needs to sign in the sample box at the front desk. Patient is to sign and then we will fax back to ES for her. She will come tomorrow 9/15 around lunch time.

## 2020-02-15 DIAGNOSIS — M961 Postlaminectomy syndrome, not elsewhere classified: Secondary | ICD-10-CM | POA: Diagnosis not present

## 2020-02-15 DIAGNOSIS — M5030 Other cervical disc degeneration, unspecified cervical region: Secondary | ICD-10-CM | POA: Diagnosis not present

## 2020-02-15 DIAGNOSIS — G894 Chronic pain syndrome: Secondary | ICD-10-CM | POA: Diagnosis not present

## 2020-02-15 DIAGNOSIS — M5412 Radiculopathy, cervical region: Secondary | ICD-10-CM | POA: Diagnosis not present

## 2020-02-16 NOTE — Telephone Encounter (Signed)
Signed form allowing me to appeal on pt behalf was faxed to [facility] [phone]

## 2020-02-17 ENCOUNTER — Other Ambulatory Visit: Payer: Self-pay | Admitting: *Deleted

## 2020-02-21 ENCOUNTER — Telehealth: Payer: Self-pay | Admitting: Pharmacist

## 2020-02-21 MED ORDER — REPATHA SURECLICK 140 MG/ML ~~LOC~~ SOAJ: 1.0000 "pen " | SUBCUTANEOUS | 3 refills | Status: DC

## 2020-02-21 NOTE — Telephone Encounter (Signed)
Patient returning call.

## 2020-02-21 NOTE — Addendum Note (Signed)
Addended by: Marcelle Overlie D on: 02/21/2020 10:07 AM   Modules accepted: Orders

## 2020-02-21 NOTE — Telephone Encounter (Signed)
Patient made aware that Repatha was approved. Rx sent to [facility] per request. Will repeat labs in 3 months

## 2020-02-21 NOTE — Telephone Encounter (Signed)
Repatha appeal overturned. No end date provided on letter Called and left message with husband for patient to call back. Will need to confirm with patient she would like [facility]

## 2020-03-06 DIAGNOSIS — Z23 Encounter for immunization: Secondary | ICD-10-CM | POA: Diagnosis not present

## 2020-03-14 DIAGNOSIS — M5030 Other cervical disc degeneration, unspecified cervical region: Secondary | ICD-10-CM | POA: Diagnosis not present

## 2020-03-14 DIAGNOSIS — M961 Postlaminectomy syndrome, not elsewhere classified: Secondary | ICD-10-CM | POA: Diagnosis not present

## 2020-03-14 DIAGNOSIS — Z79899 Other long term (current) drug therapy: Secondary | ICD-10-CM | POA: Diagnosis not present

## 2020-03-14 DIAGNOSIS — Z79891 Long term (current) use of opiate analgesic: Secondary | ICD-10-CM | POA: Diagnosis not present

## 2020-03-14 DIAGNOSIS — G894 Chronic pain syndrome: Secondary | ICD-10-CM | POA: Diagnosis not present

## 2020-03-14 DIAGNOSIS — M797 Fibromyalgia: Secondary | ICD-10-CM | POA: Diagnosis not present

## 2020-04-13 DIAGNOSIS — G894 Chronic pain syndrome: Secondary | ICD-10-CM | POA: Diagnosis not present

## 2020-04-13 DIAGNOSIS — M5030 Other cervical disc degeneration, unspecified cervical region: Secondary | ICD-10-CM | POA: Diagnosis not present

## 2020-04-13 DIAGNOSIS — M961 Postlaminectomy syndrome, not elsewhere classified: Secondary | ICD-10-CM | POA: Diagnosis not present

## 2020-04-13 DIAGNOSIS — M5412 Radiculopathy, cervical region: Secondary | ICD-10-CM | POA: Diagnosis not present

## 2020-05-14 DIAGNOSIS — M5030 Other cervical disc degeneration, unspecified cervical region: Secondary | ICD-10-CM | POA: Diagnosis not present

## 2020-05-14 DIAGNOSIS — M797 Fibromyalgia: Secondary | ICD-10-CM | POA: Diagnosis not present

## 2020-05-14 DIAGNOSIS — G894 Chronic pain syndrome: Secondary | ICD-10-CM | POA: Diagnosis not present

## 2020-05-14 DIAGNOSIS — M25519 Pain in unspecified shoulder: Secondary | ICD-10-CM | POA: Diagnosis not present

## 2020-05-21 ENCOUNTER — Telehealth: Payer: Self-pay

## 2020-05-21 ENCOUNTER — Encounter (HOSPITAL_COMMUNITY): Payer: Self-pay

## 2020-05-21 MED ORDER — CLOPIDOGREL BISULFATE 75 MG PO TABS: 75.0000 mg | ORAL_TABLET | Freq: Every day | ORAL | 3 refills | Status: DC

## 2020-05-21 NOTE — Telephone Encounter (Signed)
Called and lmomed pt to schedule lipid lab

## 2020-05-21 NOTE — Telephone Encounter (Signed)
-----   Message from Ramond Dial, [place] sent at 05/15/2020  9:29 AM EST -----  ----- Message ----- From: Ramond Dial, RPH-CPP Sent: 05/15/2020  12:00 AM EST To: Ramond Dial, RPH-CPP  Set up lipids

## 2020-05-28 NOTE — Telephone Encounter (Signed)
Lipids scheduled for 1/12

## 2020-06-11 DIAGNOSIS — M5412 Radiculopathy, cervical region: Secondary | ICD-10-CM | POA: Diagnosis not present

## 2020-06-11 DIAGNOSIS — M961 Postlaminectomy syndrome, not elsewhere classified: Secondary | ICD-10-CM | POA: Diagnosis not present

## 2020-06-11 DIAGNOSIS — Z79891 Long term (current) use of opiate analgesic: Secondary | ICD-10-CM | POA: Diagnosis not present

## 2020-06-11 DIAGNOSIS — G894 Chronic pain syndrome: Secondary | ICD-10-CM | POA: Diagnosis not present

## 2020-06-11 DIAGNOSIS — M5030 Other cervical disc degeneration, unspecified cervical region: Secondary | ICD-10-CM | POA: Diagnosis not present

## 2020-06-11 DIAGNOSIS — Z79899 Other long term (current) drug therapy: Secondary | ICD-10-CM | POA: Diagnosis not present

## 2020-06-13 ENCOUNTER — Other Ambulatory Visit: Payer: Medicare Other

## 2020-06-28 ENCOUNTER — Telehealth: Payer: Self-pay

## 2020-06-28 NOTE — Telephone Encounter (Signed)
-----   Message from Ramond Dial, [place] sent at 06/28/2020  1:25 PM EST ----- Can you try to reschedule her labs? She missed apt ----- Message ----- From: Ramond Dial, RPH-CPP Sent: 06/14/2020  12:00 AM EST To: Ramond Dial, RPH-CPP  lipids ----- Message ----- From: Allean Found, CMA Sent: 05/21/2020   8:04 AM EST To: Ramond Dial, RPH-CPP  lmom ----- Message ----- From: Ramond Dial, RPH-CPP Sent: 05/15/2020   9:29 AM EST To: Allean Found, CMA   ----- Message ----- From: Ramond Dial, RPH-CPP Sent: 05/15/2020  12:00 AM EST To: Ramond Dial, RPH-CPP  Set up lipids

## 2020-06-28 NOTE — Telephone Encounter (Signed)
lmom pt missed lipid lab appt

## 2020-07-09 ENCOUNTER — Other Ambulatory Visit: Payer: Medicare Other

## 2020-07-09 MED ORDER — ROSUVASTATIN CALCIUM 10 MG PO TABS: 10.0000 mg | ORAL_TABLET | Freq: Every day | ORAL | 3 refills | Status: DC

## 2020-07-09 MED ORDER — EZETIMIBE 10 MG PO TABS: 10.0000 mg | ORAL_TABLET | Freq: Every day | ORAL | 3 refills | Status: DC

## 2020-07-11 DIAGNOSIS — R5383 Other fatigue: Secondary | ICD-10-CM | POA: Diagnosis not present

## 2020-07-11 DIAGNOSIS — I2510 Atherosclerotic heart disease of native coronary artery without angina pectoris: Secondary | ICD-10-CM | POA: Diagnosis not present

## 2020-07-11 DIAGNOSIS — M5412 Radiculopathy, cervical region: Secondary | ICD-10-CM | POA: Diagnosis not present

## 2020-07-11 DIAGNOSIS — E785 Hyperlipidemia, unspecified: Secondary | ICD-10-CM | POA: Diagnosis not present

## 2020-07-11 DIAGNOSIS — I10 Essential (primary) hypertension: Secondary | ICD-10-CM | POA: Diagnosis not present

## 2020-07-11 DIAGNOSIS — M5030 Other cervical disc degeneration, unspecified cervical region: Secondary | ICD-10-CM | POA: Diagnosis not present

## 2020-07-11 DIAGNOSIS — M542 Cervicalgia: Secondary | ICD-10-CM | POA: Diagnosis not present

## 2020-07-11 DIAGNOSIS — G894 Chronic pain syndrome: Secondary | ICD-10-CM | POA: Diagnosis not present

## 2020-07-11 DIAGNOSIS — R7303 Prediabetes: Secondary | ICD-10-CM | POA: Diagnosis not present

## 2020-07-19 ENCOUNTER — Encounter (HOSPITAL_COMMUNITY): Payer: Self-pay | Admitting: Cardiology

## 2020-07-19 ENCOUNTER — Ambulatory Visit (HOSPITAL_COMMUNITY)
Admission: RE | Admit: 2020-07-19 | Discharge: 2020-07-19 | Disposition: A | Discharge status: Home / Self Care | Payer: Medicare Other | Source: Ambulatory Visit | Attending: Cardiology | Admitting: Cardiology

## 2020-07-19 ENCOUNTER — Other Ambulatory Visit: Payer: Self-pay

## 2020-07-19 VITALS — BP 170/90 | HR 59 | Wt 178.6 lb

## 2020-07-19 DIAGNOSIS — Z7902 Long term (current) use of antithrombotics/antiplatelets: Secondary | ICD-10-CM | POA: Insufficient documentation

## 2020-07-19 DIAGNOSIS — Z8249 Family history of ischemic heart disease and other diseases of the circulatory system: Secondary | ICD-10-CM | POA: Diagnosis not present

## 2020-07-19 DIAGNOSIS — I200 Unstable angina: Secondary | ICD-10-CM | POA: Diagnosis not present

## 2020-07-19 DIAGNOSIS — I25119 Atherosclerotic heart disease of native coronary artery with unspecified angina pectoris: Secondary | ICD-10-CM

## 2020-07-19 DIAGNOSIS — I5032 Chronic diastolic (congestive) heart failure: Secondary | ICD-10-CM | POA: Diagnosis not present

## 2020-07-19 DIAGNOSIS — E7849 Other hyperlipidemia: Secondary | ICD-10-CM | POA: Diagnosis not present

## 2020-07-19 DIAGNOSIS — R0683 Snoring: Secondary | ICD-10-CM | POA: Diagnosis not present

## 2020-07-19 DIAGNOSIS — R0609 Other forms of dyspnea: Secondary | ICD-10-CM

## 2020-07-19 DIAGNOSIS — N179 Acute kidney failure, unspecified: Secondary | ICD-10-CM | POA: Insufficient documentation

## 2020-07-19 DIAGNOSIS — Z955 Presence of coronary angioplasty implant and graft: Secondary | ICD-10-CM | POA: Diagnosis not present

## 2020-07-19 DIAGNOSIS — I25118 Atherosclerotic heart disease of native coronary artery with other forms of angina pectoris: Secondary | ICD-10-CM

## 2020-07-19 DIAGNOSIS — Z7982 Long term (current) use of aspirin: Secondary | ICD-10-CM | POA: Diagnosis not present

## 2020-07-19 DIAGNOSIS — R079 Chest pain, unspecified: Secondary | ICD-10-CM | POA: Diagnosis not present

## 2020-07-19 DIAGNOSIS — I2510 Atherosclerotic heart disease of native coronary artery without angina pectoris: Secondary | ICD-10-CM | POA: Diagnosis not present

## 2020-07-19 DIAGNOSIS — E785 Hyperlipidemia, unspecified: Secondary | ICD-10-CM | POA: Diagnosis not present

## 2020-07-19 DIAGNOSIS — I208 Other forms of angina pectoris: Secondary | ICD-10-CM | POA: Diagnosis not present

## 2020-07-19 DIAGNOSIS — Z79899 Other long term (current) drug therapy: Secondary | ICD-10-CM | POA: Insufficient documentation

## 2020-07-19 DIAGNOSIS — I10 Essential (primary) hypertension: Secondary | ICD-10-CM | POA: Diagnosis not present

## 2020-07-19 DIAGNOSIS — I110 Hypertensive heart disease with heart failure: Secondary | ICD-10-CM | POA: Diagnosis not present

## 2020-07-19 DIAGNOSIS — R0600 Dyspnea, unspecified: Secondary | ICD-10-CM | POA: Diagnosis not present

## 2020-07-19 HISTORY — DX: Heart failure, unspecified: I50.9

## 2020-07-19 LAB — BASIC METABOLIC PANEL
Anion gap: 6 (ref 5–15)
BUN: 11 mg/dL (ref 8–23)
CO2: 29 mmol/L (ref 22–32)
Calcium: 9.2 mg/dL (ref 8.9–10.3)
Chloride: 107 mmol/L (ref 98–111)
Creatinine, Ser: 1.22 mg/dL — ABNORMAL HIGH (ref 0.44–1.00)
GFR, Estimated: 47 mL/min — ABNORMAL LOW (ref >60)
Glucose, Bld: 106 mg/dL — ABNORMAL HIGH (ref 70–99)
Potassium: 4.3 mmol/L (ref 3.5–5.1)
Sodium: 142 mmol/L (ref 135–145)

## 2020-07-19 LAB — BRAIN NATRIURETIC PEPTIDE: B Natriuretic Peptide: 191 pg/mL — ABNORMAL HIGH (ref 0.0–100.0)

## 2020-07-19 LAB — LIPID PANEL
Cholesterol: 89 mg/dL (ref 0–200)
HDL: 52 mg/dL (ref >40)
LDL Cholesterol: 10 mg/dL (ref 0–99)
Total CHOL/HDL Ratio: 1.7 RATIO
Triglycerides: 133 mg/dL (ref <150)
VLDL: 27 mg/dL (ref 0–40)

## 2020-07-19 NOTE — Progress Notes (Incomplete)
PCP: [name], MD Cardiology: Dr. [name] HF Cardiology: Dr. Aundra Dubin  75 y.o. with history of CAD, diastolic CHF, and exertional dyspnea was self-referred to CHF clinic.  I take care of her husband, Aryona Sill.  She has struggled with exertional dyspnea for the last several months.  Last echo in 1/19 showed vigorous LV systolic function with a mild intracavitary gradient.  Given exertional dyspnea and chest pressure, she had LHC in 5/21, showing 95% pRCA stenosis that was treated with DES.   Despite PCI, she did not feel much better.  She continued to have exertional dyspnea and chest pressure.  Her chest pressure was nonexertional and happened at random.  Echo was done in 6/21, showing hyperdynamic LV with EF 65-70%, moderate LVH, normal RV.  I increased her Toprol XL to see if this would help her diastolic function and also started her on low dose Lasix 20 mg daily.  Creatinine rose to 2.33 and Lasix was later stopped, creatinine dropped back to 1.08.  She now takes Lasix 20 mg three times a week.   She returns today for followup of CAD, diastolic CHF.  She does ok if she walks at a steady pace on flat ground.  She is short of breath walking up a flight of stairs.  She has had episodes of chest heaviness for 2-3 months.  Symptoms are nonexertional, happen several times a week and may last for about 15 minutes. She has generalized fatigue and snores at night. Weight is down 3 lbs. No orthopnea/PND.  BP is elevated today.  She says that her PCP just prescribed losartan, she has not yet started it.   Labs (5/21): K 4.1, creatinine 1.07. NT-proBNP 721 Labs (6/21): creatinine 2.33 Labs (7/21): LDL 72, K 3.7, creatinine 1.08 Labs (9/21): LDL 93, HDL 40  PMH:  1. Diastolic CHF: Echo in 3/15 with EF 65-70%, mild LV intracavitary gradient, mild LVH, very mild AS, RV size and systolic function normal.   - Echo (6/21): EF 65-70%, moderate LVH, normal RV, mild MR.  2. Hyperlipidemia; Myalgias with  atorvastatin, tolerates low dose Crestor.  3. CAD: LHC in 2017 with moderate nonobstructive disease.  - LHC (5/21): 70% D1, 95% pRCA, 60% mLAD, 60% dLAD. DES to RCA.  4. HTN: Edema with amlodipine.  5. Gastroparesis  ROS: All systems reviewed and negative except as per HPI.   Social History   Socioeconomic History  . Marital status: Married    Spouse name: Not on file  . Number of children: Not on file  . Years of education: Not on file  . Highest education level: Not on file  Occupational History  . Not on file  Tobacco Use  . Smoking status: Never Smoker  . Smokeless tobacco: Never Used  Substance and Sexual Activity  . Alcohol use: No    Alcohol/week: 0.0 standard drinks  . Drug use: No  . Sexual activity: Not on file  Other Topics Concern  . Not on file  Social History Narrative  . Not on file   Social Determinants of Health   Financial Resource Strain: Not on file  Food Insecurity: Not on file  Transportation Needs: Not on file  Physical Activity: Not on file  Stress: Not on file  Social Connections: Not on file  Intimate Partner Violence: Not on file   Family History  Problem Relation Age of Onset  . Hypertension Mother   . Heart disease Mother   . Heart attack Father   .  Stroke Father   . Diabetes Sister   . Hypertension Sister   . Stroke Other   . Diabetes Other   . Heart disease Other    Current Outpatient Medications  Medication Sig Dispense Refill  . aspirin EC 81 MG tablet Take 81 mg by mouth daily.    . clopidogrel (PLAVIX) 75 MG tablet Take 1 tablet (75 mg total) by mouth daily with breakfast. 90 tablet 3  . Cyanocobalamin (B-12) 2000 MCG TABS Take 2,000 mcg by mouth daily.     . DULoxetine (CYMBALTA) 20 MG capsule Take 20 mg by mouth 2 (two) times daily.     Marland Kitchen escitalopram (LEXAPRO) 10 MG tablet Take 10 mg by mouth daily.    . Evolocumab (REPATHA SURECLICK) 166 MG/ML SOAJ Inject 1 pen into the skin every 14 (fourteen) days. 6 mL 3  .  ezetimibe (ZETIA) 10 MG tablet Take 1 tablet (10 mg total) by mouth daily. 90 tablet 3  . fluticasone (FLONASE) 50 MCG/ACT nasal spray Place 2 sprays into both nostrils daily as needed for allergies (seasonal allergies).     . furosemide (LASIX) 20 MG tablet Take 20 mg by mouth 3 (three) times a week.    Marland Kitchen HYDROcodone-acetaminophen (NORCO) 10-325 MG tablet Take 1 tablet by mouth every 4 (four) hours as needed.    . isosorbide mononitrate (IMDUR) 60 MG 24 hr tablet Take 60 mg by mouth daily.    Marland Kitchen levorphanol (LEVODROMORAN) 2 MG tablet Take 2 mg by mouth 3 (three) times daily.     Marland Kitchen LORazepam (ATIVAN) 2 MG tablet Take 2 mg by mouth at bedtime.    Marland Kitchen lubiprostone (AMITIZA) 24 MCG capsule Take 24 mcg by mouth 2 (two) times daily with a meal.     . metoprolol succinate (TOPROL-XL) 100 MG 24 hr tablet Take 1 tablet (100 mg total) by mouth 2 (two) times daily. Take with or immediately following a meal. 180 tablet 6  . nitroGLYCERIN (NITROSTAT) 0.4 MG SL tablet Place 0.4 mg under the tongue every 5 (five) minutes as needed for chest pain.    Marland Kitchen ondansetron (ZOFRAN) 4 MG tablet Take 4 mg by mouth every 8 (eight) hours as needed for nausea or vomiting.    . pantoprazole (PROTONIX) 40 MG tablet Take 1 tablet (40 mg total) by mouth 2 (two) times daily. 30 tablet 11  . rOPINIRole (REQUIP) 1 MG tablet Take 1-2 mg by mouth See admin instructions. Take 2 tablets (2 mg) by mouth daily at bedtime, may also take 1 tablet (1 mg) in the afternoon 15:00 for restless legs    . rosuvastatin (CRESTOR) 10 MG tablet Take 1 tablet (10 mg total) by mouth at bedtime. 90 tablet 3  . tiZANidine (ZANAFLEX) 4 MG tablet Take 4 mg by mouth every 8 (eight) hours as needed for muscle spasms.      No current facility-administered medications for this encounter.   BP (!) 170/90   Pulse (!) 59   Wt 81 kg (178 lb 9.6 oz)   SpO2 100%   BMI 28.83 kg/m  General: NAD Neck: No JVD, no thyromegaly or thyroid nodule.  Lungs: Clear to  auscultation bilaterally with normal respiratory effort. CV: Nondisplaced PMI.  Heart regular S1/S2, no S3/S4, no murmur.  No peripheral edema.  No carotid bruit.  Normal pedal pulses.  Abdomen: Soft, nontender, no hepatosplenomegaly, no distention.  Skin: Intact without lesions or rashes.  Neurologic: Alert and oriented x 3.  Psych: Normal affect.  Extremities: No clubbing or cyanosis.  HEENT: Normal.   Assessment/Plan:  1. Chronic diastolic CHF: Echo in 1/44 showed vigorous LV systolic function EF 81-85% with intracavity gradient.  Echo in 6/21 showed EF 65-70% with moderate LVH, normal RV.  Symptoms significantly improved, now NYHA class II.  She does not appear volume overloaded on exam.  She did well with increase in beta blocker and low dose Lasix (20 mg three times/week).  - Continue Toprol XL 100 mg bid.   - Can continue Imdur 60 mg daily.  - Continue Lasix 20 mg three times/week, check BMET/BNP today.  - Once BP is controlled, will start Jardiance 10 mg daily given diastolic CHF (avoid too many changes at once).  2. Coronary artery disease: DES to RCA in 5/21.  - Continue ASA 81 and Plavix 75 daily.  - Continue Crestor 10 g daily, Repatha, and Zetia 10 mg daily.  Check lipids today.  3. AKI: Creatinine up to 2.33 with Lasix daily, last creatinine in 7/21 down to 1.08.   - BMET today.  4. HTN: BP elevated today.  - Continue Toprol XL 100 mg bid  - PCP has started losartan but she has not yet picked up.  I will have her start losartan then see if additional agents are noted.  -    Followup in 6 months.    Loralie Champagne 07/19/2020

## 2020-07-19 NOTE — Patient Instructions (Addendum)
Labs done today. We will contact you only if your labs are abnormal.  No medication changes were made. Please continue all current medications as prescribed.  Your provider has recommended that you have a home sleep study.  We have provided you with the equipment in our office today. Please download the app and follow the instructions. YOUR PIN NUMBER IS: [id number]. Once you have completed the test you just dispose of the equipment, the information is automatically uploaded to [place] via blue-tooth technology. If your test is positive for sleep apnea and you need a home CPAP machine you will be contacted by Dr Theodosia Blender office [facility]) to set this up.  Your physician has requested that you have a lexiscan myoview. For further information please visit [email or web address]. Please follow instruction sheet, as given. We will contact you to schedule this appointment at a later date.   Your physician has requested that you have a renal artery duplex. During this test, an ultrasound is used to evaluate blood flow to the kidneys. Allow one hour for this exam. Do not eat after midnight the day before and avoid carbonated beverages. Take your medications as you usually do. This has to be approved by your insurance company prior to scheduling, once approved we will contact you to schedule an appointment.  Your physician recommends that you schedule a follow-up appointment in: 10 days for a lab only appointment, 3 weeks with our Clinic Pharmacist, Lauren and in 2 months with Dr. Aundra Dubin  If you have any questions or concerns before your next appointment please send [place] a message through [facility] or call our office at [phone].    TO LEAVE A MESSAGE FOR THE NURSE SELECT OPTION 2, PLEASE LEAVE A MESSAGE INCLUDING: . YOUR NAME . DATE OF BIRTH . CALL BACK NUMBER . REASON FOR CALL**this is important as we prioritize the call backs  YOU WILL RECEIVE A CALL BACK THE SAME DAY AS LONG AS YOU CALL BEFORE 4:00  PM   Do the following things EVERYDAY: 1) Weigh yourself in the morning before breakfast. Write it down and keep it in a log. 2) Take your medicines as prescribed 3) Eat low salt foods--Limit salt (sodium) to 2000 mg per day.  4) Stay as active as you can everyday 5) Limit all fluids for the day to less than 2 liters   At the [facility], you and your health needs are our priority. As part of our continuing mission to provide you with exceptional heart care, we have created designated Provider Care Teams. These Care Teams include your primary Cardiologist (physician) and Advanced Practice Providers (APPs- Physician Assistants and Nurse Practitioners) who all work together to provide you with the care you need, when you need it.   You may see any of the following providers on your designated Care Team at your next follow up: Marland Kitchen Dr Glori Bickers . Dr Loralie Champagne . Darrick Grinder, NP . [name], PA . Audry Riles, PharmD   Please be sure to bring in all your medications bottles to every appointment.

## 2020-07-19 NOTE — Progress Notes (Signed)
PCP: Angela Pepper, MD Cardiology: Dr. [name] HF Cardiology: Dr. Aundra Dubin  75 y.o. with history of CAD, diastolic CHF, and exertional dyspnea was self-referred to CHF clinic.  I take care of her husband, Angela [name].  She has struggled with exertional dyspnea for the last several months.  Last echo in 1/19 showed vigorous LV systolic function with a mild intracavitary gradient.  Given exertional dyspnea and chest pressure, she had LHC in 5/21, showing 95% pRCA stenosis that was treated with DES.   Despite PCI, she did not feel much better.  She continued to have exertional dyspnea and chest pressure.  Her chest pressure was nonexertional and happened at random.  Echo was done in 6/21, showing hyperdynamic LV with EF 65-70%, moderate LVH, normal RV.  I increased her Toprol XL to see if this would help her diastolic function and also started her on low dose Lasix 20 mg daily.  Creatinine rose to 2.33 and Lasix was later stopped, creatinine dropped back to 1.08.  She now takes Lasix 20 mg three times a week.   She returns today for followup of CAD, diastolic CHF.  She does ok if she walks at a steady pace on flat ground.  She is short of breath walking up a flight of stairs.  She has had episodes of chest heaviness for 2-3 months.  Symptoms are nonexertional, happen several times a week and may last for about 15 minutes. She has generalized fatigue and snores at night. Weight is down 3 lbs. No orthopnea/PND.  BP is elevated today.  She says that her PCP just prescribed losartan, she has not yet started it.   Labs (5/21): K 4.1, creatinine 1.07. NT-proBNP 721 Labs (6/21): creatinine 2.33 Labs (7/21): LDL 72, K 3.7, creatinine 1.08 Labs (9/21): LDL 93, HDL 40  PMH:  1. Diastolic CHF: Echo in 9/67 with EF 65-70%, mild LV intracavitary gradient, mild LVH, very mild AS, RV size and systolic function normal.   - Echo (6/21): EF 65-70%, moderate LVH, normal RV, mild MR.  2. Hyperlipidemia; Myalgias with  atorvastatin, tolerates low dose Crestor.  3. CAD: LHC in 2017 with moderate nonobstructive disease.  - LHC (5/21): 70% D1, 95% pRCA, 60% mLAD, 60% dLAD. DES to RCA.  4. HTN: Edema with amlodipine.  5. Gastroparesis  ROS: All systems reviewed and negative except as per HPI.   Social History   Socioeconomic History  . Marital status: Married    Spouse name: Not on file  . Number of children: Not on file  . Years of education: Not on file  . Highest education level: Not on file  Occupational History  . Not on file  Tobacco Use  . Smoking status: Never Smoker  . Smokeless tobacco: Never Used  Substance and Sexual Activity  . Alcohol use: No    Alcohol/week: 0.0 standard drinks  . Drug use: No  . Sexual activity: Not on file  Other Topics Concern  . Not on file  Social History Narrative  . Not on file   Social Determinants of Health   Financial Resource Strain: Not on file  Food Insecurity: Not on file  Transportation Needs: Not on file  Physical Activity: Not on file  Stress: Not on file  Social Connections: Not on file  Intimate Partner Violence: Not on file   Family History  Problem Relation Age of Onset  . Hypertension Mother   . Heart disease Mother   . Heart attack Father   .  Stroke Father   . Diabetes Sister   . Hypertension Sister   . Stroke Other   . Diabetes Other   . Heart disease Other    Current Outpatient Medications  Medication Sig Dispense Refill  . aspirin EC 81 MG tablet Take 81 mg by mouth daily.    . clopidogrel (PLAVIX) 75 MG tablet Take 1 tablet (75 mg total) by mouth daily with breakfast. 90 tablet 3  . Cyanocobalamin (B-12) 2000 MCG TABS Take 2,000 mcg by mouth daily.     . DULoxetine (CYMBALTA) 20 MG capsule Take 20 mg by mouth 2 (two) times daily.     Marland Kitchen escitalopram (LEXAPRO) 10 MG tablet Take 10 mg by mouth daily.    . Evolocumab (REPATHA SURECLICK) 008 MG/ML SOAJ Inject 1 pen into the skin every 14 (fourteen) days. 6 mL 3  .  ezetimibe (ZETIA) 10 MG tablet Take 1 tablet (10 mg total) by mouth daily. 90 tablet 3  . fluticasone (FLONASE) 50 MCG/ACT nasal spray Place 2 sprays into both nostrils daily as needed for allergies (seasonal allergies).     . furosemide (LASIX) 20 MG tablet Take 20 mg by mouth 3 (three) times a week.    Marland Kitchen HYDROcodone-acetaminophen (NORCO) 10-325 MG tablet Take 1 tablet by mouth every 4 (four) hours as needed.    . isosorbide mononitrate (IMDUR) 60 MG 24 hr tablet Take 60 mg by mouth daily.    Marland Kitchen levorphanol (LEVODROMORAN) 2 MG tablet Take 2 mg by mouth 3 (three) times daily.     Marland Kitchen LORazepam (ATIVAN) 2 MG tablet Take 2 mg by mouth at bedtime.    Marland Kitchen lubiprostone (AMITIZA) 24 MCG capsule Take 24 mcg by mouth 2 (two) times daily with a meal.     . metoprolol succinate (TOPROL-XL) 100 MG 24 hr tablet Take 1 tablet (100 mg total) by mouth 2 (two) times daily. Take with or immediately following a meal. 180 tablet 6  . nitroGLYCERIN (NITROSTAT) 0.4 MG SL tablet Place 0.4 mg under the tongue every 5 (five) minutes as needed for chest pain.    Marland Kitchen ondansetron (ZOFRAN) 4 MG tablet Take 4 mg by mouth every 8 (eight) hours as needed for nausea or vomiting.    . pantoprazole (PROTONIX) 40 MG tablet Take 1 tablet (40 mg total) by mouth 2 (two) times daily. 30 tablet 11  . rOPINIRole (REQUIP) 1 MG tablet Take 1-2 mg by mouth See admin instructions. Take 2 tablets (2 mg) by mouth daily at bedtime, may also take 1 tablet (1 mg) in the afternoon 15:00 for restless legs    . rosuvastatin (CRESTOR) 10 MG tablet Take 1 tablet (10 mg total) by mouth at bedtime. 90 tablet 3  . tiZANidine (ZANAFLEX) 4 MG tablet Take 4 mg by mouth every 8 (eight) hours as needed for muscle spasms.      No current facility-administered medications for this encounter.   BP (!) 170/90   Pulse (!) 59   Wt 81 kg (178 lb 9.6 oz)   SpO2 100%   BMI 28.83 kg/m  General: NAD Neck: No JVD, no thyromegaly or thyroid nodule.  Lungs: Clear to  auscultation bilaterally with normal respiratory effort. CV: Nondisplaced PMI.  Heart regular S1/S2, no S3/S4, no murmur.  No peripheral edema.  No carotid bruit.  Normal pedal pulses.  Abdomen: Soft, nontender, no hepatosplenomegaly, no distention.  Skin: Intact without lesions or rashes.  Neurologic: Alert and oriented x 3.  Psych: Normal affect.  Extremities: No clubbing or cyanosis.  HEENT: Normal.   Assessment/Plan:  1. Chronic diastolic CHF: Echo in 5/92 showed vigorous LV systolic function EF 92-44% with intracavity gradient.  Echo in 6/21 showed EF 65-70% with moderate LVH, normal RV.  Symptoms significantly improved, now NYHA class II.  She does not appear volume overloaded on exam.  She did well with increase in beta blocker and low dose Lasix (20 mg three times/week).  - Continue Toprol XL 100 mg bid.   - Can continue Imdur 60 mg daily.  - Continue Lasix 20 mg three times/week, check BMET/BNP today.  - Once BP is controlled, will start Jardiance 10 mg daily given diastolic CHF (avoid too many changes at once).  2. Coronary artery disease: DES to RCA in 5/21. She has been having atypical chest tightness for the last couple months, says it feels like symptoms prior to last PCI.  - I will arrange for Lexiscan Cardiolite for risk stratification.  - Continue ASA 81 and Plavix 75 daily.  - Continue Crestor 10 g daily, Repatha, and Zetia 10 mg daily.  Check lipids today.  3. AKI: Creatinine up to 2.33 with Lasix daily, last creatinine in 7/21 down to 1.08.   - BMET today.  4. HTN: BP elevated today.  - Continue Toprol XL 100 mg bid  - PCP has started losartan but she has not yet picked up.  I will have her start losartan then see if additional agents are needed (BMET 10 days after starting losartan).  Check BP daily once losartan is started, bring readings to appt with clinical pharmacist.  - Check renal artery dopplers to assess for renal artery stenosis.  5. Suspect OSA: Daytime fatigue  and snoring.  - Arrange for home sleep study.   Followup in 3 wks with clinical pharmacist for BP medication titration and 2 months with me.    Angela [name] 07/19/2020

## 2020-07-26 ENCOUNTER — Telehealth (HOSPITAL_COMMUNITY): Payer: Self-pay | Admitting: Surgery

## 2020-07-26 NOTE — Telephone Encounter (Signed)
I called patient and left a message for return call.  She can now complete the home sleep study test as insurance prior authorization is not needed.

## 2020-07-27 ENCOUNTER — Telehealth: Payer: Self-pay | Admitting: Pharmacist

## 2020-07-27 NOTE — Telephone Encounter (Signed)
Called pt and reviewed her recent lipid panel. LDL down to 10. Advised patient may stop zetia, but continue both rosuvastatin and Repatha. Can recheck 4/18 @ Dr. Claris Gladden office.

## 2020-07-30 ENCOUNTER — Other Ambulatory Visit (HOSPITAL_COMMUNITY): Payer: Medicare Other

## 2020-07-31 ENCOUNTER — Other Ambulatory Visit: Payer: Self-pay

## 2020-07-31 ENCOUNTER — Ambulatory Visit (HOSPITAL_COMMUNITY)
Admission: RE | Admit: 2020-07-31 | Discharge: 2020-07-31 | Disposition: A | Discharge status: Home / Self Care | Payer: Medicare Other | Source: Ambulatory Visit | Attending: Cardiology | Admitting: Cardiology

## 2020-07-31 DIAGNOSIS — I10 Essential (primary) hypertension: Secondary | ICD-10-CM | POA: Diagnosis not present

## 2020-07-31 NOTE — Progress Notes (Incomplete)
***In Progress*** PCP: [name], MD Cardiology: Dr. [name] HF Cardiology: Dr. Aundra Dubin  HPI:  75 y.o. with history of CAD, diastolic CHF, and exertional dyspnea was self-referred to CHF clinic. She has struggled with exertional dyspnea for the last several months.  Last echo in 06/2017 showed vigorous LV systolic function with a mild intracavitary gradient.  Given exertional dyspnea and chest pressure, she had LHC in 5/21, showing 95% pRCA stenosis that was treated with DES.   Despite PCI, she did not feel much better.  She continued to have exertional dyspnea and chest pressure.  Her chest pressure was nonexertional and happened at random.  Echo was done in 11/2019, showing hyperdynamic LV with EF 65-70%, moderate LVH, normal RV.  Her metoprolol succinate was increased to see if this would help her diastolic function and she was also started on low dose furosemide 20 mg daily.  Creatinine rose to 2.33 and furosemide was later stopped. Creatinine dropped back to 1.08.  She now takes furosemide 20 mg three times a week.   She recently returned to HF Clinic for followup of CAD and diastolic CHF on 31/51/76.  She reported doing ok if she walks at a steady pace on flat ground.  She stated she gets short of breath walking up a flight of stairs.  She had had episodes of chest heaviness for 2-3 months.  Symptoms were nonexertional, happen several times a week and lasted for about 15 minutes. She had generalized fatigue and reported snoring at night. Weight was down 3 lbs. No orthopnea/PND.  BP was elevated in clinic.  She stated that her PCP had just prescribed losartan, but she had not yet started it.   Today he returns to HF clinic for pharmacist medication titration. At last visit with MD, she was instructed to start the losartan 50 mg daily that her PCP had recently prescribed.  . Shortness of breath/dyspnea on exertion? {YES [id number]  . Orthopnea/PND? {YES [id number] . Edema? {YES  [id number] . Lightheadedness/dizziness? {YES [id number] . Daily weights at home? {YES [id number] . Blood pressure/heart rate monitoring at home? {YES [id number] . Following low-sodium/fluid-restricted diet? {YES [id number]  HF/HTN Medications: Metoprolol succinate 100 mg BID Isosorbide mononitrate 60 mg daily Furosemide 20 mg three times per week  Has the patient been experiencing any side effects to the medications prescribed?  {YES NO:22349}  Does the patient have any problems obtaining medications due to transportation or finances?   {YES NO:22349}  Understanding of regimen: {excellent/good/fair/poor:19665} Understanding of indications: {excellent/good/fair/poor:19665} Potential of compliance: {excellent/good/fair/poor:19665} Patient understands to avoid NSAIDs. Patient understands to avoid decongestants.    Pertinent Lab Values: . Serum creatinine ***, BUN ***, Potassium ***, Sodium ***, BNP ***, Magnesium ***, Digoxin ***   Vital Signs: . Weight: *** (last clinic weight: ***) . Blood pressure: ***  . Heart rate: ***   Assessment/Plan: 1. Chronic diastolic CHF: Echo in 1/60 showed vigorous LV systolic function EF 73-71% with intracavity gradient.  Echo in 6/21 showed EF 65-70% with moderate LVH, normal RV.   - Symptoms significantly improved, now NYHA class II.  She does not appear volume overloaded on exam. - Continue furosemide 20 mg three times/week.  - Continue metoprolol succinate 100 mg BID.   - Continue isosorbide mononitrate 60 mg daily.  - Once BP is controlled, will start Jardiance 10 mg daily given diastolic CHF (avoid too many changes at once).  2. Coronary artery disease: DES to RCA in 5/21. She has been having atypical chest tightness for  the last couple months, says it feels like symptoms prior to last PCI.  - I will arrange for Lexiscan Cardiolite for risk stratification.  - Continue ASA 81 and Plavix 75 daily.  - Continue Crestor 10 g daily and Repatha mg  daily. Zetia recently discontinued for LDL of 10.   3. AKI: Creatinine up to 2.33 with furosemide daily, last creatinine in 07/19/20 down to 1.22.   - BMET today.  4. HTN: BP elevated today.  - Continue metoprolol succinate 100 mg BID - Continue Losartan 50 mg daily - Check renal artery dopplers to assess for renal artery stenosis.  5. Suspect OSA: Daytime fatigue and snoring.  - Previously referred for home sleep study.    Audry Riles, PharmD, BCPS, BCCP, CPP Heart Failure Clinic Pharmacist [phone]

## 2020-08-01 ENCOUNTER — Telehealth (HOSPITAL_COMMUNITY): Payer: Self-pay | Admitting: Surgery

## 2020-08-01 ENCOUNTER — Telehealth (HOSPITAL_COMMUNITY): Payer: Self-pay | Admitting: Cardiology

## 2020-08-01 NOTE — Telephone Encounter (Signed)
I called patient and left a message to remind Angela [name] to complete the home sleep study as ordered by Dr. Aundra Dubin.

## 2020-08-01 NOTE — Telephone Encounter (Signed)
Patient called and cancelled Myoview for 08/02/20. She states she is taking her spouse to the ER and will call us back at a later date to reschedule. Order will be removed from the [facility] and when she calls back to reschedule we will reinstate the order. Thank you.

## 2020-08-02 ENCOUNTER — Encounter (HOSPITAL_COMMUNITY): Payer: Medicare Other

## 2020-08-05 ENCOUNTER — Encounter (HOSPITAL_COMMUNITY): Payer: Self-pay

## 2020-08-06 ENCOUNTER — Telehealth (HOSPITAL_COMMUNITY): Payer: Self-pay | Admitting: Surgery

## 2020-08-06 ENCOUNTER — Encounter (HOSPITAL_COMMUNITY): Payer: Self-pay | Admitting: *Deleted

## 2020-08-06 DIAGNOSIS — I10 Essential (primary) hypertension: Secondary | ICD-10-CM

## 2020-08-06 NOTE — Telephone Encounter (Signed)
I spoke to patient regarding her sleep study.  She tells me that she has completed the study and unfortunately the exam has not resulted on [employer]. I have reached out to [facility] and await information.  Also she has questions regarding labwork to be completed and a missed stress test.   She is concerned about her blood pressure "running high" --170-180's/ 120's and ability to reschedule the stress test.  She does have a Pharmacy appointment on Thursday and I will schedule labwork at that time.

## 2020-08-06 NOTE — Telephone Encounter (Signed)
Needs her stress test.   Have her start amlodipine 5 mg daily.  Make sure she has followup.

## 2020-08-07 DIAGNOSIS — Z79891 Long term (current) use of opiate analgesic: Secondary | ICD-10-CM | POA: Diagnosis not present

## 2020-08-07 DIAGNOSIS — M542 Cervicalgia: Secondary | ICD-10-CM | POA: Diagnosis not present

## 2020-08-07 DIAGNOSIS — Z79899 Other long term (current) drug therapy: Secondary | ICD-10-CM | POA: Diagnosis not present

## 2020-08-07 DIAGNOSIS — G894 Chronic pain syndrome: Secondary | ICD-10-CM | POA: Diagnosis not present

## 2020-08-07 DIAGNOSIS — M5030 Other cervical disc degeneration, unspecified cervical region: Secondary | ICD-10-CM | POA: Diagnosis not present

## 2020-08-07 DIAGNOSIS — M961 Postlaminectomy syndrome, not elsewhere classified: Secondary | ICD-10-CM | POA: Diagnosis not present

## 2020-08-08 NOTE — Telephone Encounter (Signed)
Have been unable to reach pt via phone, will discuss at pharmacy appt 3/10

## 2020-08-09 ENCOUNTER — Other Ambulatory Visit (HOSPITAL_COMMUNITY): Payer: Medicare Other

## 2020-08-09 ENCOUNTER — Inpatient Hospital Stay (HOSPITAL_COMMUNITY): Admission: RE | Admit: 2020-08-09 | Payer: Medicare Other | Source: Ambulatory Visit

## 2020-08-09 NOTE — Progress Notes (Addendum)
PCP: [name], MD Cardiology: Dr. [name] HF Cardiology: Dr. Aundra Dubin  HPI:  75 y.o. with history of CAD, diastolic CHF, and exertional dyspnea was self-referred to CHF clinic. She has struggled with exertional dyspnea for the last several months.  Last echo in 06/2017 showed vigorous LV systolic function with a mild intracavitary gradient.  Given exertional dyspnea and chest pressure, she had LHC in 10/2019, showing 95% pRCA stenosis that was treated with DES.   Despite PCI, she did not feel much better.  She continued to have exertional dyspnea and chest pressure.  Her chest pressure was nonexertional and happened at random.  Echo was done in 11/2019, showing hyperdynamic LV with EF 65-70%, moderate LVH, normal RV.  Her metoprolol succinate was increased to see if this would help her diastolic function and she was also started on low dose furosemide 20 mg daily.  Creatinine rose to 2.33 and furosemide was later stopped. Creatinine dropped back to 1.08.  She now takes furosemide 20 mg three times a week.   She recently returned to HF Clinic for followup of CAD and diastolic CHF on 15/17/61.  She reported doing ok if she walks at a steady pace on flat ground.  She stated she gets short of breath walking up a flight of stairs.  She had had episodes of chest heaviness for 2-3 months.  Symptoms were nonexertional, happen several times a week and lasted for about 15 minutes. She had generalized fatigue and reported snoring at night. Weight was down 3 lbs. No orthopnea/PND.  BP was elevated in clinic.  She stated that her PCP had just prescribed losartan, but she had not yet started it.   At last visit with MD on 07/19/20, patient was instructed to start the losartan 50 mg daily that her PCP had recently prescribed. Additionally, amlodipine 5 mg daily was re-initiated on 08/06/20 for BP of 170-180/120s at home. On 09/06/20, patient complained of ankle and feet edema after starting amlodipine. Patient reported  a history of severe edema on amlodipine and was taken off of it 15 years ago. Patient was instructed to stop amlodipine again this time.  Today she returns to HF clinic for pharmacist medication titration. Patient reports she is feeling much better ever since starting losartan. She had previously been experiencing headaches since catching COVID-19 infection in 06/2020, but felt better as her BP improved on losartan. After starting amlodipine, patient reported LEE and flushing. She stopped amlodipine 4-5 days ago and swelling has declined since then, with some mild residual swelling in her feet on exam. Patient reports some dizziness/lightheadedness if she stands up too quickly, but this is her baseline. She continues to experience SOB when climbing stairs but is able to walk on flat ground without difficulty. She reports trying to eat less salt but knows she is not doing as well as she should. She has not taken her meds this morning yet.  HF/HTN Medications: Metoprolol succinate 100 mg BID Losartan 50 mg daily Isosorbide mononitrate 60 mg daily Furosemide 20 mg three times per week  Has the patient been experiencing any side effects to the medications prescribed?  No   Does the patient have any problems obtaining medications due to transportation or finances?   No, patient has [employer]. Provided patient with 30-day free trial of Entresto today.   Understanding of regimen: excellent Understanding of indications: excellent Potential of compliance: excellent Patient understands to avoid NSAIDs. Patient understands to avoid decongestants.    Pertinent Lab Values: .  07/19/20: Serum creatinine 1.22, Potassium 4.3 . 09/11/20: Serum creatinine 1.13, Potassium 3.6  Vital Signs: . Weight: 182 lbs (last clinic weight: 178 lbs) . Blood pressure: 152/78  . Heart rate: 64   Assessment/Plan: 1. Chronic diastolic CHF: Echo in 4/16 showed vigorous LV systolic function EF 38-45% with intracavity gradient.   Echo in 11/2019 showed EF 65-70% with moderate LVH, normal RV.   - Symptoms significantly improved, now NYHA class II.  She has mild swelling in her feet today which has improved since stopping amlodipine. - Continue furosemide 20 mg three times weekly.  - Stop losartan. Start Entresto 49/51 mg BID. BMET stable today.  - Continue metoprolol succinate 100 mg BID.  - Continue isosorbide mononitrate 60 mg daily.  2. Coronary artery disease: DES to RCA in 10/2019. She had been having atypical chest tightness for the last couple months. Previously stated it felt like symptoms prior to last PCI.  - Had recent Lexiscan Cardiolite for risk stratification.  - Continue ASA 81 and Plavix 75 daily.  - Continue Crestor 10 g daily and Repatha mg daily. Zetia recently discontinued for LDL of 10.   3. AKI: Resolved - Creatinine down to 1.13 today.  4. HTN: BP elevated today at 152/78. - Continue metoprolol succinate 100 mg BID - Stop losartan. Start Entresto 49/51 mg BID. - No renal artery stenosis on 07/31/20. 5. Suspect OSA: Daytime fatigue and snoring.  - Previously referred for home sleep study.   Patient will follow up with Dr. Aundra Dubin 09/17/20. Plan to repeat BMET then.    Esmeralda Links (PharmD Candidate 2022)  Audry Riles, PharmD, BCPS, BCCP, CPP Heart Failure Clinic Pharmacist [phone]

## 2020-08-10 ENCOUNTER — Encounter (HOSPITAL_COMMUNITY): Payer: Self-pay

## 2020-08-10 MED ORDER — AMLODIPINE BESYLATE 5 MG PO TABS: 5.0000 mg | ORAL_TABLET | Freq: Every day | ORAL | 3 refills | Status: DC

## 2020-08-10 NOTE — Telephone Encounter (Signed)
Pt made aware via mychart message. Amlodipine sent to pharmacy.

## 2020-08-10 NOTE — Addendum Note (Signed)
Addended by: Harvie Junior on: 08/10/2020 02:13 PM   Modules accepted: Orders

## 2020-08-13 ENCOUNTER — Telehealth (HOSPITAL_COMMUNITY): Payer: Self-pay | Admitting: *Deleted

## 2020-08-13 ENCOUNTER — Other Ambulatory Visit (HOSPITAL_COMMUNITY): Payer: Self-pay | Admitting: *Deleted

## 2020-08-13 DIAGNOSIS — I10 Essential (primary) hypertension: Secondary | ICD-10-CM | POA: Diagnosis not present

## 2020-08-13 MED ORDER — AMLODIPINE BESYLATE 5 MG PO TABS: 5.0000 mg | ORAL_TABLET | Freq: Every day | ORAL | 3 refills | Status: DC

## 2020-08-13 MED ORDER — AMLODIPINE BESYLATE 5 MG PO TABS
5.0000 mg | ORAL_TABLET | Freq: Every day | ORAL | 3 refills | Status: DC
Start: 2020-08-13 — End: 2020-09-10

## 2020-08-13 NOTE — Telephone Encounter (Signed)
Pt states she does not recall any issues with Amlodipine in the past and states it was stopped during a hospitalization when she was dehydrated with low BP. She wants to start back on this medication and if notices any swelling or issues she will let us know, per Dr Aundra Dubin ok to proceed with this plan.  [facility] aware ok to fill RX

## 2020-08-13 NOTE — Telephone Encounter (Signed)
Received fax from [facility], they received RX for amlodipine however it is on pt's allergy list so need conformation ok to fill  Per chart Amlodipine was stopped in June 2020 and added to allergy list for "swelling" however there is no mention of doing this or why. Attempted to call pt for more info and Left message to call back

## 2020-08-16 ENCOUNTER — Telehealth (HOSPITAL_COMMUNITY): Payer: Self-pay | Admitting: *Deleted

## 2020-08-16 NOTE — Telephone Encounter (Signed)
Patient given detailed instructions per Myocardial Perfusion Study Information Sheet for the test on 08/20/20 at 10:15. Patient notified to arrive 15 minutes early and that it is imperative to arrive on time for appointment to keep from having the test rescheduled.  If you need to cancel or reschedule your appointment, please call the office within 24 hours of your appointment. . Patient verbalized understanding.Angela [name]

## 2020-08-17 ENCOUNTER — Telehealth (HOSPITAL_COMMUNITY): Payer: Self-pay | Admitting: Surgery

## 2020-08-17 NOTE — Telephone Encounter (Signed)
I called and left a message regarding home sleep study.  There was insufficient data to interpret and therefore I am requesting that she repeat the home study.  I have asked her to return my call so that we can determine how to get her a device.

## 2020-08-20 ENCOUNTER — Ambulatory Visit (HOSPITAL_COMMUNITY): Payer: Medicare Other | Attending: Cardiology

## 2020-08-20 ENCOUNTER — Other Ambulatory Visit: Payer: Self-pay

## 2020-08-20 DIAGNOSIS — R079 Chest pain, unspecified: Secondary | ICD-10-CM | POA: Diagnosis not present

## 2020-08-20 LAB — MYOCARDIAL PERFUSION IMAGING
LV dias vol: 81 mL (ref 46–106)
LV sys vol: 23 mL
Peak HR: 88 {beats}/min
Rest HR: 65 {beats}/min
SDS: 2
SRS: 4
SSS: 7
TID: 1.03

## 2020-08-20 MED ORDER — TECHNETIUM TC 99M TETROFOSMIN IV KIT
32.4000 | PACK | Freq: Once | INTRAVENOUS | Status: AC | PRN
  Administered 2020-08-20: 32.4 via INTRAVENOUS
  Filled 2020-08-20: qty 33

## 2020-08-20 MED ORDER — TECHNETIUM TC 99M TETROFOSMIN IV KIT
10.7000 | PACK | Freq: Once | INTRAVENOUS | Status: AC | PRN
  Administered 2020-08-20: 10.7 via INTRAVENOUS
  Filled 2020-08-20: qty 11

## 2020-08-20 MED ORDER — REGADENOSON 0.4 MG/5ML IV SOLN
0.4000 mg | Freq: Once | INTRAVENOUS | Status: AC
Start: 2020-08-20 — End: 2020-08-20
  Administered 2020-08-20: 0.4 mg via INTRAVENOUS

## 2020-08-27 ENCOUNTER — Encounter (INDEPENDENT_AMBULATORY_CARE_PROVIDER_SITE_OTHER): Payer: Medicare Other | Admitting: Cardiology

## 2020-08-27 DIAGNOSIS — G4733 Obstructive sleep apnea (adult) (pediatric): Secondary | ICD-10-CM

## 2020-08-29 ENCOUNTER — Ambulatory Visit: Payer: Medicare Other

## 2020-08-29 DIAGNOSIS — R0683 Snoring: Secondary | ICD-10-CM

## 2020-08-29 NOTE — Procedures (Signed)
   Sleep Study Report  Patient Information  Name: Angela [name] ID: [id number] Birth Date: 03-10-1946  Age: 75  Gender: Female Study Date:08/27/3020 Referring Physician:  Loralie Champagne, MD  TEST DESCRIPTION: Home sleep apnea testing was completed using the WatchPat, a Type 1 device, utilizing  peripheral arterial tonometry (PAT), chest movement, actigraphy, pulse oximetry, pulse rate, body position and snore.  AHI was calculated with apnea and hypopnea using valid sleep time as the denominator. RDI includes apneas,  hypopneas, and RERAs. The data acquired and the scoring of sleep and all associated events were performed in  accordance with the recommended standards and specifications as outlined in the AASM Manual for the Scoring of  Sleep and Associated Events 2.2.0 (2015).  FINDINGS: 1. Severe Obstructive Sleep Apnea with AHI 30.9/hr. 2. Minimal Central Sleep Apnea with pAHIc 1.2/hr. 3. Oxygen desaturations as low as 71%. 4. Mild snoring was present. O2 sats were < 88% for 73min. 5. Total sleep time was 7 hrs and 37 min. 6. 19.7% of total sleep time was spent in REM sleep.  7. Normal sleep onset latency at 20 min.  8. Prolonged REM sleep onset latency at 111 min.  9. Total awakenings were 4.   DIAGNOSIS:  Severe Obstructive Sleep Apnea (G47.33) Nocturnal Hypoxemia  RECOMMENDATIONS:  1. Clinical correlation of these findings is necessary. The decision to treat obstructive sleep apnea (OSA) is usually  based on the presence of apnea symptoms or the presence of associated medical conditions such as Hypertension,  Congestive Heart Failure, Atrial Fibrillation or Obesity. The most common symptoms of OSA are snoring, gasping for  breath while sleeping, daytime sleepiness and fatigue.   2. Initiating apnea therapy is recommended given the presence of symptoms and/or associated conditions.   Recommend proceeding with one of the following:   a. Auto-CPAP therapy with a  pressure range of 5-20cm H2O.   b. An oral appliance (OA) that can be obtained from certain dentists with expertise in sleep medicine. These are  primarily of use in non-obese patients with mild and moderate disease.   c. An ENT consultation which may be useful to look for specific causes of obstruction and possible treatment  Options.   d. If patient is intolerant to PAP therapy, consider referral to ENT for evaluation for hypoglossal nerve stimulator.   3. Close follow-up is necessary to ensure success with CPAP or oral appliance therapy for maximum benefit .  4. A follow-up oximetry study on CPAP is recommended to assess the adequacy of therapy and determine the need  for supplemental oxygen or the potential need for Bi-level therapy. An arterial blood gas to determine the adequacy of  baseline ventilation and oxygenation should also be considered.  5. Healthy sleep recommendations include: adequate nightly sleep (normal 7-9 hrs/night), avoidance of caffeine after  noon and alcohol near bedtime, and maintaining a sleep environment that is cool, dark and quiet.  6. Weight loss for overweight patients is recommended. Even modest amounts of weight loss can significantly  improve the severity of sleep apnea.  7. Snoring recommendations include: weight loss where appropriate, side sleeping, and avoidance of alcohol before  Bed.  8. Operation of motor vehicle or dangerous equipment must be avoided when feeling drowsy, excessively sleepy, or  mentally fatigued.   Report prepared by: Signature: Fransico Him, MD [facility], [place] Board of Sleep Medicine   Electronically Signed: Aug 29, 2020

## 2020-08-31 ENCOUNTER — Other Ambulatory Visit (HOSPITAL_COMMUNITY): Payer: Self-pay | Admitting: Cardiology

## 2020-09-06 ENCOUNTER — Encounter (HOSPITAL_COMMUNITY): Payer: Self-pay

## 2020-09-06 DIAGNOSIS — G894 Chronic pain syndrome: Secondary | ICD-10-CM | POA: Diagnosis not present

## 2020-09-06 DIAGNOSIS — M961 Postlaminectomy syndrome, not elsewhere classified: Secondary | ICD-10-CM | POA: Diagnosis not present

## 2020-09-06 DIAGNOSIS — M25519 Pain in unspecified shoulder: Secondary | ICD-10-CM | POA: Diagnosis not present

## 2020-09-06 DIAGNOSIS — M797 Fibromyalgia: Secondary | ICD-10-CM | POA: Diagnosis not present

## 2020-09-07 ENCOUNTER — Telehealth (HOSPITAL_COMMUNITY): Payer: Self-pay | Admitting: *Deleted

## 2020-09-07 NOTE — Telephone Encounter (Addendum)
Called pt to advise. Received recording that my call can not be completed at this time. Attempted 3 more times and was un able to reach pt. Message sent via mychart.     Larey Dresser, MD  You 5 hours ago (11:21 AM)     Have her stop Norvasc. If she is taking losartan 50 mg daily, stop this and start Entresto 40/81 bid (has diastolic CHF as indication). She will need BMET 10 days. Have her check BP daily on the new med and let us know what it runs.    Message text    You routed conversation to Larey Dresser, MD 6 hours ago (9:53 AM)   Ted Mcalpine "Angela [name]"  Larey Dresser, MD 17 hours ago (11:20 PM)    Dr. Aundra Dubin I have started on the Norvasc 5mg  for about the past 10 days.  It has helped my b/p.  I'm running about 150ish over high 90s.  The problem I now have is  2-3+ ankle and feet edema and flushing.  I have been on Norvasc before.  appx 15 yrs ago and My MD had to take me off due to severe edema.  Could you please d/c the Norvasc and order something else?  It would be best to order 30 days worth from our [facility] and if that works then a 3 month supply from [employer].  Thanks so much Angela [name]

## 2020-09-10 NOTE — Addendum Note (Signed)
Addended by: Harvie Junior on: 09/10/2020 04:19 PM   Modules accepted: Orders

## 2020-09-10 NOTE — Telephone Encounter (Signed)
Spoke with pt she is aware to stop norvasc. Pt has pharmacy appt tomorrow will discuss entresto vs losartan increase based on insurance coverage per Lauren Kemp,RPH-CPP.

## 2020-09-10 NOTE — Progress Notes (Incomplete)
***In Progress*** PCP: [name], MD Cardiology: Dr. [name] HF Cardiology: Dr. Aundra Dubin  HPI:  75 y.o. with history of CAD, diastolic CHF, and exertional dyspnea was self-referred to CHF clinic. She has struggled with exertional dyspnea for the last several months.  Last echo in 06/2017 showed vigorous LV systolic function with a mild intracavitary gradient.  Given exertional dyspnea and chest pressure, she had LHC in 10/2019, showing 95% pRCA stenosis that was treated with DES.   Despite PCI, she did not feel much better.  She continued to have exertional dyspnea and chest pressure.  Her chest pressure was nonexertional and happened at random.  Echo was done in 11/2019, showing hyperdynamic LV with EF 65-70%, moderate LVH, normal RV.  Her metoprolol succinate was increased to see if this would help her diastolic function and she was also started on low dose furosemide 20 mg daily.  Creatinine rose to 2.33 and furosemide was later stopped. Creatinine dropped back to 1.08.  She now takes furosemide 20 mg three times a week.   She recently returned to HF Clinic for followup of CAD and diastolic CHF on 78/93/81.  She reported doing ok if she walks at a steady pace on flat ground.  She stated she gets short of breath walking up a flight of stairs.  She had had episodes of chest heaviness for 2-3 months.  Symptoms were nonexertional, happen several times a week and lasted for about 15 minutes. She had generalized fatigue and reported snoring at night. Weight was down 3 lbs. No orthopnea/PND.  BP was elevated in clinic.  She stated that her PCP had just prescribed losartan, but she had not yet started it.   At last visit with MD, patient was instructed to start the losartan 50 mg daily that her PCP had recently prescribed. Additionally, amlodipine 5 mg daily was re-initiated on 08/06/20 for BP of 170-180/120s at home. On 09/06/20, patient complained of ankle and feet edema after starting amlodipine. Patient  reported a history of severe edema on amlodipine and was taken off of it 15 years ago. MD planned to stop amlodipine and losartan and start Entresto 49/51 mg BID. Clinic was unable to reach patient via phone call to discuss changes.  Today she returns to HF clinic for pharmacist medication titration. ***    Overall feeling ***. Dizziness, lightheadedness, fatigue:  Chest pain or palpitations:  How is your breathing?: *** SOB: Able to complete all ADLs. Activity level ***  Weight at home pounds. Takes furosemide/torsemide/bumex *** mg *** daily.  LEE PND/Orthopnea  Appetite *** Low-salt diet:   Physical Exam Cost/affordability of meds    -get BMET today (none since starting losartan) -stop losartan and start Entresto 49/51 BID, check BMET on 4/18 (prior auth, PAP) -start Farxiga 10 mg daily (prior auth, PAP) -follow up on 4/18 with Dr. Aundra Dubin    HF/HTN Medications: Metoprolol succinate 100 mg BID Losartan 50 mg daily Isosorbide mononitrate 60 mg daily Furosemide 20 mg three times per week  Has the patient been experiencing any side effects to the medications prescribed?  {YES NO:22349}  Does the patient have any problems obtaining medications due to transportation or finances?   {YES NO:22349}  Understanding of regimen: {excellent/good/fair/poor:19665} Understanding of indications: {excellent/good/fair/poor:19665} Potential of compliance: {excellent/good/fair/poor:19665} Patient understands to avoid NSAIDs. Patient understands to avoid decongestants.    Pertinent Lab Values: . Serum creatinine ***, BUN ***, Potassium ***, Sodium ***, BNP ***, Magnesium ***, Digoxin ***   Vital Signs: . Weight: *** (  last clinic weight: ***) . Blood pressure: ***  . Heart rate: ***   Assessment/Plan: 1. Chronic diastolic CHF: Echo in 4/09 showed vigorous LV systolic function EF 81-19% with intracavity gradient.  Echo in 6/21 showed EF 65-70% with moderate LVH, normal RV.   -  Symptoms significantly improved, now NYHA class II.  She does not appear volume overloaded on exam. - Continue furosemide 20 mg three times/week.  - Continue metoprolol succinate 100 mg BID.   - Continue isosorbide mononitrate 60 mg daily.  - Once BP is controlled, will start Jardiance 10 mg daily given diastolic CHF (avoid too many changes at once).  2. Coronary artery disease: DES to RCA in 5/21. She has been having atypical chest tightness for the last couple months, says it feels like symptoms prior to last PCI.  - I will arrange for Lexiscan Cardiolite for risk stratification.  - Continue ASA 81 and Plavix 75 daily.  - Continue Crestor 10 g daily and Repatha mg daily. Zetia recently discontinued for LDL of 10.   3. AKI: Creatinine up to 2.33 with furosemide daily, last creatinine in 07/19/20 down to 1.22.   - BMET today.  4. HTN: BP elevated today.  - Continue metoprolol succinate 100 mg BID - Continue Losartan 50 mg daily - Check renal artery dopplers to assess for renal artery stenosis.  5. Suspect OSA: Daytime fatigue and snoring.  - Previously referred for home sleep study.    Audry Riles, PharmD, BCPS, BCCP, CPP Heart Failure Clinic Pharmacist [phone]

## 2020-09-11 ENCOUNTER — Other Ambulatory Visit (HOSPITAL_COMMUNITY): Payer: Self-pay

## 2020-09-11 ENCOUNTER — Ambulatory Visit (HOSPITAL_COMMUNITY): Admission: RE | Admit: 2020-09-11 | Payer: Medicare Other | Source: Ambulatory Visit

## 2020-09-11 ENCOUNTER — Other Ambulatory Visit: Payer: Self-pay

## 2020-09-11 ENCOUNTER — Encounter (HOSPITAL_COMMUNITY): Payer: Self-pay

## 2020-09-11 ENCOUNTER — Telehealth: Payer: Self-pay | Admitting: *Deleted

## 2020-09-11 ENCOUNTER — Ambulatory Visit (HOSPITAL_COMMUNITY)
Admission: RE | Admit: 2020-09-11 | Discharge: 2020-09-11 | Disposition: A | Discharge status: Home / Self Care | Payer: Medicare Other | Source: Ambulatory Visit | Attending: Cardiology | Admitting: Cardiology

## 2020-09-11 DIAGNOSIS — I5032 Chronic diastolic (congestive) heart failure: Secondary | ICD-10-CM | POA: Insufficient documentation

## 2020-09-11 DIAGNOSIS — I200 Unstable angina: Secondary | ICD-10-CM

## 2020-09-11 DIAGNOSIS — I110 Hypertensive heart disease with heart failure: Secondary | ICD-10-CM | POA: Insufficient documentation

## 2020-09-11 DIAGNOSIS — Z79899 Other long term (current) drug therapy: Secondary | ICD-10-CM | POA: Insufficient documentation

## 2020-09-11 DIAGNOSIS — Z7902 Long term (current) use of antithrombotics/antiplatelets: Secondary | ICD-10-CM | POA: Diagnosis not present

## 2020-09-11 DIAGNOSIS — I25110 Atherosclerotic heart disease of native coronary artery with unstable angina pectoris: Secondary | ICD-10-CM | POA: Insufficient documentation

## 2020-09-11 DIAGNOSIS — Z7982 Long term (current) use of aspirin: Secondary | ICD-10-CM | POA: Diagnosis not present

## 2020-09-11 DIAGNOSIS — G4733 Obstructive sleep apnea (adult) (pediatric): Secondary | ICD-10-CM

## 2020-09-11 DIAGNOSIS — Z8616 Personal history of COVID-19: Secondary | ICD-10-CM | POA: Insufficient documentation

## 2020-09-11 LAB — BASIC METABOLIC PANEL
Anion gap: 7 (ref 5–15)
BUN: 17 mg/dL (ref 8–23)
CO2: 27 mmol/L (ref 22–32)
Calcium: 8.8 mg/dL — ABNORMAL LOW (ref 8.9–10.3)
Chloride: 106 mmol/L (ref 98–111)
Creatinine, Ser: 1.13 mg/dL — ABNORMAL HIGH (ref 0.44–1.00)
GFR, Estimated: 51 mL/min — ABNORMAL LOW (ref >60)
Glucose, Bld: 119 mg/dL — ABNORMAL HIGH (ref 70–99)
Potassium: 3.6 mmol/L (ref 3.5–5.1)
Sodium: 140 mmol/L (ref 135–145)

## 2020-09-11 MED ORDER — SACUBITRIL-VALSARTAN 49-51 MG PO TABS: 1.0000 | ORAL_TABLET | Freq: Two times a day (BID) | ORAL | 11 refills | Status: DC

## 2020-09-11 NOTE — Patient Instructions (Signed)
It was a pleasure seeing you today!  MEDICATIONS: -We are changing your medications today -Stop Losartan and start Entresto 49/51 mg (1 tablet) twice daily.  -Call if you have questions about your medications.  LABS: -We will call you if your labs need attention.  NEXT APPOINTMENT: Return to clinic in 1 week with Dr. Aundra Dubin.  In general, to take care of your heart failure: -Limit your fluid intake to 2 Liters (half-gallon) per day.   -Limit your salt intake to ideally 2-3 grams (2000-3000 mg) per day. -Weigh yourself daily and record, and bring that "weight diary" to your next appointment.  (Weight gain of 2-3 pounds in 1 day typically means fluid weight.) -The medications for your heart are to help your heart and help you live longer.   -Please contact us before stopping any of your heart medications.  Call the clinic at [phone] with questions or to reschedule future appointments.

## 2020-09-11 NOTE — Telephone Encounter (Signed)
Informed patient of sleep study results and patient understanding was verbalized. Patient understands her sleep study showed they have sleep apnea and recommend CPAP titration. Please set up titration in the sleep lab ASAP. Pt is aware of her results  Titration to be ordered

## 2020-09-11 NOTE — Telephone Encounter (Signed)
-----   Message from Sueanne Margarita, MD sent at 08/29/2020  3:55 PM EDT ----- Please let patient know that they have sleep apnea and recommend CPAP titration. Please set up titration in the sleep lab ASAP

## 2020-09-17 ENCOUNTER — Encounter (HOSPITAL_COMMUNITY): Payer: Medicare Other | Admitting: Cardiology

## 2020-09-17 ENCOUNTER — Encounter (HOSPITAL_BASED_OUTPATIENT_CLINIC_OR_DEPARTMENT_OTHER): Payer: Self-pay | Admitting: *Deleted

## 2020-09-17 ENCOUNTER — Other Ambulatory Visit: Payer: Self-pay

## 2020-09-17 ENCOUNTER — Emergency Department (HOSPITAL_BASED_OUTPATIENT_CLINIC_OR_DEPARTMENT_OTHER)
Admission: EM | Admit: 2020-09-17 | Discharge: 2020-09-17 | Disposition: A | Discharge status: Home / Self Care | Payer: Medicare Other | Attending: Emergency Medicine | Admitting: Emergency Medicine

## 2020-09-17 ENCOUNTER — Emergency Department (HOSPITAL_BASED_OUTPATIENT_CLINIC_OR_DEPARTMENT_OTHER): Payer: Medicare Other

## 2020-09-17 DIAGNOSIS — R197 Diarrhea, unspecified: Secondary | ICD-10-CM | POA: Insufficient documentation

## 2020-09-17 DIAGNOSIS — Z7982 Long term (current) use of aspirin: Secondary | ICD-10-CM | POA: Insufficient documentation

## 2020-09-17 DIAGNOSIS — Z7902 Long term (current) use of antithrombotics/antiplatelets: Secondary | ICD-10-CM | POA: Diagnosis not present

## 2020-09-17 DIAGNOSIS — I509 Heart failure, unspecified: Secondary | ICD-10-CM | POA: Diagnosis not present

## 2020-09-17 DIAGNOSIS — R1084 Generalized abdominal pain: Secondary | ICD-10-CM | POA: Diagnosis not present

## 2020-09-17 DIAGNOSIS — Z79899 Other long term (current) drug therapy: Secondary | ICD-10-CM | POA: Diagnosis not present

## 2020-09-17 DIAGNOSIS — I110 Hypertensive heart disease with heart failure: Secondary | ICD-10-CM | POA: Insufficient documentation

## 2020-09-17 DIAGNOSIS — I2510 Atherosclerotic heart disease of native coronary artery without angina pectoris: Secondary | ICD-10-CM | POA: Insufficient documentation

## 2020-09-17 DIAGNOSIS — R1110 Vomiting, unspecified: Secondary | ICD-10-CM | POA: Diagnosis not present

## 2020-09-17 DIAGNOSIS — R109 Unspecified abdominal pain: Secondary | ICD-10-CM

## 2020-09-17 LAB — CBC WITH DIFFERENTIAL/PLATELET
Abs Immature Granulocytes: 0.01 10*3/uL (ref 0.00–0.07)
Basophils Absolute: 0 10*3/uL (ref 0.0–0.1)
Basophils Relative: 0 %
Eosinophils Absolute: 0 10*3/uL (ref 0.0–0.5)
Eosinophils Relative: 1 %
HCT: 39 % (ref 36.0–46.0)
Hemoglobin: 13.2 g/dL (ref 12.0–15.0)
Immature Granulocytes: 0 %
Lymphocytes Relative: 36 %
Lymphs Abs: 1.8 10*3/uL (ref 0.7–4.0)
MCH: 30.3 pg (ref 26.0–34.0)
MCHC: 33.8 g/dL (ref 30.0–36.0)
MCV: 89.7 fL (ref 80.0–100.0)
Monocytes Absolute: 0.6 10*3/uL (ref 0.1–1.0)
Monocytes Relative: 12 %
Neutro Abs: 2.6 10*3/uL (ref 1.7–7.7)
Neutrophils Relative %: 51 %
Platelets: 201 10*3/uL (ref 150–400)
RBC: 4.35 MIL/uL (ref 3.87–5.11)
RDW: 13.5 % (ref 11.5–15.5)
WBC: 5.1 10*3/uL (ref 4.0–10.5)
nRBC: 0 % (ref 0.0–0.2)

## 2020-09-17 LAB — COMPREHENSIVE METABOLIC PANEL
ALT: 14 U/L (ref 0–44)
AST: 17 U/L (ref 15–41)
Albumin: 4 g/dL (ref 3.5–5.0)
Alkaline Phosphatase: 80 U/L (ref 38–126)
Anion gap: 10 (ref 5–15)
BUN: 20 mg/dL (ref 8–23)
CO2: 24 mmol/L (ref 22–32)
Calcium: 8.6 mg/dL — ABNORMAL LOW (ref 8.9–10.3)
Chloride: 104 mmol/L (ref 98–111)
Creatinine, Ser: 1.74 mg/dL — ABNORMAL HIGH (ref 0.44–1.00)
GFR, Estimated: 30 mL/min — ABNORMAL LOW (ref >60)
Glucose, Bld: 112 mg/dL — ABNORMAL HIGH (ref 70–99)
Potassium: 3.9 mmol/L (ref 3.5–5.1)
Sodium: 138 mmol/L (ref 135–145)
Total Bilirubin: 0.3 mg/dL (ref 0.3–1.2)
Total Protein: 7 g/dL (ref 6.5–8.1)

## 2020-09-17 LAB — LIPASE, BLOOD: Lipase: 48 U/L (ref 11–51)

## 2020-09-17 MED ORDER — ONDANSETRON HCL 4 MG/2ML IJ SOLN
4.0000 mg | Freq: Once | INTRAMUSCULAR | Status: AC
  Administered 2020-09-17: 4 mg via INTRAVENOUS
  Filled 2020-09-17: qty 2

## 2020-09-17 MED ORDER — SODIUM CHLORIDE 0.9 % IV BOLUS
1000.0000 mL | Freq: Once | INTRAVENOUS | Status: AC
  Administered 2020-09-17: 1000 mL via INTRAVENOUS

## 2020-09-17 MED ORDER — IOHEXOL 300 MG/ML  SOLN
100.0000 mL | Freq: Once | INTRAMUSCULAR | Status: AC | PRN
  Administered 2020-09-17: 100 mL via INTRAVENOUS

## 2020-09-17 MED ORDER — FENTANYL CITRATE (PF) 100 MCG/2ML IJ SOLN
50.0000 ug | Freq: Once | INTRAMUSCULAR | Status: AC
  Administered 2020-09-17: 50 ug via INTRAVENOUS
  Filled 2020-09-17: qty 2

## 2020-09-17 NOTE — ED Provider Notes (Signed)
Patient has finished her second liter of fluid.  She is well-appearing and feeling okay.  Feel that she is comfortable for discharge home.  She was not able to produce a large amount of stool but will be sent home with a collection kit and will take it to her PCP.   Blanchie Dessert, MD 09/17/20 1759

## 2020-09-17 NOTE — ED Notes (Signed)
Patient transported to X-ray 

## 2020-09-17 NOTE — ED Provider Notes (Signed)
[facility] EMERGENCY DEPARTMENT Provider Note   CSN: [id number] Arrival date & time: 09/17/20  1347     History Chief Complaint  Patient presents with  . Diarrhea  . Emesis    Angela [name] is a 75 y.o. female.  She states that she has a history of gastroparesis and has about 1 or 2 episodes per year that require hospitalization.  These usually start with vomiting, and today is the first time she has had vomiting.  She has only had 1 episode.  The history is provided by the patient.  Diarrhea Quality:  Explosive Severity:  Severe Onset quality:  Sudden Number of episodes:  8 Duration:  3 days Timing:  Intermittent Progression:  Worsening Relieved by:  Nothing Worsened by:  Nothing Ineffective treatments:  None tried Associated symptoms: abdominal pain (diffuse) and vomiting   Associated symptoms: no arthralgias, no chills and no fever   Risk factors: no recent antibiotic use, no sick contacts, no suspicious food intake and no travel to endemic areas   Emesis Associated symptoms: abdominal pain (diffuse) and diarrhea   Associated symptoms: no arthralgias, no chills, no cough, no fever and no sore throat        Past Medical History:  Diagnosis Date  . Anxiety   . Cancer of sigmoid ([place])   . CHF (congestive heart failure) ([place])   . Depression (emotion)   . DJD (degenerative joint disease)   . Fibromyalgia   . GERD (gastroesophageal reflux disease)   . Hemorrhoids   . Hyperlipidemia   . Hypertension   . IBS (irritable bowel syndrome)   . MRSA (methicillin resistant Staphylococcus aureus) septicemia ([place])    history of  . Over weight   . Pancreatitis   . RLS (restless legs syndrome)   . Tachycardia     Patient Active Problem List   Diagnosis Date Noted  . Unstable angina ([place])   . Intractable vomiting with nausea 11/30/2018  . AKI (acute kidney injury) ([place]) 11/30/2018  . Exertional dyspnea 08/19/2017  . Accelerated hypertension  05/31/2017  . Elevated lactic acid level 05/31/2017  . Headache 05/31/2017  . Constipation 05/31/2017  . Hypokalemia 05/31/2017  . Fibromyalgia 05/29/2017  . Dehydration 05/16/2016  . Muscle ache of extremity 02/20/2016  . CAD (coronary artery disease) 01/08/2016  . Abnormal stress test 12/12/2015  . Chest pain 11/27/2015  . Family history of early CAD 11/27/2015  . Hyperlipidemia 11/27/2015    Past Surgical History:  Procedure Laterality Date  . CARDIAC CATHETERIZATION [name] 12/26/2015   Procedure: Left Heart Cath and Coronary Angiography;  Surgeon: Troy Sine, MD;  Location: [facility];  Service: Cardiovascular;  Laterality: [name];  . child birth    . CORONARY STENT INTERVENTION [name] 10/28/2019   Procedure: CORONARY STENT INTERVENTION;  Surgeon: Burnell Blanks, MD;  Location: [facility];  Service: Cardiovascular;  Laterality: [name];  . LEFT HEART CATH AND CORONARY ANGIOGRAPHY [name] 10/28/2019   Procedure: LEFT HEART CATH AND CORONARY ANGIOGRAPHY;  Surgeon: Burnell Blanks, MD;  Location: [facility];  Service: Cardiovascular;  Laterality: [name];     OB History   No obstetric history on file.     Family History  Problem Relation Age of Onset  . Hypertension Mother   . Heart disease Mother   . Heart attack Father   . Stroke Father   . Diabetes Sister   . Hypertension Sister   . Stroke Other   .  Diabetes Other   . Heart disease Other     Social History   Tobacco Use  . Smoking status: Never Smoker  . Smokeless tobacco: Never Used  Substance Use Topics  . Alcohol use: No    Alcohol/week: 0.0 standard drinks  . Drug use: No    Home Medications Prior to Admission medications   Medication Sig Start Date End Date Taking? Authorizing Provider  aspirin EC 81 MG tablet Take 81 mg by mouth daily.   Yes [provider]  clopidogrel (PLAVIX) 75 MG tablet Take 1 tablet (75 mg total) by mouth daily with breakfast. 05/21/20  Yes  Larey Dresser, MD  Cyanocobalamin (B-12) 2000 MCG TABS Take 2,000 mcg by mouth daily.    Yes [provider]  DULoxetine (CYMBALTA) 20 MG capsule Take 20 mg by mouth 2 (two) times daily.  03/26/17  Yes [provider]  escitalopram (LEXAPRO) 10 MG tablet Take 10 mg by mouth daily. 11/18/18  Yes [provider]  Evolocumab (REPATHA SURECLICK) 680 MG/ML SOAJ Inject 1 pen into the skin every 14 (fourteen) days. 02/21/20  Yes Dorothy Spark, MD  fluticasone (FLONASE) 50 MCG/ACT nasal spray Place 2 sprays into both nostrils daily as needed for allergies (seasonal allergies).    Yes [provider]  furosemide (LASIX) 20 MG tablet Take 20 mg by mouth 3 (three) times a week.   Yes [provider]  HYDROcodone-acetaminophen (NORCO) 10-325 MG tablet Take 1 tablet by mouth every 4 (four) hours as needed.   Yes [provider]  isosorbide mononitrate (IMDUR) 60 MG 24 hr tablet Take 60 mg by mouth daily.   Yes [provider]  LORazepam (ATIVAN) 2 MG tablet Take 2 mg by mouth at bedtime.   Yes [provider]  metoprolol succinate (TOPROL-XL) 100 MG 24 hr tablet Take 1 tablet (100 mg total) by mouth 2 (two) times daily. Take with or immediately following a meal. 12/20/19  Yes Larey Dresser, MD  ondansetron (ZOFRAN) 4 MG tablet Take 4 mg by mouth every 8 (eight) hours as needed for nausea or vomiting.   Yes [provider]  pantoprazole (PROTONIX) 40 MG tablet Take 1 tablet (40 mg total) by mouth 2 (two) times daily. 10/28/19 10/27/20 Yes Burnell Blanks, MD  rOPINIRole (REQUIP) 1 MG tablet Take 1-2 mg by mouth See admin instructions. Take 2 tablets (2 mg) by mouth daily at bedtime, may also take 1 tablet (1 mg) in the afternoon 15:00 for restless legs   Yes [provider]  rosuvastatin (CRESTOR) 10 MG tablet Take 1 tablet (10 mg total) by mouth at bedtime. 07/09/20  Yes Dorothy Spark, MD   sacubitril-valsartan (ENTRESTO) 49-51 MG Take 1 tablet by mouth 2 (two) times daily. 09/11/20  Yes Larey Dresser, MD  tiZANidine (ZANAFLEX) 4 MG tablet Take 4 mg by mouth every 8 (eight) hours as needed for muscle spasms.  04/21/17  Yes [provider]  levorphanol (LEVODROMORAN) 2 MG tablet Take 2 mg by mouth 3 (three) times daily.  Patient not taking: No sig reported 09/22/19   [provider]  lubiprostone (AMITIZA) 24 MCG capsule Take 24 mcg by mouth 2 (two) times daily with a meal.     [provider]  nitroGLYCERIN (NITROSTAT) 0.4 MG SL tablet Place 0.4 mg under the tongue every 5 (five) minutes as needed for chest pain.    [provider]    Allergies  Phenergan [promethazine], Timentin [ticarcillin-pot clavulanate], Pregabalin, and Reglan [metoclopramide]  Review of Systems   Review of Systems  Constitutional: Negative for chills and fever.  HENT: Negative for ear pain and sore throat.   Eyes: Negative for pain and visual disturbance.  Respiratory: Negative for cough and shortness of breath.   Cardiovascular: Negative for chest pain and palpitations.  Gastrointestinal: Positive for abdominal pain (diffuse), diarrhea, nausea and vomiting.  Genitourinary: Negative for dysuria and hematuria.  Musculoskeletal: Negative for arthralgias and back pain.  Skin: Negative for color change and rash.  Neurological: Negative for seizures and syncope.  All other systems reviewed and are negative.   Physical Exam Updated Vital Signs BP 135/68 (BP Location: Right Arm)   Pulse 74   Temp 98.4 F (36.9 C) (Oral)   Resp 16   Ht 5\' 6"  (1.676 m)   Wt 79.9 kg   SpO2 99%   BMI 28.44 kg/m   Physical Exam Vitals and nursing note reviewed.  HENT:     Head: Normocephalic and atraumatic.     Mouth/Throat:     Mouth: Mucous membranes are dry.  Eyes:     General: No scleral icterus. Pulmonary:     Effort: Pulmonary effort is normal. No respiratory  distress.  Abdominal:     General: Abdomen is flat. There is no distension.     Tenderness: There is abdominal tenderness. There is no guarding or rebound.     Comments: Diffuse tenderness  Musculoskeletal:     Cervical back: Normal range of motion.  Skin:    General: Skin is warm and dry.     Capillary Refill: Capillary refill takes less than 2 seconds.  Neurological:     General: No focal deficit present.     Mental Status: She is alert.  Psychiatric:        Mood and Affect: Mood normal.     ED Results / Procedures / Treatments   Labs (all labs ordered are listed, but only abnormal results are displayed) Labs Reviewed  COMPREHENSIVE METABOLIC PANEL - Abnormal; Notable for the following components:      Result Value   Glucose, Bld 112 (*)    Creatinine, Ser 1.74 (*)    Calcium 8.6 (*)    GFR, Estimated 30 (*)    All other components within normal limits  GASTROINTESTINAL PANEL BY PCR, STOOL (REPLACES STOOL CULTURE)  CBC WITH DIFFERENTIAL/PLATELET  LIPASE, BLOOD    EKG None  Radiology CT Abdomen Pelvis W Contrast  Result Date: 09/17/2020 CLINICAL DATA:  Diarrhea, abdominal pain EXAM: CT ABDOMEN AND PELVIS WITH CONTRAST TECHNIQUE: Multidetector CT imaging of the abdomen and pelvis was performed using the standard protocol following bolus administration of intravenous contrast. CONTRAST:  136mL OMNIPAQUE IOHEXOL 300 MG/ML  SOLN COMPARISON:  11/30/2018 FINDINGS: Lower chest: No acute abnormality. Hepatobiliary: Too small to characterize low-attenuation lesion of the posterior right hepatic lobe. Post cholecystectomy. No unexpected biliary dilatation. Pancreas: Unremarkable. No pancreatic ductal dilatation or surrounding inflammatory changes. Spleen: Two too small to characterize hypoattenuating lesions along the hilum. Otherwise unremarkable. Adrenals/Urinary Tract: Adrenals are unremarkable. Few too small to characterize low-attenuation lesions. No hydronephrosis. No calculi.  Ureters are unremarkable. Bladder is poorly distended. Stomach/Bowel: Stomach is within normal limits apart from small hiatal hernia. Suture material reflecting anastomosis at the level of the descending-sigmoid junction. Bowel is normal in caliber. Fluid-filled colon. Vascular/Lymphatic: Aortic atherosclerosis. No enlarged lymph nodes. Reproductive: Status post hysterectomy. No adnexal masses. Other: No free fluid. No  acute abnormality of the abdominal wall. Small fat containing periumbilical hernia. Musculoskeletal: No acute abnormality. Degenerative changes of the lumbar spine. IMPRESSION: No acute abnormality. Fluid-filled colon consistent with history of diarrhea. Electronically Signed   By: Macy Mis M.D.   On: 09/17/2020 15:42    Procedures Procedures   Medications Ordered in ED Medications  sodium chloride 0.9 % bolus 1,000 mL (0 mLs Intravenous Stopped 09/17/20 1714)  fentaNYL (SUBLIMAZE) injection 50 mcg (50 mcg Intravenous Given 09/17/20 1523)  ondansetron (ZOFRAN) injection 4 mg (4 mg Intravenous Given 09/17/20 1522)  iohexol (OMNIPAQUE) 300 MG/ML solution 100 mL (100 mLs Intravenous Contrast Given 09/17/20 1509)  sodium chloride 0.9 % bolus 1,000 mL (0 mLs Intravenous Stopped 09/17/20 1714)    ED Course  I have reviewed the triage vital signs and the nursing notes.  Pertinent labs & imaging results that were available during my care of the patient were reviewed by me and considered in my medical decision making (see chart for details).    MDM Rules/Calculators/A&P                          Ted Mcalpine presents with diarrhea, abdominal pain, and one episode of vomiting.  She has a history of gastroparesis, but I am more suspicious for an infectious or inflammatory etiology of her diarrhea.  She is getting IV hydration, and her GFR has decreased relative to her last check.  She will be getting a CT of the abdomen and stool studies that she is able to produce a sample.  CT  normal. She will be hydrated and discharged as symptoms dictate. Final Clinical Impression(s) / ED Diagnoses Final diagnoses:  Diarrhea, unspecified type  Abdominal pain, unspecified abdominal location    Rx / DC Orders ED Discharge Orders    None       Arnaldo Natal, MD 09/18/20 0710

## 2020-09-17 NOTE — ED Triage Notes (Signed)
Abdominal pain. Diarrhea x 3 days. Vomiting this am. Hx of gastroparesis.

## 2020-09-24 ENCOUNTER — Encounter (HOSPITAL_COMMUNITY): Payer: Self-pay

## 2020-10-02 ENCOUNTER — Telehealth (HOSPITAL_COMMUNITY): Payer: Self-pay | Admitting: *Deleted

## 2020-10-02 NOTE — Telephone Encounter (Signed)
Patient left VM on triage line stating she was told by Dr.Turners office she would need a new sleep study and cpap but has not heard from them.  Message routed to [facility]

## 2020-10-02 NOTE — Telephone Encounter (Signed)
Called patient and informed her we are waiting on the prior auth from her insurance. Pt is agreeable to treatment.

## 2020-10-04 DIAGNOSIS — M961 Postlaminectomy syndrome, not elsewhere classified: Secondary | ICD-10-CM | POA: Diagnosis not present

## 2020-10-04 DIAGNOSIS — G894 Chronic pain syndrome: Secondary | ICD-10-CM | POA: Diagnosis not present

## 2020-10-04 DIAGNOSIS — M5030 Other cervical disc degeneration, unspecified cervical region: Secondary | ICD-10-CM | POA: Diagnosis not present

## 2020-10-04 DIAGNOSIS — M5412 Radiculopathy, cervical region: Secondary | ICD-10-CM | POA: Diagnosis not present

## 2020-10-26 ENCOUNTER — Other Ambulatory Visit (HOSPITAL_COMMUNITY): Payer: Self-pay

## 2020-10-26 ENCOUNTER — Encounter (HOSPITAL_COMMUNITY): Payer: Self-pay | Admitting: Cardiology

## 2020-10-26 ENCOUNTER — Other Ambulatory Visit: Payer: Self-pay

## 2020-10-26 ENCOUNTER — Ambulatory Visit (HOSPITAL_COMMUNITY)
Admission: RE | Admit: 2020-10-26 | Discharge: 2020-10-26 | Disposition: A | Discharge status: Home / Self Care | Payer: Medicare Other | Source: Ambulatory Visit | Attending: Cardiology | Admitting: Cardiology

## 2020-10-26 VITALS — BP 140/80 | HR 61 | Wt 179.8 lb

## 2020-10-26 DIAGNOSIS — I200 Unstable angina: Secondary | ICD-10-CM | POA: Insufficient documentation

## 2020-10-26 DIAGNOSIS — R0602 Shortness of breath: Secondary | ICD-10-CM | POA: Insufficient documentation

## 2020-10-26 DIAGNOSIS — I5032 Chronic diastolic (congestive) heart failure: Secondary | ICD-10-CM | POA: Insufficient documentation

## 2020-10-26 DIAGNOSIS — R0789 Other chest pain: Secondary | ICD-10-CM | POA: Insufficient documentation

## 2020-10-26 DIAGNOSIS — I2510 Atherosclerotic heart disease of native coronary artery without angina pectoris: Secondary | ICD-10-CM

## 2020-10-26 DIAGNOSIS — Z7982 Long term (current) use of aspirin: Secondary | ICD-10-CM | POA: Diagnosis not present

## 2020-10-26 DIAGNOSIS — Z7902 Long term (current) use of antithrombotics/antiplatelets: Secondary | ICD-10-CM | POA: Diagnosis not present

## 2020-10-26 DIAGNOSIS — N183 Chronic kidney disease, stage 3 unspecified: Secondary | ICD-10-CM | POA: Insufficient documentation

## 2020-10-26 DIAGNOSIS — Z8249 Family history of ischemic heart disease and other diseases of the circulatory system: Secondary | ICD-10-CM | POA: Diagnosis not present

## 2020-10-26 DIAGNOSIS — Z7901 Long term (current) use of anticoagulants: Secondary | ICD-10-CM | POA: Insufficient documentation

## 2020-10-26 DIAGNOSIS — Z955 Presence of coronary angioplasty implant and graft: Secondary | ICD-10-CM | POA: Insufficient documentation

## 2020-10-26 DIAGNOSIS — Z79899 Other long term (current) drug therapy: Secondary | ICD-10-CM | POA: Diagnosis not present

## 2020-10-26 DIAGNOSIS — Z7984 Long term (current) use of oral hypoglycemic drugs: Secondary | ICD-10-CM | POA: Insufficient documentation

## 2020-10-26 DIAGNOSIS — G4733 Obstructive sleep apnea (adult) (pediatric): Secondary | ICD-10-CM | POA: Insufficient documentation

## 2020-10-26 DIAGNOSIS — I130 Hypertensive heart and chronic kidney disease with heart failure and stage 1 through stage 4 chronic kidney disease, or unspecified chronic kidney disease: Secondary | ICD-10-CM | POA: Diagnosis not present

## 2020-10-26 LAB — BASIC METABOLIC PANEL
Anion gap: 7 (ref 5–15)
BUN: 10 mg/dL (ref 8–23)
CO2: 26 mmol/L (ref 22–32)
Calcium: 9 mg/dL (ref 8.9–10.3)
Chloride: 109 mmol/L (ref 98–111)
Creatinine, Ser: 1.2 mg/dL — ABNORMAL HIGH (ref 0.44–1.00)
GFR, Estimated: 48 mL/min — ABNORMAL LOW (ref >60)
Glucose, Bld: 116 mg/dL — ABNORMAL HIGH (ref 70–99)
Potassium: 3.9 mmol/L (ref 3.5–5.1)
Sodium: 142 mmol/L (ref 135–145)

## 2020-10-26 LAB — BRAIN NATRIURETIC PEPTIDE: B Natriuretic Peptide: 200.7 pg/mL — ABNORMAL HIGH (ref 0.0–100.0)

## 2020-10-26 NOTE — Progress Notes (Signed)
Medication Samples have been provided to the patient.  Drug name: Delene Loll       Strength: 49/51 mg        Qty: 4  LOT: [id number]  Exp.Date: 04/24  Dosing instructions: take 2 tablets Twice daily   The patient has been instructed regarding the correct time, dose, and frequency of taking this medication, including desired effects and most common side effects.   Juanita Laster Nyeema Want 12:26 PM 10/26/2020

## 2020-10-26 NOTE — Progress Notes (Signed)
ReDS Vest / Clip - 10/26/20 1200      ReDS Vest / Clip   Station Marker C    Ruler Value 25    ReDS Value Range Low volume    ReDS Actual Value 34

## 2020-10-26 NOTE — Patient Instructions (Addendum)
Labs done today. We will contact you only if your labs are abnormal.  INCREASE Entresto to 97-103mg  (1 tablet) by mouth 2 times daily. Samples were provided to you today during your office visit.   No other medication changes were made. Please continue all current medications as prescribed.  Your physician recommends that you schedule a follow-up appointment in: 10 days for a lab only appointment and 3 months    If you have any questions or concerns before your next appointment please send [place] a message through [place] or call our office at [phone].    TO LEAVE A MESSAGE FOR THE NURSE SELECT OPTION 2, PLEASE LEAVE A MESSAGE INCLUDING: . YOUR NAME . DATE OF BIRTH . CALL BACK NUMBER . REASON FOR CALL**this is important as we prioritize the call backs  YOU WILL RECEIVE A CALL BACK THE SAME DAY AS LONG AS YOU CALL BEFORE 4:00 PM   Do the following things EVERYDAY: 1) Weigh yourself in the morning before breakfast. Write it down and keep it in a log. 2) Take your medicines as prescribed 3) Eat low salt foods--Limit salt (sodium) to 2000 mg per day.  4) Stay as active as you can everyday 5) Limit all fluids for the day to less than 2 liters   At the [facility], you and your health needs are our priority. As part of our continuing mission to provide you with exceptional heart care, we have created designated Provider Care Teams. These Care Teams include your primary Cardiologist (physician) and Advanced Practice Providers (APPs- Physician Assistants and Nurse Practitioners) who all work together to provide you with the care you need, when you need it.   You may see any of the following providers on your designated Care Team at your next follow up: Marland Kitchen Dr Glori Bickers . Dr Loralie Champagne . Darrick Grinder, NP . [name], PA . Audry Riles, PharmD   Please be sure to bring in all your medications bottles to every appointment.

## 2020-10-28 NOTE — Progress Notes (Signed)
PCP: [name], MD Cardiology: Dr. [name] HF Cardiology: Dr. Aundra Dubin  75 y.o. with history of CAD, diastolic CHF, and exertional dyspnea was self-referred to CHF clinic.  I take care of her husband, Deja Pisarski.  She has struggled with exertional dyspnea for the last several months.  Last echo in 1/19 showed vigorous LV systolic function with a mild intracavitary gradient.  Given exertional dyspnea and chest pressure, she had LHC in 5/21, showing 95% pRCA stenosis that was treated with DES.   Despite PCI, she did not feel much better.  She continued to have exertional dyspnea and chest pressure.  Her chest pressure was nonexertional and happened at random.  Echo was done in 6/21, showing hyperdynamic LV with EF 65-70%, moderate LVH, normal RV.  I increased her Toprol XL to see if this would help her diastolic function and also started her on low dose Lasix 20 mg daily.  Creatinine rose to 2.33 and Lasix was later stopped, creatinine dropped back to 1.08.  She now takes Lasix 20 mg three times a week.   Cardiolite in 3/22 was a low risk study.   She returns today for followup of CAD, diastolic CHF.  She has generalized fatigue, found to have severe OSA.  She still gets short of breath walking longer distances.  BP is better but SBP still in 140s generally.  She gets brief episodes of atypical chest pain.    REDS clip 34%  Labs (5/21): K 4.1, creatinine 1.07. NT-proBNP 721 Labs (6/21): creatinine 2.33 Labs (7/21): LDL 72, K 3.7, creatinine 1.08 Labs (9/21): LDL 93, HDL 40 Labs (2/22): LDL 10, HDL 52 Labs (4/22): K 3.9, creatinine 1.74  PMH:  1. Diastolic CHF: Echo in 6/16 with EF 65-70%, mild LV intracavitary gradient, mild LVH, very mild AS, RV size and systolic function normal.   - Echo (6/21): EF 65-70%, moderate LVH, normal RV, mild MR.  2. Hyperlipidemia; Myalgias with atorvastatin, tolerates low dose Crestor.  3. CAD: LHC in 2017 with moderate nonobstructive disease.  - LHC (5/21):  70% D1, 95% pRCA, 60% mLAD, 60% dLAD. DES to RCA.  - Cardiolite (3/22): small partially reversible mid anterior defect, low risk and possibly shifting breast artifact.  4. HTN: Edema with amlodipine.  - Renal artery dopplers in 3/22: No evidence for renal artery stenosis.  5. Gastroparesis 6. OSA 7. CKD stage 3  ROS: All systems reviewed and negative except as per HPI.   Social History   Socioeconomic History  . Marital status: Married    Spouse name: Not on file  . Number of children: Not on file  . Years of education: Not on file  . Highest education level: Not on file  Occupational History  . Not on file  Tobacco Use  . Smoking status: Never Smoker  . Smokeless tobacco: Never Used  Substance and Sexual Activity  . Alcohol use: No    Alcohol/week: 0.0 standard drinks  . Drug use: No  . Sexual activity: Not on file  Other Topics Concern  . Not on file  Social History Narrative  . Not on file   Social Determinants of Health   Financial Resource Strain: Not on file  Food Insecurity: Not on file  Transportation Needs: Not on file  Physical Activity: Not on file  Stress: Not on file  Social Connections: Not on file  Intimate Partner Violence: Not on file   Family History  Problem Relation Age of Onset  . Hypertension Mother   .  Heart disease Mother   . Heart attack Father   . Stroke Father   . Diabetes Sister   . Hypertension Sister   . Stroke Other   . Diabetes Other   . Heart disease Other    Current Outpatient Medications  Medication Sig Dispense Refill  . aspirin EC 81 MG tablet Take 81 mg by mouth daily.    . clopidogrel (PLAVIX) 75 MG tablet Take 1 tablet (75 mg total) by mouth daily with breakfast. 90 tablet 3  . Cyanocobalamin (B-12) 2000 MCG TABS Take 2,000 mcg by mouth daily.     . DULoxetine (CYMBALTA) 20 MG capsule Take 20 mg by mouth 2 (two) times daily.     Marland Kitchen escitalopram (LEXAPRO) 10 MG tablet Take 10 mg by mouth daily.    . Evolocumab  (REPATHA SURECLICK) 606 MG/ML SOAJ Inject 1 pen into the skin every 14 (fourteen) days. 6 mL 3  . ezetimibe (ZETIA) 10 MG tablet Take 10 mg by mouth daily.    . fluticasone (FLONASE) 50 MCG/ACT nasal spray Place 2 sprays into both nostrils daily as needed for allergies (seasonal allergies).     . furosemide (LASIX) 20 MG tablet Take 20 mg by mouth 3 (three) times a week.    Marland Kitchen HYDROcodone-acetaminophen (NORCO) 10-325 MG tablet Take 1 tablet by mouth every 4 (four) hours as needed.    . isosorbide mononitrate (IMDUR) 60 MG 24 hr tablet Take 60 mg by mouth daily.    Marland Kitchen LORazepam (ATIVAN) 2 MG tablet Take 2 mg by mouth at bedtime.    Marland Kitchen losartan (COZAAR) 50 MG tablet Take 50 mg by mouth daily.    . metoprolol succinate (TOPROL-XL) 100 MG 24 hr tablet Take 1 tablet (100 mg total) by mouth 2 (two) times daily. Take with or immediately following a meal. 180 tablet 6  . nitroGLYCERIN (NITROSTAT) 0.4 MG SL tablet Place 0.4 mg under the tongue every 5 (five) minutes as needed for chest pain.    Marland Kitchen ondansetron (ZOFRAN) 4 MG tablet Take 4 mg by mouth every 8 (eight) hours as needed for nausea or vomiting.    . pantoprazole (PROTONIX) 40 MG tablet Take 1 tablet (40 mg total) by mouth 2 (two) times daily. 30 tablet 11  . rOPINIRole (REQUIP) 1 MG tablet Take 1-2 mg by mouth See admin instructions. Take 2 tablets (2 mg) by mouth daily at bedtime, may also take 1 tablet (1 mg) in the afternoon 15:00 for restless legs    . rosuvastatin (CRESTOR) 10 MG tablet Take 1 tablet (10 mg total) by mouth at bedtime. 90 tablet 3  . tiZANidine (ZANAFLEX) 4 MG tablet Take 4 mg by mouth every 8 (eight) hours as needed for muscle spasms.      No current facility-administered medications for this encounter.   BP 140/80   Pulse 61   Wt 81.6 kg (179 lb 12.8 oz)   SpO2 97%   BMI 29.02 kg/m  General: NAD Neck: No JVD, no thyromegaly or thyroid nodule.  Lungs: Clear to auscultation bilaterally with normal respiratory effort. CV:  Nondisplaced PMI.  Heart regular S1/S2, no S3/S4, no murmur.  No peripheral edema.  No carotid bruit.  Normal pedal pulses.  Abdomen: Soft, nontender, no hepatosplenomegaly, no distention.  Skin: Intact without lesions or rashes.  Neurologic: Alert and oriented x 3.  Psych: Normal affect. Extremities: No clubbing or cyanosis.  HEENT: Normal.   Assessment/Plan:  1. Chronic diastolic CHF: Echo in 3/01 showed  vigorous LV systolic function EF 17-00% with intracavity gradient.  Echo in 6/21 showed EF 65-70% with moderate LVH, normal RV.  Symptoms are NYHA class II. She does not appear volume overloaded on exam or by REDS clip.   - Continue Toprol XL 100 mg bid.   - Can continue Imdur 60 mg daily.  - Continue Lasix 20 mg three times/week, check BMET/BNP today.  - Once BP is controlled, will start Jardiance 10 mg daily given diastolic CHF (avoid too many changes at once).  2. Coronary artery disease: DES to RCA in 5/21. Cardiolite in 3/22 was low risk. Still with atypical chest pain.  - Continue ASA 81 and Plavix 75 daily.  - Continue Crestor 10 g daily, Repatha, and Zetia 10 mg daily.  Good lipids in 2/22.  3. CKD stage 3: Creatinine up to 2.33 with Lasix daily, last creatinine in 4/22 was 1.74.    - BMET today.  4. HTN: BP elevated today.  Renal artery dopplers in 3/22 without renal artery stenosis.  - Continue Toprol XL 100 mg bid  - Increase Entresto to 97/103 bid.  5. OSA: Waiting to get CPAP.  She has severe OSA, likely contributes to fatigue and HTN.   Followup in 3 months   Loralie Champagne 10/28/2020

## 2020-10-30 ENCOUNTER — Telehealth (HOSPITAL_COMMUNITY): Payer: Self-pay | Admitting: *Deleted

## 2020-10-30 NOTE — Telephone Encounter (Signed)
Pt returned my call and said she had been taking both losartan and entresto.  Pt said she increased entesto at her last office visit but continued to take losartan. Pt will stop losartan and continue entresto.

## 2020-10-30 NOTE — Telephone Encounter (Signed)
-----   Message from Larey Dresser, MD sent at 10/28/2020  8:27 PM EDT ----- Please call patient regarding her meds.  I cannot tell if she is on losartan or Entresto.  She should not be taking both.  We had wanted her to increase Entresto at office appt but looks like she may also have losartan.  Please let me know what she is actually taking so we can make sure her regimen is correct.

## 2020-10-30 NOTE — Telephone Encounter (Signed)
Left detailed message requesting a return call from pt.

## 2020-10-30 NOTE — Addendum Note (Signed)
Addended by: Harvie Junior on: 10/30/2020 09:53 AM   Modules accepted: Orders

## 2020-11-02 ENCOUNTER — Other Ambulatory Visit: Payer: Self-pay | Admitting: Physician Assistant

## 2020-11-05 ENCOUNTER — Other Ambulatory Visit (HOSPITAL_COMMUNITY): Payer: Medicare Other

## 2020-11-07 DIAGNOSIS — M25519 Pain in unspecified shoulder: Secondary | ICD-10-CM | POA: Diagnosis not present

## 2020-11-07 DIAGNOSIS — G894 Chronic pain syndrome: Secondary | ICD-10-CM | POA: Diagnosis not present

## 2020-11-07 DIAGNOSIS — M797 Fibromyalgia: Secondary | ICD-10-CM | POA: Diagnosis not present

## 2020-11-07 DIAGNOSIS — Z79899 Other long term (current) drug therapy: Secondary | ICD-10-CM | POA: Diagnosis not present

## 2020-11-07 DIAGNOSIS — M961 Postlaminectomy syndrome, not elsewhere classified: Secondary | ICD-10-CM | POA: Diagnosis not present

## 2020-11-07 DIAGNOSIS — Z79891 Long term (current) use of opiate analgesic: Secondary | ICD-10-CM | POA: Diagnosis not present

## 2020-11-12 ENCOUNTER — Other Ambulatory Visit: Payer: Self-pay

## 2020-11-12 ENCOUNTER — Ambulatory Visit (HOSPITAL_COMMUNITY)
Admission: RE | Admit: 2020-11-12 | Discharge: 2020-11-12 | Disposition: A | Discharge status: Home / Self Care | Payer: Medicare Other | Source: Ambulatory Visit | Attending: Internal Medicine | Admitting: Internal Medicine

## 2020-11-12 DIAGNOSIS — I200 Unstable angina: Secondary | ICD-10-CM

## 2020-11-12 DIAGNOSIS — I25119 Atherosclerotic heart disease of native coronary artery with unspecified angina pectoris: Secondary | ICD-10-CM

## 2020-11-12 LAB — LIPID PANEL
Cholesterol: 209 mg/dL — ABNORMAL HIGH (ref 0–200)
HDL: 42 mg/dL (ref >40)
LDL Cholesterol: 128 mg/dL — ABNORMAL HIGH (ref 0–99)
Total CHOL/HDL Ratio: 5 RATIO
Triglycerides: 194 mg/dL — ABNORMAL HIGH (ref <150)
VLDL: 39 mg/dL (ref 0–40)

## 2020-11-12 LAB — BASIC METABOLIC PANEL
Anion gap: 10 (ref 5–15)
BUN: 12 mg/dL (ref 8–23)
CO2: 28 mmol/L (ref 22–32)
Calcium: 9.4 mg/dL (ref 8.9–10.3)
Chloride: 105 mmol/L (ref 98–111)
Creatinine, Ser: 1.1 mg/dL — ABNORMAL HIGH (ref 0.44–1.00)
GFR, Estimated: 53 mL/min — ABNORMAL LOW (ref >60)
Glucose, Bld: 99 mg/dL (ref 70–99)
Potassium: 3.5 mmol/L (ref 3.5–5.1)
Sodium: 143 mmol/L (ref 135–145)

## 2020-11-14 ENCOUNTER — Other Ambulatory Visit (HOSPITAL_COMMUNITY): Payer: Self-pay | Admitting: Surgery

## 2020-11-14 MED ORDER — ENTRESTO 97-103 MG PO TABS: 1.0000 | ORAL_TABLET | Freq: Two times a day (BID) | ORAL | 6 refills | Status: DC

## 2020-11-14 NOTE — Progress Notes (Signed)
I called patient regarding labwork and given instructions per Dr. Aundra Dubin to clarify medications for her cholesterol and lipids.  She does say that she is currently taking all previously prescribed medications- Crestor 10 mg, Zetia 10 mg and Repatha.  She does admit to a short time (approx 1 week) without Zetia as she had run out of that medication.  She also inquired about her prescription for Entresto as it seems it has not been received by her pharmacy.  She says that she has not received her CPAP machine yet.  I refilled Entresto and will send a message to [facility] office to inquire about equipment.

## 2020-11-14 NOTE — Progress Notes (Signed)
I called patient regarding labwork and given instructions per Dr. Aundra Dubin to clarify medications for her cholesterol and lipids.  She does say that she is currently taking all previously prescribed medications- Crestor 10 mg, Zetia 10 mg and Repatha.  She does admit to a short time (approx 1 week) without Zetia as she had run out of that medication.  She also inquired about her prescription for Entresto as it seems it has not been received by her pharmacy.  She says that she has not received her CPAP machine yet.  I refilled Entresto and will send a message to [facility] office to inquire about equipment.

## 2020-11-15 DIAGNOSIS — E785 Hyperlipidemia, unspecified: Secondary | ICD-10-CM | POA: Diagnosis not present

## 2020-11-15 DIAGNOSIS — W19XXXA Unspecified fall, initial encounter: Secondary | ICD-10-CM | POA: Diagnosis not present

## 2020-11-15 DIAGNOSIS — F329 Major depressive disorder, single episode, unspecified: Secondary | ICD-10-CM | POA: Diagnosis not present

## 2020-11-15 DIAGNOSIS — K219 Gastro-esophageal reflux disease without esophagitis: Secondary | ICD-10-CM | POA: Diagnosis not present

## 2020-11-15 DIAGNOSIS — Z23 Encounter for immunization: Secondary | ICD-10-CM | POA: Diagnosis not present

## 2020-11-15 DIAGNOSIS — J309 Allergic rhinitis, unspecified: Secondary | ICD-10-CM | POA: Diagnosis not present

## 2020-11-15 DIAGNOSIS — I10 Essential (primary) hypertension: Secondary | ICD-10-CM | POA: Diagnosis not present

## 2020-11-15 DIAGNOSIS — R110 Nausea: Secondary | ICD-10-CM | POA: Diagnosis not present

## 2020-11-15 DIAGNOSIS — I2510 Atherosclerotic heart disease of native coronary artery without angina pectoris: Secondary | ICD-10-CM | POA: Diagnosis not present

## 2020-11-15 DIAGNOSIS — Z0000 Encounter for general adult medical examination without abnormal findings: Secondary | ICD-10-CM | POA: Diagnosis not present

## 2020-11-15 DIAGNOSIS — R7303 Prediabetes: Secondary | ICD-10-CM | POA: Diagnosis not present

## 2020-11-15 DIAGNOSIS — G4700 Insomnia, unspecified: Secondary | ICD-10-CM | POA: Diagnosis not present

## 2020-11-22 ENCOUNTER — Telehealth: Payer: Self-pay

## 2020-11-22 NOTE — Addendum Note (Signed)
Addended by: Freada Bergeron on: 11/22/2020 11:39 AM   Modules accepted: Orders

## 2020-11-22 NOTE — Telephone Encounter (Signed)
Called patient to inform her of her schedule sleep study apt at the [facility] location at 8 pm on 01/28/21, left message informing patient she has this apt and to call the office to confirm she is aware and if she has any questions to speak with myself or Gae Bon.

## 2020-12-05 DIAGNOSIS — Z20822 Contact with and (suspected) exposure to covid-19: Secondary | ICD-10-CM | POA: Diagnosis not present

## 2020-12-06 DIAGNOSIS — Z79891 Long term (current) use of opiate analgesic: Secondary | ICD-10-CM | POA: Diagnosis not present

## 2020-12-06 DIAGNOSIS — M5030 Other cervical disc degeneration, unspecified cervical region: Secondary | ICD-10-CM | POA: Diagnosis not present

## 2020-12-06 DIAGNOSIS — M5412 Radiculopathy, cervical region: Secondary | ICD-10-CM | POA: Diagnosis not present

## 2020-12-06 DIAGNOSIS — M961 Postlaminectomy syndrome, not elsewhere classified: Secondary | ICD-10-CM | POA: Diagnosis not present

## 2020-12-06 DIAGNOSIS — G894 Chronic pain syndrome: Secondary | ICD-10-CM | POA: Diagnosis not present

## 2020-12-06 DIAGNOSIS — Z79899 Other long term (current) drug therapy: Secondary | ICD-10-CM | POA: Diagnosis not present

## 2021-01-03 DIAGNOSIS — M961 Postlaminectomy syndrome, not elsewhere classified: Secondary | ICD-10-CM | POA: Diagnosis not present

## 2021-01-03 DIAGNOSIS — M25519 Pain in unspecified shoulder: Secondary | ICD-10-CM | POA: Diagnosis not present

## 2021-01-03 DIAGNOSIS — M797 Fibromyalgia: Secondary | ICD-10-CM | POA: Diagnosis not present

## 2021-01-03 DIAGNOSIS — G894 Chronic pain syndrome: Secondary | ICD-10-CM | POA: Diagnosis not present

## 2021-01-28 ENCOUNTER — Other Ambulatory Visit: Payer: Self-pay

## 2021-01-28 ENCOUNTER — Ambulatory Visit (HOSPITAL_BASED_OUTPATIENT_CLINIC_OR_DEPARTMENT_OTHER): Payer: Medicare Other | Attending: Cardiology | Admitting: Cardiology

## 2021-01-28 VITALS — Ht 66.0 in | Wt 180.0 lb

## 2021-01-28 DIAGNOSIS — G4733 Obstructive sleep apnea (adult) (pediatric): Secondary | ICD-10-CM | POA: Insufficient documentation

## 2021-01-29 ENCOUNTER — Other Ambulatory Visit (HOSPITAL_COMMUNITY): Payer: Self-pay

## 2021-01-29 ENCOUNTER — Ambulatory Visit (HOSPITAL_COMMUNITY)
Admission: RE | Admit: 2021-01-29 | Discharge: 2021-01-29 | Disposition: A | Discharge status: Home / Self Care | Payer: Medicare Other | Source: Ambulatory Visit | Attending: Cardiology | Admitting: Cardiology

## 2021-01-29 ENCOUNTER — Encounter (HOSPITAL_COMMUNITY): Payer: Self-pay | Admitting: Cardiology

## 2021-01-29 VITALS — BP 160/110 | HR 99 | Wt 180.4 lb

## 2021-01-29 DIAGNOSIS — I5032 Chronic diastolic (congestive) heart failure: Secondary | ICD-10-CM | POA: Diagnosis not present

## 2021-01-29 DIAGNOSIS — E782 Mixed hyperlipidemia: Secondary | ICD-10-CM

## 2021-01-29 DIAGNOSIS — Z8249 Family history of ischemic heart disease and other diseases of the circulatory system: Secondary | ICD-10-CM | POA: Diagnosis not present

## 2021-01-29 DIAGNOSIS — I130 Hypertensive heart and chronic kidney disease with heart failure and stage 1 through stage 4 chronic kidney disease, or unspecified chronic kidney disease: Secondary | ICD-10-CM | POA: Diagnosis not present

## 2021-01-29 DIAGNOSIS — Z7982 Long term (current) use of aspirin: Secondary | ICD-10-CM | POA: Insufficient documentation

## 2021-01-29 DIAGNOSIS — I2510 Atherosclerotic heart disease of native coronary artery without angina pectoris: Secondary | ICD-10-CM | POA: Diagnosis not present

## 2021-01-29 DIAGNOSIS — Z7984 Long term (current) use of oral hypoglycemic drugs: Secondary | ICD-10-CM | POA: Diagnosis not present

## 2021-01-29 DIAGNOSIS — R7309 Other abnormal glucose: Secondary | ICD-10-CM | POA: Diagnosis not present

## 2021-01-29 DIAGNOSIS — Z7902 Long term (current) use of antithrombotics/antiplatelets: Secondary | ICD-10-CM | POA: Diagnosis not present

## 2021-01-29 DIAGNOSIS — Z79899 Other long term (current) drug therapy: Secondary | ICD-10-CM | POA: Diagnosis not present

## 2021-01-29 DIAGNOSIS — G4733 Obstructive sleep apnea (adult) (pediatric): Secondary | ICD-10-CM | POA: Insufficient documentation

## 2021-01-29 DIAGNOSIS — Z955 Presence of coronary angioplasty implant and graft: Secondary | ICD-10-CM | POA: Insufficient documentation

## 2021-01-29 DIAGNOSIS — E785 Hyperlipidemia, unspecified: Secondary | ICD-10-CM | POA: Insufficient documentation

## 2021-01-29 DIAGNOSIS — N183 Chronic kidney disease, stage 3 unspecified: Secondary | ICD-10-CM | POA: Diagnosis not present

## 2021-01-29 DIAGNOSIS — I25119 Atherosclerotic heart disease of native coronary artery with unspecified angina pectoris: Secondary | ICD-10-CM | POA: Diagnosis not present

## 2021-01-29 DIAGNOSIS — R0789 Other chest pain: Secondary | ICD-10-CM | POA: Diagnosis not present

## 2021-01-29 LAB — BASIC METABOLIC PANEL
Anion gap: 9 (ref 5–15)
BUN: 10 mg/dL (ref 8–23)
CO2: 28 mmol/L (ref 22–32)
Calcium: 9.3 mg/dL (ref 8.9–10.3)
Chloride: 106 mmol/L (ref 98–111)
Creatinine, Ser: 1.08 mg/dL — ABNORMAL HIGH (ref 0.44–1.00)
GFR, Estimated: 54 mL/min — ABNORMAL LOW (ref >60)
Glucose, Bld: 120 mg/dL — ABNORMAL HIGH (ref 70–99)
Potassium: 3.8 mmol/L (ref 3.5–5.1)
Sodium: 143 mmol/L (ref 135–145)

## 2021-01-29 LAB — LIPID PANEL
Cholesterol: 70 mg/dL (ref 0–200)
HDL: 46 mg/dL (ref >40)
LDL Cholesterol: 7 mg/dL (ref 0–99)
Total CHOL/HDL Ratio: 1.5 RATIO
Triglycerides: 85 mg/dL (ref <150)
VLDL: 17 mg/dL (ref 0–40)

## 2021-01-29 MED ORDER — EMPAGLIFLOZIN 10 MG PO TABS: 10.0000 mg | ORAL_TABLET | Freq: Every day | ORAL | 0 refills | Status: DC

## 2021-01-29 MED ORDER — EMPAGLIFLOZIN 10 MG PO TABS: 10.0000 mg | ORAL_TABLET | Freq: Every day | ORAL | 11 refills | Status: DC

## 2021-01-29 MED ORDER — FUROSEMIDE 20 MG PO TABS: 20.0000 mg | ORAL_TABLET | Freq: Every day | ORAL | 11 refills | Status: DC | PRN

## 2021-01-29 NOTE — Progress Notes (Signed)
PCP: [name], MD Cardiology: Dr. [name] HF Cardiology: Dr. Aundra Dubin  75 y.o. with history of CAD, diastolic CHF, and exertional dyspnea was self-referred to CHF clinic.  I take care of her husband, Awa Theroux.  She has struggled with exertional dyspnea for the last several months.  Last echo in 1/19 showed vigorous LV systolic function with a mild intracavitary gradient.  Given exertional dyspnea and chest pressure, she had LHC in 5/21, showing 95% pRCA stenosis that was treated with DES.   Despite PCI, she did not feel much better.  She continued to have exertional dyspnea and chest pressure.  Her chest pressure was nonexertional and happened at random.  Echo was done in 6/21, showing hyperdynamic LV with EF 65-70%, moderate LVH, normal RV.  I increased her Toprol XL to see if this would help her diastolic function and also started her on low dose Lasix 20 mg daily.  Creatinine rose to 2.33 and Lasix was later stopped, creatinine dropped back to 1.08.  She now takes Lasix 20 mg three times a week.   Cardiolite in 3/22 was a low risk study.   Today she returns for dHF and CAD follow up. She says she feels better after starting Entresto, still has some SOB walking further distances and increased dyspnea with stairs. She also has intermittent atypical chest pressure that resolves spontaneously. Denies increasing dizziness or edema. +Orthopnea. Appetite ok. No fever or chills. Weight at home 180 pounds. She has been off of her metoprolol for 1 week. She finished her sleep study last night.  Labs (5/21): K 4.1, creatinine 1.07. NT-proBNP 721 Labs (6/21): creatinine 2.33 Labs (7/21): LDL 72, K 3.7, creatinine 1.08 Labs (9/21): LDL 93, HDL 40 Labs (2/22): LDL 10, HDL 52 Labs (4/22): K 3.9, creatinine 1.74 Labs (6/22): K 3.5, creatinine 1.10  PMH:  1. Diastolic CHF: Echo in [id number] with EF 65-70%, mild LV intracavitary gradient, mild LVH, very mild AS, RV size and systolic function normal.   - Echo  (6/21): EF 65-70%, moderate LVH, normal RV, mild MR.  2. Hyperlipidemia; Myalgias with atorvastatin, tolerates low dose Crestor.  3. CAD: LHC in 2017 with moderate nonobstructive disease.  - LHC (5/21): 70% D1, 95% pRCA, 60% mLAD, 60% dLAD. DES to RCA.  - Cardiolite (3/22): small partially reversible mid anterior defect, low risk and possibly shifting breast artifact.  4. HTN: Edema with amlodipine.  - Renal artery dopplers in 3/22: No evidence for renal artery stenosis.  5. Gastroparesis 6. OSA 7. CKD stage 3  ROS: All systems reviewed and negative except as per HPI.   Social History   Socioeconomic History   Marital status: Married    Spouse name: Not on file   Number of children: Not on file   Years of education: Not on file   Highest education level: Not on file  Occupational History   Not on file  Tobacco Use   Smoking status: Never   Smokeless tobacco: Never  Substance and Sexual Activity   Alcohol use: No    Alcohol/week: 0.0 standard drinks   Drug use: No   Sexual activity: Not on file  Other Topics Concern   Not on file  Social History Narrative   Not on file   Social Determinants of Health   Financial Resource Strain: Not on file  Food Insecurity: Not on file  Transportation Needs: Not on file  Physical Activity: Not on file  Stress: Not on file  Social Connections: Not on  file  Intimate Partner Violence: Not on file   Family History  Problem Relation Age of Onset   Hypertension Mother    Heart disease Mother    Heart attack Father    Stroke Father    Diabetes Sister    Hypertension Sister    Stroke Other    Diabetes Other    Heart disease Other    Current Outpatient Medications  Medication Sig Dispense Refill   aspirin EC 81 MG tablet Take 81 mg by mouth daily.     clopidogrel (PLAVIX) 75 MG tablet Take 1 tablet (75 mg total) by mouth daily with breakfast. 90 tablet 3   Cyanocobalamin (B-12) 2000 MCG TABS Take 2,000 mcg by mouth daily.       DULoxetine (CYMBALTA) 20 MG capsule Take 20 mg by mouth 2 (two) times daily.      escitalopram (LEXAPRO) 10 MG tablet Take 10 mg by mouth daily.     Evolocumab (REPATHA SURECLICK) XX123456 MG/ML SOAJ Inject 1 pen into the skin every 14 (fourteen) days. 6 mL 3   ezetimibe (ZETIA) 10 MG tablet Take 10 mg by mouth daily.     fluticasone (FLONASE) 50 MCG/ACT nasal spray Place 2 sprays into both nostrils daily as needed for allergies (seasonal allergies).      furosemide (LASIX) 20 MG tablet Take 20 mg by mouth 3 (three) times a week.     HYDROcodone-acetaminophen (NORCO) 10-325 MG tablet Take 1 tablet by mouth every 4 (four) hours as needed.     isosorbide mononitrate (IMDUR) 60 MG 24 hr tablet TAKE 1 TABLET DAILY 90 tablet 3   LORazepam (ATIVAN) 2 MG tablet Take 2 mg by mouth at bedtime.     nitroGLYCERIN (NITROSTAT) 0.4 MG SL tablet Place 0.4 mg under the tongue every 5 (five) minutes as needed for chest pain.     ondansetron (ZOFRAN) 4 MG tablet Take 4 mg by mouth every 8 (eight) hours as needed for nausea or vomiting.     pantoprazole (PROTONIX) 40 MG tablet Take 1 tablet (40 mg total) by mouth 2 (two) times daily. 30 tablet 11   rOPINIRole (REQUIP) 1 MG tablet Take 1-2 mg by mouth See admin instructions. Take 2 tablets (2 mg) by mouth daily at bedtime, may also take 1 tablet (1 mg) in the afternoon 15:00 for restless legs     rosuvastatin (CRESTOR) 10 MG tablet Take 1 tablet (10 mg total) by mouth at bedtime. 90 tablet 3   sacubitril-valsartan (ENTRESTO) 97-103 MG Take 1 tablet by mouth 2 (two) times daily. 60 tablet 6   tiZANidine (ZANAFLEX) 4 MG tablet Take 4 mg by mouth every 8 (eight) hours as needed for muscle spasms.      metoprolol succinate (TOPROL-XL) 100 MG 24 hr tablet Take 1 tablet (100 mg total) by mouth 2 (two) times daily. Take with or immediately following a meal. (Patient not taking: Reported on 01/29/2021) 180 tablet 6   No current facility-administered medications for this encounter.    Wt Readings from Last 3 Encounters:  01/29/21 81.8 kg (180 lb 6.4 oz)  01/28/21 81.6 kg (180 lb)  10/26/20 81.6 kg (179 lb 12.8 oz)   BP (!) 160/110   Pulse 99   Wt 81.8 kg (180 lb 6.4 oz)   SpO2 94%   BMI 29.12 kg/m  General:  NAD. No resp difficulty HEENT: Normal Neck: Supple. No JVD. Carotids 2+ bilat; no bruits. No lymphadenopathy or thryomegaly appreciated. Cor: PMI nondisplaced.  Regular rate & rhythm. No rubs, gallops or murmurs. Lungs: Clear Abdomen: Soft, nontender, nondistended. No hepatosplenomegaly. No bruits or masses. Good bowel sounds. Extremities: No cyanosis, clubbing, rash, edema Neuro: Alert & oriented x 3, cranial nerves grossly intact. Moves all 4 extremities w/o difficulty. Affect pleasant.  Assessment/Plan:  1. Chronic diastolic CHF: Echo in [id number] showed vigorous LV systolic function EF [id number] with intracavity gradient.  Echo in 6/21 showed EF 65-70% with moderate LVH, normal RV.  Symptoms are NYHA class II. She does not appear volume overloaded on exam. - Restart Toprol XL 100 mg bid (she has been off for 1 week). - Start Jardiance 10 mg daily. BMET today, repeat in 10 days.  - Continue Imdur 60 mg daily.  - Continue Entresto 97/103 mg bid. - Continue Lasix 20 mg three times/week for 1 week, then will change to PRN. 2. Coronary artery disease: DES to RCA in 5/21. Cardiolite in 3/22 was low risk. Still with atypical chest pain.  - Stent placed 5/21, she can stop her Plavix. - Continue ASA 81.  - Continue Crestor 10 g daily, Repatha, and Zetia 10 mg daily.  LDL 128 HDL 42 (6/22) however she had been off of her HLD regimen. - Repeat lipids today. 3. CKD stage 3: Creatinine up to 2.33 with Lasix daily, last creatinine in 6/22 was 1.10.    - BMET today.  4. HTN: BP elevated today.  Renal artery dopplers in 3/22 without renal artery stenosis.  - She has not taken her AM medications. - Restart Toprol XL 100 mg bid.  5. OSA: She completed her sleep study this  morning. Waiting to get CPAP.  She has severe OSA, likely contributes to fatigue and HTN.   Followup in 3 months   [place] FNP 01/29/2021  Patient seen with NP, agree with the above note.   She is stable symptomatically with rare atypical chest pain and minimal exertional dyspnea. Weight stable.  BP high today. She has been off Toprol XL.  LDL high recently at 128 but she was not taking her cholesterol meds regularly.   ECG (personally reviewed): NSR, LAFB, poor RWP  General: NAD Neck: No JVD, no thyromegaly or thyroid nodule.  Lungs: Clear to auscultation bilaterally with normal respiratory effort. CV: Nondisplaced PMI.  Heart regular S1/S2, no S3/S4, no murmur.  No peripheral edema.  No carotid bruit.  Normal pedal pulses.  Abdomen: Soft, nontender, no hepatosplenomegaly, no distention.  Skin: Intact without lesions or rashes.  Neurologic: Alert and oriented x 3.  Psych: Normal affect. Extremities: No clubbing or cyanosis.  HEENT: Normal.   She will restart Toprol XL 100 mg bid. I will also have her start Jardiance 10 mg daily given diastolic CHF.  She will need BMET today and again in 10 days.    She is now regularly taking her cholesterol regimen, check lipids today.   She can stop Plavix at this point, > 1 year post PCI.  Continue ASA 81 daily.   She is waiting to get CPAP.   Loralie Champagne 01/29/2021

## 2021-01-29 NOTE — Patient Instructions (Addendum)
EKG done today.  Labs done today. We will contact you only if your labs are abnormal.  START Jardiance '10mg'$  (1 tablet) by mouth daily.   DECREASE Lasix to '20mg'$  (1 tablet) by mouth as needed.   STOP taking Plavix.   No other medication changes were made. Please continue all current medications as prescribed.  Your physician recommends that you schedule a follow-up appointment in: 10 days for a lab only appointment and in 4 months with our APP Clinic here in our office.   If you have any questions or concerns before your next appointment please send [place] a message through [place] or call our office at [phone].    TO LEAVE A MESSAGE FOR THE NURSE SELECT OPTION 2, PLEASE LEAVE A MESSAGE INCLUDING: YOUR NAME DATE OF BIRTH CALL BACK NUMBER REASON FOR CALL**this is important as we prioritize the call backs  YOU WILL RECEIVE A CALL BACK THE SAME DAY AS LONG AS YOU CALL BEFORE 4:00 PM   Do the following things EVERYDAY: Weigh yourself in the morning before breakfast. Write it down and keep it in a log. Take your medicines as prescribed Eat low salt foods--Limit salt (sodium) to 2000 mg per day.  Stay as active as you can everyday Limit all fluids for the day to less than 2 liters   At the [facility], you and your health needs are our priority. As part of our continuing mission to provide you with exceptional heart care, we have created designated Provider Care Teams. These Care Teams include your primary Cardiologist (physician) and Advanced Practice Providers (APPs- Physician Assistants and Nurse Practitioners) who all work together to provide you with the care you need, when you need it.   You may see any of the following providers on your designated Care Team at your next follow up: Dr Glori Bickers Dr Haynes Kerns, NP [name], [place] Audry Riles, PharmD   Please be sure to bring in all your medications bottles to every appointment.

## 2021-01-30 DIAGNOSIS — M5030 Other cervical disc degeneration, unspecified cervical region: Secondary | ICD-10-CM | POA: Diagnosis not present

## 2021-01-30 DIAGNOSIS — G894 Chronic pain syndrome: Secondary | ICD-10-CM | POA: Diagnosis not present

## 2021-01-30 DIAGNOSIS — M961 Postlaminectomy syndrome, not elsewhere classified: Secondary | ICD-10-CM | POA: Diagnosis not present

## 2021-01-30 DIAGNOSIS — M25519 Pain in unspecified shoulder: Secondary | ICD-10-CM | POA: Diagnosis not present

## 2021-01-30 LAB — HEMOGLOBIN A1C
Hgb A1c MFr Bld: 6 % — ABNORMAL HIGH (ref 4.8–5.6)
Mean Plasma Glucose: 126 mg/dL

## 2021-02-07 ENCOUNTER — Ambulatory Visit (HOSPITAL_COMMUNITY)
Admission: RE | Admit: 2021-02-07 | Discharge: 2021-02-07 | Disposition: A | Discharge status: Home / Self Care | Payer: Medicare Other | Source: Ambulatory Visit | Attending: Internal Medicine | Admitting: Internal Medicine

## 2021-02-07 ENCOUNTER — Other Ambulatory Visit: Payer: Self-pay

## 2021-02-07 DIAGNOSIS — N952 Postmenopausal atrophic vaginitis: Secondary | ICD-10-CM | POA: Diagnosis not present

## 2021-02-07 DIAGNOSIS — N898 Other specified noninflammatory disorders of vagina: Secondary | ICD-10-CM | POA: Diagnosis not present

## 2021-02-07 DIAGNOSIS — I5032 Chronic diastolic (congestive) heart failure: Secondary | ICD-10-CM | POA: Diagnosis not present

## 2021-02-07 DIAGNOSIS — Z6828 Body mass index (BMI) 28.0-28.9, adult: Secondary | ICD-10-CM | POA: Diagnosis not present

## 2021-02-07 DIAGNOSIS — R319 Hematuria, unspecified: Secondary | ICD-10-CM | POA: Diagnosis not present

## 2021-02-07 LAB — BASIC METABOLIC PANEL
Anion gap: 9 (ref 5–15)
BUN: 13 mg/dL (ref 8–23)
CO2: 30 mmol/L (ref 22–32)
Calcium: 9.4 mg/dL (ref 8.9–10.3)
Chloride: 104 mmol/L (ref 98–111)
Creatinine, Ser: 1.26 mg/dL — ABNORMAL HIGH (ref 0.44–1.00)
GFR, Estimated: 45 mL/min — ABNORMAL LOW (ref >60)
Glucose, Bld: 117 mg/dL — ABNORMAL HIGH (ref 70–99)
Potassium: 3.9 mmol/L (ref 3.5–5.1)
Sodium: 143 mmol/L (ref 135–145)

## 2021-02-08 ENCOUNTER — Other Ambulatory Visit (HOSPITAL_COMMUNITY): Payer: TRICARE For Life (TFL)

## 2021-02-08 NOTE — Procedures (Signed)
   Patient Name: Angela [name], Angela [name] Date: 01/28/2021 Gender: Female D.O.B: 1945/11/26 Age (years): 30 Referring Provider: Fransico Him MD, ABSM Height (inches): 66 Interpreting Physician: Fransico Him MD, ABSM Weight (lbs): 180 RPSGT: Zadie Rhine BMI: 29 MRN: [id number] Neck Size: 16.00  CLINICAL INFORMATION The patient is referred for a CPAP titration to treat sleep apnea.  SLEEP STUDY TECHNIQUE As per the AASM Manual for the Scoring of Sleep and Associated Events v2.3 (April 2016) with a hypopnea requiring 4% desaturations.  The channels recorded and monitored were frontal, central and occipital EEG, electrooculogram (EOG), submentalis EMG (chin), nasal and oral airflow, thoracic and abdominal wall motion, anterior tibialis EMG, snore microphone, electrocardiogram, and pulse oximetry. Continuous positive airway pressure (CPAP) was initiated at the beginning of the study and titrated to treat sleep-disordered breathing.  MEDICATIONS Medications self-administered by patient taken the night of the study : ATIVAN, REQUIP, ZANAFLEX  TECHNICIAN COMMENTS Comments added by technician: pt had one restroom visted. Patient had difficulty initiating sleep. Patient was restless all through the night. Comments added by scorer: [name]  RESPIRATORY PARAMETERS Optimal PAP Pressure (cm):8  AHI at Optimal Pressure (/hr):0 Overall Minimal O2 (%):86.0  Supine % at Optimal Pressure (%):0 Minimal O2 at Optimal Pressure (%): 90.0   SLEEP ARCHITECTURE The study was initiated at 10:20:43 PM and ended at 4:30:12 AM.  Sleep onset time was 191.9 minutes and the sleep efficiency was 23.1%. The total sleep time was 85.5 minutes.  The patient spent 59.1% of the night in stage N1 sleep, 24.6% in stage N2 sleep, 0.0% in stage N3 and 16.4% in REM.Stage REM latency was 106.0 minutes  Wake after sleep onset was 92.1. Alpha intrusion was absent. Supine sleep was 0.00%.  CARDIAC DATA The 2 lead EKG  demonstrated sinus rhythm. The mean heart rate was 94.6 beats per minute. Other EKG findings include: None.  LEG MOVEMENT DATA The total Periodic Limb Movements of Sleep (PLMS) were 0. The PLMS index was 0.0. A PLMS index of <15 is considered normal in adults.  IMPRESSIONS - The optimal PAP pressure was 8 cm of water. - Moderate oxygen desaturations were observed during this titration (min O2 = 86.0%). - No snoring was audible during this study. - No cardiac abnormalities were observed during this study. - Clinically significant periodic limb movements were not noted during this study. Arousals associated with PLMs were rare.  DIAGNOSIS - Obstructive Sleep Apnea (G47.33)  RECOMMENDATIONS - Trial of CPAP therapy on 8 cm H2O with a Small size Resmed Full Face Mask AirFit F20 mask and heated humidification. - Avoid alcohol, sedatives and other CNS depressants that may worsen sleep apnea and disrupt normal sleep architecture. - Sleep hygiene should be reviewed to assess factors that may improve sleep quality. - Weight management and regular exercise should be initiated or continued. - Return to Sleep Center for re-evaluation after 4 weeks of therapy  [Electronically signed] 02/08/2021 01:04 PM  Fransico Him MD, ABSM Diplomate, American Board of Sleep Medicine

## 2021-02-09 ENCOUNTER — Telehealth: Payer: Self-pay | Admitting: *Deleted

## 2021-02-09 NOTE — Telephone Encounter (Addendum)
The patient has been notified of the result and verbalized understanding.   Marolyn Hammock, [place] 02/09/2021 10:57 PM     Upon patient request DME selection is [place] Patient understands she will be contacted by [facility] to set up her cpap. Patient understands to call if [facility] does not contact him with new setup in a timely manner. Patient understands they will be called once confirmation has been received from [facility] that they have received their new machine to schedule 10 week follow up appointment.   [place] notified of new cpap order  Please add to airview Patient was grateful for the call and thanked me.

## 2021-02-09 NOTE — Telephone Encounter (Signed)
-----   Message from Sueanne Margarita, MD sent at 02/08/2021  1:06 PM EDT ----- Please let patient know that they had a successful PAP titration and let DME know that orders are in EPIC.  Please set up 6 week OV with me.

## 2021-02-26 DIAGNOSIS — G894 Chronic pain syndrome: Secondary | ICD-10-CM | POA: Diagnosis not present

## 2021-02-26 DIAGNOSIS — M961 Postlaminectomy syndrome, not elsewhere classified: Secondary | ICD-10-CM | POA: Diagnosis not present

## 2021-02-26 DIAGNOSIS — M5412 Radiculopathy, cervical region: Secondary | ICD-10-CM | POA: Diagnosis not present

## 2021-02-26 DIAGNOSIS — M5030 Other cervical disc degeneration, unspecified cervical region: Secondary | ICD-10-CM | POA: Diagnosis not present

## 2021-03-11 ENCOUNTER — Encounter: Payer: Self-pay | Admitting: *Deleted

## 2021-03-11 NOTE — Telephone Encounter (Signed)
This encounter was created in error - please disregard.

## 2021-03-11 NOTE — Telephone Encounter (Signed)
Patient called to say she was having trouble returning a call to [facility] for her cpap setup. She was very upset that the customer service was bad. I reached Darlina Guys and she is overseeing this matter. I reached back out to the patient and pt is agreeable to treatment.

## 2021-04-03 DIAGNOSIS — Z79891 Long term (current) use of opiate analgesic: Secondary | ICD-10-CM | POA: Diagnosis not present

## 2021-04-03 DIAGNOSIS — M5412 Radiculopathy, cervical region: Secondary | ICD-10-CM | POA: Diagnosis not present

## 2021-04-03 DIAGNOSIS — Z79899 Other long term (current) drug therapy: Secondary | ICD-10-CM | POA: Diagnosis not present

## 2021-04-03 DIAGNOSIS — M5030 Other cervical disc degeneration, unspecified cervical region: Secondary | ICD-10-CM | POA: Diagnosis not present

## 2021-04-03 DIAGNOSIS — G894 Chronic pain syndrome: Secondary | ICD-10-CM | POA: Diagnosis not present

## 2021-04-03 DIAGNOSIS — M961 Postlaminectomy syndrome, not elsewhere classified: Secondary | ICD-10-CM | POA: Diagnosis not present

## 2021-04-19 DIAGNOSIS — Z20828 Contact with and (suspected) exposure to other viral communicable diseases: Secondary | ICD-10-CM | POA: Diagnosis not present

## 2021-04-23 ENCOUNTER — Other Ambulatory Visit (HOSPITAL_COMMUNITY): Payer: Self-pay | Admitting: Cardiology

## 2021-04-24 ENCOUNTER — Other Ambulatory Visit: Payer: Self-pay | Admitting: Pharmacist

## 2021-04-24 MED ORDER — REPATHA SURECLICK 140 MG/ML ~~LOC~~ SOAJ: 1.0000 "pen " | SUBCUTANEOUS | 3 refills | Status: DC

## 2021-05-01 DIAGNOSIS — M961 Postlaminectomy syndrome, not elsewhere classified: Secondary | ICD-10-CM | POA: Diagnosis not present

## 2021-05-01 DIAGNOSIS — M5412 Radiculopathy, cervical region: Secondary | ICD-10-CM | POA: Diagnosis not present

## 2021-05-01 DIAGNOSIS — G894 Chronic pain syndrome: Secondary | ICD-10-CM | POA: Diagnosis not present

## 2021-05-01 DIAGNOSIS — M5030 Other cervical disc degeneration, unspecified cervical region: Secondary | ICD-10-CM | POA: Diagnosis not present

## 2021-05-14 DIAGNOSIS — Z23 Encounter for immunization: Secondary | ICD-10-CM | POA: Diagnosis not present

## 2021-05-30 ENCOUNTER — Other Ambulatory Visit: Payer: Self-pay

## 2021-05-30 ENCOUNTER — Encounter (HOSPITAL_COMMUNITY): Payer: Self-pay | Admitting: Cardiology

## 2021-05-30 ENCOUNTER — Ambulatory Visit (HOSPITAL_COMMUNITY)
Admission: RE | Admit: 2021-05-30 | Discharge: 2021-05-30 | Disposition: A | Discharge status: Home / Self Care | Payer: Medicare Other | Source: Ambulatory Visit | Attending: Cardiology | Admitting: Cardiology

## 2021-05-30 VITALS — BP 150/70 | HR 61 | Wt 190.8 lb

## 2021-05-30 DIAGNOSIS — I200 Unstable angina: Secondary | ICD-10-CM

## 2021-05-30 DIAGNOSIS — G4733 Obstructive sleep apnea (adult) (pediatric): Secondary | ICD-10-CM | POA: Insufficient documentation

## 2021-05-30 DIAGNOSIS — Z955 Presence of coronary angioplasty implant and graft: Secondary | ICD-10-CM | POA: Diagnosis not present

## 2021-05-30 DIAGNOSIS — Z8249 Family history of ischemic heart disease and other diseases of the circulatory system: Secondary | ICD-10-CM

## 2021-05-30 DIAGNOSIS — I5032 Chronic diastolic (congestive) heart failure: Secondary | ICD-10-CM | POA: Insufficient documentation

## 2021-05-30 DIAGNOSIS — Z79899 Other long term (current) drug therapy: Secondary | ICD-10-CM | POA: Insufficient documentation

## 2021-05-30 DIAGNOSIS — I130 Hypertensive heart and chronic kidney disease with heart failure and stage 1 through stage 4 chronic kidney disease, or unspecified chronic kidney disease: Secondary | ICD-10-CM | POA: Diagnosis not present

## 2021-05-30 DIAGNOSIS — I2510 Atherosclerotic heart disease of native coronary artery without angina pectoris: Secondary | ICD-10-CM | POA: Diagnosis not present

## 2021-05-30 DIAGNOSIS — N183 Chronic kidney disease, stage 3 unspecified: Secondary | ICD-10-CM | POA: Diagnosis not present

## 2021-05-30 LAB — BASIC METABOLIC PANEL
Anion gap: 7 (ref 5–15)
BUN: 13 mg/dL (ref 8–23)
CO2: 26 mmol/L (ref 22–32)
Calcium: 8.7 mg/dL — ABNORMAL LOW (ref 8.9–10.3)
Chloride: 105 mmol/L (ref 98–111)
Creatinine, Ser: 1.2 mg/dL — ABNORMAL HIGH (ref 0.44–1.00)
GFR, Estimated: 48 mL/min — ABNORMAL LOW (ref >60)
Glucose, Bld: 115 mg/dL — ABNORMAL HIGH (ref 70–99)
Potassium: 3.7 mmol/L (ref 3.5–5.1)
Sodium: 138 mmol/L (ref 135–145)

## 2021-05-30 LAB — BRAIN NATRIURETIC PEPTIDE: B Natriuretic Peptide: 144.3 pg/mL — ABNORMAL HIGH (ref 0.0–100.0)

## 2021-05-30 NOTE — Patient Instructions (Signed)
Medication Changes:  No change  Lab Work:  Labs done today, your results will be available in MyChart, we will contact you for abnormal readings.   Testing/Procedures:  Your physician has requested that you have an echocardiogram. Echocardiography is a painless test that uses sound waves to create images of your heart. It provides your doctor with information about the size and shape of your heart and how well your hearts chambers and valves are working. This procedure takes approximately one hour. There are no restrictions for this procedure.   Referrals:  You have been referred to [facility]    Special Instructions // Education:  Daily Blood Pressure checks for 2 weeks. Call the office with the readings.  Follow-Up in: 6 months (June 2023) ** Call in May for appointment**  At the [facility], you and your health needs are our priority. We have a designated team specialized in the treatment of Heart Failure. This Care Team includes your primary Heart Failure Specialized Cardiologist (physician), Advanced Practice Providers (APPs- Physician Assistants and Nurse Practitioners), and Pharmacist who all work together to provide you with the care you need, when you need it.   You may see any of the following providers on your designated Care Team at your next follow up:  Dr Glori Bickers Dr Haynes Kerns, NP [name], [place] [facility] [address] [place], [place] Audry Riles, PharmD   Please be sure to bring in all your medications bottles to every appointment.   Need to Contact us:  If you have any questions or concerns before your next appointment please send [place] a message through [place] or call our office at [phone].    TO LEAVE A MESSAGE FOR THE NURSE SELECT OPTION 2, PLEASE LEAVE A MESSAGE INCLUDING: YOUR NAME DATE OF BIRTH CALL BACK NUMBER REASON FOR CALL**this is important as we prioritize the call backs  YOU WILL RECEIVE A  CALL BACK THE SAME DAY AS LONG AS YOU CALL BEFORE 4:00 PM

## 2021-05-30 NOTE — Progress Notes (Signed)
PCP: [name], MD Cardiology: Dr. Aundra Dubin  75 y.o. with history of CAD, diastolic CHF, and exertional dyspnea was self-referred to CHF clinic.  I take care of her husband, Angela [name].  She has struggled with exertional dyspnea for the last several months.  Last echo in 1/19 showed vigorous LV systolic function with a mild intracavitary gradient.  Given exertional dyspnea and chest pressure, she had LHC in 5/21, showing 95% pRCA stenosis that was treated with DES.   Despite PCI, she did not feel much better.  She continued to have exertional dyspnea and chest pressure.  Her chest pressure was nonexertional and happened at random.  Echo was done in 6/21, showing hyperdynamic LV with EF 65-70%, moderate LVH, normal RV.  I increased her Toprol XL to see if this would help her diastolic function and also started her on low dose Lasix 20 mg daily.  Creatinine rose to 2.33 and Lasix was later stopped, creatinine dropped back to 1.08.  She now takes Lasix 20 mg three times a week.   Cardiolite in 3/22 was a low risk study.   She was unable to tolerate Jardiance due to recurrent yeast infections.   Today she returns for diastolic CHF and CAD follow up. She has a chronic pattern of atypical chest pain where she will get central chest heaviness radiating to her back lasting for a few seconds, no trigger (not exertional).  She has had this for years.  She also gets chronically short of breath walking about 100 yards to the mailbox. She is short of breath with stairs. This is also chronic.  Weight is up 10 lbs.  She is using CPAP. BP is elevated today but usually runs < 659 systolic at home.   Labs (5/21): K 4.1, creatinine 1.07. NT-proBNP 721 Labs (6/21): creatinine 2.33 Labs (7/21): LDL 72, K 3.7, creatinine 1.08 Labs (9/21): LDL 93, HDL 40 Labs (2/22): LDL 10, HDL 52 Labs (4/22): K 3.9, creatinine 1.74 Labs (6/22): K 3.5, creatinine 1.10 Labs (8/22): LDL 7 Labs (9/22): K 3.9, creatinine 1.26  ECG  (personally reviewed): NSR, LAFB, LVH  PMH:  1. Diastolic CHF: Echo in 9/35 with EF 65-70%, mild LV intracavitary gradient, mild LVH, very mild AS, RV size and systolic function normal.   - Echo (6/21): EF 65-70%, moderate LVH, normal RV, mild MR.  2. Hyperlipidemia; Myalgias with atorvastatin, tolerates low dose Crestor.  3. CAD: LHC in 2017 with moderate nonobstructive disease.  - LHC (5/21): 70% D1, 95% pRCA, 60% mLAD, 60% dLAD. DES to RCA.  - Cardiolite (3/22): small partially reversible mid anterior defect, low risk and possibly shifting breast artifact.  4. HTN: Edema with amlodipine.  - Renal artery dopplers in 3/22: No evidence for renal artery stenosis.  5. Gastroparesis 6. OSA 7. CKD stage 3  ROS: All systems reviewed and negative except as per HPI.   Social History   Socioeconomic History   Marital status: Married    Spouse name: Not on file   Number of children: Not on file   Years of education: Not on file   Highest education level: Not on file  Occupational History   Not on file  Tobacco Use   Smoking status: Never   Smokeless tobacco: Never  Substance and Sexual Activity   Alcohol use: No    Alcohol/week: 0.0 standard drinks   Drug use: No   Sexual activity: Not on file  Other Topics Concern   Not on file  Social  History Narrative   Not on file   Social Determinants of Health   Financial Resource Strain: Not on file  Food Insecurity: Not on file  Transportation Needs: Not on file  Physical Activity: Not on file  Stress: Not on file  Social Connections: Not on file  Intimate Partner Violence: Not on file   Family History  Problem Relation Age of Onset   Hypertension Mother    Heart disease Mother    Heart attack Father    Stroke Father    Diabetes Sister    Hypertension Sister    Stroke Other    Diabetes Other    Heart disease Other    Current Outpatient Medications  Medication Sig Dispense Refill   aspirin EC 81 MG tablet Take 81 mg by  mouth daily.     DULoxetine (CYMBALTA) 20 MG capsule Take 20 mg by mouth 2 (two) times daily.      escitalopram (LEXAPRO) 10 MG tablet Take 10 mg by mouth daily.     esomeprazole (NEXIUM) 40 MG capsule Take 40 mg by mouth daily at 12 noon.     Evolocumab (REPATHA SURECLICK) 580 MG/ML SOAJ Inject 1 pen into the skin every 14 (fourteen) days. 6 mL 3   ezetimibe (ZETIA) 10 MG tablet Take 10 mg by mouth daily.     fluticasone (FLONASE) 50 MCG/ACT nasal spray Place 2 sprays into both nostrils daily as needed for allergies (seasonal allergies).      furosemide (LASIX) 20 MG tablet Take 1 tablet (20 mg total) by mouth daily as needed for edema or fluid. 30 tablet 11   HYDROcodone-acetaminophen (NORCO) 10-325 MG tablet Take 1 tablet by mouth every 4 (four) hours as needed.     isosorbide mononitrate (IMDUR) 60 MG 24 hr tablet TAKE 1 TABLET DAILY 90 tablet 3   LORazepam (ATIVAN) 2 MG tablet Take 2 mg by mouth at bedtime.     metoprolol succinate (TOPROL-XL) 100 MG 24 hr tablet TAKE 1 TABLET TWICE A DAY,TAKE WITH OR IMMEDIATELY FOLLOWING A MEAL (DISCONTINUE METOPROLOL TARTRATE) 180 tablet 3   nitroGLYCERIN (NITROSTAT) 0.4 MG SL tablet Place 0.4 mg under the tongue every 5 (five) minutes as needed for chest pain.     ondansetron (ZOFRAN) 4 MG tablet Take 4 mg by mouth every 8 (eight) hours as needed for nausea or vomiting.     rOPINIRole (REQUIP) 1 MG tablet Take 1-2 mg by mouth See admin instructions. Take 2 tablets (2 mg) by mouth daily at bedtime, may also take 1 tablet (1 mg) in the afternoon 15:00 for restless legs     rosuvastatin (CRESTOR) 10 MG tablet Take 1 tablet (10 mg total) by mouth at bedtime. 90 tablet 3   sacubitril-valsartan (ENTRESTO) 97-103 MG Take 1 tablet by mouth 2 (two) times daily. 60 tablet 6   tiZANidine (ZANAFLEX) 4 MG tablet Take 4 mg by mouth every 8 (eight) hours as needed for muscle spasms.      No current facility-administered medications for this encounter.   Wt Readings  from Last 3 Encounters:  05/30/21 86.5 kg (190 lb 12.8 oz)  01/29/21 81.8 kg (180 lb 6.4 oz)  01/28/21 81.6 kg (180 lb)   BP (!) 150/70    Pulse 61    Wt 86.5 kg (190 lb 12.8 oz)    SpO2 96%    BMI 30.80 kg/m  General: NAD Neck: No JVD, no thyromegaly or thyroid nodule.  Lungs: Clear to auscultation bilaterally with  normal respiratory effort. CV: Nondisplaced PMI.  Heart regular S1/S2, no S3/S4, no murmur.  No peripheral edema.  No carotid bruit.  Normal pedal pulses.  Abdomen: Soft, nontender, no hepatosplenomegaly, no distention.  Skin: Intact without lesions or rashes.  Neurologic: Alert and oriented x 3.  Psych: Normal affect. Extremities: No clubbing or cyanosis.  HEENT: Normal.   Assessment/Plan:  1. Chronic diastolic CHF: Echo in 2/95 showed vigorous LV systolic function EF 62-13% with intracavity gradient.  Echo in 6/21 showed EF 65-70% with moderate LVH, normal RV.  Symptoms are NYHA class II-III, chronic. She does not appear volume overloaded on exam.  Deconditioning may play a significant role.  - Continue Toprol XL 100 mg bid. - Unable to tolerate Jardiance due to yeast infections.  - Continue Imdur 60 mg daily.  - Continue Entresto 97/103 mg bid. - Continue Lasix 20 mg three times/week.  BMET/BNP today.  - Check echo with ongoing dyspnea.  - Refer for [employer] class, needs more activity.  2. Coronary artery disease: DES to RCA in 5/21. Cardiolite in 3/22 was low risk (small partially reversible mid-anterior defect may be shifting breast artifact). Still with atypical chest pain, this has not changed.   - If chest pain or dyspnea change/worsen in future, next step would be RHC/LHC.  - Continue ASA 81.  - Continue Crestor 10 g daily, Repatha, and Zetia 10 mg daily.  LDL was very low in 8/22.  3. CKD stage 3: Creatinine up to 2.33 with Lasix daily, last creatinine in 9/22 was 1.26.    - BMET today.  4. HTN: BP elevated today but per her report is controlled at home.  Renal  artery dopplers in 3/22 without renal artery stenosis.  - Check BP daily x 2 wks, call in readings.  5. OSA: Continue CPAP.   Followup in 6 months   Angela [name]  05/30/2021

## 2021-05-31 ENCOUNTER — Telehealth: Payer: Self-pay

## 2021-05-31 NOTE — Telephone Encounter (Signed)
VMT pt requesting call back to discuss PREP referral 

## 2021-06-04 DIAGNOSIS — M961 Postlaminectomy syndrome, not elsewhere classified: Secondary | ICD-10-CM | POA: Diagnosis not present

## 2021-06-04 DIAGNOSIS — M5412 Radiculopathy, cervical region: Secondary | ICD-10-CM | POA: Diagnosis not present

## 2021-06-04 DIAGNOSIS — M5030 Other cervical disc degeneration, unspecified cervical region: Secondary | ICD-10-CM | POA: Diagnosis not present

## 2021-06-04 DIAGNOSIS — G894 Chronic pain syndrome: Secondary | ICD-10-CM | POA: Diagnosis not present

## 2021-06-05 ENCOUNTER — Other Ambulatory Visit (HOSPITAL_COMMUNITY): Payer: Self-pay | Admitting: Cardiology

## 2021-06-09 ENCOUNTER — Encounter (HOSPITAL_COMMUNITY): Payer: Self-pay | Admitting: Cardiology

## 2021-06-10 ENCOUNTER — Other Ambulatory Visit (HOSPITAL_COMMUNITY): Payer: Self-pay | Admitting: *Deleted

## 2021-06-10 MED ORDER — NITROGLYCERIN 0.4 MG SL SUBL: 0.4000 mg | SUBLINGUAL_TABLET | SUBLINGUAL | 0 refills | Status: DC | PRN

## 2021-06-13 ENCOUNTER — Telehealth: Payer: Self-pay

## 2021-06-13 ENCOUNTER — Encounter (HOSPITAL_COMMUNITY): Payer: Self-pay | Admitting: Cardiology

## 2021-06-13 IMAGING — CT CT ABDOMEN AND PELVIS WITH CONTRAST
2 of 6 series · 16 of 46 positions shown, 18 images · IV contrast (agent unspecified)
Comparison: 05/29/2017, 05/16/2016

CLINICAL DATA: Acute generalized abdominal pain, nausea and
vomiting since [REDACTED] night, history hypertension, irritable bowel
syndrome, colon cancer

EXAM:
CT ABDOMEN AND PELVIS WITH CONTRAST
TECHNIQUE: Multidetector CT imaging of the abdomen and pelvis was performed
using the standard protocol following bolus administration of
intravenous contrast. Sagittal and coronal MPR images reconstructed
from axial data set.
CONTRAST:  80 cc [id number] IV

[Series 3: a/p w/ 5mm · axial · 0.87mm/px · z∈[+764,+1234]mm · 13 of 110 slices shown, 15 images]
[im 8/110  soft-tissue]
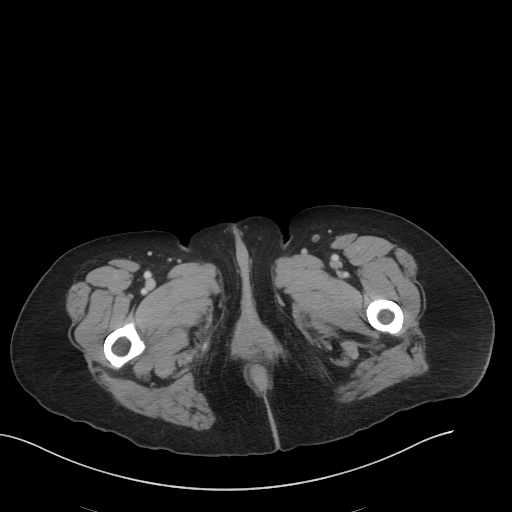
[im 8/110  bone]
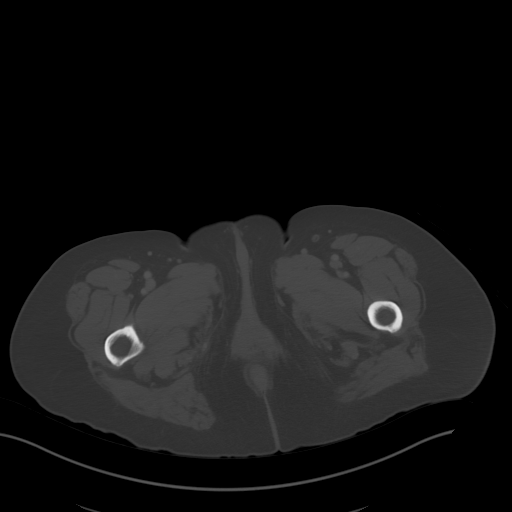
[im 15/110  soft-tissue]
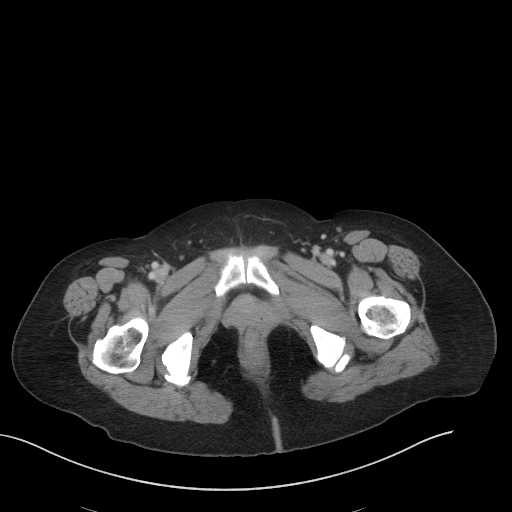
[im 22/110  soft-tissue]
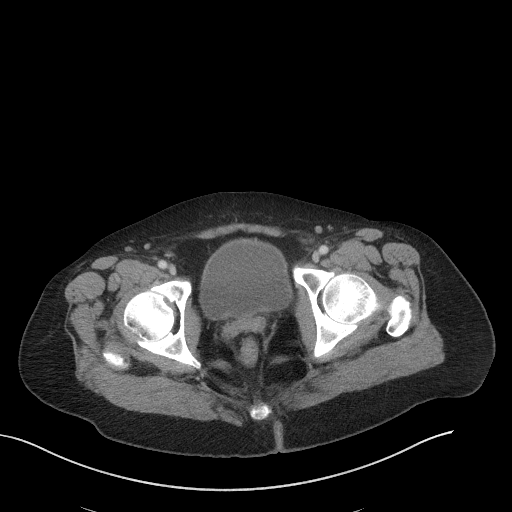
[im 30/110  soft-tissue]
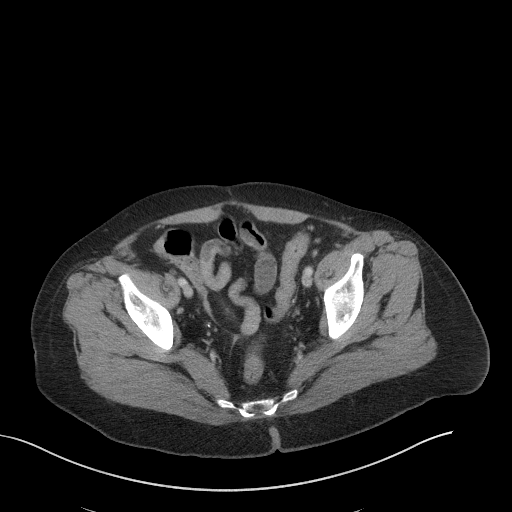
[im 37/110  soft-tissue]
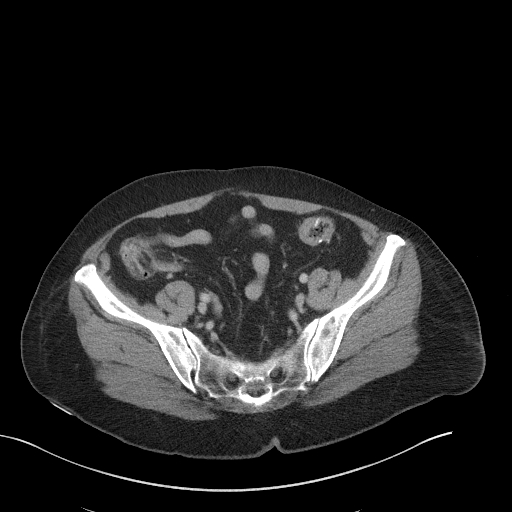
[im 44/110  soft-tissue]
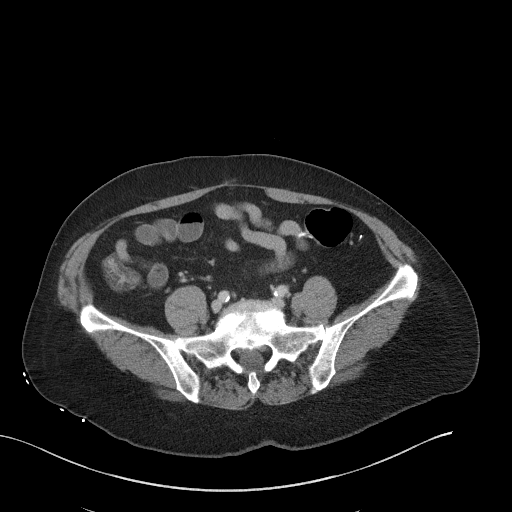
[im 59/110  soft-tissue]
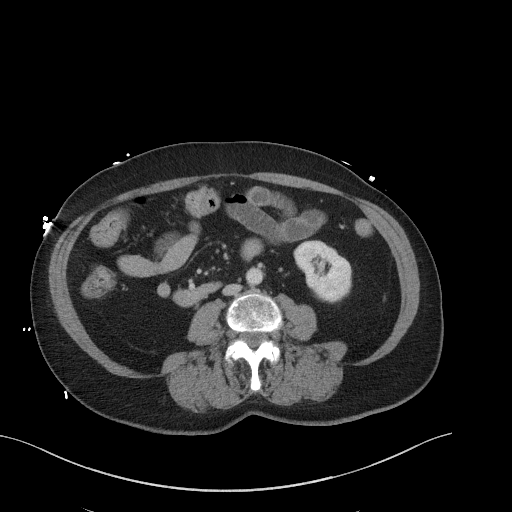
[im 66/110  soft-tissue]
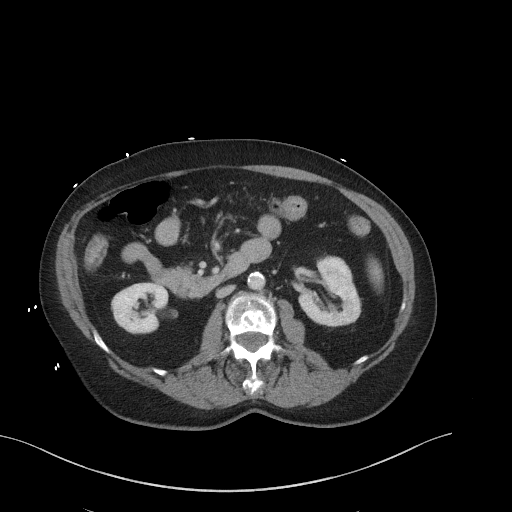
[im 73/110  soft-tissue]
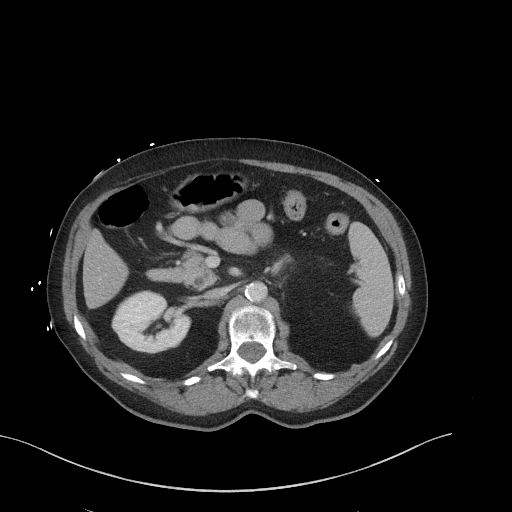
[im 73/110  bone]
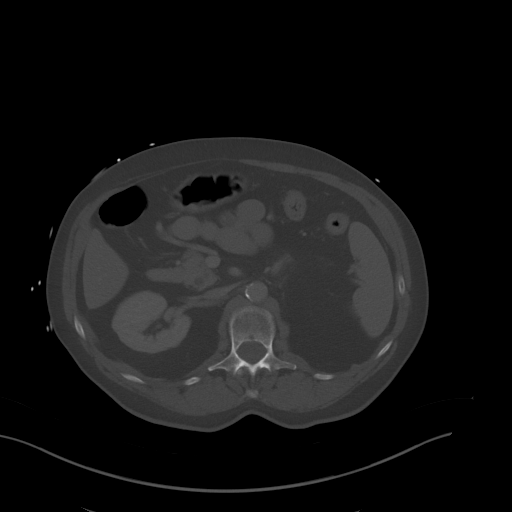
[im 80/110  soft-tissue]
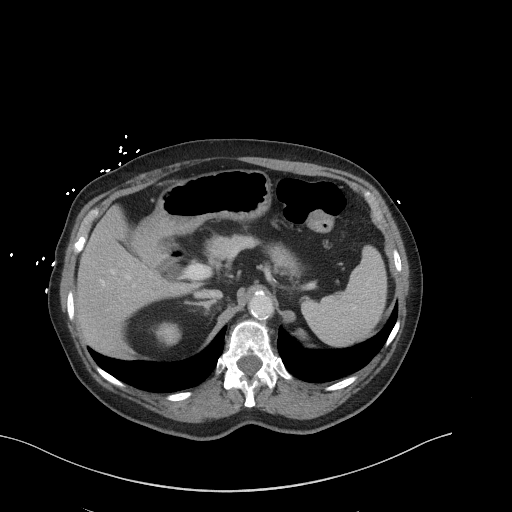
[im 88/110  soft-tissue]
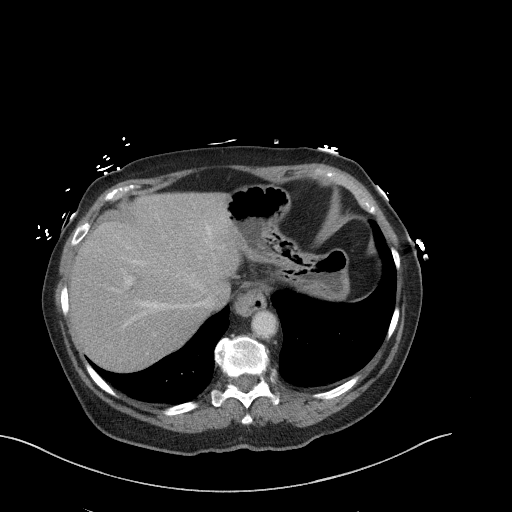
[im 95/110  soft-tissue]
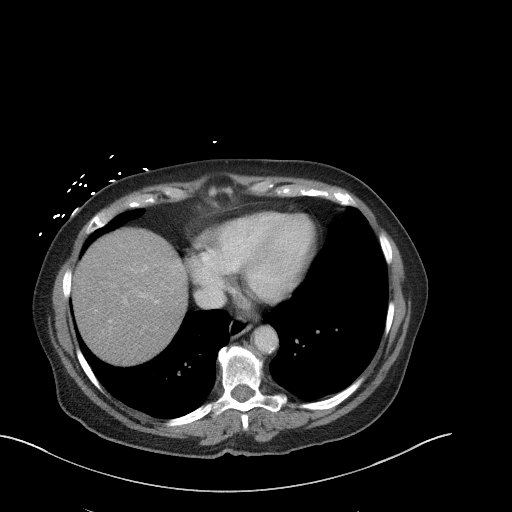
[im 102/110  soft-tissue]
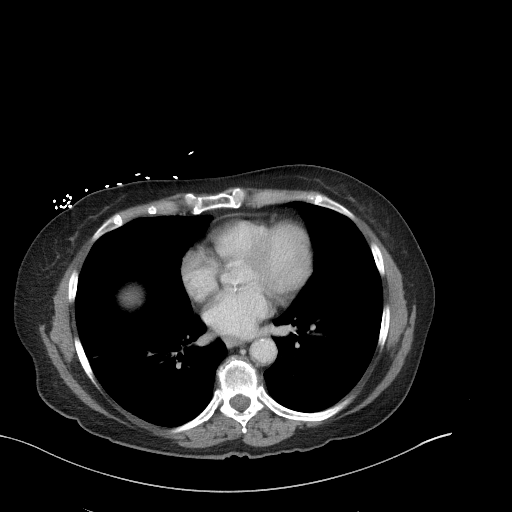

[Series 6: a/p w/ cor · coronal · 1.05mm/px · 3 of 187 slices shown]
[im 63/187  soft-tissue]
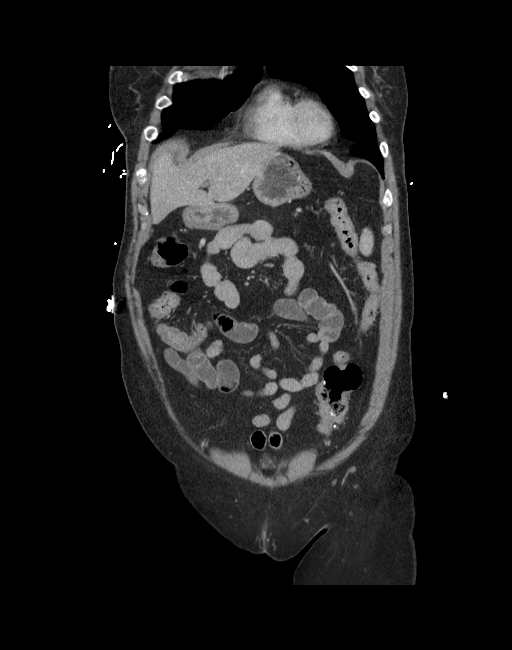
[im 83/187  soft-tissue]
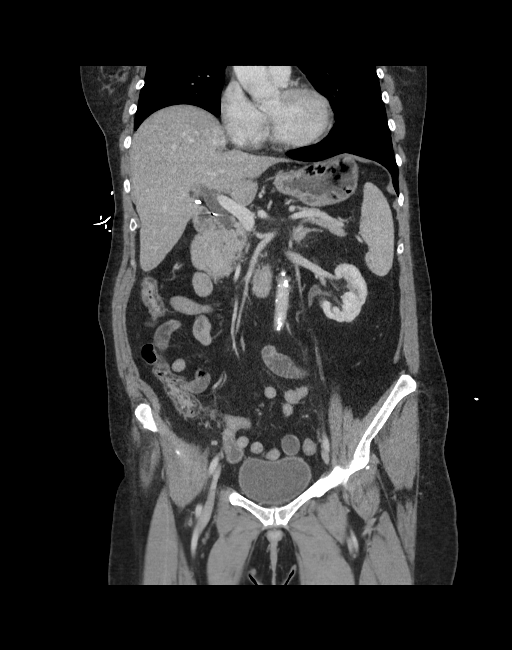
[im 104/187  soft-tissue]
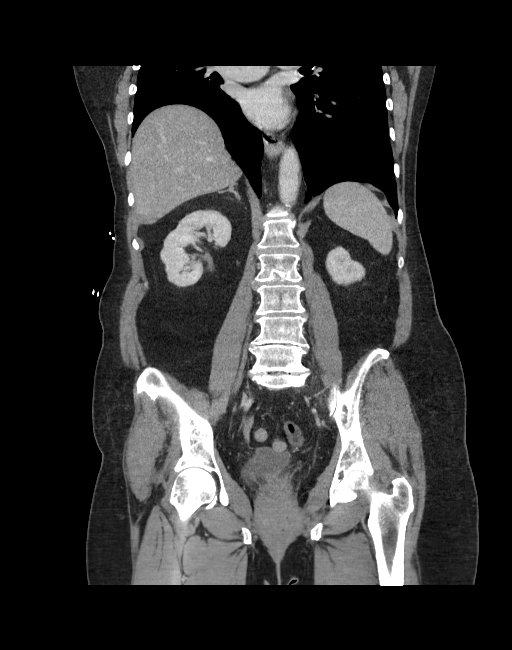

[16 of 46 positions shown; findings below may reference images not displayed]

FINDINGS: Lower chest: Minimal subsegmental atelectasis at RIGHT lower lobe.
Nodular density LEFT major fissure 4 mm diameter image 7 unchanged.

Hepatobiliary: Gallbladder surgically absent. Focal fatty
infiltration of liver adjacent to falciform fissure. Liver otherwise
unremarkable.

Pancreas: Normal appearance

Spleen: Normal appearance

Adrenals/Urinary Tract: Adrenal glands normal appearance. Small
BILATERAL renal cysts without solid renal mass or hydronephrosis. No
urinary tract calcification or dilatation. Ureters and bladder
unremarkable

Stomach/Bowel: Small hiatal hernia. Anastomotic staple line at
descending sigmoid junction. Appendix not visualized but no
pericecal inflammatory process seen. Stomach and bowel loops
otherwise normal appearance

Vascular/Lymphatic: Atherosclerotic calcifications aorta and iliac
arteries without aneurysm. No adenopathy.

Reproductive: Uterus surgically absent with nonvisualization of LEFT
ovary. RIGHT ovary normal appearance

Other: No free air or free fluid. Small periumbilical hernia
containing fat. No acute inflammatory process identified.

Musculoskeletal: Unremarkable
IMPRESSION: No acute intra-abdominal or intrapelvic abnormalities.

Small hiatal hernia.

Small periumbilical hernia containing fat.

Stable 4 mm diameter nodular density at LEFT major fissure unchanged
since [DATE].

## 2021-06-13 NOTE — Telephone Encounter (Signed)
[facility] pt returning call about PREP  LVMT pt. Will retry tomorrow to connect

## 2021-06-14 ENCOUNTER — Other Ambulatory Visit (HOSPITAL_COMMUNITY): Payer: Self-pay | Admitting: *Deleted

## 2021-06-14 MED ORDER — AMLODIPINE BESYLATE 5 MG PO TABS: 5.0000 mg | ORAL_TABLET | Freq: Every day | ORAL | 3 refills | Status: DC

## 2021-06-17 ENCOUNTER — Other Ambulatory Visit (HOSPITAL_COMMUNITY): Payer: Self-pay

## 2021-06-17 MED ORDER — SPIRONOLACTONE 25 MG PO TABS: 25.0000 mg | ORAL_TABLET | Freq: Every day | ORAL | 3 refills | Status: DC

## 2021-07-03 DIAGNOSIS — M961 Postlaminectomy syndrome, not elsewhere classified: Secondary | ICD-10-CM | POA: Diagnosis not present

## 2021-07-03 DIAGNOSIS — Z79899 Other long term (current) drug therapy: Secondary | ICD-10-CM | POA: Diagnosis not present

## 2021-07-03 DIAGNOSIS — G894 Chronic pain syndrome: Secondary | ICD-10-CM | POA: Diagnosis not present

## 2021-07-03 DIAGNOSIS — M5412 Radiculopathy, cervical region: Secondary | ICD-10-CM | POA: Diagnosis not present

## 2021-07-03 DIAGNOSIS — Z79891 Long term (current) use of opiate analgesic: Secondary | ICD-10-CM | POA: Diagnosis not present

## 2021-07-03 DIAGNOSIS — M5030 Other cervical disc degeneration, unspecified cervical region: Secondary | ICD-10-CM | POA: Diagnosis not present

## 2021-07-06 NOTE — Progress Notes (Incomplete)
***In Progress***    Advanced Heart Failure Clinic Note  PCP: [name], MD Cardiology: Dr. Aundra Dubin  HPI:  76 y.o. with history of CAD, diastolic CHF, and exertional dyspnea was self-referred to CHF clinic.  I take care of her husband, Ladana Chavero.  She has struggled with exertional dyspnea for the last several months.  Last echo in 1/19 showed vigorous LV systolic function with a mild intracavitary gradient.  Given exertional dyspnea and chest pressure, she had LHC in 5/21, showing 95% pRCA stenosis that was treated with DES.    Despite PCI, she did not feel much better.  She continued to have exertional dyspnea and chest pressure.  Her chest pressure was nonexertional and happened at random.  Echo was done in 6/21, showing hyperdynamic LV with EF 65-70%, moderate LVH, normal RV.  I increased her Toprol XL to see if this would help her diastolic function and also started her on low dose Lasix 20 mg daily. Creatinine rose to 2.33 and Lasix was later stopped, creatinine dropped back to 1.08.  She now takes Lasix 20 mg three times a week.    Cardiolite in 3/22 was a low risk study.    She was unable to tolerate Jardiance due to recurrent yeast infections.    Today she returns to clinic in 40/9811 for diastolic CHF and CAD follow up. She has had a chronic pattern of atypical chest pain where she will get central chest heaviness radiating to her back lasting for a few seconds, no trigger (not exertional) for years.  She gets chronically short of breath walking about 100 yards to the mailbox or stairs. Weight was up 10 lbs.  She was using CPAP. BP was elevated but usually runs < 914 systolic at home.   Today he returns to HF clinic for pharmacist medication titration. At last visit with MD patient was instructed to start exercise classes at the [facility] prep. Spironolactone 25 mg daily was started on 06/17/2021.   Plan Labs BMET- K, Scr Meds- HTN- spiro inc to 50 mg, hydral25 TID?,amlodipine  10? F/u- 1-2 weeks spiro labs,   Overall feeling ***. Dizziness, lightheadedness, fatigue:  Chest pain or palpitations:  How is your breathing?: *** SOB: Able to complete all ADLs. Activity level ***  Weight at home pounds. Takes furosemide/torsemide/bumex *** mg *** daily.  LEE PND/Orthopnea  Appetite *** Low-salt diet:   Physical Exam Cost/affordability of meds   PMH:  1. Diastolic CHF: Echo in 7/82 with EF 65-70%, mild LV intracavitary gradient, mild LVH, very mild AS, RV size and systolic function normal.   - Echo (6/21): EF 65-70%, moderate LVH, normal RV, mild MR.  2. Hyperlipidemia; Myalgias with atorvastatin, tolerates low dose Crestor.  3. CAD: LHC in 2017 with moderate nonobstructive disease.  - LHC (5/21): 70% D1, 95% pRCA, 60% mLAD, 60% dLAD. DES to RCA.  - Cardiolite (3/22): small partially reversible mid anterior defect, low risk and possibly shifting breast artifact.  4. HTN: Edema with amlodipine.  - Renal artery dopplers in 3/22: No evidence for renal artery stenosis.  5. Gastroparesis 6. OSA 7. CKD stage 3  HF Medications: Metoprolol succinate 100 mg BID Entresto 97/103 mg BID Spironolactone 25 mg daily Imdur 60 mg daily Lasix 20 mg 3 times a week  Has the patient been experiencing any side effects to the medications prescribed?  {YES NO:22349}  Does the patient have any problems obtaining medications due to transportation or finances?   {YES NO:22349}  Understanding  of regimen: {excellent/good/fair/poor:19665} Understanding of indications: {excellent/good/fair/poor:19665} Potential of compliance: {excellent/good/fair/poor:19665} Patient understands to avoid NSAIDs. Patient understands to avoid decongestants.    Pertinent Lab Values: Serum creatinine ***, BUN ***, Potassium ***, Sodium ***, BNP ***, Magnesium *** Labs (5/21): K 4.1, creatinine 1.07. NT-proBNP 721 Labs (6/21): creatinine 2.33 Labs (7/21): LDL 72, K 3.7, creatinine 1.08 Labs  (9/21): LDL 93, HDL 40 Labs (2/22): LDL 10, HDL 52 Labs (4/22): K 3.9, creatinine 1.74 Labs (6/22): K 3.5, creatinine 1.10 Labs (8/22): LDL 7 Labs (9/22): K 3.9, creatinine 1.26 Labs (12/2): K 3.7, creatine 1.20  Vital Signs: Weight: *** (last clinic weight: 190.8lbs) Blood pressure: *** 150/70, 140/80s Heart rate: *** 61  Assessment/Plan: 1. Chronic diastolic CHF: Echo in 0/48 showed vigorous LV systolic function EF 88-91% with intracavity gradient.  Echo in 6/21 showed EF 65-70% with moderate LVH, normal RV.  Symptoms are NYHA class II-III, chronic. She does not appear volume overloaded on exam.  Deconditioning may play a significant role.  - Continue Toprol XL 100 mg bid. - Continue Imdur 60 mg daily.  - Continue Entresto 97/103 mg bid. -Continue Spironolactone 25 mg daily - Unable to tolerate Jardiance due to yeast infections.  - Continue Lasix 20 mg three times/week.  BMET/BNP today.  - Refer for [employer] class, needs more activity.  2. Coronary artery disease: DES to RCA in 5/21. Cardiolite in 3/22 was low risk (small partially reversible mid-anterior defect may be shifting breast artifact). Still with atypical chest pain, this has not changed.   - If chest pain or dyspnea change/worsen in future, next step would be RHC/LHC.  - Continue ASA 81.  - Continue Crestor 10 g daily, Repatha, and Zetia 10 mg daily.  LDL was very low in 8/22.  3. CKD stage 3: Creatinine up to 2.33 with Lasix daily, last creatinine in 12/22 was 1.2.     4. HTN: BP elevated today but per her report is controlled at home.  Renal artery dopplers in 3/22 without renal artery stenosis.  - Check BP daily x 2 wks, call in readings.  5. OSA: Continue CPAP.   Follow up *** 6 months with Dr. Rush Farmer, PharmD, BCPS, [facility], [place] [facility] Pharmacist [phone]

## 2021-07-08 ENCOUNTER — Ambulatory Visit (HOSPITAL_BASED_OUTPATIENT_CLINIC_OR_DEPARTMENT_OTHER)
Admission: RE | Admit: 2021-07-08 | Discharge: 2021-07-08 | Disposition: A | Discharge status: Home / Self Care | Payer: Medicare Other | Source: Ambulatory Visit | Attending: Internal Medicine | Admitting: Internal Medicine

## 2021-07-08 ENCOUNTER — Other Ambulatory Visit: Payer: Self-pay

## 2021-07-08 ENCOUNTER — Ambulatory Visit (HOSPITAL_COMMUNITY)
Admission: RE | Admit: 2021-07-08 | Discharge: 2021-07-08 | Disposition: A | Discharge status: Home / Self Care | Payer: Medicare Other | Source: Ambulatory Visit | Attending: Internal Medicine | Admitting: Internal Medicine

## 2021-07-08 VITALS — BP 160/92 | HR 61 | Wt 190.2 lb

## 2021-07-08 DIAGNOSIS — K3184 Gastroparesis: Secondary | ICD-10-CM | POA: Insufficient documentation

## 2021-07-08 DIAGNOSIS — R7303 Prediabetes: Secondary | ICD-10-CM | POA: Diagnosis not present

## 2021-07-08 DIAGNOSIS — Z955 Presence of coronary angioplasty implant and graft: Secondary | ICD-10-CM | POA: Insufficient documentation

## 2021-07-08 DIAGNOSIS — I130 Hypertensive heart and chronic kidney disease with heart failure and stage 1 through stage 4 chronic kidney disease, or unspecified chronic kidney disease: Secondary | ICD-10-CM | POA: Insufficient documentation

## 2021-07-08 DIAGNOSIS — Z7982 Long term (current) use of aspirin: Secondary | ICD-10-CM | POA: Insufficient documentation

## 2021-07-08 DIAGNOSIS — N183 Chronic kidney disease, stage 3 unspecified: Secondary | ICD-10-CM | POA: Insufficient documentation

## 2021-07-08 DIAGNOSIS — G4733 Obstructive sleep apnea (adult) (pediatric): Secondary | ICD-10-CM | POA: Insufficient documentation

## 2021-07-08 DIAGNOSIS — R0789 Other chest pain: Secondary | ICD-10-CM | POA: Insufficient documentation

## 2021-07-08 DIAGNOSIS — Z9989 Dependence on other enabling machines and devices: Secondary | ICD-10-CM | POA: Diagnosis not present

## 2021-07-08 DIAGNOSIS — R0600 Dyspnea, unspecified: Secondary | ICD-10-CM | POA: Insufficient documentation

## 2021-07-08 DIAGNOSIS — I5032 Chronic diastolic (congestive) heart failure: Secondary | ICD-10-CM | POA: Insufficient documentation

## 2021-07-08 DIAGNOSIS — Z79899 Other long term (current) drug therapy: Secondary | ICD-10-CM | POA: Insufficient documentation

## 2021-07-08 DIAGNOSIS — G4700 Insomnia, unspecified: Secondary | ICD-10-CM | POA: Diagnosis not present

## 2021-07-08 DIAGNOSIS — I2510 Atherosclerotic heart disease of native coronary artery without angina pectoris: Secondary | ICD-10-CM | POA: Insufficient documentation

## 2021-07-08 DIAGNOSIS — E785 Hyperlipidemia, unspecified: Secondary | ICD-10-CM | POA: Diagnosis not present

## 2021-07-08 DIAGNOSIS — I10 Essential (primary) hypertension: Secondary | ICD-10-CM | POA: Diagnosis not present

## 2021-07-08 DIAGNOSIS — I5022 Chronic systolic (congestive) heart failure: Secondary | ICD-10-CM

## 2021-07-08 LAB — BASIC METABOLIC PANEL
Anion gap: 7 (ref 5–15)
BUN: 14 mg/dL (ref 8–23)
CO2: 27 mmol/L (ref 22–32)
Calcium: 8.7 mg/dL — ABNORMAL LOW (ref 8.9–10.3)
Chloride: 107 mmol/L (ref 98–111)
Creatinine, Ser: 1.25 mg/dL — ABNORMAL HIGH (ref 0.44–1.00)
GFR, Estimated: 45 mL/min — ABNORMAL LOW (ref >60)
Glucose, Bld: 97 mg/dL (ref 70–99)
Potassium: 4.2 mmol/L (ref 3.5–5.1)
Sodium: 141 mmol/L (ref 135–145)

## 2021-07-08 MED ORDER — FUROSEMIDE 20 MG PO TABS: 20.0000 mg | ORAL_TABLET | ORAL | 3 refills | Status: DC | PRN

## 2021-07-08 MED ORDER — HYDROCHLOROTHIAZIDE 25 MG PO TABS: 25.0000 mg | ORAL_TABLET | Freq: Every day | ORAL | 3 refills | Status: DC

## 2021-07-08 NOTE — Progress Notes (Signed)
Advanced Heart Failure Clinic Note  PCP: [name], MD Cardiology: Dr. Aundra Dubin  HPI:  76 y.o. with history of CAD, diastolic CHF, and exertional dyspnea was self-referred to CHF clinic. She has struggled with exertional dyspnea for the last several months.  Echo in 06/2017 showed vigorous LV systolic function with a mild intracavitary gradient.  Given exertional dyspnea and chest pressure, she had LHC in 10/2019, showing 95% pRCA stenosis that was treated with DES.    Despite PCI, she did not feel much better.  She continued to have exertional dyspnea and chest pressure.  Her chest pressure was nonexertional and happened at random.  Echo was done in 11/2019, showing hyperdynamic LV with EF 65-70%, moderate LVH, normal RV.  Her metoprolol XL was increased to see if this would help her diastolic function and also started her on low dose Lasix 20 mg daily. Creatinine rose to 2.33 and Lasix was later stopped, creatinine dropped back to 1.08.  She now takes Lasix 20 mg three times a week.    Cardiolite in 07/2020 was a low risk study.    She was unable to tolerate Jardiance due to recurrent yeast infections.    She returned to clinic in 07/7251 for diastolic CHF and CAD follow up. She had a chronic pattern of atypical chest pain where she gets central chest heaviness radiating to her back that lasts for a few seconds, no trigger (not exertional) that has been ongoing for years.  She was chronically short of breath walking about 100 yards to the mailbox or stairs and her weight was up 10 lbs.  She was using CPAP. Her BP was elevated but usually runs < 664 systolic at home.   Today she returns to HF clinic for pharmacist medication titration. At last visit with MD patient was instructed to start exercise classes with the [facility] prep program and track her BP at home. Her home BP readings were elevated (SBP in the 180s) and spironolactone 25 mg daily was started on 06/17/2021. Overall she is feeling a lot  better since starting spironolactone. She tells me she has had trouble with high BP for about 1 year. She is checking her BP at home about once a week and the last reading she remembers was 158/98 mmHg. She is still trying to diet, but tells me this is not going as well as she had hoped in terms of weight loss. She does occasionally feel dizzy or lightheaded, mostly going from sitting to standing, however she has never checked her BP when this occurs. She notes her fatigue has been better since starting her CPAP and that her chest pain and pressure is unchanged. She does get SOB if she has to walk her stairs with anything heavy and stops half-way to catch her breath, but is able to complete her normal ADLs. She weighs herself at home and is taking her furosemide 20 mg TTS. No LEE, she does note she sometimes gets slight swelling in hands that she notices when she puts on her watch. She denies PND, but sleeps with the head of the bed elevated with her mattress. Her appetite is unchanged, but notes that she is trying to cut down on snacks and portion sizes while dieting, but has to avoid certain foods with her gastroparesis. She admits to not watching her salt-intake as closely as she should.   HF Medications: Metoprolol succinate 100 mg BID Entresto 97/103 mg BID Spironolactone 25 mg daily Imdur 60 mg daily  Lasix 20 mg 3 times a week  Has the patient been experiencing any side effects to the medications prescribed? NO  Does the patient have any problems obtaining medications due to transportation or finances?  NO; [employer]  Understanding of regimen: good Understanding of indications: good Potential of compliance: good Patient understands to avoid NSAIDs. Patient understands to avoid decongestants.    Pertinent Lab Values: Labs 05/03/2021: K 3.7, creatine 1.20 Labs 07/08/2021: BMET pending  Vital Signs: Weight: 190.2 lbs (last clinic weight: 190.8 lbs) Blood pressure: 160/92 mmHg Heart rate:  61 bpm  Assessment/Plan: 1. Chronic diastolic CHF: Echo in 07/9474 showed vigorous LV systolic function EF 54-65% with intracavity gradient.  Echo in 11/2019 showed EF 65-70% with moderate LVH, normal RV.   - Symptoms are NYHA class II-III, chronic. She does not appear volume overloaded on exam.  Deconditioning may play a significant role.  - Decrease Lasix 20 mg to PRN fluid/edema only - Continue metoprolol XL 100 mg BID. - Continue Entresto 97/103 mg BID. - Continue Spironolactone 25 mg daily - Continue Imdur 60 mg daily.  - Unable to tolerate Jardiance due to yeast infections.   2. Coronary artery disease: DES to RCA in 5/21. Cardiolite in 07/2020 was low risk (small partially reversible mid-anterior defect may be shifting breast artifact). Still with atypical chest pain, this has not changed.   - If chest pain or dyspnea change/worsen in future, next step would be RHC/LHC.  - Continue ASA 81.  - Continue Crestor 10 g daily, Repatha, and Zetia 10 mg daily.  LDL was very low in 12/2020.   3. CKD stage 3: Creatinine up to 2.33 with Lasix daily, last creatinine in 05/2021 was 1.2.    - BMET today pending  4. HTN: BP elevated today and at home. Renal artery dopplers in 07/2020 without renal artery stenosis.  - Start hydrochlorothiazide 25 mg daily. Repeat BMET in 2 weeks  - Consider increasing spironolactone at next visit    5. OSA: Continue CPAP.   Follow up 4 weeks at pharmacy clinic 6 months with Dr. Rush Farmer, PharmD, BCPS, [facility], [place] [facility] Pharmacist [phone]

## 2021-07-08 NOTE — Patient Instructions (Signed)
It was a pleasure seeing you today!  MEDICATIONS: -We are changing your medications today -Start taking hydrochlorothiazide 25 mg (1 tablet) daily -Stop taking your furosemide 20 mg three times a week, only take it when you need it for fluid retention (3lbs overnight or 5lbs in 1 week) -Call if you have questions about your medications.  LABS: -We will call you if your labs need attention.  NEXT APPOINTMENT: Return to clinic in 4 week with pharmacy clinic.  In general, to take care of your heart failure: -Limit your fluid intake to 2 Liters (half-gallon) per day.   -Limit your salt intake to ideally 2-3 grams (2000-3000 mg) per day. -Weigh yourself daily and record, and bring that "weight diary" to your next appointment.  (Weight gain of 2-3 pounds in 1 day typically means fluid weight.) -The medications for your heart are to help your heart and help you live longer.   -Please contact us before stopping any of your heart medications.  Call the clinic at [phone] with questions or to reschedule future appointments.

## 2021-07-10 ENCOUNTER — Telehealth (HOSPITAL_COMMUNITY): Payer: Self-pay | Admitting: Pharmacist

## 2021-07-10 DIAGNOSIS — I5032 Chronic diastolic (congestive) heart failure: Secondary | ICD-10-CM

## 2021-07-10 DIAGNOSIS — I5022 Chronic systolic (congestive) heart failure: Secondary | ICD-10-CM

## 2021-07-10 NOTE — Telephone Encounter (Signed)
BMET order placed for 07/22/21.   Audry Riles, PharmD, BCPS, BCCP, [facility] Pharmacist [phone]

## 2021-07-16 ENCOUNTER — Ambulatory Visit (INDEPENDENT_AMBULATORY_CARE_PROVIDER_SITE_OTHER): Payer: Medicare Other | Admitting: Cardiology

## 2021-07-16 ENCOUNTER — Encounter: Payer: Self-pay | Admitting: Cardiology

## 2021-07-16 ENCOUNTER — Other Ambulatory Visit: Payer: Self-pay

## 2021-07-16 VITALS — BP 130/64 | HR 67 | Ht 66.0 in | Wt 184.6 lb

## 2021-07-16 DIAGNOSIS — I10 Essential (primary) hypertension: Secondary | ICD-10-CM

## 2021-07-16 DIAGNOSIS — G4733 Obstructive sleep apnea (adult) (pediatric): Secondary | ICD-10-CM | POA: Insufficient documentation

## 2021-07-16 NOTE — Patient Instructions (Signed)
Medication Instructions:  Your physician recommends that you continue on your current medications as directed. Please refer to the Current Medication list given to you today. *If you need a refill on your cardiac medications before your next appointment, please call your pharmacy*  Lab Work: NONE ORDERED  Testing/Procedures: NONE ORDERED  Follow-Up: At [facility], you and your health needs are our priority.  As part of our continuing mission to provide you with exceptional heart care, we have created designated Provider Care Teams.  These Care Teams include your primary Cardiologist (physician) and Advanced Practice Providers (APPs -  Physician Assistants and Nurse Practitioners) who all work together to provide you with the care you need, when you need it.  We recommend signing up for the patient portal called "MyChart".  Sign up information is provided on this After Visit Summary.  MyChart is used to connect with patients for Virtual Visits (Telemedicine).  Patients are able to view lab/test results, encounter notes, upcoming appointments, etc.  Non-urgent messages can be sent to your provider as well.   To learn more about what you can do with MyChart, go to [email or web address].    Your next appointment:   1 year(s)  The format for your next appointment:   In Person  Provider:   Fransico Him, MD  Other Instructions

## 2021-07-16 NOTE — Progress Notes (Signed)
Cardiology Office Note:    Date:  07/16/2021   ID:  Angela, [name] 1945-08-10, MRN [id number]  PCP:  [name], MD  Cardiologist:  Ena Dawley, MD    Referring MD: [name], MD   Chief Complaint  Patient presents with   Sleep Apnea   Hypertension    History of Present Illness:    Angela [name] is a 76 y.o. female with a hx of anxiety, congestive heart failure, fibromyalgia, hypertension and obesity who was referred for Home sleep study by Dr. Aundra Dubin due to CHF.  She underwent WatchPAT sleep study showing severe obstructive sleep apnea with an AHI of 30.9/h but no significant central apnea.  She did have nocturnal hypoxemia with O2 sats less than 88% for more than 22 minutes with a nadir of 71%.  She underwent CPAP titration to 8 cm H2O and is now here for follow-up.  She is doing well with her CPAP device and thinks that she has gotten used to it.  She tolerates the nasal mask and feels the pressure is adequate.  Since going on CPAP she feels rested in the am and has no significant daytime sleepiness.  She is a mouth breather and has a lot of mouth dryness.  She just got a chin strap but has not started using it yet.  She denies any significant nasal dryness or nasal congestion.  She does not think that he snores.     Past Medical History:  Diagnosis Date   Anxiety    Cancer of sigmoid ([place])    CHF (congestive heart failure) (HCC)    Depression (emotion)    DJD (degenerative joint disease)    Fibromyalgia    GERD (gastroesophageal reflux disease)    Hemorrhoids    Hyperlipidemia    Hypertension    IBS (irritable bowel syndrome)    MRSA (methicillin resistant Staphylococcus aureus) septicemia (HCC)    history of   OSA on CPAP    Over weight    Pancreatitis    RLS (restless legs syndrome)    Tachycardia     Past Surgical History:  Procedure Laterality Date   CARDIAC CATHETERIZATION [name] 12/26/2015   Procedure: Left Heart Cath and Coronary  Angiography;  Surgeon: Troy Sine, MD;  Location: [facility];  Service: Cardiovascular;  Laterality: [name];   child birth     CORONARY STENT INTERVENTION [name] 10/28/2019   Procedure: CORONARY STENT INTERVENTION;  Surgeon: Burnell Blanks, MD;  Location: [place] [facility];  Service: Cardiovascular;  Laterality: [name];   LEFT HEART CATH AND CORONARY ANGIOGRAPHY [name] 10/28/2019   Procedure: LEFT HEART CATH AND CORONARY ANGIOGRAPHY;  Surgeon: Burnell Blanks, MD;  Location: [facility] [facility];  Service: Cardiovascular;  Laterality: [name];    Current Medications: Current Meds  Medication Sig   aspirin EC 81 MG tablet Take 81 mg by mouth daily.   DULoxetine (CYMBALTA) 20 MG capsule Take 20 mg by mouth 2 (two) times daily.    ENTRESTO 97-103 MG TAKE 1 TABLET TWICE A DAY   escitalopram (LEXAPRO) 10 MG tablet Take 10 mg by mouth daily.   esomeprazole (NEXIUM) 40 MG capsule Take 40 mg by mouth daily at 12 noon.   Evolocumab (REPATHA SURECLICK) 867 MG/ML SOAJ Inject 1 pen into the skin every 14 (fourteen) days.   ezetimibe (ZETIA) 10 MG tablet Take 10 mg by mouth daily.   fluticasone (FLONASE) 50 MCG/ACT nasal spray Place 2 sprays into  both nostrils daily as needed for allergies (seasonal allergies).    furosemide (LASIX) 20 MG tablet Take 1 tablet (20 mg total) by mouth as needed for fluid or edema.   hydrochlorothiazide (HYDRODIURIL) 25 MG tablet Take 1 tablet (25 mg total) by mouth daily.   HYDROcodone-acetaminophen (NORCO) 10-325 MG tablet Take 1 tablet by mouth every 4 (four) hours as needed.   isosorbide mononitrate (IMDUR) 60 MG 24 hr tablet TAKE 1 TABLET DAILY   LORazepam (ATIVAN) 2 MG tablet Take 2 mg by mouth at bedtime.   metoprolol succinate (TOPROL-XL) 100 MG 24 hr tablet TAKE 1 TABLET TWICE A DAY,TAKE WITH OR IMMEDIATELY FOLLOWING A MEAL (DISCONTINUE METOPROLOL TARTRATE)   nitroGLYCERIN (NITROSTAT) 0.4 MG SL tablet Place 1 tablet (0.4 mg total) under the tongue every 5  (five) minutes as needed for chest pain.   ondansetron (ZOFRAN) 4 MG tablet Take 4 mg by mouth every 8 (eight) hours as needed for nausea or vomiting.   rOPINIRole (REQUIP) 1 MG tablet Take 1-2 mg by mouth See admin instructions. Take 2 tablets (2 mg) by mouth daily at bedtime, may also take 1 tablet (1 mg) in the afternoon 15:00 for restless legs   rosuvastatin (CRESTOR) 10 MG tablet Take 1 tablet (10 mg total) by mouth at bedtime.   spironolactone (ALDACTONE) 25 MG tablet Take 1 tablet (25 mg total) by mouth daily.   tiZANidine (ZANAFLEX) 4 MG tablet Take 4 mg by mouth every 8 (eight) hours as needed for muscle spasms.      Allergies:   Phenergan [promethazine], Timentin [ticarcillin-pot clavulanate], Pregabalin, Norvasc [amlodipine], and Reglan [metoclopramide]   Social History   Socioeconomic History   Marital status: Married    Spouse name: Not on file   Number of children: Not on file   Years of education: Not on file   Highest education level: Not on file  Occupational History   Not on file  Tobacco Use   Smoking status: Never   Smokeless tobacco: Never  Substance and Sexual Activity   Alcohol use: No    Alcohol/week: 0.0 standard drinks   Drug use: No   Sexual activity: Not on file  Other Topics Concern   Not on file  Social History Narrative   Not on file   Social Determinants of Health   Financial Resource Strain: Not on file  Food Insecurity: Not on file  Transportation Needs: Not on file  Physical Activity: Not on file  Stress: Not on file  Social Connections: Not on file     Family History: The patient's family history includes Diabetes in her sister and another family member; Heart attack in her father; Heart disease in her mother and another family member; Hypertension in her mother and sister; Stroke in her father and another family member.  ROS:   Please see the history of present illness.    ROS  All other systems reviewed and negative.    EKGs/Labs/Other Studies Reviewed:    The following studies were reviewed today: Home sleep study, CPAP titration, PAP download  EKG:  EKG is not ordered today.    Recent Labs: 09/17/2020: ALT 14; Hemoglobin 13.2; Platelets 201 05/30/2021: B Natriuretic Peptide 144.3 07/08/2021: BUN 14; Creatinine, Ser 1.25; Potassium 4.2; Sodium 141   Recent Lipid Panel    Component Value Date/Time   CHOL 70 01/29/2021 1237   CHOL 158 02/01/2020 1049   TRIG 85 01/29/2021 1237   HDL 46 01/29/2021 1237   HDL 40  02/01/2020 1049   CHOLHDL 1.5 01/29/2021 1237   VLDL 17 01/29/2021 1237   LDLCALC 7 01/29/2021 1237   LDLCALC 93 02/01/2020 1049       Physical Exam:    VS:  BP 130/64    Pulse 67    Ht 5\' 6"  (1.676 m)    Wt 184 lb 9.6 oz (83.7 kg)    SpO2 95%    BMI 29.80 kg/m     Wt Readings from Last 3 Encounters:  07/16/21 184 lb 9.6 oz (83.7 kg)  07/08/21 190 lb 3.2 oz (86.3 kg)  05/30/21 190 lb 12.8 oz (86.5 kg)     GEN:  Well nourished, well developed in no acute distress HEENT: Normal NECK: No JVD; No carotid bruits LYMPHATICS: No lymphadenopathy CARDIAC: RRR, no murmurs, rubs, gallops RESPIRATORY:  Clear to auscultation without rales, wheezing or rhonchi  ABDOMEN: Soft, non-tender, non-distended MUSCULOSKELETAL:  No edema; No deformity  SKIN: Warm and dry NEUROLOGIC:  Alert and oriented x 3 PSYCHIATRIC:  Normal affect   ASSESSMENT:    1. OSA (obstructive sleep apnea)   2. Essential hypertension    PLAN:    In order of problems listed above:   OSA - The patient is tolerating PAP therapy well without any problems. The patient has been using and benefiting from PAP use and will continue to benefit from therapy.  -I will get a download from her DME  2.  Hypertension -BP is controlled on exam today -Continue prescription drug management with Entresto 97-103 mg twice daily, HCTZ 25 mg daily, Imdur 60 mg daily, Toprol-XL 100 mg twice daily with as needed refills   Time  Spent: 20 minutes total time of encounter, including 15 minutes spent in face-to-face patient care on the date of this encounter. This time includes coordination of care and counseling regarding above mentioned problem list. Remainder of non-face-to-face time involved reviewing chart documents/testing relevant to the patient encounter and documentation in the medical record. I have independently reviewed documentation from referring provider  Medication Adjustments/Labs and Tests Ordered: Current medicines are reviewed at length with the patient today.  Concerns regarding medicines are outlined above.  No orders of the defined types were placed in this encounter.  No orders of the defined types were placed in this encounter.   Signed, Fransico Him, MD  07/16/2021 2:13 PM    [facility]

## 2021-07-22 ENCOUNTER — Encounter (HOSPITAL_COMMUNITY): Payer: Self-pay | Admitting: Cardiology

## 2021-07-22 ENCOUNTER — Other Ambulatory Visit (HOSPITAL_COMMUNITY): Payer: TRICARE For Life (TFL)

## 2021-07-23 ENCOUNTER — Ambulatory Visit (HOSPITAL_COMMUNITY)
Admission: RE | Admit: 2021-07-23 | Discharge: 2021-07-23 | Disposition: A | Discharge status: Home / Self Care | Payer: Medicare Other | Source: Ambulatory Visit | Attending: Internal Medicine | Admitting: Internal Medicine

## 2021-07-23 ENCOUNTER — Other Ambulatory Visit: Payer: Self-pay

## 2021-07-23 DIAGNOSIS — I5022 Chronic systolic (congestive) heart failure: Secondary | ICD-10-CM | POA: Insufficient documentation

## 2021-07-23 LAB — BASIC METABOLIC PANEL
Anion gap: 8 (ref 5–15)
BUN: 17 mg/dL (ref 8–23)
CO2: 32 mmol/L (ref 22–32)
Calcium: 9.5 mg/dL (ref 8.9–10.3)
Chloride: 103 mmol/L (ref 98–111)
Creatinine, Ser: 1.35 mg/dL — ABNORMAL HIGH (ref 0.44–1.00)
GFR, Estimated: 41 mL/min — ABNORMAL LOW (ref >60)
Glucose, Bld: 147 mg/dL — ABNORMAL HIGH (ref 70–99)
Potassium: 4.9 mmol/L (ref 3.5–5.1)
Sodium: 143 mmol/L (ref 135–145)

## 2021-07-23 NOTE — Progress Notes (Signed)
?Advanced Heart Failure Clinic Note  ?PCP: [name], MD ?Cardiology: Dr. Aundra Dubin ? ?HPI:  ?76 y.o. with history of CAD, diastolic CHF, and exertional dyspnea was self-referred to CHF clinic. She has struggled with exertional dyspnea for the last several months.  Echo in 06/2017 showed vigorous LV systolic function with a mild intracavitary gradient.  Given exertional dyspnea and chest pressure, she had LHC in 10/2019, showing 95% pRCA stenosis that was treated with DES.  ?  ?Despite PCI, she did not feel much better.  She continued to have exertional dyspnea and chest pressure.  Her chest pressure was nonexertional and happened at random.  Echo was done in 11/2019, showing hyperdynamic LV with EF 65-70%, moderate LVH, normal RV.  Her metoprolol XL was increased to see if this would help her diastolic function and also started her on low dose Lasix 20 mg daily. Creatinine rose to 2.33 and Lasix was later stopped, creatinine dropped back to 1.08.  She now takes Lasix 20 mg three times a week.  ?  ?Cardiolite in 07/2020 was a low risk study.  ?  ?She was unable to tolerate Jardiance due to recurrent yeast infections.  ?  ?She returned to clinic in 85/0277 for diastolic CHF and CAD follow up. She had a chronic pattern of atypical chest pain where she gets central chest heaviness radiating to her back that lasts for a few seconds, no trigger (not exertional) that has been ongoing for years.  She was chronically short of breath walking about 100 yards to the mailbox or stairs and her weight was up 10 lbs.  She was using CPAP. Her BP was elevated but usually runs < 412 systolic at home.  ? ?Presented to HF clinic for pharmacist medication titration on 07/08/21. Overall she reported feeling a lot better since starting spironolactone on 06/17/21.  She had been checking her BP at home about once a week and the last reading she remembered was 158/98 mmHg. She was still trying to diet, but reported that it was not going as well  as she had hoped in terms of weight loss. She reported occasionally feeling dizzy or lightheaded, mostly going from sitting to standing, however she had never checked her BP when this occurs. She noted her fatigue had been better since starting her CPAP and that her chest pain and pressure was unchanged. She reported get SOB if she has to walk her stairs with anything heavy and stops half-way to catch her breath. She reported that she weighs herself at home and is taking her furosemide 20 mg TTS. No LEE, PND or orthopnea.  ? ?Today she returns to HF clinic for pharmacist medication titration. At recent visits to AHF clinic, spironolactone 25 mg daily and hydrochlorothiazide 25 mg daily were started. Additionally, her Lasix was changed from three times per week to just as needed. Overall she is feeling well today. No dizziness or lightheadedness. Atypical CP is stable. BP in clinic has improved to 132/72 and was 130/78 at another office visit this morning. SBPs on home machine have generally been running in the 130s. She has not needed any PRN Lasix since last visit. No LEE on exam today. Notes that her legs will sometimes swell if she is very active throughout the day, but resolves overnight. No PND or orthopnea.  ? ? ?HF Medications: ?Metoprolol succinate 100 mg BID ?Entresto 97/103 mg BID ?Spironolactone 25 mg daily ?Imdur 60 mg daily ?Lasix 20 mg PRN ? ?Has the patient been  experiencing any side effects to the medications prescribed? NO ? ?Does the patient have any problems obtaining medications due to transportation or finances?  NO; [employer] ? ?Understanding of regimen: good ?Understanding of indications: good ?Potential of compliance: good ?Patient understands to avoid NSAIDs. ?Patient understands to avoid decongestants. ?  ? ?Pertinent Lab Values: ?07/23/21: Serum creatinine 1.35, BUN 17, Potassium 4.9, Sodium 143 ? ?Vital Signs:  ?Weight: 182.4 lbs (last clinic weight: 190.2 lbs) ?Blood pressure: 132/72  mmHg ?Heart rate: 57 bpm ? ?Assessment/Plan: ?1. Chronic diastolic CHF: Echo in 07/7480 showed vigorous LV systolic function EF 70-78% with intracavity gradient.  Echo in 11/2019 showed EF 65-70% with moderate LVH, normal RV.   ?- Symptoms are NYHA class II-III, chronic. She does not appear volume overloaded on exam.  Deconditioning may play a significant role.  ?- Continue Lasix 20 mg PRN fluid/edema  ?- Continue metoprolol XL 100 mg BID. ?- Continue Entresto 97/103 mg BID. ?- Continue Spironolactone 25 mg daily ?- Continue Imdur 60 mg daily.  ?- Unable to tolerate Jardiance due to yeast infections.  ? ?2. Coronary artery disease: DES to RCA in 5/21. Cardiolite in 07/2020 was low risk (small partially reversible mid-anterior defect may be shifting breast artifact). Still with atypical chest pain, this has not changed.   ?- If chest pain or dyspnea change/worsen in future, next step would be RHC/LHC.  ?- Continue ASA 81.  ?- Continue Crestor 10 g daily, Repatha, and Zetia 10 mg daily.  LDL was very low in 12/2020.  ? ?3. CKD stage 3: Creatinine up to 2.33 with Lasix daily, last creatinine 07/23/21 was 1.35.    ? ? ?4. HTN:  Renal artery dopplers in 07/2020 without renal artery stenosis.  ?-BP improved and now controlled ?- Continue current regimen.  ?  ?5. OSA: Continue CPAP.  ? ?Follow up in 3 months with Dr. Aundra Dubin ? ?Audry Riles, PharmD, BCPS, BCCP, CPP ?Heart Failure Clinic Pharmacist ?[phone] ?  ?

## 2021-07-29 DIAGNOSIS — Z20822 Contact with and (suspected) exposure to covid-19: Secondary | ICD-10-CM | POA: Diagnosis not present

## 2021-08-05 ENCOUNTER — Ambulatory Visit (HOSPITAL_COMMUNITY)
Admission: RE | Admit: 2021-08-05 | Discharge: 2021-08-05 | Disposition: A | Discharge status: Home / Self Care | Payer: Medicare Other | Source: Ambulatory Visit | Attending: Cardiology | Admitting: Cardiology

## 2021-08-05 ENCOUNTER — Other Ambulatory Visit: Payer: Self-pay

## 2021-08-05 ENCOUNTER — Ambulatory Visit (HOSPITAL_BASED_OUTPATIENT_CLINIC_OR_DEPARTMENT_OTHER)
Admission: RE | Admit: 2021-08-05 | Discharge: 2021-08-05 | Disposition: A | Discharge status: Home / Self Care | Payer: Medicare Other | Source: Ambulatory Visit | Attending: Cardiology | Admitting: Cardiology

## 2021-08-05 VITALS — BP 132/72 | HR 57 | Wt 182.4 lb

## 2021-08-05 DIAGNOSIS — I5032 Chronic diastolic (congestive) heart failure: Secondary | ICD-10-CM | POA: Diagnosis not present

## 2021-08-05 DIAGNOSIS — Z7982 Long term (current) use of aspirin: Secondary | ICD-10-CM | POA: Diagnosis not present

## 2021-08-05 DIAGNOSIS — Z79899 Other long term (current) drug therapy: Secondary | ICD-10-CM | POA: Diagnosis not present

## 2021-08-05 DIAGNOSIS — I2510 Atherosclerotic heart disease of native coronary artery without angina pectoris: Secondary | ICD-10-CM | POA: Insufficient documentation

## 2021-08-05 DIAGNOSIS — M546 Pain in thoracic spine: Secondary | ICD-10-CM | POA: Diagnosis not present

## 2021-08-05 DIAGNOSIS — I700 Atherosclerosis of aorta: Secondary | ICD-10-CM | POA: Diagnosis not present

## 2021-08-05 DIAGNOSIS — N183 Chronic kidney disease, stage 3 unspecified: Secondary | ICD-10-CM | POA: Diagnosis not present

## 2021-08-05 DIAGNOSIS — E785 Hyperlipidemia, unspecified: Secondary | ICD-10-CM | POA: Diagnosis not present

## 2021-08-05 DIAGNOSIS — I083 Combined rheumatic disorders of mitral, aortic and tricuspid valves: Secondary | ICD-10-CM | POA: Diagnosis not present

## 2021-08-05 DIAGNOSIS — M961 Postlaminectomy syndrome, not elsewhere classified: Secondary | ICD-10-CM | POA: Diagnosis not present

## 2021-08-05 DIAGNOSIS — M5412 Radiculopathy, cervical region: Secondary | ICD-10-CM | POA: Diagnosis not present

## 2021-08-05 DIAGNOSIS — I130 Hypertensive heart and chronic kidney disease with heart failure and stage 1 through stage 4 chronic kidney disease, or unspecified chronic kidney disease: Secondary | ICD-10-CM | POA: Diagnosis not present

## 2021-08-05 DIAGNOSIS — Z955 Presence of coronary angioplasty implant and graft: Secondary | ICD-10-CM | POA: Insufficient documentation

## 2021-08-05 DIAGNOSIS — R0609 Other forms of dyspnea: Secondary | ICD-10-CM | POA: Insufficient documentation

## 2021-08-05 DIAGNOSIS — I77819 Aortic ectasia, unspecified site: Secondary | ICD-10-CM | POA: Diagnosis not present

## 2021-08-05 DIAGNOSIS — I5022 Chronic systolic (congestive) heart failure: Secondary | ICD-10-CM | POA: Diagnosis not present

## 2021-08-05 DIAGNOSIS — R0789 Other chest pain: Secondary | ICD-10-CM | POA: Diagnosis not present

## 2021-08-05 DIAGNOSIS — R0602 Shortness of breath: Secondary | ICD-10-CM | POA: Insufficient documentation

## 2021-08-05 DIAGNOSIS — Z9989 Dependence on other enabling machines and devices: Secondary | ICD-10-CM | POA: Insufficient documentation

## 2021-08-05 DIAGNOSIS — G4733 Obstructive sleep apnea (adult) (pediatric): Secondary | ICD-10-CM | POA: Insufficient documentation

## 2021-08-05 DIAGNOSIS — M5030 Other cervical disc degeneration, unspecified cervical region: Secondary | ICD-10-CM | POA: Diagnosis not present

## 2021-08-05 LAB — ECHOCARDIOGRAM COMPLETE
AR max vel: 2.26 cm2
AV Area VTI: 1.87 cm2
AV Area mean vel: 2.03 cm2
AV Mean grad: 6.4 mmHg
AV Peak grad: 11.5 mmHg
Ao pk vel: 1.7 m/s
Area-P 1/2: 2.56 cm2
Calc EF: 75.4 %
S' Lateral: 2.1 cm
Single Plane A2C EF: 76.3 %
Single Plane A4C EF: 73.1 %

## 2021-08-05 NOTE — Progress Notes (Signed)
?  Echocardiogram ?2D Echocardiogram has been performed. ? ?Angela [name] ?08/05/2021, 11:53 AM ?

## 2021-08-05 NOTE — Patient Instructions (Signed)
It was a pleasure seeing you today! ? ?MEDICATIONS: ?-No medication changes today ?-Call if you have questions about your medications. ? ?NEXT APPOINTMENT: ?Return to clinic in 3 months with Dr. Aundra Dubin. Please call the clinic at [phone] in May to schedule appointment.  ? ?In general, to take care of your heart failure: ?-Limit your fluid intake to 2 Liters (half-gallon) per day.   ?-Limit your salt intake to ideally 2-3 grams (2000-3000 mg) per day. ?-Weigh yourself daily and record, and bring that "weight diary" to your next appointment.  (Weight gain of 2-3 pounds in 1 day typically means fluid weight.) ?-The medications for your heart are to help your heart and help you live longer.   ?-Please contact us before stopping any of your heart medications. ? ?Call the clinic at [phone] with questions or to reschedule future appointments.  ?

## 2021-08-09 DIAGNOSIS — M1712 Unilateral primary osteoarthritis, left knee: Secondary | ICD-10-CM | POA: Diagnosis not present

## 2021-08-13 DIAGNOSIS — Z20822 Contact with and (suspected) exposure to covid-19: Secondary | ICD-10-CM | POA: Diagnosis not present

## 2021-08-22 ENCOUNTER — Ambulatory Visit (INDEPENDENT_AMBULATORY_CARE_PROVIDER_SITE_OTHER): Payer: Medicare Other | Admitting: Physician Assistant

## 2021-08-22 ENCOUNTER — Encounter: Payer: Self-pay | Admitting: Physician Assistant

## 2021-08-22 ENCOUNTER — Other Ambulatory Visit: Payer: Self-pay

## 2021-08-22 DIAGNOSIS — L820 Inflamed seborrheic keratosis: Secondary | ICD-10-CM

## 2021-08-30 ENCOUNTER — Other Ambulatory Visit (HOSPITAL_COMMUNITY): Payer: Self-pay | Admitting: Cardiology

## 2021-09-02 ENCOUNTER — Other Ambulatory Visit: Payer: Self-pay | Admitting: *Deleted

## 2021-09-02 MED ORDER — ROSUVASTATIN CALCIUM 10 MG PO TABS: 10.0000 mg | ORAL_TABLET | Freq: Every day | ORAL | 3 refills | Status: DC

## 2021-09-03 DIAGNOSIS — M542 Cervicalgia: Secondary | ICD-10-CM | POA: Diagnosis not present

## 2021-09-03 DIAGNOSIS — M1710 Unilateral primary osteoarthritis, unspecified knee: Secondary | ICD-10-CM | POA: Diagnosis not present

## 2021-09-03 DIAGNOSIS — G894 Chronic pain syndrome: Secondary | ICD-10-CM | POA: Diagnosis not present

## 2021-09-03 DIAGNOSIS — Z79899 Other long term (current) drug therapy: Secondary | ICD-10-CM | POA: Diagnosis not present

## 2021-09-03 DIAGNOSIS — M5030 Other cervical disc degeneration, unspecified cervical region: Secondary | ICD-10-CM | POA: Diagnosis not present

## 2021-09-03 DIAGNOSIS — M5459 Other low back pain: Secondary | ICD-10-CM | POA: Diagnosis not present

## 2021-09-03 DIAGNOSIS — Z79891 Long term (current) use of opiate analgesic: Secondary | ICD-10-CM | POA: Diagnosis not present

## 2021-09-03 DIAGNOSIS — M961 Postlaminectomy syndrome, not elsewhere classified: Secondary | ICD-10-CM | POA: Diagnosis not present

## 2021-09-03 DIAGNOSIS — M5412 Radiculopathy, cervical region: Secondary | ICD-10-CM | POA: Diagnosis not present

## 2021-09-03 DIAGNOSIS — M797 Fibromyalgia: Secondary | ICD-10-CM | POA: Diagnosis not present

## 2021-09-05 DIAGNOSIS — Z20822 Contact with and (suspected) exposure to covid-19: Secondary | ICD-10-CM | POA: Diagnosis not present

## 2021-09-06 DIAGNOSIS — Z20822 Contact with and (suspected) exposure to covid-19: Secondary | ICD-10-CM | POA: Diagnosis not present

## 2021-09-12 ENCOUNTER — Encounter: Payer: Self-pay | Admitting: Physician Assistant

## 2021-09-12 NOTE — Progress Notes (Signed)
? ?  New Patient ?  ?Subjective  ?Angela [name] is a 76 y.o. female who presents for the following: Skin Problem (New lesion on mid forehead x 1 year- gets bigger & itches, right armpit- ? Skin tag.). ? ? ?The following portions of the chart were reviewed this encounter and updated as appropriate:  Tobacco  Allergies  Meds  Problems  Med Hx  Surg Hx  Fam Hx   ?  ? ?Objective  ?Well appearing patient in no apparent distress; mood and affect are within normal limits. ? ?A full examination was performed including scalp, head, eyes, ears, nose, lips, neck, chest, axillae, abdomen, back, buttocks, bilateral upper extremities, bilateral lower extremities, hands, feet, fingers, toes, fingernails, and toenails. All findings within normal limits unless otherwise noted below. ? ?Right Axilla ?Crusted plaque on an erythematous base.  ? ? ?Assessment & Plan  ?Inflamed seborrheic keratosis ?Right Axilla ? ?Destruction of lesion - Right Axilla ?Complexity: simple   ?Destruction method: cryotherapy   ?Informed consent: discussed and consent obtained   ?Timeout:  patient name, date of birth, surgical site, and procedure verified ?Lesion destroyed using liquid nitrogen: Yes   ?Cryotherapy cycles:  1 ?Outcome: patient tolerated procedure well with no complications   ?Post-procedure details: wound care instructions given   ? ? ? ?No atypical nevi noted at the time of the visit. ? ?I, Sabrinia Prien, PA-C, have reviewed all documentation's for this visit.  The documentation on 09/12/21 for the exam, diagnosis, procedures and orders are all accurate and complete. ?

## 2021-09-16 DIAGNOSIS — Z20822 Contact with and (suspected) exposure to covid-19: Secondary | ICD-10-CM | POA: Diagnosis not present

## 2021-09-18 ENCOUNTER — Other Ambulatory Visit (HOSPITAL_COMMUNITY): Payer: Self-pay

## 2021-09-18 MED ORDER — FUROSEMIDE 20 MG PO TABS: 20.0000 mg | ORAL_TABLET | ORAL | 0 refills | Status: DC | PRN

## 2021-09-24 DIAGNOSIS — M25519 Pain in unspecified shoulder: Secondary | ICD-10-CM | POA: Diagnosis not present

## 2021-09-24 DIAGNOSIS — M961 Postlaminectomy syndrome, not elsewhere classified: Secondary | ICD-10-CM | POA: Diagnosis not present

## 2021-09-24 DIAGNOSIS — M546 Pain in thoracic spine: Secondary | ICD-10-CM | POA: Diagnosis not present

## 2021-09-24 DIAGNOSIS — Z79891 Long term (current) use of opiate analgesic: Secondary | ICD-10-CM | POA: Diagnosis not present

## 2021-09-24 DIAGNOSIS — G894 Chronic pain syndrome: Secondary | ICD-10-CM | POA: Diagnosis not present

## 2021-09-24 DIAGNOSIS — M5412 Radiculopathy, cervical region: Secondary | ICD-10-CM | POA: Diagnosis not present

## 2021-09-24 DIAGNOSIS — M5459 Other low back pain: Secondary | ICD-10-CM | POA: Diagnosis not present

## 2021-09-24 DIAGNOSIS — M542 Cervicalgia: Secondary | ICD-10-CM | POA: Diagnosis not present

## 2021-09-24 DIAGNOSIS — M797 Fibromyalgia: Secondary | ICD-10-CM | POA: Diagnosis not present

## 2021-09-24 DIAGNOSIS — M5030 Other cervical disc degeneration, unspecified cervical region: Secondary | ICD-10-CM | POA: Diagnosis not present

## 2021-10-03 DIAGNOSIS — Z79899 Other long term (current) drug therapy: Secondary | ICD-10-CM | POA: Diagnosis not present

## 2021-10-03 DIAGNOSIS — M797 Fibromyalgia: Secondary | ICD-10-CM | POA: Diagnosis not present

## 2021-10-03 DIAGNOSIS — M25519 Pain in unspecified shoulder: Secondary | ICD-10-CM | POA: Diagnosis not present

## 2021-10-03 DIAGNOSIS — M542 Cervicalgia: Secondary | ICD-10-CM | POA: Diagnosis not present

## 2021-10-03 DIAGNOSIS — M961 Postlaminectomy syndrome, not elsewhere classified: Secondary | ICD-10-CM | POA: Diagnosis not present

## 2021-10-03 DIAGNOSIS — M5459 Other low back pain: Secondary | ICD-10-CM | POA: Diagnosis not present

## 2021-10-03 DIAGNOSIS — M5030 Other cervical disc degeneration, unspecified cervical region: Secondary | ICD-10-CM | POA: Diagnosis not present

## 2021-10-03 DIAGNOSIS — Z79891 Long term (current) use of opiate analgesic: Secondary | ICD-10-CM | POA: Diagnosis not present

## 2021-10-03 DIAGNOSIS — M546 Pain in thoracic spine: Secondary | ICD-10-CM | POA: Diagnosis not present

## 2021-10-03 DIAGNOSIS — G894 Chronic pain syndrome: Secondary | ICD-10-CM | POA: Diagnosis not present

## 2021-10-03 DIAGNOSIS — M1710 Unilateral primary osteoarthritis, unspecified knee: Secondary | ICD-10-CM | POA: Diagnosis not present

## 2021-10-03 DIAGNOSIS — M5412 Radiculopathy, cervical region: Secondary | ICD-10-CM | POA: Diagnosis not present

## 2021-10-07 ENCOUNTER — Telehealth: Payer: Self-pay | Admitting: *Deleted

## 2021-10-07 DIAGNOSIS — Z20822 Contact with and (suspected) exposure to covid-19: Secondary | ICD-10-CM | POA: Diagnosis not present

## 2021-10-07 DIAGNOSIS — G4733 Obstructive sleep apnea (adult) (pediatric): Secondary | ICD-10-CM

## 2021-10-07 NOTE — Telephone Encounter (Signed)
Per dr Radford Pax, ?AHI is too high.  Increase CPAP to 10cm H2O and get a download in 4 weeks.  Please encourage increased compliance ? ? ?To dr Radford Pax, ?Patient has improved her compliance to 90% and her AHI is 4.3 ?

## 2021-10-14 NOTE — Addendum Note (Signed)
Addended by: Freada Bergeron on: 10/14/2021 03:25 PM ? ? Modules accepted: Orders ? ?

## 2021-10-17 ENCOUNTER — Other Ambulatory Visit: Payer: Self-pay | Admitting: Cardiology

## 2021-11-01 DIAGNOSIS — M542 Cervicalgia: Secondary | ICD-10-CM | POA: Diagnosis not present

## 2021-11-01 DIAGNOSIS — M25519 Pain in unspecified shoulder: Secondary | ICD-10-CM | POA: Diagnosis not present

## 2021-11-01 DIAGNOSIS — Z79891 Long term (current) use of opiate analgesic: Secondary | ICD-10-CM | POA: Diagnosis not present

## 2021-11-01 DIAGNOSIS — M797 Fibromyalgia: Secondary | ICD-10-CM | POA: Diagnosis not present

## 2021-11-01 DIAGNOSIS — G894 Chronic pain syndrome: Secondary | ICD-10-CM | POA: Diagnosis not present

## 2021-11-01 DIAGNOSIS — M5030 Other cervical disc degeneration, unspecified cervical region: Secondary | ICD-10-CM | POA: Diagnosis not present

## 2021-11-01 DIAGNOSIS — M5412 Radiculopathy, cervical region: Secondary | ICD-10-CM | POA: Diagnosis not present

## 2021-11-01 DIAGNOSIS — M961 Postlaminectomy syndrome, not elsewhere classified: Secondary | ICD-10-CM | POA: Diagnosis not present

## 2021-11-01 DIAGNOSIS — M546 Pain in thoracic spine: Secondary | ICD-10-CM | POA: Diagnosis not present

## 2021-11-01 DIAGNOSIS — M1710 Unilateral primary osteoarthritis, unspecified knee: Secondary | ICD-10-CM | POA: Diagnosis not present

## 2021-11-01 DIAGNOSIS — Z79899 Other long term (current) drug therapy: Secondary | ICD-10-CM | POA: Diagnosis not present

## 2021-11-01 DIAGNOSIS — M5459 Other low back pain: Secondary | ICD-10-CM | POA: Diagnosis not present

## 2021-11-22 DIAGNOSIS — I10 Essential (primary) hypertension: Secondary | ICD-10-CM | POA: Diagnosis not present

## 2021-11-22 DIAGNOSIS — F329 Major depressive disorder, single episode, unspecified: Secondary | ICD-10-CM | POA: Diagnosis not present

## 2021-11-22 DIAGNOSIS — Z0000 Encounter for general adult medical examination without abnormal findings: Secondary | ICD-10-CM | POA: Diagnosis not present

## 2021-11-22 DIAGNOSIS — E785 Hyperlipidemia, unspecified: Secondary | ICD-10-CM | POA: Diagnosis not present

## 2021-11-22 DIAGNOSIS — R110 Nausea: Secondary | ICD-10-CM | POA: Diagnosis not present

## 2021-11-22 DIAGNOSIS — K219 Gastro-esophageal reflux disease without esophagitis: Secondary | ICD-10-CM | POA: Diagnosis not present

## 2021-11-22 DIAGNOSIS — I2510 Atherosclerotic heart disease of native coronary artery without angina pectoris: Secondary | ICD-10-CM | POA: Diagnosis not present

## 2021-11-22 DIAGNOSIS — R7303 Prediabetes: Secondary | ICD-10-CM | POA: Diagnosis not present

## 2021-12-06 ENCOUNTER — Telehealth (HOSPITAL_COMMUNITY): Payer: Self-pay | Admitting: *Deleted

## 2021-12-06 DIAGNOSIS — M5412 Radiculopathy, cervical region: Secondary | ICD-10-CM | POA: Diagnosis not present

## 2021-12-06 DIAGNOSIS — G894 Chronic pain syndrome: Secondary | ICD-10-CM | POA: Diagnosis not present

## 2021-12-06 DIAGNOSIS — M797 Fibromyalgia: Secondary | ICD-10-CM | POA: Diagnosis not present

## 2021-12-06 DIAGNOSIS — M546 Pain in thoracic spine: Secondary | ICD-10-CM | POA: Diagnosis not present

## 2021-12-06 DIAGNOSIS — M4322 Fusion of spine, cervical region: Secondary | ICD-10-CM | POA: Diagnosis not present

## 2021-12-06 DIAGNOSIS — M5030 Other cervical disc degeneration, unspecified cervical region: Secondary | ICD-10-CM | POA: Diagnosis not present

## 2021-12-06 DIAGNOSIS — M961 Postlaminectomy syndrome, not elsewhere classified: Secondary | ICD-10-CM | POA: Diagnosis not present

## 2021-12-06 DIAGNOSIS — Z79899 Other long term (current) drug therapy: Secondary | ICD-10-CM | POA: Diagnosis not present

## 2021-12-06 DIAGNOSIS — M25519 Pain in unspecified shoulder: Secondary | ICD-10-CM | POA: Diagnosis not present

## 2021-12-06 DIAGNOSIS — M542 Cervicalgia: Secondary | ICD-10-CM | POA: Diagnosis not present

## 2021-12-06 DIAGNOSIS — M1710 Unilateral primary osteoarthritis, unspecified knee: Secondary | ICD-10-CM | POA: Diagnosis not present

## 2021-12-06 DIAGNOSIS — M5459 Other low back pain: Secondary | ICD-10-CM | POA: Diagnosis not present

## 2021-12-06 NOTE — Telephone Encounter (Signed)
Pt left vm stating she's had chest pain, tachycardia, and profuse sweating that lasts about 41mn. Pt advised to go to the emergency room. Pt said her symptoms were relieved with nitroglycerin but she will go to the emergency room if it happens again.

## 2022-01-09 DIAGNOSIS — Z79891 Long term (current) use of opiate analgesic: Secondary | ICD-10-CM | POA: Diagnosis not present

## 2022-01-09 DIAGNOSIS — M25561 Pain in right knee: Secondary | ICD-10-CM | POA: Diagnosis not present

## 2022-01-09 DIAGNOSIS — M5030 Other cervical disc degeneration, unspecified cervical region: Secondary | ICD-10-CM | POA: Diagnosis not present

## 2022-01-09 DIAGNOSIS — M25562 Pain in left knee: Secondary | ICD-10-CM | POA: Diagnosis not present

## 2022-01-09 DIAGNOSIS — M5412 Radiculopathy, cervical region: Secondary | ICD-10-CM | POA: Diagnosis not present

## 2022-01-09 DIAGNOSIS — M546 Pain in thoracic spine: Secondary | ICD-10-CM | POA: Diagnosis not present

## 2022-01-09 DIAGNOSIS — M961 Postlaminectomy syndrome, not elsewhere classified: Secondary | ICD-10-CM | POA: Diagnosis not present

## 2022-01-09 DIAGNOSIS — M5459 Other low back pain: Secondary | ICD-10-CM | POA: Diagnosis not present

## 2022-01-09 DIAGNOSIS — G894 Chronic pain syndrome: Secondary | ICD-10-CM | POA: Diagnosis not present

## 2022-01-09 DIAGNOSIS — M4322 Fusion of spine, cervical region: Secondary | ICD-10-CM | POA: Diagnosis not present

## 2022-01-09 DIAGNOSIS — M25519 Pain in unspecified shoulder: Secondary | ICD-10-CM | POA: Diagnosis not present

## 2022-01-09 DIAGNOSIS — M797 Fibromyalgia: Secondary | ICD-10-CM | POA: Diagnosis not present

## 2022-01-31 ENCOUNTER — Other Ambulatory Visit (HOSPITAL_COMMUNITY): Payer: Self-pay

## 2022-01-31 ENCOUNTER — Encounter (HOSPITAL_COMMUNITY): Payer: Self-pay | Admitting: Cardiology

## 2022-01-31 ENCOUNTER — Other Ambulatory Visit (HOSPITAL_COMMUNITY): Payer: Self-pay | Admitting: Cardiology

## 2022-01-31 ENCOUNTER — Ambulatory Visit (HOSPITAL_COMMUNITY)
Admission: RE | Admit: 2022-01-31 | Discharge: 2022-01-31 | Disposition: A | Discharge status: Home / Self Care | Payer: Medicare Other | Source: Ambulatory Visit | Attending: Cardiology | Admitting: Cardiology

## 2022-01-31 VITALS — BP 102/60 | Wt 178.4 lb

## 2022-01-31 DIAGNOSIS — R0789 Other chest pain: Secondary | ICD-10-CM | POA: Insufficient documentation

## 2022-01-31 DIAGNOSIS — I130 Hypertensive heart and chronic kidney disease with heart failure and stage 1 through stage 4 chronic kidney disease, or unspecified chronic kidney disease: Secondary | ICD-10-CM | POA: Diagnosis not present

## 2022-01-31 DIAGNOSIS — N183 Chronic kidney disease, stage 3 unspecified: Secondary | ICD-10-CM | POA: Diagnosis not present

## 2022-01-31 DIAGNOSIS — I5022 Chronic systolic (congestive) heart failure: Secondary | ICD-10-CM

## 2022-01-31 DIAGNOSIS — Z79899 Other long term (current) drug therapy: Secondary | ICD-10-CM | POA: Diagnosis not present

## 2022-01-31 DIAGNOSIS — I2510 Atherosclerotic heart disease of native coronary artery without angina pectoris: Secondary | ICD-10-CM | POA: Insufficient documentation

## 2022-01-31 DIAGNOSIS — R000 Tachycardia, unspecified: Secondary | ICD-10-CM | POA: Insufficient documentation

## 2022-01-31 DIAGNOSIS — E782 Mixed hyperlipidemia: Secondary | ICD-10-CM

## 2022-01-31 DIAGNOSIS — R002 Palpitations: Secondary | ICD-10-CM

## 2022-01-31 DIAGNOSIS — E785 Hyperlipidemia, unspecified: Secondary | ICD-10-CM | POA: Insufficient documentation

## 2022-01-31 DIAGNOSIS — Z955 Presence of coronary angioplasty implant and graft: Secondary | ICD-10-CM | POA: Insufficient documentation

## 2022-01-31 DIAGNOSIS — I208 Other forms of angina pectoris: Secondary | ICD-10-CM

## 2022-01-31 DIAGNOSIS — I5032 Chronic diastolic (congestive) heart failure: Secondary | ICD-10-CM | POA: Insufficient documentation

## 2022-01-31 DIAGNOSIS — G4733 Obstructive sleep apnea (adult) (pediatric): Secondary | ICD-10-CM | POA: Insufficient documentation

## 2022-01-31 LAB — BRAIN NATRIURETIC PEPTIDE: B Natriuretic Peptide: 139.5 pg/mL — ABNORMAL HIGH (ref 0.0–100.0)

## 2022-01-31 LAB — LIPID PANEL
Cholesterol: 63 mg/dL (ref 0–200)
HDL: 47 mg/dL (ref >40)
LDL Cholesterol: 2 mg/dL (ref 0–99)
Total CHOL/HDL Ratio: 1.3 RATIO
Triglycerides: 71 mg/dL (ref <150)
VLDL: 14 mg/dL (ref 0–40)

## 2022-01-31 LAB — BASIC METABOLIC PANEL
Anion gap: 6 (ref 5–15)
BUN: 23 mg/dL (ref 8–23)
CO2: 26 mmol/L (ref 22–32)
Calcium: 8.9 mg/dL (ref 8.9–10.3)
Chloride: 109 mmol/L (ref 98–111)
Creatinine, Ser: 1.32 mg/dL — ABNORMAL HIGH (ref 0.44–1.00)
GFR, Estimated: 42 mL/min — ABNORMAL LOW (ref >60)
Glucose, Bld: 123 mg/dL — ABNORMAL HIGH (ref 70–99)
Potassium: 4.5 mmol/L (ref 3.5–5.1)
Sodium: 141 mmol/L (ref 135–145)

## 2022-01-31 LAB — CBC
HCT: 37.8 % (ref 36.0–46.0)
Hemoglobin: 13 g/dL (ref 12.0–15.0)
MCH: 31.3 pg (ref 26.0–34.0)
MCHC: 34.4 g/dL (ref 30.0–36.0)
MCV: 91.1 fL (ref 80.0–100.0)
Platelets: 256 10*3/uL (ref 150–400)
RBC: 4.15 MIL/uL (ref 3.87–5.11)
RDW: 12.9 % (ref 11.5–15.5)
WBC: 6.5 10*3/uL (ref 4.0–10.5)
nRBC: 0 % (ref 0.0–0.2)

## 2022-01-31 NOTE — Patient Instructions (Signed)
There has been no changes to your medications   Labs done today, your results will be available in MyChart, we will contact you for abnormal readings.  Your provider has recommended that  you wear a Zio Patch for 14 days.  This monitor will record your heart rhythm for our review.  IF you have any symptoms while wearing the monitor please press the button.  If you have any issues with the patch or you notice a red or orange light on it please call the company at [phone].  Once you remove the patch please mail it back to the company as soon as possible so we can get the results.   You are scheduled for a Cardiac Catheterization on Friday, September 8 with Dr. Loralie Champagne.  1. Please arrive at the [facility] (Main Entrance A) at [facility]: [address] [place], [place] [address] at 7:00 AM (This time is two hours before your procedure to ensure your preparation). Free valet parking service is available.   Special note: Every effort is made to have your procedure done on time. Please understand that emergencies sometimes delay scheduled procedures.  2. Diet: Do not eat solid foods after midnight.  The patient may have clear liquids until 5am upon the day of the procedure.  3. Medication instructions in preparation for your procedure:   Contrast Allergy: No  Stop taking, Spironolactone  Friday, September 8,  On the morning of your procedure, take your Aspirin and any morning medicines NOT listed above.  You may use sips of water.  5. Plan for one night stay--bring personal belongings. 6. Bring a current list of your medications and current insurance cards. 7. You MUST have a responsible person to drive you home. 8. Someone MUST be with you the first 24 hours after you arrive home or your discharge will be delayed. 9. Please wear clothes that are easy to get on and off and wear slip-on shoes.  Your physician recommends that you schedule a follow-up appointment in: 2  weeks after your Cath  If you have any questions or concerns before your next appointment please send [place] a message through [place] or call our office at [phone].    TO LEAVE A MESSAGE FOR THE NURSE SELECT OPTION 2, PLEASE LEAVE A MESSAGE INCLUDING: YOUR NAME DATE OF BIRTH CALL BACK NUMBER REASON FOR CALL**this is important as we prioritize the call backs  YOU WILL RECEIVE A CALL BACK THE SAME DAY AS LONG AS YOU CALL BEFORE 4:00 PM  At the [facility], you and your health needs are our priority. As part of our continuing mission to provide you with exceptional heart care, we have created designated Provider Care Teams. These Care Teams include your primary Cardiologist (physician) and Advanced Practice Providers (APPs- Physician Assistants and Nurse Practitioners) who all work together to provide you with the care you need, when you need it.   You may see any of the following providers on your designated Care Team at your next follow up: Dr Glori Bickers Dr Loralie Champagne Dr. Roxana Hires, NP [name], [place] [facility] [place], [place] Forestine Na, NP Audry Riles, PharmD   Please be sure to bring in all your medications bottles to every appointment.

## 2022-02-02 NOTE — Progress Notes (Signed)
PCP: [name], MD Cardiology: Dr. Aundra Dubin  76 y.o. with history of CAD, diastolic CHF, and exertional dyspnea was self-referred to CHF clinic.  I take care of her husband, Analeia Ismael.  She has struggled with exertional dyspnea for the last several months.  Last echo in 1/19 showed vigorous LV systolic function with a mild intracavitary gradient.  Given exertional dyspnea and chest pressure, she had LHC in 5/21, showing 95% pRCA stenosis that was treated with DES.   Despite PCI, she did not feel much better.  She continued to have exertional dyspnea and chest pressure.  Her chest pressure was nonexertional and happened at random.  Echo was done in 6/21, showing hyperdynamic LV with EF 65-70%, moderate LVH, normal RV.  I increased her Toprol XL to see if this would help her diastolic function and also started her on low dose Lasix 20 mg daily.  Creatinine rose to 2.33 and Lasix was later stopped, creatinine dropped back to 1.08.  She now takes Lasix 20 mg three times a week.   Cardiolite in 3/22 was a low risk study.   She was unable to tolerate Jardiance due to recurrent yeast infections.   Echo in 3/23 showed EF 63%, mild-moderate MR, RV normal, IVC normal.   Today she returns for diastolic CHF and CAD follow up. Weight is down 12 lbs.  She has been feeling worse recently.  She has had two types of symptoms.  Firstly, she gets episodes of fatigue/sweating and tachycardia.  These are not associated with lightheadedness or syncope.   No trigger but they are bothersome.  Secondly, for the last two months, she has been having episodes of chest heaviness and dyspnea with exertion.  Symptoms will come with walking 50-100 feet and resolve with rest.  She has taken NTG twice recently for chest heaviness, both times it resolved with the NTG.  She was in [place] last week and was walking more, the chest heaviness was very prominent while there.   ECG (personally reviewed): NSR, LAFB  Labs (5/21): K  4.1, creatinine 1.07. NT-proBNP 721 Labs (6/21): creatinine 2.33 Labs (7/21): LDL 72, K 3.7, creatinine 1.08 Labs (9/21): LDL 93, HDL 40 Labs (2/22): LDL 10, HDL 52 Labs (4/22): K 3.9, creatinine 1.74 Labs (6/22): K 3.5, creatinine 1.10 Labs (8/22): LDL 7 Labs (9/22): K 3.9, creatinine 1.26 Labs (2/23): K 4.9, creatinine 1.35  PMH:  1. Diastolic CHF: Echo in 3/22 with EF 65-70%, mild LV intracavitary gradient, mild LVH, very mild AS, RV size and systolic function normal.   - Echo (6/21): EF 65-70%, moderate LVH, normal RV, mild MR.  - Echo (3/23): EF 63%, mild-moderate MR, RV normal, IVC normal.  2. Hyperlipidemia; Myalgias with atorvastatin, tolerates low dose Crestor.  3. CAD: LHC in 2017 with moderate nonobstructive disease.  - LHC (5/21): 70% D1, 95% pRCA, 60% mLAD, 60% dLAD. DES to RCA.  - Cardiolite (3/22): small partially reversible mid anterior defect, low risk and possibly shifting breast artifact.  4. HTN: Edema with amlodipine.  - Renal artery dopplers in 3/22: No evidence for renal artery stenosis.  5. Gastroparesis 6. OSA 7. CKD stage 3  ROS: All systems reviewed and negative except as per HPI.   Social History   Socioeconomic History   Marital status: Married    Spouse name: Not on file   Number of children: Not on file   Years of education: Not on file   Highest education level: Not on file  Occupational  History   Not on file  Tobacco Use   Smoking status: Never   Smokeless tobacco: Never  Substance and Sexual Activity   Alcohol use: No    Alcohol/week: 0.0 standard drinks of alcohol   Drug use: No   Sexual activity: Not on file  Other Topics Concern   Not on file  Social History Narrative   Not on file   Social Determinants of Health   Financial Resource Strain: Not on file  Food Insecurity: Not on file  Transportation Needs: Not on file  Physical Activity: Not on file  Stress: Not on file  Social Connections: Not on file  Intimate Partner  Violence: Not on file   Family History  Problem Relation Age of Onset   Hypertension Mother    Heart disease Mother    Heart attack Father    Stroke Father    Diabetes Sister    Hypertension Sister    Stroke Other    Diabetes Other    Heart disease Other    Current Outpatient Medications  Medication Sig Dispense Refill   aspirin EC 81 MG tablet Take 81 mg by mouth daily.     DULoxetine (CYMBALTA) 20 MG capsule Take 20 mg by mouth 2 (two) times daily.      ENTRESTO 97-103 MG TAKE 1 TABLET TWICE A DAY 180 tablet 3   escitalopram (LEXAPRO) 10 MG tablet Take 10 mg by mouth daily.     esomeprazole (NEXIUM) 40 MG capsule Take 40 mg by mouth daily at 12 noon.     Evolocumab (REPATHA SURECLICK) 338 MG/ML SOAJ Inject 1 pen into the skin every 14 (fourteen) days. 6 mL 3   fluticasone (FLONASE) 50 MCG/ACT nasal spray Place 2 sprays into both nostrils daily as needed for allergies (seasonal allergies).      furosemide (LASIX) 20 MG tablet Take 1 tablet (20 mg total) by mouth as needed for fluid or edema. 90 tablet 0   hydrochlorothiazide (HYDRODIURIL) 25 MG tablet Take 1 tablet (25 mg total) by mouth daily. 90 tablet 3   isosorbide mononitrate (IMDUR) 60 MG 24 hr tablet TAKE 1 TABLET DAILY 90 tablet 3   levorphanol (LEVODROMORAN) 2 MG tablet Take 2 mg by mouth every 8 (eight) hours as needed for pain.     metoprolol succinate (TOPROL-XL) 100 MG 24 hr tablet TAKE 1 TABLET TWICE A DAY,TAKE WITH OR IMMEDIATELY FOLLOWING A MEAL (DISCONTINUE METOPROLOL TARTRATE) 180 tablet 3   nitroGLYCERIN (NITROSTAT) 0.4 MG SL tablet DISSOLVE 1 TABLET UNDER THE TONGUE EVERY 5 MINUTES AS NEEDED FOR CHEST PAIN, AS DIRECTED. 25 tablet 11   ondansetron (ZOFRAN) 4 MG tablet Take 4 mg by mouth every 8 (eight) hours as needed for nausea or vomiting.     rOPINIRole (REQUIP) 1 MG tablet Take 1-2 mg by mouth See admin instructions. Take 2 tablets (2 mg) by mouth daily at bedtime, may also take 1 tablet (1 mg) in the afternoon  15:00 for restless legs     rosuvastatin (CRESTOR) 10 MG tablet Take 1 tablet (10 mg total) by mouth at bedtime. 90 tablet 3   spironolactone (ALDACTONE) 25 MG tablet TAKE 1 TABLET DAILY 30 tablet 11   tiZANidine (ZANAFLEX) 4 MG tablet Take 4 mg by mouth every 8 (eight) hours as needed for muscle spasms.      ezetimibe (ZETIA) 10 MG tablet Take 10 mg by mouth daily. (Patient not taking: Reported on 01/31/2022)     No current facility-administered  medications for this encounter.   Wt Readings from Last 3 Encounters:  01/31/22 80.9 kg (178 lb 6.4 oz)  08/05/21 82.7 kg (182 lb 6.4 oz)  07/16/21 83.7 kg (184 lb 9.6 oz)   BP 102/60   Wt 80.9 kg (178 lb 6.4 oz)   SpO2 98%   BMI 28.79 kg/m  General: NAD Neck: No JVD, no thyromegaly or thyroid nodule.  Lungs: Clear to auscultation bilaterally with normal respiratory effort. CV: Nondisplaced PMI.  Heart regular S1/S2, no S3/S4, no murmur.  No peripheral edema.  No carotid bruit.  Normal pedal pulses.  Abdomen: Soft, nontender, no hepatosplenomegaly, no distention.  Skin: Intact without lesions or rashes.  Neurologic: Alert and oriented x 3.  Psych: Normal affect. Extremities: No clubbing or cyanosis.  HEENT: Normal.   Assessment/Plan:  1. Chronic diastolic CHF: Echo in 2/72 showed vigorous LV systolic function EF 53-66% with intracavity gradient.  Echo in 6/21 showed EF 65-70% with moderate LVH, normal RV.  Echo in 3/23 showed EF 63%, mild-moderate MR, RV normal, IVC normal.  She is not volume overloaded.  She has NYHA class III dyspnea but the dyspnea over the last couple of months has been associated with chest heaviness concerning for ischemia.  - Continue Toprol XL 100 mg bid. - Unable to tolerate Jardiance due to yeast infections.  - Continue Imdur 60 mg daily.  - Continue Entresto 97/103 mg bid. - Continue Lasix 20 mg as needed.  - Check BMET/BNP today.  2. Coronary artery disease: DES to RCA in 5/21. Cardiolite in 3/22 was low risk  (small partially reversible mid-anterior defect may be shifting breast artifact). Over the last two months, she has developed worsening exertional chest heaviness that resolves with rest, NTG.  I am worried that she has developed progressive CAD with ischemia. ECG is unchanged.  - Continue Imdur 60 mg daily and Toprol XL 100 mg bid.  - If she has recurrent chest pain not resolving with rest or NTG, needs to go to ER.  - With exertional chest pain concerning for ischemia and also exertional dyspnea, I will arrange for RHC/LHC. We discussed risks/benefits and she agrees to procedure.  - Continue ASA 81.  - Continue Crestor 10 g daily and Repatha.  Check lipids today.  3. CKD stage 3: Creatinine up to 2.33 with Lasix daily, last creatinine in 2/23 was 1.35.     - BMET today.  4. HTN: BP controlled.  Renal artery dopplers in 3/22 without renal artery stenosis.  5. OSA: Continue CPAP.  6. Palpitations: Patient has episodes of palpitations/tachycardia that seem separate from her exertional symptoms.  - I will arrange for 14 day Zio monitor to assess for atrial fibrillation/atrial arrhythmia.   Followup in 2 wks post-cath with APP.   Loralie Champagne  02/02/2022

## 2022-02-02 NOTE — H&P (View-Only) (Signed)
PCP: [name], MD Cardiology: Dr. Aundra Dubin  76 y.o. with history of CAD, diastolic CHF, and exertional dyspnea was self-referred to CHF clinic.  I take care of her husband, Angela [name].  She has struggled with exertional dyspnea for the last several months.  Last echo in 1/19 showed vigorous LV systolic function with a mild intracavitary gradient.  Given exertional dyspnea and chest pressure, she had LHC in 5/21, showing 95% pRCA stenosis that was treated with DES.   Despite PCI, she did not feel much better.  She continued to have exertional dyspnea and chest pressure.  Her chest pressure was nonexertional and happened at random.  Echo was done in 6/21, showing hyperdynamic LV with EF 65-70%, moderate LVH, normal RV.  I increased her Toprol XL to see if this would help her diastolic function and also started her on low dose Lasix 20 mg daily.  Creatinine rose to 2.33 and Lasix was later stopped, creatinine dropped back to 1.08.  She now takes Lasix 20 mg three times a week.   Cardiolite in 3/22 was a low risk study.   She was unable to tolerate Jardiance due to recurrent yeast infections.   Echo in 3/23 showed EF 63%, mild-moderate MR, RV normal, IVC normal.   Today she returns for diastolic CHF and CAD follow up. Weight is down 12 lbs.  She has been feeling worse recently.  She has had two types of symptoms.  Firstly, she gets episodes of fatigue/sweating and tachycardia.  These are not associated with lightheadedness or syncope.   No trigger but they are bothersome.  Secondly, for the last two months, she has been having episodes of chest heaviness and dyspnea with exertion.  Symptoms will come with walking 50-100 feet and resolve with rest.  She has taken NTG twice recently for chest heaviness, both times it resolved with the NTG.  She was in [place] last week and was walking more, the chest heaviness was very prominent while there.   ECG (personally reviewed): NSR, LAFB  Labs (5/21): K  4.1, creatinine 1.07. NT-proBNP 721 Labs (6/21): creatinine 2.33 Labs (7/21): LDL 72, K 3.7, creatinine 1.08 Labs (9/21): LDL 93, HDL 40 Labs (2/22): LDL 10, HDL 52 Labs (4/22): K 3.9, creatinine 1.74 Labs (6/22): K 3.5, creatinine 1.10 Labs (8/22): LDL 7 Labs (9/22): K 3.9, creatinine 1.26 Labs (2/23): K 4.9, creatinine 1.35  PMH:  1. Diastolic CHF: Echo in 9/56 with EF 65-70%, mild LV intracavitary gradient, mild LVH, very mild AS, RV size and systolic function normal.   - Echo (6/21): EF 65-70%, moderate LVH, normal RV, mild MR.  - Echo (3/23): EF 63%, mild-moderate MR, RV normal, IVC normal.  2. Hyperlipidemia; Myalgias with atorvastatin, tolerates low dose Crestor.  3. CAD: LHC in 2017 with moderate nonobstructive disease.  - LHC (5/21): 70% D1, 95% pRCA, 60% mLAD, 60% dLAD. DES to RCA.  - Cardiolite (3/22): small partially reversible mid anterior defect, low risk and possibly shifting breast artifact.  4. HTN: Edema with amlodipine.  - Renal artery dopplers in 3/22: No evidence for renal artery stenosis.  5. Gastroparesis 6. OSA 7. CKD stage 3  ROS: All systems reviewed and negative except as per HPI.   Social History   Socioeconomic History   Marital status: Married    Spouse name: Not on file   Number of children: Not on file   Years of education: Not on file   Highest education level: Not on file  Occupational  History   Not on file  Tobacco Use   Smoking status: Never   Smokeless tobacco: Never  Substance and Sexual Activity   Alcohol use: No    Alcohol/week: 0.0 standard drinks of alcohol   Drug use: No   Sexual activity: Not on file  Other Topics Concern   Not on file  Social History Narrative   Not on file   Social Determinants of Health   Financial Resource Strain: Not on file  Food Insecurity: Not on file  Transportation Needs: Not on file  Physical Activity: Not on file  Stress: Not on file  Social Connections: Not on file  Intimate Partner  Violence: Not on file   Family History  Problem Relation Age of Onset   Hypertension Mother    Heart disease Mother    Heart attack Father    Stroke Father    Diabetes Sister    Hypertension Sister    Stroke Other    Diabetes Other    Heart disease Other    Current Outpatient Medications  Medication Sig Dispense Refill   aspirin EC 81 MG tablet Take 81 mg by mouth daily.     DULoxetine (CYMBALTA) 20 MG capsule Take 20 mg by mouth 2 (two) times daily.      ENTRESTO 97-103 MG TAKE 1 TABLET TWICE A DAY 180 tablet 3   escitalopram (LEXAPRO) 10 MG tablet Take 10 mg by mouth daily.     esomeprazole (NEXIUM) 40 MG capsule Take 40 mg by mouth daily at 12 noon.     Evolocumab (REPATHA SURECLICK) 102 MG/ML SOAJ Inject 1 pen into the skin every 14 (fourteen) days. 6 mL 3   fluticasone (FLONASE) 50 MCG/ACT nasal spray Place 2 sprays into both nostrils daily as needed for allergies (seasonal allergies).      furosemide (LASIX) 20 MG tablet Take 1 tablet (20 mg total) by mouth as needed for fluid or edema. 90 tablet 0   hydrochlorothiazide (HYDRODIURIL) 25 MG tablet Take 1 tablet (25 mg total) by mouth daily. 90 tablet 3   isosorbide mononitrate (IMDUR) 60 MG 24 hr tablet TAKE 1 TABLET DAILY 90 tablet 3   levorphanol (LEVODROMORAN) 2 MG tablet Take 2 mg by mouth every 8 (eight) hours as needed for pain.     metoprolol succinate (TOPROL-XL) 100 MG 24 hr tablet TAKE 1 TABLET TWICE A DAY,TAKE WITH OR IMMEDIATELY FOLLOWING A MEAL (DISCONTINUE METOPROLOL TARTRATE) 180 tablet 3   nitroGLYCERIN (NITROSTAT) 0.4 MG SL tablet DISSOLVE 1 TABLET UNDER THE TONGUE EVERY 5 MINUTES AS NEEDED FOR CHEST PAIN, AS DIRECTED. 25 tablet 11   ondansetron (ZOFRAN) 4 MG tablet Take 4 mg by mouth every 8 (eight) hours as needed for nausea or vomiting.     rOPINIRole (REQUIP) 1 MG tablet Take 1-2 mg by mouth See admin instructions. Take 2 tablets (2 mg) by mouth daily at bedtime, may also take 1 tablet (1 mg) in the afternoon  15:00 for restless legs     rosuvastatin (CRESTOR) 10 MG tablet Take 1 tablet (10 mg total) by mouth at bedtime. 90 tablet 3   spironolactone (ALDACTONE) 25 MG tablet TAKE 1 TABLET DAILY 30 tablet 11   tiZANidine (ZANAFLEX) 4 MG tablet Take 4 mg by mouth every 8 (eight) hours as needed for muscle spasms.      ezetimibe (ZETIA) 10 MG tablet Take 10 mg by mouth daily. (Patient not taking: Reported on 01/31/2022)     No current facility-administered  medications for this encounter.   Wt Readings from Last 3 Encounters:  01/31/22 80.9 kg (178 lb 6.4 oz)  08/05/21 82.7 kg (182 lb 6.4 oz)  07/16/21 83.7 kg (184 lb 9.6 oz)   BP 102/60   Wt 80.9 kg (178 lb 6.4 oz)   SpO2 98%   BMI 28.79 kg/m  General: NAD Neck: No JVD, no thyromegaly or thyroid nodule.  Lungs: Clear to auscultation bilaterally with normal respiratory effort. CV: Nondisplaced PMI.  Heart regular S1/S2, no S3/S4, no murmur.  No peripheral edema.  No carotid bruit.  Normal pedal pulses.  Abdomen: Soft, nontender, no hepatosplenomegaly, no distention.  Skin: Intact without lesions or rashes.  Neurologic: Alert and oriented x 3.  Psych: Normal affect. Extremities: No clubbing or cyanosis.  HEENT: Normal.   Assessment/Plan:  1. Chronic diastolic CHF: Echo in 4/00 showed vigorous LV systolic function EF 86-76% with intracavity gradient.  Echo in 6/21 showed EF 65-70% with moderate LVH, normal RV.  Echo in 3/23 showed EF 63%, mild-moderate MR, RV normal, IVC normal.  She is not volume overloaded.  She has NYHA class III dyspnea but the dyspnea over the last couple of months has been associated with chest heaviness concerning for ischemia.  - Continue Toprol XL 100 mg bid. - Unable to tolerate Jardiance due to yeast infections.  - Continue Imdur 60 mg daily.  - Continue Entresto 97/103 mg bid. - Continue Lasix 20 mg as needed.  - Check BMET/BNP today.  2. Coronary artery disease: DES to RCA in 5/21. Cardiolite in 3/22 was low risk  (small partially reversible mid-anterior defect may be shifting breast artifact). Over the last two months, she has developed worsening exertional chest heaviness that resolves with rest, NTG.  I am worried that she has developed progressive CAD with ischemia. ECG is unchanged.  - Continue Imdur 60 mg daily and Toprol XL 100 mg bid.  - If she has recurrent chest pain not resolving with rest or NTG, needs to go to ER.  - With exertional chest pain concerning for ischemia and also exertional dyspnea, I will arrange for RHC/LHC. We discussed risks/benefits and she agrees to procedure.  - Continue ASA 81.  - Continue Crestor 10 g daily and Repatha.  Check lipids today.  3. CKD stage 3: Creatinine up to 2.33 with Lasix daily, last creatinine in 2/23 was 1.35.     - BMET today.  4. HTN: BP controlled.  Renal artery dopplers in 3/22 without renal artery stenosis.  5. OSA: Continue CPAP.  6. Palpitations: Patient has episodes of palpitations/tachycardia that seem separate from her exertional symptoms.  - I will arrange for 14 day Zio monitor to assess for atrial fibrillation/atrial arrhythmia.   Followup in 2 wks post-cath with APP.   Loralie Champagne  02/02/2022

## 2022-02-06 DIAGNOSIS — Z79891 Long term (current) use of opiate analgesic: Secondary | ICD-10-CM | POA: Diagnosis not present

## 2022-02-06 DIAGNOSIS — M5030 Other cervical disc degeneration, unspecified cervical region: Secondary | ICD-10-CM | POA: Diagnosis not present

## 2022-02-06 DIAGNOSIS — G894 Chronic pain syndrome: Secondary | ICD-10-CM | POA: Diagnosis not present

## 2022-02-06 DIAGNOSIS — M546 Pain in thoracic spine: Secondary | ICD-10-CM | POA: Diagnosis not present

## 2022-02-06 DIAGNOSIS — M5412 Radiculopathy, cervical region: Secondary | ICD-10-CM | POA: Diagnosis not present

## 2022-02-06 DIAGNOSIS — M961 Postlaminectomy syndrome, not elsewhere classified: Secondary | ICD-10-CM | POA: Diagnosis not present

## 2022-02-06 DIAGNOSIS — M25519 Pain in unspecified shoulder: Secondary | ICD-10-CM | POA: Diagnosis not present

## 2022-02-06 DIAGNOSIS — M25561 Pain in right knee: Secondary | ICD-10-CM | POA: Diagnosis not present

## 2022-02-06 DIAGNOSIS — M5459 Other low back pain: Secondary | ICD-10-CM | POA: Diagnosis not present

## 2022-02-06 DIAGNOSIS — M797 Fibromyalgia: Secondary | ICD-10-CM | POA: Diagnosis not present

## 2022-02-06 DIAGNOSIS — M4322 Fusion of spine, cervical region: Secondary | ICD-10-CM | POA: Diagnosis not present

## 2022-02-06 DIAGNOSIS — M25562 Pain in left knee: Secondary | ICD-10-CM | POA: Diagnosis not present

## 2022-02-07 ENCOUNTER — Other Ambulatory Visit: Payer: Self-pay

## 2022-02-07 ENCOUNTER — Ambulatory Visit (HOSPITAL_COMMUNITY)
Admission: RE | Admit: 2022-02-07 | Discharge: 2022-02-07 | Disposition: A | Discharge status: Home / Self Care | Payer: Medicare Other | Source: Ambulatory Visit | Attending: Cardiology | Admitting: Cardiology

## 2022-02-07 ENCOUNTER — Encounter (HOSPITAL_COMMUNITY)
Admission: RE | Disposition: A | Discharge status: Home / Self Care | Payer: Self-pay | Source: Ambulatory Visit | Attending: Cardiology

## 2022-02-07 DIAGNOSIS — E785 Hyperlipidemia, unspecified: Secondary | ICD-10-CM | POA: Diagnosis not present

## 2022-02-07 DIAGNOSIS — G4733 Obstructive sleep apnea (adult) (pediatric): Secondary | ICD-10-CM | POA: Diagnosis not present

## 2022-02-07 DIAGNOSIS — Z955 Presence of coronary angioplasty implant and graft: Secondary | ICD-10-CM | POA: Insufficient documentation

## 2022-02-07 DIAGNOSIS — I2510 Atherosclerotic heart disease of native coronary artery without angina pectoris: Secondary | ICD-10-CM | POA: Diagnosis not present

## 2022-02-07 DIAGNOSIS — Z79899 Other long term (current) drug therapy: Secondary | ICD-10-CM | POA: Diagnosis not present

## 2022-02-07 DIAGNOSIS — I5032 Chronic diastolic (congestive) heart failure: Secondary | ICD-10-CM | POA: Insufficient documentation

## 2022-02-07 DIAGNOSIS — I130 Hypertensive heart and chronic kidney disease with heart failure and stage 1 through stage 4 chronic kidney disease, or unspecified chronic kidney disease: Secondary | ICD-10-CM | POA: Insufficient documentation

## 2022-02-07 DIAGNOSIS — R002 Palpitations: Secondary | ICD-10-CM | POA: Insufficient documentation

## 2022-02-07 DIAGNOSIS — I509 Heart failure, unspecified: Secondary | ICD-10-CM | POA: Diagnosis not present

## 2022-02-07 DIAGNOSIS — Z7982 Long term (current) use of aspirin: Secondary | ICD-10-CM | POA: Insufficient documentation

## 2022-02-07 DIAGNOSIS — I5022 Chronic systolic (congestive) heart failure: Secondary | ICD-10-CM

## 2022-02-07 DIAGNOSIS — R0609 Other forms of dyspnea: Secondary | ICD-10-CM | POA: Diagnosis not present

## 2022-02-07 DIAGNOSIS — N183 Chronic kidney disease, stage 3 unspecified: Secondary | ICD-10-CM | POA: Insufficient documentation

## 2022-02-07 HISTORY — PX: RIGHT/LEFT HEART CATH AND CORONARY ANGIOGRAPHY: CATH118266

## 2022-02-07 SURGERY — RIGHT/LEFT HEART CATH AND CORONARY ANGIOGRAPHY
Anesthesia: LOCAL

## 2022-02-07 MED ORDER — HEPARIN SODIUM (PORCINE) 1000 UNIT/ML IJ SOLN
INTRAMUSCULAR | Status: AC
  Filled 2022-02-07: qty 10

## 2022-02-07 MED ORDER — SODIUM CHLORIDE 0.9% FLUSH: 3.0000 mL | INTRAVENOUS | Status: DC | PRN

## 2022-02-07 MED ORDER — IOHEXOL 350 MG/ML SOLN
INTRAVENOUS | Status: DC | PRN
  Administered 2022-02-07: 45 mL

## 2022-02-07 MED ORDER — ISOSORBIDE MONONITRATE ER 60 MG PO TB24: 90.0000 mg | ORAL_TABLET | Freq: Every day | ORAL | 3 refills | Status: DC

## 2022-02-07 MED ORDER — HEPARIN (PORCINE) IN NACL 1000-0.9 UT/500ML-% IV SOLN
INTRAVENOUS | Status: AC
  Filled 2022-02-07: qty 1000

## 2022-02-07 MED ORDER — HEPARIN SODIUM (PORCINE) 1000 UNIT/ML IJ SOLN
INTRAMUSCULAR | Status: DC | PRN
  Administered 2022-02-07: 4000 [IU] via INTRAVENOUS

## 2022-02-07 MED ORDER — ASPIRIN 81 MG PO CHEW
81.0000 mg | CHEWABLE_TABLET | Freq: Once | ORAL | Status: AC
  Administered 2022-02-07: 81 mg via ORAL
  Filled 2022-02-07: qty 1

## 2022-02-07 MED ORDER — FENTANYL CITRATE (PF) 100 MCG/2ML IJ SOLN
INTRAMUSCULAR | Status: DC | PRN
Start: 2022-02-07 — End: 2022-02-07
  Administered 2022-02-07: 25 ug via INTRAVENOUS

## 2022-02-07 MED ORDER — LABETALOL HCL 5 MG/ML IV SOLN: 10.0000 mg | INTRAVENOUS | Status: DC | PRN

## 2022-02-07 MED ORDER — SODIUM CHLORIDE 0.9 % IV SOLN: 250.0000 mL | INTRAVENOUS | Status: DC | PRN

## 2022-02-07 MED ORDER — LIDOCAINE HCL (PF) 1 % IJ SOLN
INTRAMUSCULAR | Status: DC | PRN
  Administered 2022-02-07 (×2): 2 mL

## 2022-02-07 MED ORDER — FENTANYL CITRATE (PF) 100 MCG/2ML IJ SOLN
INTRAMUSCULAR | Status: AC
  Filled 2022-02-07: qty 2

## 2022-02-07 MED ORDER — MIDAZOLAM HCL 2 MG/2ML IJ SOLN
INTRAMUSCULAR | Status: AC
  Filled 2022-02-07: qty 2

## 2022-02-07 MED ORDER — MIDAZOLAM HCL 2 MG/2ML IJ SOLN
INTRAMUSCULAR | Status: DC | PRN
  Administered 2022-02-07: 1 mg via INTRAVENOUS

## 2022-02-07 MED ORDER — SODIUM CHLORIDE 0.9 % IV SOLN: INTRAVENOUS | Status: DC

## 2022-02-07 MED ORDER — HYDRALAZINE HCL 20 MG/ML IJ SOLN: 10.0000 mg | INTRAMUSCULAR | Status: DC | PRN

## 2022-02-07 MED ORDER — HEPARIN (PORCINE) IN NACL 1000-0.9 UT/500ML-% IV SOLN
INTRAVENOUS | Status: DC | PRN
  Administered 2022-02-07 (×2): 500 mL

## 2022-02-07 MED ORDER — SODIUM CHLORIDE 0.9% FLUSH: 3.0000 mL | Freq: Two times a day (BID) | INTRAVENOUS | Status: DC

## 2022-02-07 MED ORDER — VERAPAMIL HCL 2.5 MG/ML IV SOLN
INTRAVENOUS | Status: DC | PRN
  Administered 2022-02-07: 10 mL via INTRA_ARTERIAL

## 2022-02-07 MED ORDER — LIDOCAINE HCL (PF) 1 % IJ SOLN
INTRAMUSCULAR | Status: AC
  Filled 2022-02-07: qty 30

## 2022-02-07 MED ORDER — VERAPAMIL HCL 2.5 MG/ML IV SOLN
INTRAVENOUS | Status: AC
  Filled 2022-02-07: qty 2

## 2022-02-07 MED ORDER — ACETAMINOPHEN 325 MG PO TABS: 650.0000 mg | ORAL_TABLET | ORAL | Status: DC | PRN

## 2022-02-07 MED ORDER — ONDANSETRON HCL 4 MG/2ML IJ SOLN: 4.0000 mg | Freq: Four times a day (QID) | INTRAMUSCULAR | Status: DC | PRN

## 2022-02-07 SURGICAL SUPPLY — 11 items
BAND ZEPHYR COMPRESS 30 LONG (HEMOSTASIS) IMPLANT
CATH 5FR JL3.5 JR4 ANG PIG MP (CATHETERS) IMPLANT
CATH BALLN WEDGE 5F 110CM (CATHETERS) IMPLANT
GLIDESHEATH SLEND SS 6F .021 (SHEATH) IMPLANT
GUIDEWIRE .025 260CM (WIRE) IMPLANT
GUIDEWIRE INQWIRE 1.5J.035X260 (WIRE) IMPLANT
INQWIRE 1.5J .035X260CM (WIRE) ×1
KIT HEART LEFT (KITS) ×1 IMPLANT
PACK CARDIAC CATHETERIZATION (CUSTOM PROCEDURE TRAY) ×1 IMPLANT
SHEATH GLIDE SLENDER 4/5FR (SHEATH) IMPLANT
TRANSDUCER W/STOPCOCK (MISCELLANEOUS) ×1 IMPLANT

## 2022-02-07 NOTE — Discharge Instructions (Addendum)
Increase Imdur to 90 mg once daily (1 and 1/2 tabs).   Radial Site Care Drink plenty of fluid for the next 3 days  Keep arm elevated for the next 24 hours The following information offers guidance on how to care for yourself after your procedure. Your health care provider may also give you more specific instructions. If you have problems or questions, contact your health care provider. What can I expect after the procedure? After the procedure, it is common to have bruising and tenderness in the incision area. Follow these instructions at home: Incision site care  Follow instructions from your health care provider about how to take care of your incision site. Make sure you:  Remove your dressing 24 hours after discharge.  Do not take baths, swim, or use a hot tub for 1 week. You may shower 24 hours after the procedure or as told by your health care provider. Remove the dressing and gently wash the incision area with plain soap and water. Pat the area dry with a clean towel. Do not rub the site. That could cause bleeding. Do not apply powder or lotion to the site. Check your incision site every day for signs of infection. Check for: Redness, swelling, or pain. Fluid or blood. Warmth. Pus or a bad smell. Activity For 24 hours after the procedure, or as directed by your health care provider: Do not flex or bend the affected arm. Do not push or pull heavy objects with the affected arm. Do not operate machinery or power tools. Do not drive. You should not drive yourself home from the hospital or clinic if you go home during that time period. You may drive 24 hours after the procedure unless your health care provider tells you not to. Do not lift anything that is heavier than 10 lb (4.5 kg), or the limit that you are told, until your health care provider says that it is safe. Return to your normal activities as told by your health care provider. Ask your health care provider what  activities are safe for you and when you can return to work. If you were given a sedative during the procedure, it can affect you for several hours. Do not drive or operate machinery until your health care provider says that it is safe. General instructions Take over-the-counter and prescription medicines only as told by your health care provider. If you will be going home right after the procedure, plan to have a responsible adult care for you for the time you are told. This is important. Keep all follow-up visits. This is important. Contact a health care provider if: You have a fever or chills. You have any of these signs of infection at your incision site: Redness, swelling, or pain. Fluid or blood. Warmth. Pus or a bad smell. Get help right away if: The incision area swells very fast. The incision area is bleeding, and the bleeding does not stop when you hold steady pressure on the area. Your arm or hand becomes pale, cool, tingly, or numb. These symptoms may represent a serious problem that is an emergency. Do not wait to see if the symptoms will go away. Get medical help right away. Call your local emergency services (911 in the [place] Do not drive yourself to the hospital. Summary After the procedure, it is common to have bruising and tenderness at the incision site. Follow instructions from your health care provider about how to take care of your radial site incision. Check the  incision every day for signs of infection. Do not lift anything that is heavier than 10 lb (4.5 kg), or the limit that you are told, until your health care provider says that it is safe. Get help right away if the incision area swells very fast, you have bleeding at the incision site that will not stop, or your arm or hand becomes pale, cool, or numb. This information is not intended to replace advice given to you by your health care provider. Make sure you discuss any questions you have with your health care  provider. Document Revised: 07/08/2020 Document Reviewed: 07/08/2020 Elsevier Patient Education  [place].

## 2022-02-07 NOTE — Progress Notes (Signed)
TR BAND REMOVAL  LOCATION:    right radial  DEFLATED PER PROTOCOL:    Yes.    TIME BAND OFF / DRESSING APPLIED:    1130 gauze dressing applied   SITE UPON ARRIVAL:    Level 0  SITE AFTER BAND REMOVAL:    Level 0  CIRCULATION SENSATION AND MOVEMENT:    Within Normal Limits   Yes.    COMMENTS:   no issues

## 2022-02-07 NOTE — Interval H&P Note (Signed)
History and Physical Interval Note:  02/07/2022 8:45 AM  Angela [name]  has presented today for surgery, with the diagnosis of hf.  The various methods of treatment have been discussed with the patient and family. After consideration of risks, benefits and other options for treatment, the patient has consented to  Procedure(s): RIGHT/LEFT HEART CATH AND CORONARY ANGIOGRAPHY ([name]) as a surgical intervention.  The patient's history has been reviewed, patient examined, no change in status, stable for surgery.  I have reviewed the patient's chart and labs.  Questions were answered to the patient's satisfaction.     Abigal Choung [employer]

## 2022-02-10 ENCOUNTER — Encounter (HOSPITAL_COMMUNITY): Payer: Self-pay | Admitting: Cardiology

## 2022-02-10 DIAGNOSIS — G8929 Other chronic pain: Secondary | ICD-10-CM | POA: Diagnosis not present

## 2022-02-10 DIAGNOSIS — Z23 Encounter for immunization: Secondary | ICD-10-CM | POA: Diagnosis not present

## 2022-02-10 DIAGNOSIS — G2581 Restless legs syndrome: Secondary | ICD-10-CM | POA: Diagnosis not present

## 2022-02-10 DIAGNOSIS — G4700 Insomnia, unspecified: Secondary | ICD-10-CM | POA: Diagnosis not present

## 2022-02-10 LAB — POCT I-STAT EG7
Acid-Base Excess: 4 mmol/L — ABNORMAL HIGH (ref 0.0–2.0)
Bicarbonate: 30.4 mmol/L — ABNORMAL HIGH (ref 20.0–28.0)
Calcium, Ion: 1.24 mmol/L (ref 1.15–1.40)
HCT: 30 % — ABNORMAL LOW (ref 36.0–46.0)
Hemoglobin: 10.2 g/dL — ABNORMAL LOW (ref 12.0–15.0)
O2 Saturation: 64 %
Potassium: 4 mmol/L (ref 3.5–5.1)
Sodium: 143 mmol/L (ref 135–145)
TCO2: 32 mmol/L (ref 22–32)
pCO2, Ven: 55.2 mmHg (ref 44–60)
pH, Ven: 7.35 (ref 7.25–7.43)
pO2, Ven: 36 mmHg (ref 32–45)

## 2022-02-11 DIAGNOSIS — M25561 Pain in right knee: Secondary | ICD-10-CM | POA: Diagnosis not present

## 2022-02-11 DIAGNOSIS — M797 Fibromyalgia: Secondary | ICD-10-CM | POA: Diagnosis not present

## 2022-02-11 DIAGNOSIS — M961 Postlaminectomy syndrome, not elsewhere classified: Secondary | ICD-10-CM | POA: Diagnosis not present

## 2022-02-11 DIAGNOSIS — Z79891 Long term (current) use of opiate analgesic: Secondary | ICD-10-CM | POA: Diagnosis not present

## 2022-02-11 DIAGNOSIS — M5030 Other cervical disc degeneration, unspecified cervical region: Secondary | ICD-10-CM | POA: Diagnosis not present

## 2022-02-11 DIAGNOSIS — M5412 Radiculopathy, cervical region: Secondary | ICD-10-CM | POA: Diagnosis not present

## 2022-02-11 DIAGNOSIS — M4322 Fusion of spine, cervical region: Secondary | ICD-10-CM | POA: Diagnosis not present

## 2022-02-11 DIAGNOSIS — M25562 Pain in left knee: Secondary | ICD-10-CM | POA: Diagnosis not present

## 2022-02-11 DIAGNOSIS — M25519 Pain in unspecified shoulder: Secondary | ICD-10-CM | POA: Diagnosis not present

## 2022-02-11 DIAGNOSIS — M546 Pain in thoracic spine: Secondary | ICD-10-CM | POA: Diagnosis not present

## 2022-02-11 DIAGNOSIS — G894 Chronic pain syndrome: Secondary | ICD-10-CM | POA: Diagnosis not present

## 2022-02-11 DIAGNOSIS — M5459 Other low back pain: Secondary | ICD-10-CM | POA: Diagnosis not present

## 2022-02-11 LAB — POCT I-STAT EG7
Acid-Base Excess: 3 mmol/L — ABNORMAL HIGH (ref 0.0–2.0)
Bicarbonate: 29.7 mmol/L — ABNORMAL HIGH (ref 20.0–28.0)
Calcium, Ion: 1.26 mmol/L (ref 1.15–1.40)
HCT: 30 % — ABNORMAL LOW (ref 36.0–46.0)
Hemoglobin: 10.2 g/dL — ABNORMAL LOW (ref 12.0–15.0)
O2 Saturation: 67 %
Potassium: 4.1 mmol/L (ref 3.5–5.1)
Sodium: 143 mmol/L (ref 135–145)
TCO2: 31 mmol/L (ref 22–32)
pCO2, Ven: 55.2 mmHg (ref 44–60)
pH, Ven: 7.339 (ref 7.25–7.43)
pO2, Ven: 38 mmHg (ref 32–45)

## 2022-02-12 ENCOUNTER — Telehealth: Payer: Self-pay | Admitting: Cardiology

## 2022-02-12 DIAGNOSIS — G4733 Obstructive sleep apnea (adult) (pediatric): Secondary | ICD-10-CM

## 2022-02-12 NOTE — Telephone Encounter (Signed)
What problem are you experiencing? Having a hoarse sore throat when using CPAP   Who is your medical equipment company? [employer]   Patient reports she has not used her CPAP in about a month due to it causing her to go hoarse when she uses it. She is requesting a callback on what to do due to this. Please advise.   Please route to the sleep study assistant.

## 2022-02-20 NOTE — Telephone Encounter (Signed)
Please add a chinstrap to help keep her mouth closed.   Also make sure she has increased her humidity and see if that helps with sore throat.  If she gets condensation of water in the tubing or mask she is turned the humidity up too high and will need to turn it back some   Order for chin strap sent to [facility] via community message.

## 2022-02-21 ENCOUNTER — Encounter (HOSPITAL_COMMUNITY): Payer: TRICARE For Life (TFL)

## 2022-02-21 DIAGNOSIS — R002 Palpitations: Secondary | ICD-10-CM | POA: Diagnosis not present

## 2022-02-24 NOTE — Addendum Note (Signed)
Encounter addended by: Micki Riley, RN on: 02/24/2022 11:37 AM  Actions taken: Imaging Exam ended

## 2022-02-25 ENCOUNTER — Ambulatory Visit (HOSPITAL_COMMUNITY)
Admission: RE | Admit: 2022-02-25 | Discharge: 2022-02-25 | Disposition: A | Discharge status: Home / Self Care | Payer: Medicare Other | Source: Ambulatory Visit | Attending: Family Medicine | Admitting: Family Medicine

## 2022-02-25 ENCOUNTER — Encounter (HOSPITAL_COMMUNITY): Payer: Self-pay

## 2022-02-25 VITALS — BP 130/70 | HR 62 | Wt 179.8 lb

## 2022-02-25 DIAGNOSIS — G4733 Obstructive sleep apnea (adult) (pediatric): Secondary | ICD-10-CM

## 2022-02-25 DIAGNOSIS — N183 Chronic kidney disease, stage 3 unspecified: Secondary | ICD-10-CM | POA: Diagnosis not present

## 2022-02-25 DIAGNOSIS — I2510 Atherosclerotic heart disease of native coronary artery without angina pectoris: Secondary | ICD-10-CM

## 2022-02-25 DIAGNOSIS — R002 Palpitations: Secondary | ICD-10-CM

## 2022-02-25 DIAGNOSIS — I10 Essential (primary) hypertension: Secondary | ICD-10-CM | POA: Diagnosis not present

## 2022-02-25 DIAGNOSIS — I5032 Chronic diastolic (congestive) heart failure: Secondary | ICD-10-CM

## 2022-02-25 DIAGNOSIS — I130 Hypertensive heart and chronic kidney disease with heart failure and stage 1 through stage 4 chronic kidney disease, or unspecified chronic kidney disease: Secondary | ICD-10-CM | POA: Insufficient documentation

## 2022-02-25 DIAGNOSIS — R0600 Dyspnea, unspecified: Secondary | ICD-10-CM | POA: Diagnosis not present

## 2022-02-25 LAB — BASIC METABOLIC PANEL
Anion gap: 5 (ref 5–15)
BUN: 11 mg/dL (ref 8–23)
CO2: 28 mmol/L (ref 22–32)
Calcium: 8.2 mg/dL — ABNORMAL LOW (ref 8.9–10.3)
Chloride: 110 mmol/L (ref 98–111)
Creatinine, Ser: 1.26 mg/dL — ABNORMAL HIGH (ref 0.44–1.00)
GFR, Estimated: 45 mL/min — ABNORMAL LOW (ref >60)
Glucose, Bld: 99 mg/dL (ref 70–99)
Potassium: 3.7 mmol/L (ref 3.5–5.1)
Sodium: 143 mmol/L (ref 135–145)

## 2022-02-25 NOTE — Progress Notes (Signed)
PCP: [name], MD Cardiology: Dr. Aundra Dubin  76 y.o. with history of CAD, diastolic CHF, and exertional dyspnea was self-referred to CHF clinic.  I take care of her husband, Angela [name].  She has struggled with exertional dyspnea for the last several months.  Last echo in 1/19 showed vigorous LV systolic function with a mild intracavitary gradient.  Given exertional dyspnea and chest pressure, she had LHC in 5/21, showing 95% pRCA stenosis that was treated with DES.   Despite PCI, she did not feel much better.  She continued to have exertional dyspnea and chest pressure.  Her chest pressure was nonexertional and happened at random.  Echo was done in 6/21, showing hyperdynamic LV with EF 65-70%, moderate LVH, normal RV. Toprol XL increased to see if this would help her diastolic function and also started her on low dose Lasix 20 mg daily.  Creatinine rose to 2.33 and Lasix was later stopped, creatinine dropped back to 1.08.    Cardiolite in 3/22 was a low risk study.   She was unable to tolerate Jardiance due to recurrent yeast infections.   Echo in 3/23 showed EF 63%, mild-moderate MR, RV normal, IVC normal.   Follow up 8/23 she was having episodes of fatigue/sweating with tachycardia, along with episodes of chest heaviness and DOE.  14 day Zio placed to quantify palpitations. Decision made to undergo [facility] which showed near normal filling pressures, patent RCA stent, serial 50% proximal LAD stenoses, 80% ostial stenosis of a small D1. No change from prior study in the LAD/D1, does not appear to have interventional target. Would treat D1 medically.  Today she returns for post-cath HF follow up with her husband. Overall feeling fine. Chest discomfort improved on higher dose of Imdur. She has SOB walking up stairs and has positional dizziness. She has had 2 falls in the past month, 1 was due to tripping over a cord. Feels occasional palpitations. Denies CP, abnormal bleeding, edema, or PND/Orthopnea.  Appetite ok. No fever or chills. Weight at home 170 pounds. Taking all medications. BP has been stable. Has not needed Lasix recently.  ECG (personally reviewed): none ordered today.  Labs (5/21): K 4.1, creatinine 1.07. NT-proBNP 721 Labs (6/21): creatinine 2.33 Labs (7/21): LDL 72, K 3.7, creatinine 1.08 Labs (9/21): LDL 93, HDL 40 Labs (2/22): LDL 10, HDL 52 Labs (4/22): K 3.9, creatinine 1.74 Labs (6/22): K 3.5, creatinine 1.10 Labs (8/22): LDL 7 Labs (9/22): K 3.9, creatinine 1.26 Labs (2/23): K 4.9, creatinine 1.35 Labs (9/23): K 4.5, creatinine 1.32, LDL 2, HDL 47  PMH:  1. Diastolic CHF: Echo in 3/41 with EF 65-70%, mild LV intracavitary gradient, mild LVH, very mild AS, RV size and systolic function normal.   - Echo (6/21): EF 65-70%, moderate LVH, normal RV, mild MR.  - Echo (3/23): EF 63%, mild-moderate MR, RV normal, IVC normal.  - R/LHC (9/23): RA 4, PA 31/9 (mean 20), PCW mean 15, CI (Fick)  2. Hyperlipidemia; Myalgias with atorvastatin, tolerates low dose Crestor.  3. CAD: LHC in 2017 with moderate nonobstructive disease.  - LHC (5/21): 70% D1, 95% pRCA, 60% mLAD, 60% dLAD. DES to RCA.  - Cardiolite (3/22): small partially reversible mid anterior defect, low risk and possibly shifting breast artifact.  - R/LHC (9/23): near normal filling pressures, RA mean 4, PA 31/9 (mean 15), PCWP 15, CO/CI (Fick) Patent RCA stent, serial 50% proximal LAD stenoses, 80% ostial stenosis of a small D1. No change from prior study in the LAD/D1,  does not appear to have interventional target. Would treat D1 medically. 4. HTN: Edema with amlodipine.  - Renal artery dopplers in 3/22: No evidence for renal artery stenosis.  5. Gastroparesis 6. OSA 7. CKD stage 3 8. Palpitations: Zio 14 day (9/23) showed mostly NSR, several runs of SVT, no AF or worrisome arrhythmias.  ROS: All systems reviewed and negative except as per HPI.   Social History   Socioeconomic History   Marital status:  Married    Spouse name: Not on file   Number of children: Not on file   Years of education: Not on file   Highest education level: Not on file  Occupational History   Not on file  Tobacco Use   Smoking status: Never   Smokeless tobacco: Never  Substance and Sexual Activity   Alcohol use: No    Alcohol/week: 0.0 standard drinks of alcohol   Drug use: No   Sexual activity: Not on file  Other Topics Concern   Not on file  Social History Narrative   Not on file   Social Determinants of Health   Financial Resource Strain: Not on file  Food Insecurity: Not on file  Transportation Needs: Not on file  Physical Activity: Not on file  Stress: Not on file  Social Connections: Not on file  Intimate Partner Violence: Not on file   Family History  Problem Relation Age of Onset   Hypertension Mother    Heart disease Mother    Heart attack Father    Stroke Father    Diabetes Sister    Hypertension Sister    Stroke Other    Diabetes Other    Heart disease Other    Current Outpatient Medications  Medication Sig Dispense Refill   aspirin EC 81 MG tablet Take 81 mg by mouth daily.     Cyanocobalamin (B-12) 2500 MCG TABS Take 2,500 mcg by mouth daily.     diclofenac Sodium (VOLTAREN) 1 % GEL Apply 1 Application topically 4 (four) times daily as needed (pain).     DULoxetine (CYMBALTA) 20 MG capsule Take 20 mg by mouth 2 (two) times daily.      ENTRESTO 97-103 MG TAKE 1 TABLET TWICE A DAY 180 tablet 3   escitalopram (LEXAPRO) 10 MG tablet Take 10 mg by mouth every evening.     esomeprazole (NEXIUM) 40 MG capsule Take 40 mg by mouth daily.     Evolocumab (REPATHA SURECLICK) 884 MG/ML SOAJ Inject 1 pen into the skin every 14 (fourteen) days. 6 mL 3   fluticasone (FLONASE) 50 MCG/ACT nasal spray Place 2 sprays into both nostrils daily as needed for allergies (seasonal allergies).      furosemide (LASIX) 20 MG tablet Take 1 tablet (20 mg total) by mouth as needed for fluid or edema. 90  tablet 0   hydrOXYzine (ATARAX) 50 MG tablet SMARTSIG:0.5-1 Tablet(s) By Mouth PRN     ibuprofen (ADVIL) 200 MG tablet Take 400-600 mg by mouth every 6 (six) hours as needed for moderate pain.     isosorbide mononitrate (IMDUR) 60 MG 24 hr tablet Take 1.5 tablets (90 mg total) by mouth daily. 90 tablet 3   levorphanol (LEVODROMORAN) 2 MG tablet Take 2 mg by mouth 3 (three) times daily.     metoprolol succinate (TOPROL-XL) 100 MG 24 hr tablet TAKE 1 TABLET TWICE A DAY,TAKE WITH OR IMMEDIATELY FOLLOWING A MEAL (DISCONTINUE METOPROLOL TARTRATE) 180 tablet 3   nitroGLYCERIN (NITROSTAT) 0.4 MG SL tablet DISSOLVE  1 TABLET UNDER THE TONGUE EVERY 5 MINUTES AS NEEDED FOR CHEST PAIN, AS DIRECTED. 25 tablet 11   ondansetron (ZOFRAN) 4 MG tablet Take 4 mg by mouth every 8 (eight) hours as needed for nausea or vomiting.     ondansetron (ZOFRAN-ODT) 4 MG disintegrating tablet Take 4 mg by mouth every 8 (eight) hours as needed for nausea/vomiting.     rOPINIRole (REQUIP) 1 MG tablet Take 1-2 mg by mouth See admin instructions. Take 2 tablets (2 mg) by mouth daily at bedtime, may also take 1 tablet (1 mg) in the afternoon 15:00 as needed for restless legs     rosuvastatin (CRESTOR) 10 MG tablet Take 1 tablet (10 mg total) by mouth at bedtime. 90 tablet 3   spironolactone (ALDACTONE) 25 MG tablet TAKE 1 TABLET DAILY 30 tablet 11   tiZANidine (ZANAFLEX) 4 MG tablet Take 4 mg by mouth every 8 (eight) hours as needed for muscle spasms.      hydrochlorothiazide (HYDRODIURIL) 25 MG tablet Take 1 tablet (25 mg total) by mouth daily. (Patient not taking: Reported on 02/25/2022) 90 tablet 3   No current facility-administered medications for this encounter.   Wt Readings from Last 3 Encounters:  02/25/22 81.6 kg (179 lb 12.8 oz)  02/07/22 80.8 kg (178 lb 3.2 oz)  01/31/22 80.9 kg (178 lb 6.4 oz)   BP 130/70   Pulse 62   Wt 81.6 kg (179 lb 12.8 oz)   SpO2 99%   BMI 29.02 kg/m  Physical Exam: General:  NAD. No resp  difficulty HEENT: Normal Neck: Supple. No JVD. Carotids 2+ bilat; no bruits. No lymphadenopathy or thryomegaly appreciated. Cor: PMI nondisplaced. Regular rate & rhythm. No rubs, gallops or murmurs. Lungs: Clear Abdomen: Soft, nontender, nondistended. No hepatosplenomegaly. No bruits or masses. Good bowel sounds. Extremities: No cyanosis, clubbing, rash, edema Neuro: Alert & oriented x 3, cranial nerves grossly intact. Moves all 4 extremities w/o difficulty. Affect pleasant.  Assessment/Plan:  1. Chronic diastolic CHF: Echo in 0/10 showed vigorous LV systolic function EF 93-23% with intracavity gradient.  Echo in 6/21 showed EF 65-70% with moderate LVH, normal RV.  Echo in 3/23 showed EF 63%, mild-moderate MR, RV normal, IVC normal.  RHC (9/23) showed normal filling pressures. Improved NYHA II, she is not volume overloaded today. - OK to remain off HCTZ. - Continue Toprol XL 100 mg bid. - Unable to tolerate Jardiance due to yeast infections.  - Continue Imdur 90 mg daily.  - Continue Entresto 97/103 mg bid. BMET today. - Continue spironolactone 25 mg daily. - Continue Lasix 20 mg as needed.  2. Coronary artery disease: DES to RCA in 5/21. Cardiolite in 3/22 was low risk (small partially reversible mid-anterior defect may be shifting breast artifact). LHC (9/23) showed patent RCA stent, serial 50% proximal LAD stenoses, 80% ostial stenosis of a small D1. No change from prior study in the LAD/D1, does not appear to have interventional target. Would treat D1 medically. - Continue Imdur 90 mg daily and Toprol XL 100 mg bid.  - Continue ASA 81.  - Continue Crestor 10 g daily and Repatha.  Good lipids 9/23. - If she has recurrent chest pain not resolving with rest or NTG, needs to go to ER.  3. CKD stage 3: Creatinine up to 2.33 with Lasix daily, most recent SCr 1.26 - BMET today.  4. HTN: BP controlled.  Renal artery dopplers in 3/22 without renal artery stenosis.  5. OSA: Continue CPAP. Has  struggled  tolerating, advised following back up with Dr. Radford Pax. 6. Palpitations: 14-day Zio (8/23) showed mostly NSR, several runs of SVT, no AF or worrisome arrhythmias. We discussed these results today.  Follow up in 6 months with Dr. Aundra Dubin.  Angela [name] [facility] FNP-BC 02/25/2022

## 2022-02-25 NOTE — Patient Instructions (Signed)
It was great to see you today! No medication changes are needed at this time.   Labs today We will only contact you if something comes back abnormal or we need to make some changes. Otherwise no news is good news!  Your physician wants you to follow-up in: 6 months with Dr Aundra Dubin (March 2024) You will receive a reminder letter in the mail two months in advance. If you don't receive a letter, please call our office to schedule the follow-up appointment February 2024.   Do the following things EVERYDAY: Weigh yourself in the morning before breakfast. Write it down and keep it in a log. Take your medicines as prescribed Eat low salt foods--Limit salt (sodium) to 2000 mg per day.  Stay as active as you can everyday Limit all fluids for the day to less than 2 liters   At the [facility], you and your health needs are our priority. As part of our continuing mission to provide you with exceptional heart care, we have created designated Provider Care Teams. These Care Teams include your primary Cardiologist (physician) and Advanced Practice Providers (APPs- Physician Assistants and Nurse Practitioners) who all work together to provide you with the care you need, when you need it.   You may see any of the following providers on your designated Care Team at your next follow up: Dr Glori Bickers Dr Loralie Champagne Dr. Roxana Hires, NP [name], [place] [facility] [place], [place] Forestine Na, NP Audry Riles, PharmD   Please be sure to bring in all your medications bottles to every appointment.   If you have any questions or concerns before your next appointment please send [place] a message through [place] or call our office at [phone].    TO LEAVE A MESSAGE FOR THE NURSE SELECT OPTION 2, PLEASE LEAVE A MESSAGE INCLUDING: YOUR NAME DATE OF BIRTH CALL BACK NUMBER REASON FOR CALL**this is important as we prioritize the call backs  YOU WILL  RECEIVE A CALL BACK THE SAME DAY AS LONG AS YOU CALL BEFORE 4:00 PM

## 2022-03-11 DIAGNOSIS — M5459 Other low back pain: Secondary | ICD-10-CM | POA: Diagnosis not present

## 2022-03-11 DIAGNOSIS — G894 Chronic pain syndrome: Secondary | ICD-10-CM | POA: Diagnosis not present

## 2022-03-11 DIAGNOSIS — M797 Fibromyalgia: Secondary | ICD-10-CM | POA: Diagnosis not present

## 2022-03-11 DIAGNOSIS — M25562 Pain in left knee: Secondary | ICD-10-CM | POA: Diagnosis not present

## 2022-03-11 DIAGNOSIS — M4322 Fusion of spine, cervical region: Secondary | ICD-10-CM | POA: Diagnosis not present

## 2022-03-11 DIAGNOSIS — M25561 Pain in right knee: Secondary | ICD-10-CM | POA: Diagnosis not present

## 2022-03-11 DIAGNOSIS — Z79891 Long term (current) use of opiate analgesic: Secondary | ICD-10-CM | POA: Diagnosis not present

## 2022-03-11 DIAGNOSIS — M25519 Pain in unspecified shoulder: Secondary | ICD-10-CM | POA: Diagnosis not present

## 2022-03-11 DIAGNOSIS — M5412 Radiculopathy, cervical region: Secondary | ICD-10-CM | POA: Diagnosis not present

## 2022-03-11 DIAGNOSIS — M5030 Other cervical disc degeneration, unspecified cervical region: Secondary | ICD-10-CM | POA: Diagnosis not present

## 2022-03-11 DIAGNOSIS — M546 Pain in thoracic spine: Secondary | ICD-10-CM | POA: Diagnosis not present

## 2022-03-11 DIAGNOSIS — M961 Postlaminectomy syndrome, not elsewhere classified: Secondary | ICD-10-CM | POA: Diagnosis not present

## 2022-03-31 NOTE — Telephone Encounter (Addendum)
Patient called in to say she is going to turn her cap back in. Patient states she is done with trying to use her cpap. Since January she has been having other health problems mainly issues with her stomach where she has to go back and forth to the bathroom at night and she does not put it back on. She tried the doctors recommendations, she is  still having the same problems. It did not help so she is returning her machine to [facility].

## 2022-04-01 NOTE — Addendum Note (Signed)
Addended by: Bernestine Amass on: 04/01/2022 02:02 PM   Modules accepted: Orders

## 2022-04-02 DIAGNOSIS — M1712 Unilateral primary osteoarthritis, left knee: Secondary | ICD-10-CM | POA: Diagnosis not present

## 2022-04-03 ENCOUNTER — Telehealth: Payer: Self-pay

## 2022-04-03 NOTE — Telephone Encounter (Signed)
..     Pre-operative Risk Assessment    Patient Name: Angela [name]  DOB: Mar 16, 1946 MRN: [id number]      Request for Surgical Clearance    Procedure:   LEFT TOTAL KNEE REPLACEMENT  Date of Surgery:  Clearance TBD                                 Surgeon:  DR Edmonia Lynch Surgeon's Group or Practice Name:  [facility] Phone number:  [phone] Fax number:  [phone]   Type of Clearance Requested:   - Medical  - Pharmacy:  Hold Aspirin     Type of Anesthesia:  Spinal   Additional requests/questions:    Valisa, Karpel   04/03/2022, 8:22 AM

## 2022-04-03 NOTE — Telephone Encounter (Signed)
   Patient Name: Angela [name]  DOB: Oct 01, 1945 MRN: [id number]  Primary Cardiologist: Loralie Champagne, MD  Chart reviewed as part of pre-operative protocol coverage. Given past medical history and time since last visit, based on ACC/AHA guidelines, and per Dr. Aundra Dubin, primary cardiologist, Ted Mcalpine is at acceptable risk for the planned procedure without further cardiovascular testing.   Regarding ASA therapy, we recommend continuation of ASA throughout the perioperative period.  However, if the surgeon feels that cessation of ASA is required in the perioperative period, it may be stopped 5-7 days prior to surgery with a plan to resume it as soon as felt to be feasible from a surgical standpoint in the post-operative period.  The patient was advised that if she develops new symptoms prior to surgery to contact our office to arrange for a follow-up visit, and she verbalized understanding.  I will route this recommendation to the requesting party via Epic fax function and remove from pre-op pool.  Please call with questions.  Lenna Sciara, NP 04/03/2022, 1:11 PM

## 2022-04-08 DIAGNOSIS — M961 Postlaminectomy syndrome, not elsewhere classified: Secondary | ICD-10-CM | POA: Diagnosis not present

## 2022-04-08 DIAGNOSIS — M5412 Radiculopathy, cervical region: Secondary | ICD-10-CM | POA: Diagnosis not present

## 2022-04-08 DIAGNOSIS — M4322 Fusion of spine, cervical region: Secondary | ICD-10-CM | POA: Diagnosis not present

## 2022-04-08 DIAGNOSIS — M5459 Other low back pain: Secondary | ICD-10-CM | POA: Diagnosis not present

## 2022-04-08 DIAGNOSIS — M797 Fibromyalgia: Secondary | ICD-10-CM | POA: Diagnosis not present

## 2022-04-08 DIAGNOSIS — Z79891 Long term (current) use of opiate analgesic: Secondary | ICD-10-CM | POA: Diagnosis not present

## 2022-04-08 DIAGNOSIS — M5030 Other cervical disc degeneration, unspecified cervical region: Secondary | ICD-10-CM | POA: Diagnosis not present

## 2022-04-08 DIAGNOSIS — M25562 Pain in left knee: Secondary | ICD-10-CM | POA: Diagnosis not present

## 2022-04-08 DIAGNOSIS — M25561 Pain in right knee: Secondary | ICD-10-CM | POA: Diagnosis not present

## 2022-04-08 DIAGNOSIS — M25519 Pain in unspecified shoulder: Secondary | ICD-10-CM | POA: Diagnosis not present

## 2022-04-08 DIAGNOSIS — G894 Chronic pain syndrome: Secondary | ICD-10-CM | POA: Diagnosis not present

## 2022-04-08 DIAGNOSIS — M546 Pain in thoracic spine: Secondary | ICD-10-CM | POA: Diagnosis not present

## 2022-05-06 DIAGNOSIS — R300 Dysuria: Secondary | ICD-10-CM | POA: Diagnosis not present

## 2022-05-06 DIAGNOSIS — Z01818 Encounter for other preprocedural examination: Secondary | ICD-10-CM | POA: Diagnosis not present

## 2022-05-06 DIAGNOSIS — E559 Vitamin D deficiency, unspecified: Secondary | ICD-10-CM | POA: Diagnosis not present

## 2022-05-06 DIAGNOSIS — G4733 Obstructive sleep apnea (adult) (pediatric): Secondary | ICD-10-CM | POA: Diagnosis not present

## 2022-05-06 DIAGNOSIS — R7303 Prediabetes: Secondary | ICD-10-CM | POA: Diagnosis not present

## 2022-05-06 DIAGNOSIS — I10 Essential (primary) hypertension: Secondary | ICD-10-CM | POA: Diagnosis not present

## 2022-05-06 DIAGNOSIS — I2510 Atherosclerotic heart disease of native coronary artery without angina pectoris: Secondary | ICD-10-CM | POA: Diagnosis not present

## 2022-05-06 DIAGNOSIS — M25569 Pain in unspecified knee: Secondary | ICD-10-CM | POA: Diagnosis not present

## 2022-05-07 DIAGNOSIS — M5412 Radiculopathy, cervical region: Secondary | ICD-10-CM | POA: Diagnosis not present

## 2022-05-07 DIAGNOSIS — M5459 Other low back pain: Secondary | ICD-10-CM | POA: Diagnosis not present

## 2022-05-07 DIAGNOSIS — M546 Pain in thoracic spine: Secondary | ICD-10-CM | POA: Diagnosis not present

## 2022-05-07 DIAGNOSIS — M5030 Other cervical disc degeneration, unspecified cervical region: Secondary | ICD-10-CM | POA: Diagnosis not present

## 2022-05-07 DIAGNOSIS — M25561 Pain in right knee: Secondary | ICD-10-CM | POA: Diagnosis not present

## 2022-05-07 DIAGNOSIS — G894 Chronic pain syndrome: Secondary | ICD-10-CM | POA: Diagnosis not present

## 2022-05-07 DIAGNOSIS — M25519 Pain in unspecified shoulder: Secondary | ICD-10-CM | POA: Diagnosis not present

## 2022-05-07 DIAGNOSIS — M797 Fibromyalgia: Secondary | ICD-10-CM | POA: Diagnosis not present

## 2022-05-07 DIAGNOSIS — Z79891 Long term (current) use of opiate analgesic: Secondary | ICD-10-CM | POA: Diagnosis not present

## 2022-05-07 DIAGNOSIS — M25562 Pain in left knee: Secondary | ICD-10-CM | POA: Diagnosis not present

## 2022-05-07 DIAGNOSIS — M4322 Fusion of spine, cervical region: Secondary | ICD-10-CM | POA: Diagnosis not present

## 2022-05-07 DIAGNOSIS — M961 Postlaminectomy syndrome, not elsewhere classified: Secondary | ICD-10-CM | POA: Diagnosis not present

## 2022-05-08 DIAGNOSIS — J387 Other diseases of larynx: Secondary | ICD-10-CM | POA: Diagnosis not present

## 2022-05-08 DIAGNOSIS — R490 Dysphonia: Secondary | ICD-10-CM | POA: Diagnosis not present

## 2022-06-03 NOTE — Progress Notes (Signed)
Sent message, via epic in basket, requesting orders in epic from surgeon.  

## 2022-06-04 NOTE — H&P (Signed)
KNEE ARTHROPLASTY ADMISSION H&P  Patient ID: Angela [name] MRN: [id number] DOB/AGE: 1945-10-05 77 y.o.  Chief Complaint: left knee pain.  Planned Procedure Date: 06/24/22 Medical Clearance by Dr. [name]   Cardiac Clearance by Dr. Loralie Champagne Pain management clearance by Legrand Como Roche PA-C   HPI: Angela [name] is a 77 y.o. female who presents for evaluation of OA LEFT KNEE. The patient has a history of pain and functional disability in the left knee due to arthritis and has failed non-surgical conservative treatments for greater than 12 weeks to include NSAID's and/or analgesics, corticosteriod injections, viscosupplementation injections, use of assistive devices, and activity modification.  Onset of symptoms was gradual, starting 5 years ago with gradually worsening course since that time. The patient noted no past surgery on the left knee.  Patient currently rates pain at 7 out of 10 with activity. Patient has night pain, worsening of pain with activity and weight bearing, and pain that interferes with activities of daily living.  Patient has evidence of subchondral sclerosis, periarticular osteophytes, and joint space narrowing by imaging studies.  There is no active infection.  Past Medical History:  Diagnosis Date   Anxiety    Cancer of sigmoid ([place])    CHF (congestive heart failure) (HCC)    Depression (emotion)    DJD (degenerative joint disease)    Fibromyalgia    GERD (gastroesophageal reflux disease)    Hemorrhoids    Hyperlipidemia    Hypertension    IBS (irritable bowel syndrome)    MRSA (methicillin resistant Staphylococcus aureus) septicemia (HCC)    history of   OSA on CPAP    Over weight    Pancreatitis    RLS (restless legs syndrome)    Tachycardia    Past Surgical History:  Procedure Laterality Date   CARDIAC CATHETERIZATION [name] 12/26/2015   Procedure: Left Heart Cath and Coronary Angiography;  Surgeon: Troy Sine, MD;  Location: [facility];  Service: Cardiovascular;  Laterality: [name];   child birth     CORONARY STENT INTERVENTION [name] 10/28/2019   Procedure: CORONARY STENT INTERVENTION;  Surgeon: Burnell Blanks, MD;  Location: [facility];  Service: Cardiovascular;  Laterality: [name];   LEFT HEART CATH AND CORONARY ANGIOGRAPHY [name] 10/28/2019   Procedure: LEFT HEART CATH AND CORONARY ANGIOGRAPHY;  Surgeon: Burnell Blanks, MD;  Location: [facility];  Service: Cardiovascular;  Laterality: [name];   RIGHT/LEFT HEART CATH AND CORONARY ANGIOGRAPHY [name] 02/07/2022   Procedure: RIGHT/LEFT HEART CATH AND CORONARY ANGIOGRAPHY;  Surgeon: Larey Dresser, MD;  Location: [facility];  Service: Cardiovascular;  Laterality: [name];   Allergies  Allergen Reactions   Phenergan [Promethazine] Other (See Comments)    Restlessness    Timentin [Ticarcillin-Pot Clavulanate] Anaphylaxis    Has patient had a PCN reaction causing immediate rash, facial/tongue/throat swelling, SOB or lightheadedness with hypotension: Yes Has patient had a PCN reaction causing severe rash involving mucus membranes or skin necrosis: Yes Has patient had a PCN reaction that required hospitalization Yes Has patient had a PCN reaction occurring within the last 10 years: No If all of the above answers are "NO", then may proceed with Cephalosporin use.    Pregabalin Other (See Comments)    Mouth sores   Norvasc [Amlodipine] Swelling   Reglan [Metoclopramide] Other (See Comments)    Cannot sit still   Prior to Admission medications   Medication Sig Start Date End Date Taking? Authorizing Provider  aspirin EC 81 MG tablet Take 81 mg by mouth daily.    [provider]  Cyanocobalamin (B-12) 2500 MCG TABS Take 2,500 mcg by mouth daily.    [provider]  diclofenac Sodium (VOLTAREN) 1 % GEL Apply 1 Application topically 4 (four) times daily as needed (pain).    [provider]  DULoxetine (CYMBALTA) 20 MG  capsule Take 20 mg by mouth 2 (two) times daily.  03/26/17   [provider]  ENTRESTO 97-103 MG TAKE 1 TABLET TWICE A DAY 06/06/21   Larey Dresser, MD  escitalopram (LEXAPRO) 10 MG tablet Take 10 mg by mouth every evening. 11/18/18   [provider]  esomeprazole (NEXIUM) 40 MG capsule Take 40 mg by mouth daily.    [provider]  Evolocumab (REPATHA SURECLICK) 706 MG/ML SOAJ Inject 1 pen into the skin every 14 (fourteen) days. 04/24/21   Larey Dresser, MD  fluticasone [facility]) 50 MCG/ACT nasal spray Place 2 sprays into both nostrils daily as needed for allergies (seasonal allergies).     [provider]  furosemide (LASIX) 20 MG tablet Take 1 tablet (20 mg total) by mouth as needed for fluid or edema. 09/18/21 02/01/23  Larey Dresser, MD  hydrochlorothiazide (HYDRODIURIL) 25 MG tablet Take 1 tablet (25 mg total) by mouth daily. Patient not taking: Reported on 02/25/2022 07/08/21   Larey Dresser, MD  hydrOXYzine (ATARAX) 50 MG tablet SMARTSIG:0.5-1 Tablet(s) By Mouth PRN 02/14/22   [provider]  ibuprofen (ADVIL) 200 MG tablet Take 400-600 mg by mouth every 6 (six) hours as needed for moderate pain.    [provider]  isosorbide mononitrate (IMDUR) 60 MG 24 hr tablet Take 1.5 tablets (90 mg total) by mouth daily. 02/07/22   Larey Dresser, MD  levorphanol (LEVODROMORAN) 2 MG tablet Take 2 mg by mouth 3 (three) times daily.    [provider]  metoprolol succinate (TOPROL-XL) 100 MG 24 hr tablet TAKE 1 TABLET TWICE A DAY,TAKE WITH OR IMMEDIATELY FOLLOWING A MEAL (DISCONTINUE METOPROLOL TARTRATE) 04/24/21   Larey Dresser, MD  nitroGLYCERIN (NITROSTAT) 0.4 MG SL tablet DISSOLVE 1 TABLET UNDER THE TONGUE EVERY 5 MINUTES AS NEEDED FOR CHEST PAIN, AS DIRECTED. 10/17/21   Larey Dresser, MD  ondansetron (ZOFRAN) 4 MG tablet Take 4 mg by mouth every 8 (eight) hours as needed for nausea or vomiting.    [provider]   ondansetron (ZOFRAN-ODT) 4 MG disintegrating tablet Take 4 mg by mouth every 8 (eight) hours as needed for nausea/vomiting. 10/22/21   [provider]  rOPINIRole (REQUIP) 1 MG tablet Take 1-2 mg by mouth See admin instructions. Take 2 tablets (2 mg) by mouth daily at bedtime, may also take 1 tablet (1 mg) in the afternoon 15:00 as needed for restless legs    [provider]  rosuvastatin (CRESTOR) 10 MG tablet Take 1 tablet (10 mg total) by mouth at bedtime. 09/02/21   Sueanne Margarita, MD  spironolactone (ALDACTONE) 25 MG tablet TAKE 1 TABLET DAILY 09/02/21   Larey Dresser, MD  tiZANidine (ZANAFLEX) 4 MG tablet Take 4 mg by mouth every 8 (eight) hours as needed for muscle spasms.  04/21/17   [provider]   Social History   Socioeconomic History   Marital status: Married    Spouse name: Not on file   Number of children: Not on file   Years of education: Not on file   Highest education  level: Not on file  Occupational History   Not on file  Tobacco Use   Smoking status: Never   Smokeless tobacco: Never  Substance and Sexual Activity   Alcohol use: No    Alcohol/week: 0.0 standard drinks of alcohol   Drug use: No   Sexual activity: Not on file  Other Topics Concern   Not on file  Social History Narrative   Not on file   Social Determinants of Health   Financial Resource Strain: Not on file  Food Insecurity: Not on file  Transportation Needs: Not on file  Physical Activity: Not on file  Stress: Not on file  Social Connections: Not on file   Family History  Problem Relation Age of Onset   Hypertension Mother    Heart disease Mother    Heart attack Father    Stroke Father    Diabetes Sister    Hypertension Sister    Stroke Other    Diabetes Other    Heart disease Other     ROS: Currently denies lightheadedness, dizziness, Fever, chills, CP, SOB.   No personal history of DVT, PE, MI, or CVA. + h/o cardiac stent placed 2022 No loose teeth  or dentures All other systems have been reviewed and were otherwise currently negative with the exception of those mentioned in the HPI and as above.  Objective: Vitals: Ht: 5'7" Wt: 175.6 lbs Temp: 97.6 BP: 120/80 Pulse: 62 O2 97% on room air.   Physical Exam: General: Alert, NAD.  Antalgic Gait  HEENT: EOMI, Good Neck Extension  Pulm: No increased work of breathing.  Clear B/L A/P w/o crackle or wheeze.  CV: RRR, No m/g/r appreciated  GI: soft, NT, ND. BS x 4 quadrants Neuro: CN II-XII grossly intact without focal deficit.  Sensation intact distally Skin: No lesions in the area of chief complaint MSK/Surgical Site:   + JLT. ROM full but slow.  Decreased strength in extension and flexion.  +EHL/FHL.  NVI.  Stable varus and valgus stress.    Imaging Review Plain radiographs demonstrate severe degenerative joint disease of the left knee.   The overall alignment ismild varus. The bone quality appears to be fair for age and reported activity level.  Preoperative templating of the joint replacement has been completed, documented, and submitted to the Operating Room personnel in order to optimize intra-operative equipment management.  Assessment: OA LEFT KNEE Active Problems:   * No active hospital problems. *   Plan: Plan for Procedure(s): TOTAL KNEE ARTHROPLASTY  The patient history, physical exam, clinical judgement of the provider and imaging are consistent with end stage degenerative joint disease and total joint arthroplasty is deemed medically necessary. The treatment options including medical management, injection therapy, and arthroplasty were discussed at length. The risks and benefits of Procedure(s): TOTAL KNEE ARTHROPLASTY were presented and reviewed.  The risks of nonoperative treatment, versus surgical intervention including but not limited to continued pain, aseptic loosening, stiffness, dislocation/subluxation, infection, bleeding, nerve injury, blood clots,  cardiopulmonary complications, morbidity, mortality, among others were discussed. The patient verbalizes understanding and wishes to proceed with the plan.  Patient is being admitted for inpatient treatment for surgery, pain control, PT, prophylactic antibiotics, VTE prophylaxis, progressive ambulation, ADL's and discharge planning. She will spend the night in observation.  Dental prophylaxis discussed and recommended for 2 years postoperatively.  The patient does meet the criteria for TXA which will be used perioperatively.   ASA 81 mg BID  will be used postoperatively for DVT  prophylaxis in addition to SCDs, and early ambulation. Plan for Tylenol, Celebrex, oxycodone for pain.   Robaxin for muscle spasms.   Zofran for nausea and vomiting. Colace for constipation prevention [facility] [address] in [place] The patient is planning to be discharged home with OPPT and into the care of her husband Patrick Jupiter who can be reached at [phone] and her daughter Vania Rea who can be reached at [phone] Follow up appt 07/09/21 at 4:15pm     Alisa Graff Office [phone] 06/04/2022 6:19 PM

## 2022-06-05 ENCOUNTER — Other Ambulatory Visit (HOSPITAL_COMMUNITY): Payer: Self-pay | Admitting: Cardiology

## 2022-06-08 NOTE — Patient Instructions (Signed)
SURGICAL WAITING ROOM VISITATION Patients having surgery or a procedure may have no more than 2 support people in the waiting area - these visitors may rotate in the visitor waiting room.   Due to an increase in RSV and influenza rates and associated hospitalizations, children ages 40 and under may not visit patients in [place]. If the patient needs to stay at the hospital during part of their recovery, the visitor guidelines for inpatient rooms apply.  PRE-OP VISITATION  Pre-op nurse will coordinate an appropriate time for 1 support person to accompany the patient in pre-op.  This support person may not rotate.  This visitor will be contacted when the time is appropriate for the visitor to come back in the pre-op area.  Please refer to the [facility] website for the visitor guidelines for Inpatients (after your surgery is over and you are in a regular room).  You are not required to quarantine at this time prior to your surgery. However, you must do this: Hand Hygiene often Do NOT share personal items Notify your provider if you are in close contact with someone who has COVID or you develop fever 100.4 or greater, new onset of sneezing, cough, sore throat, shortness of breath or body aches.  If you test positive for Covid or have been in contact with anyone that has tested positive in the last 10 days please notify you surgeon.    Your procedure is scheduled on:  Tuesday  June 24, 2022  Report to [facility] Main Entrance: [place] entrance where the [employer] is available.   Report to admitting at: 10:00 AM  +++++Call this number if you have any questions or problems the morning of surgery [phone]  Do not eat food after Midnight the night prior to your surgery/procedure.  After Midnight you may have the following liquids until  09:30 AM DAY OF SURGERY  Clear Liquid Diet Water Black Coffee (sugar ok, NO MILK/CREAM OR CREAMERS)  Tea (sugar ok, NO  MILK/CREAM OR CREAMERS) regular and decaf                             Plain Jell-O  with no fruit (NO RED)                                           Fruit ices (not with fruit pulp, NO RED)                                     Popsicles (NO RED)                                                                  Juice: apple, WHITE grape, WHITE cranberry Sports drinks like Gatorade or Powerade (NO RED)                    The day of surgery:  Drink ONE (1) Pre-Surgery G2 at   09:30   AM the morning of surgery. Drink in one sitting.  Do not sip.  This drink was given to you during your hospital pre-op appointment visit. Nothing else to drink after completing the Pre-Surgery G2 : No candy, chewing gum or throat lozenges.    FOLLOW  ANY ADDITIONAL PRE OP INSTRUCTIONS YOU RECEIVED FROM YOUR SURGEON'S OFFICE!!!   Oral Hygiene is also important to reduce your risk of infection.        Remember - BRUSH YOUR TEETH THE MORNING OF SURGERY WITH YOUR REGULAR TOOTHPASTE  Take ONLY these medicines the morning of surgery with A SIP OF WATER: Levorphanol (Levodromoran), Duloxetine (Cymbalta), metoprolol, esomeprazole (Nexium). You may use your Flonase nasal spray if needed.   You may not have any metal on your body including hair pins, jewelry, and body piercing  Do not wear make-up, lotions, powders, perfumes  or deodorant  Do not wear nail polish including gel and S&S, artificial / acrylic nails, or any other type of covering on natural nails including finger and toenails. If you have artificial nails, gel coating, etc., that needs to be removed by a nail salon, Please have this removed prior to surgery. Not doing so may mean that your surgery could be cancelled or delayed if the Surgeon or anesthesia staff feels like they are unable to monitor you safely.   Do not shave 48 hours prior to surgery to avoid nicks in your skin which may contribute to postoperative infections.   You may bring a small  overnight bag with you on the day of surgery, only pack items that are not valuable. [facility] IS NOT RESPONSIBLE   FOR VALUABLES THAT ARE LOST OR STOLEN.    Do not bring your home medications to the hospital. The Pharmacy will dispense medications listed on your medication list to you during your admission in the Hospital.  Special Instructions: Bring a copy of your healthcare power of attorney and living will documents the day of surgery, if you wish to have them scanned into your [facility] Records- EPIC  Please read over the following fact sheets you were given: IF YOU HAVE QUESTIONS ABOUT YOUR PRE-OP INSTRUCTIONS, PLEASE CALL [phone]  ([place])   [facility] - Preparing for Surgery Before surgery, you can play an important role.  Because skin is not sterile, your skin needs to be as free of germs as possible.  You can reduce the number of germs on your skin by washing with CHG (chlorahexidine gluconate) soap before surgery.  CHG is an antiseptic cleaner which kills germs and bonds with the skin to continue killing germs even after washing. Please DO NOT use if you have an allergy to CHG or antibacterial soaps.  If your skin becomes reddened/irritated stop using the CHG and inform your nurse when you arrive at Short Stay. Do not shave (including legs and underarms) for at least 48 hours prior to the first CHG shower.  You may shave your face/neck.  Please follow these instructions carefully:  1.  Shower with CHG Soap the night before surgery and the  morning of surgery.  2.  If you choose to wash your hair, wash your hair first as usual with your normal  shampoo.  3.  After you shampoo, rinse your hair and body thoroughly to remove the shampoo.                             4.  Use CHG as you would any other liquid soap.  You  can apply chg directly to the skin and wash.  Gently with a scrungie or clean washcloth.  5.  Apply the CHG Soap to your body ONLY FROM THE NECK DOWN.   Do not  use on face/ open                           Wound or open sores. Avoid contact with eyes, ears mouth and genitals (private parts).                       Wash face,  Genitals (private parts) with your normal soap.             6.  Wash thoroughly, paying special attention to the area where your  surgery  will be performed.  7.  Thoroughly rinse your body with warm water from the neck down.  8.  DO NOT shower/wash with your normal soap after using and rinsing off the CHG Soap.            9.  Pat yourself dry with a clean towel.            10.  Wear clean pajamas.            11.  Place clean sheets on your bed the night of your first shower and do not  sleep with pets.  ON THE DAY OF SURGERY : Do not apply any lotions/deodorants the morning of surgery.  Please wear clean clothes to the hospital/surgery center.    FAILURE TO FOLLOW THESE INSTRUCTIONS MAY RESULT IN THE CANCELLATION OF YOUR SURGERY  PATIENT SIGNATURE_________________________________  NURSE SIGNATURE__________________________________  ________________________________________________________________________      Adam Phenix    An incentive spirometer is a tool that can help keep your lungs clear and active. This tool measures how well you are filling your lungs with each breath. Taking long deep breaths may help reverse or decrease the chance of developing breathing (pulmonary) problems (especially infection) following: A long period of time when you are unable to move or be active. BEFORE THE PROCEDURE  If the spirometer includes an indicator to show your best effort, your nurse or respiratory therapist will set it to a desired goal. If possible, sit up straight or lean slightly forward. Try not to slouch. Hold the incentive spirometer in an upright position. INSTRUCTIONS FOR USE  Sit on the edge of your bed if possible, or sit up as far as you can in bed or on a chair. Hold the incentive spirometer in an  upright position. Breathe out normally. Place the mouthpiece in your mouth and seal your lips tightly around it. Breathe in slowly and as deeply as possible, raising the piston or the ball toward the top of the column. Hold your breath for 3-5 seconds or for as long as possible. Allow the piston or ball to fall to the bottom of the column. Remove the mouthpiece from your mouth and breathe out normally. Rest for a few seconds and repeat Steps 1 through 7 at least 10 times every 1-2 hours when you are awake. Take your time and take a few normal breaths between deep breaths. The spirometer may include an indicator to show your best effort. Use the indicator as a goal to work toward during each repetition. After each set of 10 deep breaths, practice coughing to be sure your lungs are clear. If you have an incision (the cut made at  the time of surgery), support your incision when coughing by placing a pillow or rolled up towels firmly against it. Once you are able to get out of bed, walk around indoors and cough well. You may stop using the incentive spirometer when instructed by your caregiver.  RISKS AND COMPLICATIONS Take your time so you do not get dizzy or light-headed. If you are in pain, you may need to take or ask for pain medication before doing incentive spirometry. It is harder to take a deep breath if you are having pain. AFTER USE Rest and breathe slowly and easily. It can be helpful to keep track of a log of your progress. Your caregiver can provide you with a simple table to help with this. If you are using the spirometer at home, follow these instructions: [place] IF:  You are having difficultly using the spirometer. You have trouble using the spirometer as often as instructed. Your pain medication is not giving enough relief while using the spirometer. You develop fever of 100.5 F (38.1 C) or higher.                                                                                                     SEEK IMMEDIATE MEDICAL CARE IF:  You cough up bloody sputum that had not been present before. You develop fever of 102 F (38.9 C) or greater. You develop worsening pain at or near the incision site. MAKE SURE YOU:  Understand these instructions. Will watch your condition. Will get help right away if you are not doing well or get worse. Document Released: 09/29/2006 Document Revised: 08/11/2011 Document Reviewed: 11/30/2006 [facility] Patient Information 2014 [place], [place].

## 2022-06-08 NOTE — Progress Notes (Signed)
COVID Vaccine received:  _0  No _1  Yes Date of any COVID positive Test in last 90 days:  PCP - [name], MD   Medical clearance on chart Cardiologist - Loralie Champagne, MD Diona Browner, NP- Cardiac Clearance in Telephone note 04-03-22  [facility]- Fransico Him, MD Neurosurgery- Emelia Loron, MD  [address]  [place], [place] [address]  [phone]  [phone] (Fax)   Chest x-ray - 05-29-2017  Epic EKG -  01-31-2022  Epic Stress Test - 08-20-2020  Epic ECHO - 08-05-2021 Epic Cardiac Cath - Caths x 3, Last was [facility]  02-07-2022  Dr. Deatra Robinson,  has DES Long Term monitor:  02-26-2022  PCR screen: _2  Ordered & Completed                      _3   No Order but Needs PROFEND                      _4   [name] for this surgery  Surgery Plan:  _5  Ambulatory                            _6  Outpatient in bed                            _7  Admit  Anesthesia:    _8  General  _9  Spinal                           _10   Choice _11   MAC  Pacemaker / ICD device _12  No _13  Yes        Device order form faxed _14  No    _15   Yes      Faxed to:  Spinal Cord Stimulator:_16  No _17  Yes      (Remind patient to bring remote DOS) Other Implants:   History of Sleep Apnea? _18  No _19  Yes   CPAP used?- _20  No _21  Yes    PRE-DM   Last A1c on 05-06-2022 ? 6.1 Does the patient monitor blood sugar? _22  No _23  Yes  _24  [name]  Blood Thinner / Instructions:none Aspirin Instructions:ASA 81 mg   Hold 5-7 days OK per Diona Browner, NP Telephone note   ERAS Protocol Ordered: _25  No  _26  Yes PRE-SURGERY _27  ENSURE  _28  G2  Patient is to be NPO after:  09:30 am   Comments:   Activity level: Patient can / can not climb a flight of stairs without difficulty; _29  No CP  _30  No SOB, but would have ______   Patient can / can not perform ADLs without assistance.   Anesthesia review: OSA, CAD, CHF, Pre-DM, CKD3, HTN, s/p ACDF 11-16-2016 (C3-5)  Patient denies shortness of breath, fever, cough and chest pain  at PAT appointment.  Patient verbalized understanding and agreement to the Pre-Surgical Instructions that were given to them at this PAT appointment. Patient was also educated of the need to review these PAT instructions again prior to his/her surgery.I reviewed the appropriate phone numbers to call if they have any and questions or concerns.

## 2022-06-09 NOTE — Care Plan (Signed)
Ortho Bundle Case Management Note  Patient Details  Name: Angela [name] MRN: [id number] Date of Birth: 08/20/1945  met with patient in the office for H&P. she will discharge to home with family to assist. has rolling walker. CPM ordered from [facility]. discharge instructions discussed and questions answered. appointments confirmed. Patient and MD in agreement. Choice offered                   DME Arranged:  [facility] DME Agency:  [employer]  HH Arranged:    [place] Agency:     Additional Comments: Please contact me with any questions of if this plan should need to change.  Ladell Heads,  [facility]  [phone] 06/09/2022, 12:27 PM

## 2022-06-10 ENCOUNTER — Encounter (HOSPITAL_COMMUNITY)
Admission: RE | Admit: 2022-06-10 | Discharge: 2022-06-10 | Disposition: A | Discharge status: Home / Self Care | Payer: Medicare Other | Source: Ambulatory Visit | Attending: Orthopedic Surgery | Admitting: Orthopedic Surgery

## 2022-06-10 ENCOUNTER — Other Ambulatory Visit: Payer: Self-pay

## 2022-06-10 ENCOUNTER — Encounter (HOSPITAL_COMMUNITY): Payer: Self-pay

## 2022-06-10 VITALS — BP 105/65 | HR 62 | Temp 98.0°F | Resp 16 | Ht 66.0 in | Wt 175.0 lb

## 2022-06-10 DIAGNOSIS — I110 Hypertensive heart disease with heart failure: Secondary | ICD-10-CM | POA: Insufficient documentation

## 2022-06-10 DIAGNOSIS — I2510 Atherosclerotic heart disease of native coronary artery without angina pectoris: Secondary | ICD-10-CM | POA: Insufficient documentation

## 2022-06-10 DIAGNOSIS — G4733 Obstructive sleep apnea (adult) (pediatric): Secondary | ICD-10-CM | POA: Insufficient documentation

## 2022-06-10 DIAGNOSIS — R7303 Prediabetes: Secondary | ICD-10-CM

## 2022-06-10 DIAGNOSIS — I509 Heart failure, unspecified: Secondary | ICD-10-CM | POA: Insufficient documentation

## 2022-06-10 DIAGNOSIS — Z01812 Encounter for preprocedural laboratory examination: Secondary | ICD-10-CM | POA: Insufficient documentation

## 2022-06-10 DIAGNOSIS — Z01818 Encounter for other preprocedural examination: Secondary | ICD-10-CM

## 2022-06-10 DIAGNOSIS — M1712 Unilateral primary osteoarthritis, left knee: Secondary | ICD-10-CM | POA: Insufficient documentation

## 2022-06-10 HISTORY — DX: Pneumonia, unspecified organism: J18.9

## 2022-06-10 HISTORY — DX: Nausea with vomiting, unspecified: R11.2

## 2022-06-10 HISTORY — DX: Other specified postprocedural states: Z98.890

## 2022-06-10 LAB — BASIC METABOLIC PANEL
Anion gap: 9 (ref 5–15)
BUN: 19 mg/dL (ref 8–23)
CO2: 26 mmol/L (ref 22–32)
Calcium: 8.6 mg/dL — ABNORMAL LOW (ref 8.9–10.3)
Chloride: 106 mmol/L (ref 98–111)
Creatinine, Ser: 1.22 mg/dL — ABNORMAL HIGH (ref 0.44–1.00)
GFR, Estimated: 46 mL/min — ABNORMAL LOW (ref >60)
Glucose, Bld: 116 mg/dL — ABNORMAL HIGH (ref 70–99)
Potassium: 4.7 mmol/L (ref 3.5–5.1)
Sodium: 141 mmol/L (ref 135–145)

## 2022-06-10 LAB — CBC
HCT: 41.1 % (ref 36.0–46.0)
Hemoglobin: 13.6 g/dL (ref 12.0–15.0)
MCH: 30.4 pg (ref 26.0–34.0)
MCHC: 33.1 g/dL (ref 30.0–36.0)
MCV: 91.7 fL (ref 80.0–100.0)
Platelets: 275 10*3/uL (ref 150–400)
RBC: 4.48 MIL/uL (ref 3.87–5.11)
RDW: 13.7 % (ref 11.5–15.5)
WBC: 8.1 10*3/uL (ref 4.0–10.5)
nRBC: 0 % (ref 0.0–0.2)

## 2022-06-10 LAB — GLUCOSE, CAPILLARY: Glucose-Capillary: 117 mg/dL — ABNORMAL HIGH (ref 70–99)

## 2022-06-10 LAB — SURGICAL PCR SCREEN
MRSA, PCR: NEGATIVE
Staphylococcus aureus: NEGATIVE

## 2022-06-11 ENCOUNTER — Encounter (HOSPITAL_COMMUNITY): Payer: Medicare Other

## 2022-06-11 NOTE — Progress Notes (Signed)
Anesthesia Chart Review   Case: [id number] Date/Time: 06/24/22 1222   Procedure: TOTAL KNEE ARTHROPLASTY (Left: Knee)   Anesthesia type: Spinal   Pre-op diagnosis: OA LEFT KNEE   Location: [name]   Surgeons: Angela Butters, MD       DISCUSSION:77 y.o. never smoker with h/o PONV, HTN, CHF, OSA, left knee OA scheduled for above procedure 06/24/2022 with Dr. Edmonia [name].   Per cardiology preoperative evaluation 04/03/2022, "Chart reviewed as part of pre-operative protocol coverage. Given past medical history and time since last visit, based on ACC/AHA guidelines, and per Angela [name], primary cardiologist, Angela [name] is at acceptable risk for the planned procedure without further cardiovascular testing.    Regarding ASA therapy, we recommend continuation of ASA throughout the perioperative period.  However, if the surgeon feels that cessation of ASA is required in the perioperative period, it may be stopped 5-7 days prior to surgery with a plan to resume it as soon as felt to be feasible from a surgical standpoint in the post-operative period."  Anticipate pt can proceed with planned procedure barring acute status change.   VS: BP 105/65 Comment: Right arm sitting  Pulse 62   Temp 36.7 C (Oral)   Resp 16   Ht '5\' 6"'$  (1.676 m)   Wt 79.4 kg   SpO2 98%   BMI 28.25 kg/m   PROVIDERS: Angela Pepper, MD is PCP   Cardiologist - Angela Champagne, MD  LABS: Labs reviewed: Acceptable for surgery. (all labs ordered are listed, but only abnormal results are displayed)  Labs Reviewed  BASIC METABOLIC PANEL - Abnormal; Notable for the following components:      Result Value   Glucose, Bld 116 (*)    Creatinine, Ser 1.22 (*)    Calcium 8.6 (*)    GFR, Estimated 46 (*)    All other components within normal limits  GLUCOSE, CAPILLARY - Abnormal; Notable for the following components:   Glucose-Capillary 117 (*)    All other components within normal limits  SURGICAL PCR  SCREEN  CBC     IMAGES:   EKG:   CV: Cardiac Cath 02/07/2022   Mid Cx to Dist Cx lesion is 30% stenosed.   Prox LAD lesion is 50% stenosed.   1st Diag lesion is 80% stenosed.   Mid LAD lesion is 50% stenosed.   Previously placed Prox RCA to Mid RCA stent of unknown type is  widely patent.   1. Near normal filling pressures.  2. Patent RCA stent.  3. Serial 50% proximal LAD stenoses.  4. 80% ostial stenosis of a small D1.     No change from prior study in the LAD/D1, does not appear to have interventional target. Would treat D1 medically.   Echo 08/05/2021 1. Left ventricular ejection fraction by 3D volume is 63 %. The left  ventricle has normal function. The left ventricle has no regional wall  motion abnormalities. There is mild concentric left ventricular  hypertrophy. Left ventricular diastolic  parameters were normal.   2. Right ventricular systolic function is normal. The right ventricular  size is normal. There is normal pulmonary artery systolic pressure.   3. The mitral valve is normal in structure. Mild to moderate mitral valve  regurgitation. No evidence of mitral stenosis.   4. Tricuspid valve regurgitation is mild to moderate.   5. The aortic valve is tricuspid. Aortic valve regurgitation is not  visualized. Aortic valve sclerosis/calcification is present, without any  evidence of aortic stenosis.   6. There is borderline dilatation of the ascending aorta, measuring 37  mm.   7. The inferior vena cava is normal in size with greater than 50%  respiratory variability, suggesting right atrial pressure of 3 mmHg.   Myocardial Perfusion 08/20/2020 The left ventricular ejection fraction is hyperdynamic (>65%). Nuclear stress EF: 71%. There was no ST segment deviation noted during stress. This is a low risk study.   1. EF 71%, normal wall motion.  2. Small, partially reversible mid anterior perfusion defect.  Possible shifting breast artifact, but cannot rule out a  small area of ischemia.    Overall, low risk study.  Past Medical History:  Diagnosis Date   Anxiety    Cancer of sigmoid ([place])    CHF (congestive heart failure) (H[facility])    Depression (emotion)    DJD (degenerative joint disease)    Fibromyalgia    GERD (gastroesophageal reflux disease)    Hemorrhoids    Hyperlipidemia    Hypertension    IBS (irritable bowel syndrome)    MRSA (methicillin resistant Staphylococcus aureus) septicemia (HCC)    history of   OSA    stopped CPAP, wants to have ASPIRE   Over weight    Pancreatitis    Pneumonia    PONV (postoperative nausea and vomiting)    RLS (restless legs syndrome)    Tachycardia     Past Surgical History:  Procedure Laterality Date   ABDOMINAL HYSTERECTOMY     APPENDECTOMY     CARDIAC CATHETERIZATION [name] 12/26/2015   Procedure: Left Heart Cath and Coronary Angiography;  Surgeon: Troy Sine, MD;  Location: [facility];  Service: Cardiovascular;  Laterality: [name];   CERVICAL SPINE SURGERY     has had 5 different surgeries including ACDF  C3-5   child birth     CHOLECYSTECTOMY     COLON SURGERY     resection due to colon Ca, no ostomy   CORONARY STENT INTERVENTION [name] 10/28/2019   Procedure: CORONARY STENT INTERVENTION;  Surgeon: Burnell Blanks, MD;  Location: [facility];  Service: Cardiovascular;  Laterality: [name];   EYE SURGERY Bilateral    cataracts removed, lens implanted   JOINT REPLACEMENT Right 2012   by Dr. Noemi Chapel   LEFT HEART CATH AND CORONARY ANGIOGRAPHY [name] 10/28/2019   Procedure: LEFT HEART CATH AND CORONARY ANGIOGRAPHY;  Surgeon: Burnell Blanks, MD;  Location: [facility];  Service: Cardiovascular;  Laterality: [name];   LEFT OOPHORECTOMY     for torsion   RIGHT/LEFT HEART CATH AND CORONARY ANGIOGRAPHY [name] 02/07/2022   Procedure: RIGHT/LEFT HEART CATH AND CORONARY ANGIOGRAPHY;  Surgeon: Larey Dresser, MD;  Location: [facility];  Service: Cardiovascular;   Laterality: [name];   TONSILLECTOMY     TUBAL LIGATION      MEDICATIONS:  aspirin EC 81 MG tablet   cetirizine (ZYRTEC) 10 MG tablet   Cyanocobalamin (B-12) 2500 MCG TABS   DULoxetine (CYMBALTA) 20 MG capsule   ENTRESTO 97-103 MG   escitalopram (LEXAPRO) 10 MG tablet   esomeprazole (NEXIUM) 40 MG capsule   fluticasone (FLONASE) 50 MCG/ACT nasal spray   hydrOXYzine (ATARAX) 50 MG tablet   ibuprofen (ADVIL) 200 MG tablet   isosorbide mononitrate (IMDUR) 60 MG 24 hr tablet   levorphanol (LEVODROMORAN) 2 MG tablet   metoprolol succinate (TOPROL-XL) 100 MG 24 hr tablet   nitroGLYCERIN (NITROSTAT) 0.4 MG SL tablet   ondansetron (ZOFRAN)  4 MG tablet   REPATHA SURECLICK 289 MG/ML SOAJ   rOPINIRole (REQUIP) 1 MG tablet   rosuvastatin (CRESTOR) 10 MG tablet   spironolactone (ALDACTONE) 25 MG tablet   tiZANidine (ZANAFLEX) 4 MG tablet   VITAMIN D PO   No current facility-administered medications for this encounter.    Konrad Felix Ward, PA-C [facility] [phone]

## 2022-06-12 ENCOUNTER — Encounter (HOSPITAL_COMMUNITY): Payer: Medicare Other

## 2022-06-24 ENCOUNTER — Ambulatory Visit (HOSPITAL_COMMUNITY): Payer: Medicare Other | Admitting: Physician Assistant

## 2022-06-24 ENCOUNTER — Observation Stay (HOSPITAL_COMMUNITY)
Admission: RE | Admit: 2022-06-24 | Discharge: 2022-06-26 | Disposition: A | Discharge status: Home / Self Care | Payer: Medicare Other | Attending: Orthopedic Surgery | Admitting: Orthopedic Surgery

## 2022-06-24 ENCOUNTER — Ambulatory Visit (HOSPITAL_BASED_OUTPATIENT_CLINIC_OR_DEPARTMENT_OTHER): Payer: Medicare Other | Admitting: Certified Registered Nurse Anesthetist

## 2022-06-24 ENCOUNTER — Observation Stay (HOSPITAL_COMMUNITY): Payer: Medicare Other

## 2022-06-24 ENCOUNTER — Encounter (HOSPITAL_COMMUNITY): Payer: Self-pay | Admitting: Orthopedic Surgery

## 2022-06-24 ENCOUNTER — Encounter (HOSPITAL_COMMUNITY)
Admission: RE | Disposition: A | Discharge status: Home / Self Care | Payer: Self-pay | Source: Home / Self Care | Attending: Orthopedic Surgery

## 2022-06-24 ENCOUNTER — Other Ambulatory Visit: Payer: Self-pay

## 2022-06-24 DIAGNOSIS — I509 Heart failure, unspecified: Secondary | ICD-10-CM

## 2022-06-24 DIAGNOSIS — M1712 Unilateral primary osteoarthritis, left knee: Secondary | ICD-10-CM

## 2022-06-24 DIAGNOSIS — Z955 Presence of coronary angioplasty implant and graft: Secondary | ICD-10-CM | POA: Insufficient documentation

## 2022-06-24 DIAGNOSIS — I110 Hypertensive heart disease with heart failure: Secondary | ICD-10-CM | POA: Diagnosis not present

## 2022-06-24 DIAGNOSIS — Z79899 Other long term (current) drug therapy: Secondary | ICD-10-CM | POA: Diagnosis not present

## 2022-06-24 DIAGNOSIS — Z96652 Presence of left artificial knee joint: Secondary | ICD-10-CM

## 2022-06-24 DIAGNOSIS — Z7982 Long term (current) use of aspirin: Secondary | ICD-10-CM | POA: Diagnosis not present

## 2022-06-24 DIAGNOSIS — Z85048 Personal history of other malignant neoplasm of rectum, rectosigmoid junction, and anus: Secondary | ICD-10-CM | POA: Diagnosis not present

## 2022-06-24 DIAGNOSIS — I2510 Atherosclerotic heart disease of native coronary artery without angina pectoris: Secondary | ICD-10-CM | POA: Diagnosis not present

## 2022-06-24 DIAGNOSIS — R7303 Prediabetes: Secondary | ICD-10-CM

## 2022-06-24 HISTORY — PX: TOTAL KNEE ARTHROPLASTY: SHX125

## 2022-06-24 SURGERY — ARTHROPLASTY, KNEE, TOTAL
Anesthesia: Regional | Site: Knee | Laterality: Left

## 2022-06-24 MED ORDER — SODIUM CHLORIDE (PF) 0.9 % IJ SOLN
INTRAMUSCULAR | Status: AC
  Filled 2022-06-24: qty 30

## 2022-06-24 MED ORDER — VANCOMYCIN HCL IN DEXTROSE 1-5 GM/200ML-% IV SOLN
1000.0000 mg | Freq: Once | INTRAVENOUS | Status: AC
  Administered 2022-06-24: 1000 mg via INTRAVENOUS
  Filled 2022-06-24: qty 200

## 2022-06-24 MED ORDER — ACETAMINOPHEN 500 MG PO TABS
1000.0000 mg | ORAL_TABLET | Freq: Four times a day (QID) | ORAL | Status: AC
  Administered 2022-06-24 – 2022-06-25 (×4): 1000 mg via ORAL
  Filled 2022-06-24 (×4): qty 2

## 2022-06-24 MED ORDER — PHENOL 1.4 % MT LIQD: 1.0000 | OROMUCOSAL | Status: DC | PRN

## 2022-06-24 MED ORDER — MENTHOL 3 MG MT LOZG: 1.0000 | LOZENGE | OROMUCOSAL | Status: DC | PRN

## 2022-06-24 MED ORDER — PROPOFOL 500 MG/50ML IV EMUL
INTRAVENOUS | Status: DC | PRN
  Administered 2022-06-24: 75 ug/kg/min via INTRAVENOUS

## 2022-06-24 MED ORDER — TRANEXAMIC ACID-NACL 1000-0.7 MG/100ML-% IV SOLN
1000.0000 mg | INTRAVENOUS | Status: AC
  Administered 2022-06-24: 1000 mg via INTRAVENOUS
  Filled 2022-06-24: qty 100

## 2022-06-24 MED ORDER — BISACODYL 10 MG RE SUPP: 10.0000 mg | Freq: Every day | RECTAL | Status: DC | PRN

## 2022-06-24 MED ORDER — DEXAMETHASONE SODIUM PHOSPHATE 10 MG/ML IJ SOLN: 8.0000 mg | Freq: Once | INTRAMUSCULAR | Status: DC

## 2022-06-24 MED ORDER — VANCOMYCIN HCL IN DEXTROSE 1-5 GM/200ML-% IV SOLN
1000.0000 mg | Freq: Two times a day (BID) | INTRAVENOUS | Status: AC
  Administered 2022-06-24: 1000 mg via INTRAVENOUS
  Filled 2022-06-24: qty 200

## 2022-06-24 MED ORDER — ROPINIROLE HCL 1 MG PO TABS
1.0000 mg | ORAL_TABLET | Freq: Every day | ORAL | Status: DC
  Administered 2022-06-25: 1 mg via ORAL
  Filled 2022-06-24: qty 1

## 2022-06-24 MED ORDER — ONDANSETRON HCL 4 MG PO TABS
4.0000 mg | ORAL_TABLET | Freq: Four times a day (QID) | ORAL | Status: DC | PRN
  Administered 2022-06-26: 4 mg via ORAL
  Filled 2022-06-24: qty 1

## 2022-06-24 MED ORDER — ROPIVACAINE HCL 5 MG/ML IJ SOLN
INTRAMUSCULAR | Status: DC | PRN
  Administered 2022-06-24: 30 mL via PERINEURAL

## 2022-06-24 MED ORDER — OXYCODONE HCL 5 MG PO TABS
5.0000 mg | ORAL_TABLET | ORAL | Status: DC | PRN
  Administered 2022-06-25: 5 mg via ORAL
  Filled 2022-06-24 (×2): qty 1

## 2022-06-24 MED ORDER — PANTOPRAZOLE SODIUM 40 MG PO TBEC
40.0000 mg | DELAYED_RELEASE_TABLET | Freq: Every day | ORAL | Status: DC
  Administered 2022-06-24 – 2022-06-26 (×3): 40 mg via ORAL
  Filled 2022-06-24 (×3): qty 1

## 2022-06-24 MED ORDER — ROPINIROLE HCL 1 MG PO TABS: 1.0000 mg | ORAL_TABLET | ORAL | Status: DC

## 2022-06-24 MED ORDER — DEXAMETHASONE SODIUM PHOSPHATE 10 MG/ML IJ SOLN
INTRAMUSCULAR | Status: DC | PRN
  Administered 2022-06-24: 10 mg via INTRAVENOUS

## 2022-06-24 MED ORDER — ROPINIROLE HCL 1 MG PO TABS
2.0000 mg | ORAL_TABLET | Freq: Every day | ORAL | Status: DC
  Administered 2022-06-24 – 2022-06-25 (×2): 2 mg via ORAL
  Filled 2022-06-24 (×2): qty 2

## 2022-06-24 MED ORDER — ONDANSETRON HCL 4 MG/2ML IJ SOLN: 4.0000 mg | Freq: Once | INTRAMUSCULAR | Status: DC | PRN

## 2022-06-24 MED ORDER — SODIUM CHLORIDE 0.9 % IR SOLN
Status: DC | PRN
  Administered 2022-06-24: 1000 mL

## 2022-06-24 MED ORDER — DULOXETINE HCL 20 MG PO CPEP
20.0000 mg | ORAL_CAPSULE | Freq: Two times a day (BID) | ORAL | Status: DC
  Administered 2022-06-24 – 2022-06-26 (×4): 20 mg via ORAL
  Filled 2022-06-24 (×4): qty 1

## 2022-06-24 MED ORDER — PHENYLEPHRINE HCL-NACL 20-0.9 MG/250ML-% IV SOLN
INTRAVENOUS | Status: DC | PRN
  Administered 2022-06-24: 40 ug/min via INTRAVENOUS

## 2022-06-24 MED ORDER — POVIDONE-IODINE 10 % EX SWAB: 2.0000 | Freq: Once | CUTANEOUS | Status: DC

## 2022-06-24 MED ORDER — ISOSORBIDE MONONITRATE ER 60 MG PO TB24
90.0000 mg | ORAL_TABLET | Freq: Every day | ORAL | Status: DC
  Administered 2022-06-25 – 2022-06-26 (×2): 90 mg via ORAL
  Filled 2022-06-24 (×2): qty 1

## 2022-06-24 MED ORDER — SODIUM CHLORIDE 0.9 % IV SOLN
INTRAVENOUS | Status: DC | PRN
  Administered 2022-06-24: 80 mL

## 2022-06-24 MED ORDER — HYDROMORPHONE HCL 1 MG/ML IJ SOLN
0.5000 mg | INTRAMUSCULAR | Status: DC | PRN
  Administered 2022-06-25 – 2022-06-26 (×2): 1 mg via INTRAVENOUS
  Filled 2022-06-24 (×2): qty 1

## 2022-06-24 MED ORDER — METHOCARBAMOL 500 MG IVPB - SIMPLE MED: 500.0000 mg | Freq: Four times a day (QID) | INTRAVENOUS | Status: DC | PRN

## 2022-06-24 MED ORDER — 0.9 % SODIUM CHLORIDE (POUR BTL) OPTIME
TOPICAL | Status: DC | PRN
  Administered 2022-06-24: 1000 mL

## 2022-06-24 MED ORDER — ACETAMINOPHEN 325 MG PO TABS
325.0000 mg | ORAL_TABLET | Freq: Four times a day (QID) | ORAL | Status: DC | PRN
  Administered 2022-06-26: 650 mg via ORAL
  Filled 2022-06-24: qty 2

## 2022-06-24 MED ORDER — BUPIVACAINE HCL (PF) 0.25 % IJ SOLN
INTRAMUSCULAR | Status: AC
  Filled 2022-06-24: qty 30

## 2022-06-24 MED ORDER — OXYCODONE HCL 5 MG PO TABS
10.0000 mg | ORAL_TABLET | ORAL | Status: DC | PRN
  Administered 2022-06-24 – 2022-06-26 (×7): 10 mg via ORAL
  Filled 2022-06-24 (×7): qty 2

## 2022-06-24 MED ORDER — LEVORPHANOL TARTRATE 2 MG PO TABS: 2.0000 mg | ORAL_TABLET | Freq: Three times a day (TID) | ORAL | Status: DC

## 2022-06-24 MED ORDER — ORAL CARE MOUTH RINSE: 15.0000 mL | Freq: Once | OROMUCOSAL | Status: AC

## 2022-06-24 MED ORDER — DEXAMETHASONE SODIUM PHOSPHATE 10 MG/ML IJ SOLN
10.0000 mg | Freq: Once | INTRAMUSCULAR | Status: AC
  Administered 2022-06-25: 10 mg via INTRAVENOUS
  Filled 2022-06-24: qty 1

## 2022-06-24 MED ORDER — BUPIVACAINE IN DEXTROSE 0.75-8.25 % IT SOLN
INTRATHECAL | Status: DC | PRN
  Administered 2022-06-24: 1.6 mL via INTRATHECAL

## 2022-06-24 MED ORDER — FENTANYL CITRATE PF 50 MCG/ML IJ SOSY: 25.0000 ug | PREFILLED_SYRINGE | INTRAMUSCULAR | Status: DC | PRN

## 2022-06-24 MED ORDER — BUPIVACAINE LIPOSOME 1.3 % IJ SUSP: 20.0000 mL | Freq: Once | INTRAMUSCULAR | Status: DC

## 2022-06-24 MED ORDER — OXYCODONE HCL 5 MG/5ML PO SOLN: 5.0000 mg | Freq: Once | ORAL | Status: DC | PRN

## 2022-06-24 MED ORDER — HYDROXYZINE HCL 25 MG PO TABS
50.0000 mg | ORAL_TABLET | Freq: Every day | ORAL | Status: DC
  Administered 2022-06-24 – 2022-06-25 (×2): 50 mg via ORAL
  Filled 2022-06-24 (×2): qty 2

## 2022-06-24 MED ORDER — LACTATED RINGERS IV SOLN: INTRAVENOUS | Status: DC

## 2022-06-24 MED ORDER — SPIRONOLACTONE 25 MG PO TABS
25.0000 mg | ORAL_TABLET | Freq: Every day | ORAL | Status: DC
  Administered 2022-06-25: 25 mg via ORAL
  Filled 2022-06-24: qty 1

## 2022-06-24 MED ORDER — AMISULPRIDE (ANTIEMETIC) 5 MG/2ML IV SOLN: 10.0000 mg | Freq: Once | INTRAVENOUS | Status: DC | PRN

## 2022-06-24 MED ORDER — CHLORHEXIDINE GLUCONATE 0.12 % MT SOLN
15.0000 mL | Freq: Once | OROMUCOSAL | Status: AC
  Administered 2022-06-24: 15 mL via OROMUCOSAL

## 2022-06-24 MED ORDER — EPINEPHRINE PF 1 MG/ML IJ SOLN
INTRAMUSCULAR | Status: AC
  Filled 2022-06-24: qty 1

## 2022-06-24 MED ORDER — ONDANSETRON HCL 4 MG/2ML IJ SOLN
INTRAMUSCULAR | Status: DC | PRN
  Administered 2022-06-24: 4 mg via INTRAVENOUS

## 2022-06-24 MED ORDER — ASPIRIN 81 MG PO CHEW
81.0000 mg | CHEWABLE_TABLET | Freq: Two times a day (BID) | ORAL | Status: DC
  Administered 2022-06-24 – 2022-06-26 (×4): 81 mg via ORAL
  Filled 2022-06-24 (×4): qty 1

## 2022-06-24 MED ORDER — CLONIDINE HCL (ANALGESIA) 100 MCG/ML EP SOLN
EPIDURAL | Status: DC | PRN
  Administered 2022-06-24: 100 ug

## 2022-06-24 MED ORDER — ALUM & MAG HYDROXIDE-SIMETH 200-200-20 MG/5ML PO SUSP: 30.0000 mL | ORAL | Status: DC | PRN

## 2022-06-24 MED ORDER — SACUBITRIL-VALSARTAN 97-103 MG PO TABS
1.0000 | ORAL_TABLET | Freq: Two times a day (BID) | ORAL | Status: DC
  Administered 2022-06-24 – 2022-06-26 (×4): 1 via ORAL
  Filled 2022-06-24 (×4): qty 1

## 2022-06-24 MED ORDER — ACETAMINOPHEN 10 MG/ML IV SOLN: 1000.0000 mg | Freq: Once | INTRAVENOUS | Status: DC | PRN

## 2022-06-24 MED ORDER — ROSUVASTATIN CALCIUM 10 MG PO TABS
10.0000 mg | ORAL_TABLET | Freq: Every day | ORAL | Status: DC
  Administered 2022-06-24 – 2022-06-25 (×2): 10 mg via ORAL
  Filled 2022-06-24 (×2): qty 1

## 2022-06-24 MED ORDER — BUPIVACAINE LIPOSOME 1.3 % IJ SUSP
INTRAMUSCULAR | Status: AC
  Filled 2022-06-24: qty 20

## 2022-06-24 MED ORDER — DIPHENHYDRAMINE HCL 12.5 MG/5ML PO ELIX: 12.5000 mg | ORAL_SOLUTION | ORAL | Status: DC | PRN

## 2022-06-24 MED ORDER — CEFAZOLIN SODIUM-DEXTROSE 2-4 GM/100ML-% IV SOLN: 2.0000 g | INTRAVENOUS | Status: DC

## 2022-06-24 MED ORDER — FENTANYL CITRATE PF 50 MCG/ML IJ SOSY: 50.0000 ug | PREFILLED_SYRINGE | INTRAMUSCULAR | Status: AC

## 2022-06-24 MED ORDER — ACETAMINOPHEN 500 MG PO TABS
1000.0000 mg | ORAL_TABLET | Freq: Once | ORAL | Status: AC
Start: 2022-06-24 — End: 2022-06-24
  Administered 2022-06-24: 1000 mg via ORAL
  Filled 2022-06-24: qty 2

## 2022-06-24 MED ORDER — POLYETHYLENE GLYCOL 3350 17 G PO PACK: 17.0000 g | PACK | Freq: Every day | ORAL | Status: DC | PRN

## 2022-06-24 MED ORDER — PROPOFOL 10 MG/ML IV BOLUS
INTRAVENOUS | Status: DC | PRN
  Administered 2022-06-24: 40 mg via INTRAVENOUS

## 2022-06-24 MED ORDER — TRANEXAMIC ACID-NACL 1000-0.7 MG/100ML-% IV SOLN
1000.0000 mg | Freq: Once | INTRAVENOUS | Status: AC
  Administered 2022-06-24: 1000 mg via INTRAVENOUS
  Filled 2022-06-24: qty 100

## 2022-06-24 MED ORDER — DOCUSATE SODIUM 100 MG PO CAPS
100.0000 mg | ORAL_CAPSULE | Freq: Two times a day (BID) | ORAL | Status: DC
  Administered 2022-06-24 – 2022-06-26 (×4): 100 mg via ORAL
  Filled 2022-06-24 (×4): qty 1

## 2022-06-24 MED ORDER — ONDANSETRON HCL 4 MG/2ML IJ SOLN: 4.0000 mg | Freq: Four times a day (QID) | INTRAMUSCULAR | Status: DC | PRN

## 2022-06-24 MED ORDER — ESCITALOPRAM OXALATE 10 MG PO TABS
10.0000 mg | ORAL_TABLET | Freq: Every day | ORAL | Status: DC
  Administered 2022-06-24 – 2022-06-25 (×2): 10 mg via ORAL
  Filled 2022-06-24 (×2): qty 1

## 2022-06-24 MED ORDER — OXYCODONE HCL 5 MG PO TABS: 5.0000 mg | ORAL_TABLET | Freq: Once | ORAL | Status: DC | PRN

## 2022-06-24 MED ORDER — METHOCARBAMOL 500 MG PO TABS
500.0000 mg | ORAL_TABLET | Freq: Four times a day (QID) | ORAL | Status: DC | PRN
  Administered 2022-06-24 – 2022-06-26 (×4): 500 mg via ORAL
  Filled 2022-06-24 (×4): qty 1

## 2022-06-24 MED ORDER — FENTANYL CITRATE PF 50 MCG/ML IJ SOSY
PREFILLED_SYRINGE | INTRAMUSCULAR | Status: AC
  Administered 2022-06-24: 50 ug via INTRAVENOUS
  Filled 2022-06-24: qty 2

## 2022-06-24 MED ORDER — HYDROMORPHONE HCL 1 MG/ML IJ SOLN: 0.2500 mg | INTRAMUSCULAR | Status: DC | PRN

## 2022-06-24 MED ORDER — MAGNESIUM CITRATE PO SOLN: 1.0000 | Freq: Once | ORAL | Status: DC | PRN

## 2022-06-24 MED ORDER — METOPROLOL SUCCINATE ER 50 MG PO TB24
100.0000 mg | ORAL_TABLET | Freq: Two times a day (BID) | ORAL | Status: DC
  Administered 2022-06-25 – 2022-06-26 (×3): 100 mg via ORAL
  Filled 2022-06-24 (×3): qty 2

## 2022-06-24 SURGICAL SUPPLY — 56 items
BAG COUNTER SPONGE SURGICOUNT (BAG) IMPLANT
BAG SPNG CNTER NS LX DISP (BAG)
BLADE HEX COATED 2.75 (ELECTRODE) ×1 IMPLANT
BLADE SAG 18X100X1.27 (BLADE) ×1 IMPLANT
BLADE SAGITTAL 25.0X1.37X90 (BLADE) ×1 IMPLANT
BLADE SURG 15 STRL LF DISP TIS (BLADE) ×1 IMPLANT
BLADE SURG 15 STRL SS (BLADE) ×1
BLADE SURG SZ10 CARB STEEL (BLADE) IMPLANT
BNDG CMPR MED 10X6 ELC LF (GAUZE/BANDAGES/DRESSINGS) ×1
BNDG ELASTIC 6X10 VLCR STRL LF (GAUZE/BANDAGES/DRESSINGS) ×1 IMPLANT
BOWL SMART MIX CTS (DISPOSABLE) IMPLANT
BSPLAT TIB 4 KN TRITANIUM (Knees) ×1 IMPLANT
CLSR STERI-STRIP ANTIMIC 1/2X4 (GAUZE/BANDAGES/DRESSINGS) ×1 IMPLANT
COVER SURGICAL LIGHT HANDLE (MISCELLANEOUS) ×1 IMPLANT
CUFF TOURN SGL QUICK 34 (TOURNIQUET CUFF) ×1
CUFF TRNQT CYL 34X4.125X (TOURNIQUET CUFF) ×1 IMPLANT
DRAPE U-SHAPE 47X51 STRL (DRAPES) ×1 IMPLANT
DRSG MEPILEX POST OP 4X12 (GAUZE/BANDAGES/DRESSINGS) ×1 IMPLANT
DRSG MEPILEX POST OP 4X8 (GAUZE/BANDAGES/DRESSINGS) IMPLANT
DURAPREP 26ML APPLICATOR (WOUND CARE) ×2 IMPLANT
FEMORAL RETAINING CRUC KNEE #4 (Knees) IMPLANT
GLOVE BIO SURGEON STRL SZ7.5 (GLOVE) ×2 IMPLANT
GLOVE BIOGEL PI IND STRL 7.5 (GLOVE) ×1 IMPLANT
GLOVE BIOGEL PI IND STRL 8 (GLOVE) ×1 IMPLANT
GLOVE SURG SYN 7.5  E (GLOVE) ×1
GLOVE SURG SYN 7.5 E (GLOVE) ×1 IMPLANT
GLOVE SURG SYN 7.5 PF PI (GLOVE) ×1 IMPLANT
GOWN STRL REUS W/ TWL LRG LVL3 (GOWN DISPOSABLE) ×1 IMPLANT
GOWN STRL REUS W/ TWL XL LVL3 (GOWN DISPOSABLE) ×1 IMPLANT
GOWN STRL REUS W/TWL LRG LVL3 (GOWN DISPOSABLE) ×1
GOWN STRL REUS W/TWL XL LVL3 (GOWN DISPOSABLE) ×1
HANDPIECE INTERPULSE COAX TIP (DISPOSABLE) ×1
HOLDER FOLEY CATH W/STRAP (MISCELLANEOUS) IMPLANT
IMMOBILIZER KNEE 20 (SOFTGOODS) ×1
IMMOBILIZER KNEE 20 THIGH 36 (SOFTGOODS) IMPLANT
IMMOBILIZER KNEE 22 UNIV (SOFTGOODS) IMPLANT
INSERT TRIATH CS SZ4 10 (Insert) IMPLANT
KNEE PATELLA ASYMMETRIC 10X32 (Knees) IMPLANT
KNEE TIBIAL COMP TRI SZ4 (Knees) IMPLANT
MANIFOLD NEPTUNE II (INSTRUMENTS) ×1 IMPLANT
NS IRRIG 1000ML POUR BTL (IV SOLUTION) ×1 IMPLANT
PACK TOTAL KNEE CUSTOM (KITS) ×1 IMPLANT
PIN FLUTED HEDLESS FIX 3.5X1/8 (PIN) IMPLANT
PROTECTOR NERVE ULNAR (MISCELLANEOUS) ×1 IMPLANT
SET HNDPC FAN SPRY TIP SCT (DISPOSABLE) ×1 IMPLANT
SPIKE FLUID TRANSFER (MISCELLANEOUS) ×1 IMPLANT
STRIP CLOSURE SKIN 1/2X4 (GAUZE/BANDAGES/DRESSINGS) IMPLANT
SUT MNCRL AB 3-0 PS2 18 (SUTURE) ×1 IMPLANT
SUT VIC AB 0 CT1 36 (SUTURE) ×1 IMPLANT
SUT VIC AB 1 CT1 36 (SUTURE) ×2 IMPLANT
SUT VIC AB 2-0 CT1 27 (SUTURE) ×1
SUT VIC AB 2-0 CT1 TAPERPNT 27 (SUTURE) ×1 IMPLANT
TRAY FOLEY MTR SLVR 14FR STAT (SET/KITS/TRAYS/PACK) IMPLANT
TRAY FOLEY MTR SLVR 16FR STAT (SET/KITS/TRAYS/PACK) IMPLANT
TUBE SUCTION HIGH CAP CLEAR NV (SUCTIONS) ×1 IMPLANT
WRAP KNEE MAXI GEL POST OP (GAUZE/BANDAGES/DRESSINGS) ×1 IMPLANT

## 2022-06-24 NOTE — Op Note (Signed)
DATE OF SURGERY:  06/24/2022 TIME: 1:57 PM  PATIENT NAME:  Angela [name]   AGE: 77 y.o.    PRE-OPERATIVE DIAGNOSIS:  OA LEFT KNEE  POST-OPERATIVE DIAGNOSIS:  Same  PROCEDURE:  Procedure(s): TOTAL KNEE ARTHROPLASTY   SURGEON:  Renette Butters, MD   ASSISTANT:  Aggie Moats, PA-C, she was present and scrubbed throughout the case, critical for completion in a timely fashion, and for retraction, instrumentation, and closure.    OPERATIVE IMPLANTS: [employer] Triathlon CR. Press fit knee  Femur size 4, Tibia size 4, Patella size 32 3-peg oval button, with a 10 mm polyethylene insert.   PREOPERATIVE INDICATIONS:  Angela [name] is a 77 y.o. year old female with end stage bone on bone degenerative arthritis of the knee who failed conservative treatment, including injections, antiinflammatories, activity modification, and assistive devices, and had significant impairment of their activities of daily living, and elected for Total Knee Arthroplasty.   The risks, benefits, and alternatives were discussed at length including but not limited to the risks of infection, bleeding, nerve injury, stiffness, blood clots, the need for revision surgery, cardiopulmonary complications, among others, and they were willing to proceed.   OPERATIVE DESCRIPTION:  The patient was brought to the operative room and placed in a supine position.  General anesthesia was administered.  IV antibiotics were given.  The lower extremity was prepped and draped in the usual sterile fashion.  Time out was performed.  The leg was elevated and exsanguinated and the tourniquet was inflated.  Anterior approach was performed.  The patella was everted and osteophytes were removed.  The anterior horn of the medial and lateral meniscus was removed.   The distal femur was opened with the drill and the intramedullary distal femoral cutting jig was utilized, set at 5 degrees resecting 8 mm off the distal femur.  Care was  taken to protect the collateral ligaments.  The distal femoral sizing jig was applied, taking care to avoid notching.  Then the 4-in-1 cutting jig was applied and the anterior and posterior femur was cut, along with the chamfer cuts.  All posterior osteophytes were removed.  The flexion gap was then measured and was symmetric with the extension gap.  Then the extramedullary tibial cutting jig was utilized making the appropriate cut using the anterior tibial crest as a reference building in appropriate posterior slope.  Care was taken during the cut to protect the medial and collateral ligaments.  The proximal tibia was removed along with the posterior horns of the menisci.    I completed the distal femoral preparation using the appropriate jig to prepare the box.  The patella was then measured, and cut with the saw.    The proximal tibia sized and prepared accordingly with the reamer and the punch, and then all components were trialed with the above sized poly insert.  The knee was found to have excellent balance and full motion.    The above named components were then impacted into place and Poly tibial piece and patella were inserted.  I was very happy with his stability and ROM  I performed a periarticular injection with Exparel  The knee was easily taken through a range of motion and the patella tracked well and the knee irrigated copiously and the parapatellar and subcutaneous tissue closed with vicryl, and monocryl with steri strips for the skin.  The incision was dressed with sterile gauze and the tourniquet released and the patient was awakened and returned to the  PACU in stable and satisfactory condition.  There were no complications.  Total tourniquet time was roughly 75 minutes.   POSTOPERATIVE PLAN: post op Abx, DVT px: SCD's, Angela's, Early ambulation and chemical px

## 2022-06-24 NOTE — Discharge Instructions (Signed)

## 2022-06-24 NOTE — Interval H&P Note (Signed)
History and Physical Interval Note:  06/24/2022 12:52 PM  Angela [name]  has presented today for surgery, with the diagnosis of OA LEFT KNEE.  The various methods of treatment have been discussed with the patient and family. After consideration of risks, benefits and other options for treatment, the patient has consented to  Procedure(s): TOTAL KNEE ARTHROPLASTY (Left) as a surgical intervention.  The patient's history has been reviewed, patient examined, no change in status, stable for surgery.  I have reviewed the patient's chart and labs.  Questions were answered to the patient's satisfaction.     Renette Butters

## 2022-06-24 NOTE — Evaluation (Signed)
Physical Therapy Evaluation Patient Details Name: Angela [name] MRN: [id number] DOB: 08/17/1945 Today's Date: 06/24/2022  History of Present Illness  Pt is a 77yo female presenting s/p L-TKA on 06/24/22. PMH: hx of colon cancer, CHF, fibromyalgia, GERD, HLD, HTN, IBS, MRSA, OSA, RLS, tachycardia, cervical ACDF C3-C5, s/p stent s/p cath, CHF   Clinical Impression  Angela [name] is a 77 y.o. female POD 0 s/p L-TKA. Patient reports modified independence using primarily [place] with mobility at baseline. Patient is now limited by functional impairments (see PT problem list below) and requires min guard for bed mobility and for transfers. Patient was able to ambulate 20 feet with RW and min guard level of assist. Patient instructed in exercise to facilitate ROM and circulation to manage edema. Patient will benefit from continued skilled PT interventions to address impairments and progress towards PLOF. Acute PT will follow to progress mobility and HEP in preparation for safe discharge home.       Recommendations for follow up therapy are one component of a multi-disciplinary discharge planning process, led by the attending physician.  Recommendations may be updated based on patient status, additional functional criteria and insurance authorization.  Follow Up Recommendations Follow physician's recommendations for discharge plan and follow up therapies      Assistance Recommended at Discharge Frequent or constant Supervision/Assistance  Patient can return home with the following  A little help with walking and/or transfers;A little help with bathing/dressing/bathroom;Assistance with cooking/housework;Assist for transportation;Help with stairs or ramp for entrance    Equipment Recommendations None recommended by PT  Recommendations for Other Services       Functional Status Assessment Patient has had a recent decline in their functional status and demonstrates the ability to make  significant improvements in function in a reasonable and predictable amount of time.     Precautions / Restrictions Precautions Precautions: Fall;Knee Precaution Booklet Issued: No Precaution Comments: no pillow under the knee Restrictions Weight Bearing Restrictions: No Other Position/Activity Restrictions: wbat      Mobility  Bed Mobility Overal bed mobility: Needs Assistance Bed Mobility: Supine to Sit, Sit to Supine     Supine to sit: Min guard, HOB elevated Sit to supine: Min guard   General bed mobility comments: For safety only, no physical assist required    Transfers Overall transfer level: Needs assistance Equipment used: Rolling walker (2 wheels) Transfers: Sit to/from Stand Sit to Stand: Min guard, From elevated surface           General transfer comment: For safety only, no physical assist required, VCs for sequencing and pwoering up from bed. Pt completed lateral weight shifts and 1-2 reps of standing static marches.    Ambulation/Gait Ambulation/Gait assistance: Min guard Gait Distance (Feet): 20 Feet Assistive device: Rolling walker (2 wheels) Gait Pattern/deviations: Step-to pattern Gait velocity: decreased     General Gait Details: Pt ambulated with RW and min guard no physical assist required or overt LOB noted. Required VCs for prximity to device  Stairs            Wheelchair Mobility    Modified Rankin (Stroke Patients Only)       Balance                                             Pertinent Vitals/Pain Pain Assessment Pain Assessment: 0-10 Pain Score: 3  Pain Location:  L knee Pain Descriptors / Indicators: Operative site guarding Pain Intervention(s): Limited activity within patient's tolerance, Monitored during session, Repositioned    Home Living Family/patient expects to be discharged to:: Private residence Living Arrangements: Spouse/significant other;Other relatives;Children Available Help at  Discharge: Family;Available 24 hours/day Type of Home: House Home Access: Level entry       Home Layout: Two level;Able to live on main level with bedroom/bathroom Home Equipment: Rolling Walker (2 wheels);Cane - single point;BSC/3in1      Prior Function Prior Level of Function : Independent/Modified Independent             Mobility Comments: SPC during painful episodes, including last week while on a cruise ADLs Comments: IND     Hand Dominance        Extremity/Trunk Assessment   Upper Extremity Assessment Upper Extremity Assessment: Overall WFL for tasks assessed    Lower Extremity Assessment Lower Extremity Assessment: LLE deficits/detail;RLE deficits/detail RLE Deficits / Details: MMT ank DF/PF 5/5 RLE Sensation: WNL LLE Deficits / Details: MMT ank DF/PF 5/5, no extensor lag notede LLE Sensation: WNL    Cervical / Trunk Assessment Cervical / Trunk Assessment: Kyphotic  Communication   Communication: No difficulties  Cognition Arousal/Alertness: Awake/alert Behavior During Therapy: WFL for tasks assessed/performed Overall Cognitive Status: Within Functional Limits for tasks assessed                                          General Comments      Exercises Total Joint Exercises Ankle Circles/Pumps: AROM, Both, 10 reps, Supine   Assessment/Plan    PT Assessment Patient needs continued PT services  PT Problem List Decreased strength;Decreased range of motion;Decreased activity tolerance;Decreased balance;Decreased mobility;Pain       PT Treatment Interventions DME instruction;Gait training;Stair training;Functional mobility training;Therapeutic activities;Therapeutic exercise;Balance training;Neuromuscular re-education;Patient/family education    PT Goals (Current goals can be found in the Care Plan section)  Acute Rehab PT Goals Patient Stated Goal: To walk without pain PT Goal Formulation: With patient Time For Goal Achievement:  07/01/22 Potential to Achieve Goals: Good    Frequency 7X/week     Co-evaluation               AM-PAC PT "6 Clicks" Mobility  Outcome Measure Help needed turning from your back to your side while in a flat bed without using bedrails?: None Help needed moving from lying on your back to sitting on the side of a flat bed without using bedrails?: A Little Help needed moving to and from a bed to a chair (including a wheelchair)?: A Little Help needed standing up from a chair using your arms (e.g., wheelchair or bedside chair)?: A Little Help needed to walk in hospital room?: A Little Help needed climbing 3-5 steps with a railing? : A Little 6 Click Score: 19    End of Session Equipment Utilized During Treatment: Gait belt Activity Tolerance: Patient tolerated treatment well;No increased pain Patient left: in bed;with call bell/phone within reach;with bed alarm set;with nursing/sitter in room Nurse Communication: Mobility status PT Visit Diagnosis: Pain;Difficulty in walking, not elsewhere classified (R26.2) Pain - Right/Left: Left Pain - part of body: Knee    Time: 1735-1801 PT Time Calculation (min) (ACUTE ONLY): 26 min   Charges:   PT Evaluation $PT Eval Low Complexity: 1 Low PT Treatments $Gait Training: 8-22 mins  Coolidge Breeze, PT, DPT [facility] Office: [phone] Weekend pager: [phone]  Coolidge Breeze 06/24/2022, 6:08 PM

## 2022-06-24 NOTE — Anesthesia Procedure Notes (Signed)
Spinal  Patient location during procedure: OR Start time: 06/24/2022 1:01 PM End time: 06/24/2022 1:04 PM Reason for block: surgical anesthesia Staffing Performed: resident/CRNA  Resident/CRNA: [place] Brevyn Ring C, CRNA Performed by: [place] Roberto Romanoski C, CRNA Authorized by: Barnet Glasgow, MD   Preanesthetic Checklist Completed: patient identified, IV checked, site marked, risks and benefits discussed, surgical consent, monitors and equipment checked, pre-op evaluation and timeout performed Spinal Block Patient position: sitting Prep: DuraPrep and site prepped and draped Patient monitoring: heart rate, cardiac monitor, continuous pulse ox and blood pressure Approach: midline Location: L3-4 Injection technique: single-shot Needle Needle type: Pencan  Needle gauge: 24 G Needle length: 9 cm Assessment Sensory level: T4 Events: CSF return Additional Notes IV functioning, monitors applied to pt. Expiration date of kit checked and confirmed to be in date. Sterile prep and drape, hand hygiene and sterile gloved used. Pt was positioned and spine was prepped in sterile fashion. Skin was anesthetized with lidocaine. Free flow of clear CSF obtained prior to injecting local anesthetic into CSF x 1 attempt. Spinal needle aspirated freely following injection. Needle was carefully withdrawn, and pt tolerated procedure well. Loss of motor and sensory on exam post injection.

## 2022-06-24 NOTE — Anesthesia Procedure Notes (Signed)
Anesthesia Regional Block: Adductor canal block   Pre-Anesthetic Checklist: , timeout performed,  Correct Patient, Correct Site, Correct Laterality,  Correct Procedure, Correct Position, site marked,  Risks and benefits discussed,  Surgical consent,  Pre-op evaluation,  At surgeon's request and post-op pain management  Laterality: Lower and Left  Prep: chloraprep       Needles:  Injection technique: Single-shot  Needle Type: Echogenic Needle     Needle Length: 9cm  Needle Gauge: 22     Additional Needles:   Procedures:,,,, ultrasound used (permanent image in chart),,    Narrative:  Start time: 06/24/2022 11:57 AM End time: 06/24/2022 12:04 PM Injection made incrementally with aspirations every 5 mL.  Performed by: Personally  Anesthesiologist: Barnet Glasgow, MD  Additional Notes: Block assessed prior to surgery. Pt tolerated procedure well.

## 2022-06-24 NOTE — Plan of Care (Signed)

## 2022-06-24 NOTE — Anesthesia Postprocedure Evaluation (Signed)
Anesthesia Post Note  Patient: Angela [name]  Procedure(s) Performed: TOTAL KNEE ARTHROPLASTY (Left: Knee)     Patient location during evaluation: Nursing Unit Anesthesia Type: Regional and Spinal Level of consciousness: oriented and awake and alert Pain management: pain level controlled Vital Signs Assessment: post-procedure vital signs reviewed and stable Respiratory status: spontaneous breathing and respiratory function stable Cardiovascular status: blood pressure returned to baseline and stable Postop Assessment: no headache, no backache, no apparent nausea or vomiting and patient able to bend at knees Anesthetic complications: no  No notable events documented.  Last Vitals:  Vitals:   06/24/22 1615 06/24/22 1630  BP: (!) 101/55 (!) 100/57  Pulse: (!) 59 63  Resp: 19 17  Temp:    SpO2: 100% 100%    Last Pain:  Vitals:   06/24/22 1630  TempSrc:   PainSc: 0-No pain                 Barnet Glasgow

## 2022-06-24 NOTE — Progress Notes (Signed)
Orthopedic Tech Progress Note Patient Details:  Angela [name] [birth year] [id number]  Patient ID: Angela [name], female   DOB: 11-Jul-1945, 77 y.o.   MRN: [id number]  Angela [name] 06/24/2022, 3:44 PM Cpm applied in pacu. Bone foam placed on bed

## 2022-06-24 NOTE — Transfer of Care (Signed)
Immediate Anesthesia Transfer of Care Note  Patient: Angela [name]  Procedure(s) Performed: TOTAL KNEE ARTHROPLASTY (Left: Knee)  Patient Location: PACU  Anesthesia Type:Spinal  Level of Consciousness: awake, alert , and oriented  Airway & Oxygen Therapy: Patient Spontanous Breathing and Patient connected to face mask oxygen  Post-op Assessment: Report given to RN and Post -op Vital signs reviewed and stable  Post vital signs: Reviewed and stable  Last Vitals:  Vitals Value Taken Time  BP 77/48 06/24/22 1441  Temp    Pulse 60 06/24/22 1442  Resp 13 06/24/22 1442  SpO2 99 % 06/24/22 1442  Vitals shown include unvalidated device data.  Last Pain:  Vitals:   06/24/22 1058  TempSrc:   PainSc: 3          Complications: No notable events documented.

## 2022-06-24 NOTE — Anesthesia Preprocedure Evaluation (Addendum)
Anesthesia Evaluation  Patient identified by MRN, date of birth, ID band Patient awake    Reviewed: Allergy & Precautions, NPO status , Patient's Chart, lab work & pertinent test results  History of Anesthesia Complications (+) PONV and history of anesthetic complications  Airway Mallampati: II  TM Distance: >3 FB Neck ROM: Full    Dental no notable dental hx. (+) Teeth Intact, Dental Advisory Given   Pulmonary sleep apnea and Continuous Positive Airway Pressure Ventilation    Pulmonary exam normal breath sounds clear to auscultation       Cardiovascular hypertension, + CAD and +CHF  Normal cardiovascular exam Rhythm:Regular Rate:Normal  07/2021  Echo  1. Left ventricular ejection fraction by 3D volume is 63 %. The left  ventricle has normal function. The left ventricle has no regional wall  motion abnormalities. There is mild concentric left ventricular  hypertrophy. Left ventricular diastolic  parameters were normal.   2. Right ventricular systolic function is normal. The right ventricular  size is normal. There is normal pulmonary artery systolic pressure.   3. The mitral valve is normal in structure. Mild to moderate mitral valve  regurgitation. No evidence of mitral stenosis.   4. Tricuspid valve regurgitation is mild to moderate.   5. The aortic valve is tricuspid. Aortic valve regurgitation is not  visualized. Aortic valve sclerosis/calcification is present, without any  evidence of aortic stenosis.   6. There is borderline dilatation of the ascending aorta, measuring 37  mm.   7. The inferior vena cava is normal in size with greater than 50%  respiratory variability, suggesting right atrial pressure of 3 mmHg.     Neuro/Psych  PSYCHIATRIC DISORDERS Anxiety Depression     Neuromuscular disease    GI/Hepatic ,GERD  ,,  Endo/Other    Renal/GU Renal diseaseLab Results      Component                Value                Date                      WBC                      8.1                 06/10/2022                HGB                      13.6                06/10/2022                HCT                      41.1                06/10/2022                MCV                      91.7                06/10/2022                PLT  275                 06/10/2022                Musculoskeletal  (+) Arthritis , Osteoarthritis,  Fibromyalgia -  Abdominal   Peds  Hematology Lab Results      Component                Value               Date                      WBC                      8.1                 06/10/2022                HGB                      13.6                06/10/2022                HCT                      41.1                06/10/2022                MCV                      91.7                06/10/2022                PLT                      275                 06/10/2022              Anesthesia Other Findings All: Phenergan, Timenten, lyrica, Norvasc, reglan  Reproductive/Obstetrics                             Anesthesia Physical Anesthesia Plan  ASA: 3  Anesthesia Plan: Spinal and Regional   Post-op Pain Management: Regional block* and Minimal or no pain anticipated   Induction:   PONV Risk Score and Plan: 3 and Treatment may vary due to age or medical condition and Midazolam  Airway Management Planned: Nasal Cannula  Additional Equipment: None  Intra-op Plan:   Post-operative Plan:   Informed Consent: I have reviewed the patients History and Physical, chart, labs and discussed the procedure including the risks, benefits and alternatives for the proposed anesthesia with the patient or authorized representative who has indicated his/her understanding and acceptance.     Dental advisory given  Plan Discussed with:   Anesthesia Plan Comments: (Spinal w L adductor canal block)        Anesthesia Quick  Evaluation

## 2022-06-25 ENCOUNTER — Encounter (HOSPITAL_COMMUNITY): Payer: Self-pay | Admitting: Orthopedic Surgery

## 2022-06-25 ENCOUNTER — Other Ambulatory Visit: Payer: Self-pay

## 2022-06-25 DIAGNOSIS — M1712 Unilateral primary osteoarthritis, left knee: Secondary | ICD-10-CM | POA: Diagnosis not present

## 2022-06-25 NOTE — Progress Notes (Signed)
PT TX NOTE   06/25/22 1500  PT Visit Information  Last PT Received On 06/25/22  Assistance Needed  Pt fatigued, just up to bathroom and back to bed with nursing staff.  Agreeable to exercises. Reviewed TKA HEP and pt tol well, progressing with quad activation and knee ROM. Pt back in CPM per ortho tech after PT session   History of Present Illness Pt is a 77yo female presenting s/p L-TKA on 06/24/22. PMH: hx of colon cancer, CHF, fibromyalgia, GERD, HLD, HTN, IBS, MRSA, OSA, RLS, tachycardia, cervical ACDF C3-C5, s/p stent s/p cath, CHF  Subjective Data  Patient Stated Goal To walk without pain  Precautions  Precautions Fall;Knee  Precaution Booklet Issued No  Precaution Comments no pillow under the knee  Restrictions  Weight Bearing Restrictions No  Other Position/Activity Restrictions wbat  Pain Assessment  Pain Assessment 0-10  Pain Score 6  Pain Location L knee  Pain Descriptors / Indicators Operative site guarding  Pain Intervention(s) Limited activity within patient's tolerance;Monitored during session;Premedicated before session  Cognition  Arousal/Alertness Awake/alert  Behavior During Therapy WFL for tasks assessed/performed  Overall Cognitive Status Within Functional Limits for tasks assessed  Bed Mobility  General bed mobility comments  (pt just back to bed with nursing staff)  Exercises  Exercises Total Joint  Total Joint Exercises  Ankle Circles/Pumps AROM;Both;10 reps;Supine  Quad Sets AROM;Left;10 reps  Short Arc Quad AAROM;AROM;Left;10 reps  Heel Slides AAROM;Left;10 reps  Straight Leg Raises AROM;AAROM;Left;10 reps  Hip ABduction/ADduction AROM;Left;10 reps  Goniometric ROM grossly 5 to 60 degrees L knee flexion  PT - End of Session  Equipment Utilized During Treatment Gait belt  Activity Tolerance Patient tolerated treatment well;No increased pain  Patient left in bed;with call bell/phone within reach;with bed alarm set;with nursing/sitter in room  Nurse  Communication Mobility status   PT - Assessment/Plan  PT Plan Current plan remains appropriate  PT Visit Diagnosis Pain;Difficulty in walking, not elsewhere classified (R26.2)  Pain - Right/Left Left  Pain - part of body Knee  PT Frequency (ACUTE ONLY) 7X/week  Follow Up Recommendations Follow physician's recommendations for discharge plan and follow up therapies  Assistance recommended at discharge Frequent or constant Supervision/Assistance  Patient can return home with the following A little help with walking and/or transfers;A little help with bathing/dressing/bathroom;Assistance with cooking/housework;Assist for transportation;Help with stairs or ramp for entrance  PT equipment None recommended by PT  AM-PAC PT "6 Clicks" Mobility Outcome Measure (Version 2)  Help needed turning from your back to your side while in a flat bed without using bedrails? 4  Help needed moving from lying on your back to sitting on the side of a flat bed without using bedrails? 3  Help needed moving to and from a bed to a chair (including a wheelchair)? 3  Help needed standing up from a chair using your arms (e.g., wheelchair or bedside chair)? 3  Help needed to walk in hospital room? 3  Help needed climbing 3-5 steps with a railing?  3  6 Click Score 19  Consider Recommendation of Discharge To: Home with [facility]  Progressive Mobility  What is the highest level of mobility based on the progressive mobility assessment? Level 5 (Walks with assist in room/hall) - Balance while stepping forward/back and can walk in room with assist - Complete  PT Goal Progression  Progress towards PT goals Progressing toward goals  Acute Rehab PT Goals  PT Goal Formulation With patient  Time For Goal Achievement 07/01/22  Potential to Achieve Goals Good  PT Time Calculation  PT Start Time (ACUTE ONLY) 1445  PT Stop Time (ACUTE ONLY) 1506  PT Time Calculation (min) (ACUTE ONLY) 21 min  PT General Charges  $$ ACUTE PT VISIT 1  Visit  PT Treatments  $Therapeutic Exercise 8-22 mins

## 2022-06-25 NOTE — Plan of Care (Signed)
  Problem: Activity: Goal: Ability to avoid complications of mobility impairment will improve Outcome: Progressing Goal: Range of joint motion will improve Outcome: Progressing   Problem: Pain Management: Goal: Pain level will decrease with appropriate interventions Outcome: Progressing   Problem: Safety: Goal: Ability to remain free from injury will improve Outcome: Progressing

## 2022-06-25 NOTE — TOC Transition Note (Signed)
Transition of Care [facility]) - CM/SW Discharge Note  Patient Details  Name: Angela [name] MRN: [id number] Date of Birth: [birth year]  Transition of Care [facility]) CM/SW Contact:  Sherie Don, LCSW Phone Number: 06/25/2022, 11:31 AM  Clinical Narrative: Patient is expected to discharge home after working with PT. CSW met with patient to confirm discharge plan. Patient will discharge home with [facility] at [facility]. Patient has a rolling walker, cane, and BSC at home so there are no DME needs at this time. TOC signing off.    Final next level of care: OP Rehab Barriers to Discharge: No Barriers Identified  Patient Goals and CMS Choice Choice offered to / list presented to : NA  Discharge Plan and Services Additional resources added to the After Visit Summary for        DME Arranged: [name] DME Agency: NA  Social Determinants of Health (SDOH) Interventions SDOH Screenings   Food Insecurity: Unknown (06/24/2022)  Housing: Low Risk  (06/24/2022)  Transportation Needs: No Transportation Needs (06/24/2022)  Utilities: Not At Risk (06/24/2022)  Tobacco Use: Low Risk  (06/24/2022)   Readmission Risk Interventions     No data to display

## 2022-06-25 NOTE — Progress Notes (Signed)
Physical Therapy Treatment Patient Details Name: Angela [name] MRN: [id number] DOB: Oct 04, 1945 Today's Date: 06/25/2022   History of Present Illness Pt is a 77yo female presenting s/p L-TKA on 06/24/22. PMH: hx of colon cancer, CHF, fibromyalgia, GERD, HLD, HTN, IBS, MRSA, OSA, RLS, tachycardia, cervical ACDF C3-C5, s/p stent s/p cath, CHF    PT Comments    Pt progressing well this session. Incr gait distance however pt experienced some slight dizziness and fatigue so provided with seated rest and returned to room via recliner. Continue to follow. Pt feels she may need an additional day to maximize her independence as her husband is unabel to physically assist    Recommendations for follow up therapy are one component of a multi-disciplinary discharge planning process, led by the attending physician.  Recommendations may be updated based on patient status, additional functional criteria and insurance authorization.  Follow Up Recommendations  Follow physician's recommendations for discharge plan and follow up therapies     Assistance Recommended at Discharge Frequent or constant Supervision/Assistance  Patient can return home with the following A little help with walking and/or transfers;A little help with bathing/dressing/bathroom;Assistance with cooking/housework;Assist for transportation;Help with stairs or ramp for entrance   Equipment Recommendations  None recommended by PT    Recommendations for Other Services       Precautions / Restrictions Precautions Precautions: Fall;Knee Precaution Booklet Issued: No Precaution Comments: no pillow under the knee Restrictions Weight Bearing Restrictions: No Other Position/Activity Restrictions: wbat     Mobility  Bed Mobility Overal bed mobility: Needs Assistance Bed Mobility: Supine to Sit     Supine to sit: Supervision     General bed mobility comments: For safety only, no physical assist required     Transfers Overall transfer level: Needs assistance Equipment used: Rolling walker (2 wheels) Transfers: Sit to/from Stand Sit to Stand: Min guard           General transfer comment: For safety only, no physical assist required, VCs for sequencing and powering up from bed.    Ambulation/Gait Ambulation/Gait assistance: Min guard Gait Distance (Feet): 50 Feet Assistive device: Rolling walker (2 wheels) Gait Pattern/deviations: Step-to pattern Gait velocity: decreased     General Gait Details: Pt ambulated with RW and min guard no physical assist required or overt LOB noted. Required VCs for proximity to device   Stairs             Wheelchair Mobility    Modified Rankin (Stroke Patients Only)       Balance                                            Cognition Arousal/Alertness: Awake/alert Behavior During Therapy: WFL for tasks assessed/performed Overall Cognitive Status: Within Functional Limits for tasks assessed                                          Exercises Total Joint Exercises Ankle Circles/Pumps: AROM, Both, 10 reps, Supine    General Comments        Pertinent Vitals/Pain Pain Assessment Pain Assessment: 0-10 Pain Score: 4  Pain Location: L knee Pain Descriptors / Indicators: Operative site guarding Pain Intervention(s): Limited activity within patient's tolerance, Monitored during session, Premedicated before session, Repositioned, Ice applied  Home Living                          Prior Function            PT Goals (current goals can now be found in the care plan section) Acute Rehab PT Goals Patient Stated Goal: To walk without pain PT Goal Formulation: With patient Time For Goal Achievement: 07/01/22 Potential to Achieve Goals: Good Progress towards PT goals: Progressing toward goals    Frequency    7X/week      PT Plan Current plan remains appropriate     Co-evaluation              AM-PAC PT "6 Clicks" Mobility   Outcome Measure  Help needed turning from your back to your side while in a flat bed without using bedrails?: None Help needed moving from lying on your back to sitting on the side of a flat bed without using bedrails?: A Little Help needed moving to and from a bed to a chair (including a wheelchair)?: A Little Help needed standing up from a chair using your arms (e.g., wheelchair or bedside chair)?: A Little Help needed to walk in hospital room?: A Little Help needed climbing 3-5 steps with a railing? : A Little 6 Click Score: 19    End of Session Equipment Utilized During Treatment: Gait belt Activity Tolerance: Patient tolerated treatment well;No increased pain Patient left: in bed;with call bell/phone within reach;with bed alarm set;with nursing/sitter in room Nurse Communication: Mobility status PT Visit Diagnosis: Pain;Difficulty in walking, not elsewhere classified (R26.2) Pain - Right/Left: Left Pain - part of body: Knee     Time: 2500-3704 PT Time Calculation (min) (ACUTE ONLY): 16 min  Charges:  $Gait Training: 8-22 mins                     Baxter Flattery, PT  Acute Rehab Dept [facility]) [phone]  [facility] Weekend Pager ([place] only)  [phone]  06/25/2022    [facility] 06/25/2022, 12:05 PM

## 2022-06-25 NOTE — Progress Notes (Signed)
Orthopedic Tech Progress Note Patient Details:  [name] 12-15-1945 [id number]  Patient ID: Angela [name], female   DOB: 03-02-1946, 77 y.o.   MRN: [id number]  Angela [name] 06/25/2022, 3:15 PM Cpm applied

## 2022-06-25 NOTE — TOC Progression Note (Signed)
Transition of Care [facility]) - Progression Note   Patient Details  Name: Angela [name] MRN: [id number] Date of Birth: 10/21/1945  Transition of Care [facility]) CM/SW [place], LCSW Phone Number: 06/25/2022, 3:38 PM  Clinical Narrative: CSW notified by ortho PA that patient will now need HHPT set up prior to discharging home. CSW set patient up with [facility] for [place]. TOC awaiting HHPT orders.  Barriers to Discharge: No Barriers Identified  Expected Discharge Plan and Services            DME Arranged: [name] DME Agency: NA HH Arranged: PT HH Agency: [place] Date [facility] Agency Contacted: 06/25/22 Representative spoke with at [place]: Cindie  Social Determinants of Health ([place]) Interventions SDOH Screenings   Food Insecurity: Unknown (06/24/2022)  Housing: Low Risk  (06/24/2022)  Transportation Needs: No Transportation Needs (06/24/2022)  Utilities: Not At Risk (06/24/2022)  Tobacco Use: Low Risk  (06/25/2022)   Readmission Risk Interventions     No data to display

## 2022-06-25 NOTE — Progress Notes (Signed)
Subjective: Patient reports pain as marked. Unable to get her usual daily narcotic for chronic pain since hospital doesn't carry it. Tolerating diet.  Urinating.   No CP, SOB.  Has mobilized some OOB with PT.  Objective:   VITALS:   Vitals:   06/25/22 0153 06/25/22 0239 06/25/22 0543 06/25/22 1002  BP: (!) 93/52 111/62 130/70 (!) 143/71  Pulse: 66 66 69 70  Resp: '14 16 14 18  '$ Temp: (!) 97.4 F (36.3 C) 97.7 F (36.5 C) 97.6 F (36.4 C) 97.9 F (36.6 C)  TempSrc:  Oral  Oral  SpO2: 96% 97% 98% 100%  Weight:      Height:          Latest Ref Rng & Units 06/10/2022    1:30 PM 02/07/2022    9:02 AM 02/07/2022    9:01 AM  CBC  WBC 4.0 - 10.5 K/uL 8.1     Hemoglobin 12.0 - 15.0 g/dL 13.6  10.2  10.2   Hematocrit 36.0 - 46.0 % 41.1  30.0  30.0   Platelets 150 - 400 K/uL 275         Latest Ref Rng & Units 06/10/2022    1:30 PM 02/25/2022    3:44 PM 02/07/2022    9:02 AM  BMP  Glucose 70 - 99 mg/dL 116  99    BUN 8 - 23 mg/dL 19  11    Creatinine 0.44 - 1.00 mg/dL 1.22  1.26    Sodium 135 - 145 mmol/L 141  143  143   Potassium 3.5 - 5.1 mmol/L 4.7  3.7  4.1   Chloride 98 - 111 mmol/L 106  110    CO2 22 - 32 mmol/L 26  28    Calcium 8.9 - 10.3 mg/dL 8.6  8.2     Intake/Output      01/23 0701 01/24 0700 01/24 0701 01/25 0700   P.O. 480    I.V. (mL/kg) 1400 (17.7)    IV Piggyback 300    Total Intake(mL/kg) 2180 (27.6)    Urine (mL/kg/hr) 1225    Blood 25    Total Output 1250    Net +930         Urine Occurrence  1 x      Physical Exam: General: NAD.  Laying in bed, calm, comfortable Resp: No increased wob Cardio: regular rate and rhythm ABD soft Neurologically intact MSK Neurovascularly intact Sensation intact distally Intact pulses distally Dorsiflexion/Plantar flexion intact Incision: dressing C/D/I   Assessment: 1 Day Post-Op  S/P Procedure(s) (LRB): TOTAL KNEE ARTHROPLASTY (Left) by Dr. Ernesta Amble. Percell Miller on 06/24/22  Principal Problem:   S/P total  knee arthroplasty, left    Plan: Push her in PT  Advance diet Up with therapy Incentive Spirometry Elevate and Apply ice  Weightbearing: WBAT LLE Insicional and dressing care: Dressings left intact until follow-up and Reinforce dressings as needed Orthopedic device(s):  CPM and bone foam Showering: Keep dressing dry VTE prophylaxis: Aspirin '81mg'$  BID  x 30 days post op , SCDs, ambulation Pain control: Tylenol, Oxy, Dilaudid PRN Follow - up plan: 2 weeks Contact information:  Edmonia Lynch MD, Aggie Moats PA-C  Dispo: Patient was supposed to d/c home today but both she and her husband were adament that she is not ready and he is not able to help her enough at home. They said they are absolutely staying another night. I tried to explain that this wasn't our pre-op plan and  that we had multiple long conversations about her d/c plan before surgery and she had no issues then.   I want PT to work hard with the patient so she can go home tomorrow. She also requested HHPT rather than OPPT so I will reach out to the [facility] team for that.     Britt Bottom, PA-C Office [phone] 06/25/2022, 12:46 PM

## 2022-06-25 NOTE — Progress Notes (Signed)
Orthopedic Tech Progress Note Patient Details:  Angela [name] 02/15/1946 [id number]  Patient ID: Angela [name], female   DOB: 1945/06/15, 77 y.o.   MRN: [id number]  [name] 06/25/2022, 5:03 PM Cpm removed and patient leg placed in bone foam

## 2022-06-26 DIAGNOSIS — M1712 Unilateral primary osteoarthritis, left knee: Secondary | ICD-10-CM | POA: Diagnosis not present

## 2022-06-26 MED ORDER — ASPIRIN 81 MG PO TBEC: 81.0000 mg | DELAYED_RELEASE_TABLET | Freq: Two times a day (BID) | ORAL | 0 refills | Status: DC

## 2022-06-26 MED ORDER — DOCUSATE SODIUM 100 MG PO CAPS: 100.0000 mg | ORAL_CAPSULE | Freq: Two times a day (BID) | ORAL | 0 refills | Status: AC | PRN

## 2022-06-26 MED ORDER — METHOCARBAMOL 750 MG PO TABS: 750.0000 mg | ORAL_TABLET | Freq: Three times a day (TID) | ORAL | 0 refills | Status: DC | PRN

## 2022-06-26 MED ORDER — PROCHLORPERAZINE EDISYLATE 10 MG/2ML IJ SOLN
10.0000 mg | Freq: Four times a day (QID) | INTRAMUSCULAR | Status: DC | PRN
  Administered 2022-06-26: 10 mg via INTRAVENOUS
  Filled 2022-06-26: qty 2

## 2022-06-26 MED ORDER — OXYCODONE HCL 5 MG PO TABS: 5.0000 mg | ORAL_TABLET | ORAL | 0 refills | Status: DC | PRN

## 2022-06-26 MED ORDER — CELECOXIB 200 MG PO CAPS: 200.0000 mg | ORAL_CAPSULE | Freq: Two times a day (BID) | ORAL | 0 refills | Status: AC

## 2022-06-26 MED ORDER — ONDANSETRON 4 MG PO TBDP: 4.0000 mg | ORAL_TABLET | Freq: Three times a day (TID) | ORAL | 0 refills | Status: DC | PRN

## 2022-06-26 MED ORDER — ACETAMINOPHEN 500 MG PO TABS: 1000.0000 mg | ORAL_TABLET | Freq: Four times a day (QID) | ORAL | 0 refills | Status: AC | PRN

## 2022-06-26 NOTE — Plan of Care (Signed)
Plan of care reviewed and discussed. °

## 2022-06-26 NOTE — Progress Notes (Addendum)
Physical Therapy Treatment Patient Details Name: Angela [name] MRN: [id number] DOB: 06-30-1945 Today's Date: 06/26/2022   History of Present Illness Pt is a 77yo female presenting s/p L-TKA on 06/24/22. PMH: hx of colon cancer, CHF, fibromyalgia, GERD, HLD, HTN, IBS, MRSA, OSA, RLS, tachycardia, cervical ACDF C3-C5, s/p stent s/p cath, CHF    PT Comments    Pt is progressing well with mobility, she ambulated 120' with RW, no loss of balance. Reviewed TKA HEP, pt demonstrates good understanding. She is ready to DC home from a PT standpoint.    Recommendations for follow up therapy are one component of a multi-disciplinary discharge planning process, led by the attending physician.  Recommendations may be updated based on patient status, additional functional criteria and insurance authorization.  Follow Up Recommendations  Follow physician's recommendations for discharge plan and follow up therapies     Assistance Recommended at Discharge Frequent or constant Supervision/Assistance  Patient can return home with the following A little help with bathing/dressing/bathroom;Assistance with cooking/housework;Assist for transportation;Help with stairs or ramp for entrance   Equipment Recommendations  None recommended by PT    Recommendations for Other Services       Precautions / Restrictions Precautions Precautions: Fall;Knee Precaution Booklet Issued: Yes, reviewed no pillow under knee Precaution Comments: no pillow under the knee Restrictions Weight Bearing Restrictions: No Other Position/Activity Restrictions: wbat     Mobility  Bed Mobility Overal bed mobility: Needs Assistance Bed Mobility: Supine to Sit     Supine to sit: Supervision, HOB elevated     General bed mobility comments: For safety only, no physical assist required    Transfers Overall transfer level: Modified independent Equipment used: Rolling walker (2 wheels) Transfers: Sit to/from Stand Sit to  Stand: Modified independent (Device/Increase time)           General transfer comment: good safety awareness    Ambulation/Gait Ambulation/Gait assistance: Supervision Gait Distance (Feet): 120 Feet Assistive device: Rolling walker (2 wheels) Gait Pattern/deviations: Step-to pattern Gait velocity: decreased     General Gait Details: Pt ambulated with RW  no physical assist required no LOB noted.   Stairs             Wheelchair Mobility    Modified Rankin (Stroke Patients Only)       Balance Overall balance assessment: Modified Independent                                          Cognition Arousal/Alertness: Awake/alert Behavior During Therapy: WFL for tasks assessed/performed Overall Cognitive Status: Within Functional Limits for tasks assessed                                          Exercises Total Joint Exercises Ankle Circles/Pumps: AROM, Both, 10 reps, Supine Quad Sets: AROM, Left, 10 reps Short Arc Quad: AROM, Left, 10 reps Heel Slides: AAROM, Left, 10 reps Hip ABduction/ADduction: AROM, Left, 10 reps Straight Leg Raises: AAROM, Left, 10 reps Long Arc Quad: AROM, Left, 5 reps, Seated Knee Flexion: AAROM, Left, 10 reps, Seated Goniometric ROM: 5-60* AAROM L knee    General Comments        Pertinent Vitals/Pain Pain Assessment Pain Score: 4  Pain Location: L knee Pain Descriptors / Indicators: Operative site guarding Pain Intervention(s):  Limited activity within patient's tolerance, Monitored during session, Premedicated before session, Ice applied    Home Living                          Prior Function            PT Goals (current goals can now be found in the care plan section) Acute Rehab PT Goals Patient Stated Goal: To walk without pain PT Goal Formulation: With patient Time For Goal Achievement: 07/01/22 Potential to Achieve Goals: Good Progress towards PT goals: Progressing toward  goals    Frequency    7X/week      PT Plan Current plan remains appropriate    Co-evaluation              AM-PAC PT "6 Clicks" Mobility   Outcome Measure  Help needed turning from your back to your side while in a flat bed without using bedrails?: None Help needed moving from lying on your back to sitting on the side of a flat bed without using bedrails?: A Little Help needed moving to and from a bed to a chair (including a wheelchair)?: None Help needed standing up from a chair using your arms (e.g., wheelchair or bedside chair)?: None Help needed to walk in hospital room?: None Help needed climbing 3-5 steps with a railing? : A Little 6 Click Score: 22    End of Session Equipment Utilized During Treatment: Gait belt Activity Tolerance: Patient tolerated treatment well;No increased pain Patient left: with call bell/phone within reach;in chair Nurse Communication: Mobility status PT Visit Diagnosis: Pain;Difficulty in walking, not elsewhere classified (R26.2) Pain - Right/Left: Right Pain - part of body: Knee     Time: 1023-1050 PT Time Calculation (min) (ACUTE ONLY): 27 min  Charges:  $Gait Training: 8-22 mins $Therapeutic Exercise: 8-22 mins                     Blondell Reveal Kistler PT 06/26/2022  [place]  Office [phone]

## 2022-06-26 NOTE — Discharge Summary (Signed)
Physician Discharge Summary  Patient ID: Angela [name] MRN: [id number] DOB/AGE: 07-Jun-1945 77 y.o.  Admit date: 06/24/2022 Discharge date: 06/26/2022  Admission Diagnoses: left knee OA  Discharge Diagnoses:  Principal Problem:   S/P total knee arthroplasty, left   Discharged Condition: fair  Hospital Course: Patient underwent a left TKA by Dr. Percell Miller on 6/31/49 without complications and spent 2 nights in the hospital to help with pain control and mobilization. She has passed her PT evaluation and is ready to discharge home with HHPT.   Consults: None  Significant Diagnostic Studies: n/a  Treatments: IV hydration, antibiotics: Ancef, analgesia: acetaminophen, Dilaudid, and Oxycodone, anticoagulation: ASA, therapies: PT and SW, and surgery: left TKA  Discharge Exam: Blood pressure (!) 180/90, pulse 60, temperature 98.5 F (36.9 C), temperature source Oral, resp. rate 16, height '5\' 6"'$  (1.676 m), weight 79 kg, SpO2 98 %. General appearance: alert, cooperative, and no distress Head: Normocephalic, without obvious abnormality, atraumatic Eyes: conjunctivae/corneas clear. PERRL, EOM's intact. Fundi benign. Resp: clear to auscultation bilaterally Cardio: regular rate and rhythm, S1, S2 normal, no murmur, click, rub or gallop Extremities: extremities normal, atraumatic, no cyanosis or edema Pulses:  L brachial 2+ R brachial 2+  L radial 2+ R radial 2+  L inguinal 2+ R inguinal 2+  L popliteal 2+ R popliteal 2+  L posterior tibial 2+ R posterior tibial 2+  L dorsalis pedis 2+ R dorsalis pedis 2+   Neurologic: Grossly normal Incision/Wound: c/d/i  Disposition: Discharge disposition: 01-Home or Self Care       Discharge Instructions     CPM   Complete by: As directed    Continuous passive motion machine (CPM):      Use the CPM from 0 to 90 degrees for 4-6 hours per day.      You may break it up into 2 or 3 sessions per day.      Use CPM for 3 weeks or until you are  told to stop.   Call MD / Call 911   Complete by: As directed    If you experience chest pain or shortness of breath, CALL 911 and be transported to the hospital emergency room.  If you develope a fever above 101 F, pus (white drainage) or increased drainage or redness at the wound, or calf pain, call your surgeon's office.   Diet - low sodium heart healthy   Complete by: As directed    Discharge instructions   Complete by: As directed    You may bear weight as tolerated. Keep your dressing on and dry until follow up. Take medicine to prevent blood clots as directed. Take pain medicine as needed with the goal of transitioning to over the counter medicines.    INSTRUCTIONS AFTER JOINT REPLACEMENT   Remove items at home which could result in a fall. This includes throw rugs or furniture in walking pathways ICE to the affected joint every three hours while awake for 30 minutes at a time, for at least the first 3-5 days, and then as needed for pain and swelling.  Continue to use ice for pain and swelling. You may notice swelling that will progress down to the foot and ankle.  This is normal after surgery.  Elevate your leg when you are not up walking on it.   Continue to use the breathing machine you got in the hospital (incentive spirometer) which will help keep your temperature down.  It is common for your temperature to cycle up and  down following surgery, especially at night when you are not up moving around and exerting yourself.  The breathing machine keeps your lungs expanded and your temperature down.   DIET:  As you were doing prior to hospitalization, we recommend a well-balanced diet.  DRESSING / WOUND CARE / SHOWERING  You may shower 3 days after surgery, but keep the wounds dry during showering.  You may use an occlusive plastic wrap (Press'n Seal for example) with blue painter's tape at edges, NO SOAKING/SUBMERGING IN THE BATHTUB.  If the bandage gets wet, call the office.    ACTIVITY  Increase activity slowly as tolerated, but follow the weight bearing instructions below.   No driving for 6 weeks or until further direction given by your physician.  You cannot drive while taking narcotics.  No lifting or carrying greater than 10 lbs. until further directed by your surgeon. Avoid periods of inactivity such as sitting longer than an hour when not asleep. This helps prevent blood clots.  You may return to work once you are authorized by your doctor.    WEIGHT BEARING   Weight bearing as tolerated with assist device (walker, cane, etc) as directed, use it as long as suggested by your surgeon or therapist, typically at least 4-6 weeks.   EXERCISES  Results after joint replacement surgery are often greatly improved when you follow the exercise, range of motion and muscle strengthening exercises prescribed by your doctor. Safety measures are also important to protect the joint from further injury. Any time any of these exercises cause you to have increased pain or swelling, decrease what you are doing until you are comfortable again and then slowly increase them. If you have problems or questions, call your caregiver or physical therapist for advice.   Rehabilitation is important following a joint replacement. After just a few days of immobilization, the muscles of the leg can become weakened and shrink (atrophy).  These exercises are designed to build up the tone and strength of the thigh and leg muscles and to improve motion. Often times heat used for twenty to thirty minutes before working out will loosen up your tissues and help with improving the range of motion but do not use heat for the first two weeks following surgery (sometimes heat can increase post-operative swelling).   These exercises can be done on a training (exercise) mat, on the floor, on a table or on a bed. Use whatever works the best and is most comfortable for you.    Use music or television while  you are exercising so that the exercises are a pleasant break in your day. This will make your life better with the exercises acting as a break in your routine that you can look forward to.   Perform all exercises about fifteen times, three times per day or as directed.  You should exercise both the operative leg and the other leg as well.  Exercises include:   Quad Sets - Tighten up the muscle on the front of the thigh (Quad) and hold for 5-10 seconds.   Straight Leg Raises - With your knee straight (if you were given a brace, keep it on), lift the leg to 60 degrees, hold for 3 seconds, and slowly lower the leg.  Perform this exercise against resistance later as your leg gets stronger.  Leg Slides: Lying on your back, slowly slide your foot toward your buttocks, bending your knee up off the floor (only go as far as is comfortable).  Then slowly slide your foot back down until your leg is flat on the floor again.  Angel Wings: Lying on your back spread your legs to the side as far apart as you can without causing discomfort.  Hamstring Strength:  Lying on your back, push your heel against the floor with your leg straight by tightening up the muscles of your buttocks.  Repeat, but this time bend your knee to a comfortable angle, and push your heel against the floor.  You may put a pillow under the heel to make it more comfortable if necessary.   A rehabilitation program following joint replacement surgery can speed recovery and prevent re-injury in the future due to weakened muscles. Contact your doctor or a physical therapist for more information on knee rehabilitation.    CONSTIPATION  Constipation is defined medically as fewer than three stools per week and severe constipation as less than one stool per week.  Even if you have a regular bowel pattern at home, your normal regimen is likely to be disrupted due to multiple reasons following surgery.  Combination of anesthesia, postoperative narcotics,  change in appetite and fluid intake all can affect your bowels.   YOU MUST use at least one of the following options; they are listed in order of increasing strength to get the job done.  They are all available over the counter, and you may need to use some, POSSIBLY even all of these options:    Drink plenty of fluids (prune juice may be helpful) and high fiber foods Colace 100 mg by mouth twice a day  Senokot for constipation as directed and as needed Dulcolax (bisacodyl), take with full glass of water  Miralax (polyethylene glycol) once or twice a day as needed.  If you have tried all these things and are unable to have a bowel movement in the first 3-4 days after surgery call either your surgeon or your primary doctor.    If you experience loose stools or diarrhea, hold the medications until you stool forms back up.  If your symptoms do not get better within 1 week or if they get worse, check with your doctor.  If you experience "the worst abdominal pain ever" or develop nausea or vomiting, please contact the office immediately for further recommendations for treatment.   ITCHING:  If you experience itching with your medications, try taking only a single pain pill, or even half a pain pill at a time.  You can also use Benadryl over the counter for itching or also to help with sleep.   TED HOSE STOCKINGS:  Use stockings on both legs until for at least 2 weeks or as directed by physician office. They may be removed at night for sleeping.  MEDICATIONS:  See your medication summary on the "After Visit Summary" that nursing will review with you.  You may have some home medications which will be placed on hold until you complete the course of blood thinner medication.  It is important for you to complete the blood thinner medication as prescribed.  Take medicines as prescribed.   You have several different medicines that work in different ways. - Tylenol is for mild to moderate pain. Try to take  this medicine before turning to your narcotic medicines.  - Celebrex is to reduce pain / inflammation - Robaxin is for muscle spasms. This medicine can make you drowsy. - Oxycodone is a narcotic pain medicine.  Take this for severe pain. This medicine can be dehydrating /  constipating. - Zofran is for nausea and vomiting. - Colace is for constipation prevention  - Aspirin is to prevent blood clots after surgery. YOU MUST TAKE THIS MEDICINE!  PRECAUTIONS:  If you experience chest pain or shortness of breath - call 911 immediately for transfer to the hospital emergency department.   If you develop a fever greater that 101 F, purulent drainage from wound, increased redness or drainage from wound, foul odor from the wound/dressing, or calf pain - CONTACT YOUR SURGEON.                                                   FOLLOW-UP APPOINTMENTS:  If you do not already have a post-op appointment, please call the office [phone] for an appointment to be seen by Dr. Percell Miller in 2 weeks.   OTHER INSTRUCTIONS:   MAKE SURE YOU:  Understand these instructions.  Get help right away if you are not doing well or get worse.    Thank you for letting us be a part of your medical care team.  It is a privilege we respect greatly.  We hope these instructions will help you stay on track for a fast and full recovery!   Do not put a pillow under the knee. Place it under the heel.   Complete by: As directed    Driving restrictions   Complete by: As directed    No driving for 2-4 weeks   Post-operative opioid taper instructions:   Complete by: As directed    POST-OPERATIVE OPIOID TAPER INSTRUCTIONS: It is important to wean off of your opioid medication as soon as possible. If you do not need pain medication after your surgery it is ok to stop day one. Opioids include: Codeine, Hydrocodone(Norco, Vicodin), Oxycodone(Percocet, oxycontin) and hydromorphone amongst others.  Long term and even short term use of  opiods can cause: Increased pain response Dependence Constipation Depression Respiratory depression And more.  Withdrawal symptoms can include Flu like symptoms Nausea, vomiting And more Techniques to manage these symptoms Hydrate well Eat regular healthy meals Stay active Use relaxation techniques(deep breathing, meditating, yoga) Do Not substitute Alcohol to help with tapering If you have been on opioids for less than two weeks and do not have pain than it is ok to stop all together.  Plan to wean off of opioids This plan should start within one week post op of your joint replacement. Maintain the same interval or time between taking each dose and first decrease the dose.  Cut the total daily intake of opioids by one tablet each day Next start to increase the time between doses. The last dose that should be eliminated is the evening dose.      TED hose   Complete by: As directed    Use stockings (TED hose) for 2 weeks on left leg(s).  You may remove them at night for sleeping.   Weight bearing as tolerated   Complete by: As directed       Allergies as of 06/26/2022       Reactions   Phenergan [promethazine] Other (See Comments)   Restlessness   Timentin [ticarcillin-pot Clavulanate] Anaphylaxis   Has patient had a PCN reaction causing immediate rash, facial/tongue/throat swelling, SOB or lightheadedness with hypotension: Yes Has patient had a PCN reaction causing severe rash involving mucus membranes or skin necrosis:  Yes Has patient had a PCN reaction that required hospitalization Yes Has patient had a PCN reaction occurring within the last 10 years: No If all of the above answers are "NO", then may proceed with Cephalosporin use.   Pregabalin Other (See Comments)   Mouth sores   Norvasc [amlodipine] Swelling   Reglan [metoclopramide] Other (See Comments)   Cannot sit still        Medication List     STOP taking these medications    ibuprofen 200 MG  tablet Commonly known as: ADVIL   tiZANidine 4 MG tablet Commonly known as: ZANAFLEX       TAKE these medications    acetaminophen 500 MG tablet Commonly known as: TYLENOL Take 2 tablets (1,000 mg total) by mouth every 6 (six) hours as needed for mild pain or moderate pain.   aspirin EC 81 MG tablet Take 1 tablet (81 mg total) by mouth 2 (two) times daily. To prevent blood clots for 30 days after surgery. What changed:  when to take this additional instructions   B-12 2500 MCG Tabs Take 2,500 mcg by mouth daily.   celecoxib 200 MG capsule Commonly known as: CeleBREX Take 1 capsule (200 mg total) by mouth 2 (two) times daily for 14 days. For 2 weeks post op for pain and inflammation.  Discontinue Ibuprofen or other Anti-inflammatory medicine when taking this medicine.   cetirizine 10 MG tablet Commonly known as: ZYRTEC Take 10 mg by mouth daily.   docusate sodium 100 MG capsule Commonly known as: Colace Take 1 capsule (100 mg total) by mouth 2 (two) times daily as needed for mild constipation or moderate constipation.   DULoxetine 20 MG capsule Commonly known as: CYMBALTA Take 20 mg by mouth 2 (two) times daily.   Entresto 97-103 MG Generic drug: sacubitril-valsartan TAKE 1 TABLET TWICE A DAY   escitalopram 10 MG tablet Commonly known as: LEXAPRO Take 10 mg by mouth at bedtime.   esomeprazole 40 MG capsule Commonly known as: NEXIUM Take 40 mg by mouth daily.   fluticasone 50 MCG/ACT nasal spray Commonly known as: FLONASE Place 2 sprays into both nostrils daily as needed for allergies (seasonal allergies).   hydrOXYzine 50 MG tablet Commonly known as: ATARAX Take 50 mg by mouth at bedtime.   isosorbide mononitrate 60 MG 24 hr tablet Commonly known as: IMDUR Take 1.5 tablets (90 mg total) by mouth daily.   levorphanol 2 MG tablet Commonly known as: LEVODROMORAN Take 2 mg by mouth 3 (three) times daily.   methocarbamol 750 MG tablet Commonly known as:  Robaxin-750 Take 1 tablet (750 mg total) by mouth every 8 (eight) hours as needed for muscle spasms.   metoprolol succinate 100 MG 24 hr tablet Commonly known as: TOPROL-XL TAKE 1 TABLET TWICE A DAY,TAKE WITH OR IMMEDIATELY FOLLOWING A MEAL (DISCONTINUE METOPROLOL TARTRATE)   nitroGLYCERIN 0.4 MG SL tablet Commonly known as: NITROSTAT DISSOLVE 1 TABLET UNDER THE TONGUE EVERY 5 MINUTES AS NEEDED FOR CHEST PAIN, AS DIRECTED.   ondansetron 4 MG disintegrating tablet Commonly known as: ZOFRAN-ODT Take 1 tablet (4 mg total) by mouth every 8 (eight) hours as needed for nausea or vomiting.   ondansetron 4 MG tablet Commonly known as: ZOFRAN Take 4 mg by mouth every 8 (eight) hours as needed for nausea or vomiting.   oxyCODONE 5 MG immediate release tablet Commonly known as: Roxicodone Take 1 tablet (5 mg total) by mouth every 4 (four) hours as needed for severe pain (after  surgery that is not resolved by your normal daily dose of Levorphanol).   Repatha SureClick 903 MG/ML Soaj Generic drug: Evolocumab INJECT 1 PEN UNDER THE SKIN EVERY 14 DAYS   rOPINIRole 1 MG tablet Commonly known as: REQUIP Take 1-2 mg by mouth See admin instructions. Take 1 mg in the afternoon and 2 mg at night   rosuvastatin 10 MG tablet Commonly known as: CRESTOR Take 1 tablet (10 mg total) by mouth at bedtime.   spironolactone 25 MG tablet Commonly known as: ALDACTONE TAKE 1 TABLET DAILY   VITAMIN D PO Take 1 capsule by mouth daily.               Discharge Care Instructions  (From admission, onward)           Start     Ordered   06/26/22 0000  Weight bearing as tolerated        06/26/22 1325            Follow-up Information     Renette Butters, MD. Go on 07/09/2022.   Specialty: Orthopedic Surgery Why: your appoinment is scheduled for 4:15. Contact information: [address] [place] [address] [phone]         Care, [facility] Follow  up.   Specialty: Home Health Services Why: Alvis Lemmings will provide PT in the home after discharge. Contact information: [place] [address] [phone]                 Signed: Britt Bottom PA-C 06/26/2022, 1:29 PM

## 2022-06-26 NOTE — Plan of Care (Signed)
Pt ready to DC home with husband 

## 2022-06-27 ENCOUNTER — Emergency Department (HOSPITAL_COMMUNITY)
Admission: EM | Admit: 2022-06-27 | Discharge: 2022-06-27 | Disposition: A | Discharge status: Home / Self Care | Payer: Medicare Other | Attending: Emergency Medicine | Admitting: Emergency Medicine

## 2022-06-27 ENCOUNTER — Emergency Department (HOSPITAL_COMMUNITY): Payer: Medicare Other

## 2022-06-27 ENCOUNTER — Encounter (HOSPITAL_COMMUNITY): Payer: Self-pay | Admitting: Emergency Medicine

## 2022-06-27 DIAGNOSIS — M25562 Pain in left knee: Secondary | ICD-10-CM | POA: Diagnosis not present

## 2022-06-27 DIAGNOSIS — I110 Hypertensive heart disease with heart failure: Secondary | ICD-10-CM | POA: Insufficient documentation

## 2022-06-27 DIAGNOSIS — R55 Syncope and collapse: Secondary | ICD-10-CM | POA: Diagnosis present

## 2022-06-27 DIAGNOSIS — Z85038 Personal history of other malignant neoplasm of large intestine: Secondary | ICD-10-CM | POA: Insufficient documentation

## 2022-06-27 DIAGNOSIS — Z7982 Long term (current) use of aspirin: Secondary | ICD-10-CM | POA: Insufficient documentation

## 2022-06-27 DIAGNOSIS — Z955 Presence of coronary angioplasty implant and graft: Secondary | ICD-10-CM | POA: Insufficient documentation

## 2022-06-27 DIAGNOSIS — Z96652 Presence of left artificial knee joint: Secondary | ICD-10-CM | POA: Diagnosis not present

## 2022-06-27 DIAGNOSIS — R42 Dizziness and giddiness: Secondary | ICD-10-CM | POA: Diagnosis not present

## 2022-06-27 DIAGNOSIS — I509 Heart failure, unspecified: Secondary | ICD-10-CM | POA: Insufficient documentation

## 2022-06-27 DIAGNOSIS — Z79899 Other long term (current) drug therapy: Secondary | ICD-10-CM | POA: Insufficient documentation

## 2022-06-27 DIAGNOSIS — I2510 Atherosclerotic heart disease of native coronary artery without angina pectoris: Secondary | ICD-10-CM | POA: Diagnosis not present

## 2022-06-27 LAB — BASIC METABOLIC PANEL
Anion gap: 8 (ref 5–15)
BUN: 23 mg/dL (ref 8–23)
CO2: 26 mmol/L (ref 22–32)
Calcium: 7.5 mg/dL — ABNORMAL LOW (ref 8.9–10.3)
Chloride: 108 mmol/L (ref 98–111)
Creatinine, Ser: 1.2 mg/dL — ABNORMAL HIGH (ref 0.44–1.00)
GFR, Estimated: 47 mL/min — ABNORMAL LOW (ref >60)
Glucose, Bld: 101 mg/dL — ABNORMAL HIGH (ref 70–99)
Potassium: 3.9 mmol/L (ref 3.5–5.1)
Sodium: 142 mmol/L (ref 135–145)

## 2022-06-27 LAB — CBC WITH DIFFERENTIAL/PLATELET
Abs Immature Granulocytes: 0.06 10*3/uL (ref 0.00–0.07)
Basophils Absolute: 0 10*3/uL (ref 0.0–0.1)
Basophils Relative: 0 %
Eosinophils Absolute: 0.1 10*3/uL (ref 0.0–0.5)
Eosinophils Relative: 1 %
HCT: 35.1 % — ABNORMAL LOW (ref 36.0–46.0)
Hemoglobin: 11.4 g/dL — ABNORMAL LOW (ref 12.0–15.0)
Immature Granulocytes: 1 %
Lymphocytes Relative: 33 %
Lymphs Abs: 3.1 10*3/uL (ref 0.7–4.0)
MCH: 29.8 pg (ref 26.0–34.0)
MCHC: 32.5 g/dL (ref 30.0–36.0)
MCV: 91.6 fL (ref 80.0–100.0)
Monocytes Absolute: 0.8 10*3/uL (ref 0.1–1.0)
Monocytes Relative: 9 %
Neutro Abs: 5.5 10*3/uL (ref 1.7–7.7)
Neutrophils Relative %: 56 %
Platelets: 235 10*3/uL (ref 150–400)
RBC: 3.83 MIL/uL — ABNORMAL LOW (ref 3.87–5.11)
RDW: 14 % (ref 11.5–15.5)
WBC: 9.6 10*3/uL (ref 4.0–10.5)
nRBC: 0 % (ref 0.0–0.2)

## 2022-06-27 MED ORDER — OXYCODONE HCL 5 MG PO TABS
5.0000 mg | ORAL_TABLET | Freq: Once | ORAL | Status: AC
  Administered 2022-06-27: 5 mg via ORAL
  Filled 2022-06-27: qty 1

## 2022-06-27 MED ORDER — SODIUM CHLORIDE 0.9 % IV SOLN: Freq: Once | INTRAVENOUS | Status: AC

## 2022-06-27 NOTE — ED Notes (Signed)
Lab contacted re: delayed results. They have specimen, but was not officially received previously. Receiving it now, and running. Results pending.

## 2022-06-27 NOTE — ED Notes (Signed)
EDP Dr. Pearline Cables at [facility]. PT alert, NAD, calm, interactive.

## 2022-06-27 NOTE — ED Provider Notes (Signed)
[facility] Provider Note  CSN: [id number] Arrival date & time: 06/27/22 1044  Chief Complaint(s) Hypotension  HPI Angela [name] is a 77 y.o. female with past medical history as below, significant for colon cancer, CHF, HLD, HTN, IBS, RLS, MRSA septicemia who presents to the ED with complaint of low blood pressure.  Patient is status post left total knee Dr. Percell Miller 1/23.  She was just discharged yesterday.  She was started on oral opiates, last took her Roxicodone around 4 AM.  She has not eaten much of anything since being discharged yesterday.  Physical therapy attempted to get up from the bed this morning to start PT, she felt lightheaded, started " seeing stars," had brief LOC.  Reports similar episode when she had her right knee done 10 or so years ago.  She was given 2 L fluid bolus by EMS, she was hypotensive initially but normotensive after fluid bolus.  At this time feels back to her baseline other than left knee pain.  No chest pain or dyspnea, no headaches, no fevers or chills, no vomiting or diarrhea.  Tolerant p.o. intake without difficulty.  Requesting her home pain medication for her left knee.  Past Medical History Past Medical History:  Diagnosis Date   Anxiety    Cancer of sigmoid ([place])    CHF (congestive heart failure) (HCC)    Depression (emotion)    DJD (degenerative joint disease)    Fibromyalgia    GERD (gastroesophageal reflux disease)    Hemorrhoids    Hyperlipidemia    Hypertension    IBS (irritable bowel syndrome)    MRSA (methicillin resistant Staphylococcus aureus) septicemia ([place])    history of   OSA    stopped CPAP, wants to have ASPIRE   Over weight    Pancreatitis    Pneumonia    PONV (postoperative nausea and vomiting)    RLS (restless legs syndrome)    Tachycardia    Patient Active Problem List   Diagnosis Date Noted   S/P total knee arthroplasty, left 06/24/2022   OSA on CPAP 07/16/2021    Unstable angina (HCC)    Intractable vomiting with nausea 11/30/2018   AKI (acute kidney injury) ([place]) 11/30/2018   Exertional dyspnea 08/19/2017   Accelerated hypertension 05/31/2017   Elevated lactic acid level 05/31/2017   Headache 05/31/2017   Constipation 05/31/2017   Hypokalemia 05/31/2017   Fibromyalgia 05/29/2017   Dehydration 05/16/2016   Muscle ache of extremity 02/20/2016   CAD (coronary artery disease) 01/08/2016   Abnormal stress test 12/12/2015   Chest pain 11/27/2015   Family history of early CAD 11/27/2015   Hyperlipidemia 11/27/2015   Home Medication(s) Prior to Admission medications   Medication Sig Start Date End Date Taking? Authorizing Provider  acetaminophen (TYLENOL) 500 MG tablet Take 2 tablets (1,000 mg total) by mouth every 6 (six) hours as needed for mild pain or moderate pain. 06/26/22   Britt Bottom, PA-C  aspirin EC 81 MG tablet Take 1 tablet (81 mg total) by mouth 2 (two) times daily. To prevent blood clots for 30 days after surgery. 06/26/22   Britt Bottom, PA-C  celecoxib (CELEBREX) 200 MG capsule Take 1 capsule (200 mg total) by mouth 2 (two) times daily for 14 days. For 2 weeks post op for pain and inflammation.  Discontinue Ibuprofen or other Anti-inflammatory medicine when taking this medicine. 06/26/22 07/10/22  Britt Bottom, PA-C  cetirizine (ZYRTEC) 10 MG tablet  Take 10 mg by mouth daily.    [provider]  Cyanocobalamin (B-12) 2500 MCG TABS Take 2,500 mcg by mouth daily.    [provider]  docusate sodium (COLACE) 100 MG capsule Take 1 capsule (100 mg total) by mouth 2 (two) times daily as needed for mild constipation or moderate constipation. 06/26/22 06/26/23  Britt Bottom, PA-C  DULoxetine (CYMBALTA) 20 MG capsule Take 20 mg by mouth 2 (two) times daily.  03/26/17   [provider]  ENTRESTO 97-103 MG TAKE 1 TABLET TWICE A DAY 06/06/21   Larey Dresser, MD  escitalopram (LEXAPRO) 10 MG tablet Take 10 mg by  mouth at bedtime. 11/18/18   [provider]  esomeprazole (NEXIUM) 40 MG capsule Take 40 mg by mouth daily.    [provider]  fluticasone (FLONASE) 50 MCG/ACT nasal spray Place 2 sprays into both nostrils daily as needed for allergies (seasonal allergies).     [provider]  hydrOXYzine (ATARAX) 50 MG tablet Take 50 mg by mouth at bedtime. 02/14/22   [provider]  isosorbide mononitrate (IMDUR) 60 MG 24 hr tablet Take 1.5 tablets (90 mg total) by mouth daily. 02/07/22   Larey Dresser, MD  levorphanol (LEVODROMORAN) 2 MG tablet Take 2 mg by mouth 3 (three) times daily.    [provider]  methocarbamol (ROBAXIN-750) 750 MG tablet Take 1 tablet (750 mg total) by mouth every 8 (eight) hours as needed for muscle spasms. 06/26/22   Britt Bottom, PA-C  metoprolol succinate (TOPROL-XL) 100 MG 24 hr tablet TAKE 1 TABLET TWICE A DAY,TAKE WITH OR IMMEDIATELY FOLLOWING A MEAL (DISCONTINUE METOPROLOL TARTRATE) 06/05/22   Larey Dresser, MD  nitroGLYCERIN (NITROSTAT) 0.4 MG SL tablet DISSOLVE 1 TABLET UNDER THE TONGUE EVERY 5 MINUTES AS NEEDED FOR CHEST PAIN, AS DIRECTED. 10/17/21   Larey Dresser, MD  ondansetron (ZOFRAN) 4 MG tablet Take 4 mg by mouth every 8 (eight) hours as needed for nausea or vomiting.    [provider]  ondansetron (ZOFRAN-ODT) 4 MG disintegrating tablet Take 1 tablet (4 mg total) by mouth every 8 (eight) hours as needed for nausea or vomiting. 06/26/22   Britt Bottom, PA-C  oxyCODONE (ROXICODONE) 5 MG immediate release tablet Take 1 tablet (5 mg total) by mouth every 4 (four) hours as needed for severe pain (after surgery that is not resolved by your normal daily dose of Levorphanol). 06/26/22   Britt Bottom, PA-C  REPATHA SURECLICK 397 MG/ML SOAJ INJECT 1 PEN UNDER THE SKIN EVERY 14 DAYS 06/05/22   Larey Dresser, MD  rOPINIRole (REQUIP) 1 MG tablet Take 1-2 mg by mouth See admin instructions. Take 1 mg in the afternoon  and 2 mg at night    [provider]  rosuvastatin (CRESTOR) 10 MG tablet Take 1 tablet (10 mg total) by mouth at bedtime. 09/02/21   Sueanne Margarita, MD  spironolactone (ALDACTONE) 25 MG tablet TAKE 1 TABLET DAILY 09/02/21   Larey Dresser, MD  VITAMIN D PO Take 1 capsule by mouth daily.    [provider]  Past Surgical History Past Surgical History:  Procedure Laterality Date   ABDOMINAL HYSTERECTOMY     APPENDECTOMY     CARDIAC CATHETERIZATION [name] 12/26/2015   Procedure: Left Heart Cath and Coronary Angiography;  Surgeon: Troy Sine, MD;  Location: [facility];  Service: Cardiovascular;  Laterality: [name];   CERVICAL SPINE SURGERY     has had 5 different surgeries including ACDF  C3-5   child birth     CHOLECYSTECTOMY     COLON SURGERY     resection due to colon Ca, no ostomy   CORONARY STENT INTERVENTION [name] 10/28/2019   Procedure: CORONARY STENT INTERVENTION;  Surgeon: Burnell Blanks, MD;  Location: [facility];  Service: Cardiovascular;  Laterality: [name];   EYE SURGERY Bilateral    cataracts removed, lens implanted   JOINT REPLACEMENT Right 2012   by Dr. Noemi Chapel   LEFT HEART CATH AND CORONARY ANGIOGRAPHY [name] 10/28/2019   Procedure: LEFT HEART CATH AND CORONARY ANGIOGRAPHY;  Surgeon: Burnell Blanks, MD;  Location: [facility];  Service: Cardiovascular;  Laterality: [name];   LEFT OOPHORECTOMY     for torsion   RIGHT/LEFT HEART CATH AND CORONARY ANGIOGRAPHY [name] 02/07/2022   Procedure: RIGHT/LEFT HEART CATH AND CORONARY ANGIOGRAPHY;  Surgeon: Larey Dresser, MD;  Location: [facility];  Service: Cardiovascular;  Laterality: [name];   TONSILLECTOMY     TOTAL KNEE ARTHROPLASTY Left 06/24/2022   Procedure: TOTAL KNEE ARTHROPLASTY;  Surgeon: Renette Butters, MD;  Location: [facility];  Service:  Orthopedics;  Laterality: Left;   TUBAL LIGATION     Family History Family History  Problem Relation Age of Onset   Hypertension Mother    Heart disease Mother    Heart attack Father    Stroke Father    Diabetes Sister    Hypertension Sister    Stroke Other    Diabetes Other    Heart disease Other     Social History Social History   Tobacco Use   Smoking status: Never   Smokeless tobacco: Never  Vaping Use   Vaping Use: Never used  Substance Use Topics   Alcohol use: No    Alcohol/week: 0.0 standard drinks of alcohol   Drug use: No   Allergies Phenergan [promethazine], Timentin [ticarcillin-pot clavulanate], Pregabalin, Norvasc [amlodipine], and Reglan [metoclopramide]  Review of Systems Review of Systems  Constitutional:  Negative for activity change and fever.  HENT:  Negative for facial swelling and trouble swallowing.   Eyes:  Negative for discharge and redness.  Respiratory:  Negative for cough and shortness of breath.   Cardiovascular:  Negative for chest pain and palpitations.  Gastrointestinal:  Negative for abdominal pain and nausea.  Genitourinary:  Negative for dysuria and flank pain.  Musculoskeletal:  Positive for arthralgias. Negative for back pain and gait problem.  Skin:  Negative for pallor and rash.  Neurological:  Positive for syncope and light-headedness. Negative for headaches.    Physical Exam Vital Signs  I have reviewed the triage vital signs BP 136/77   Pulse 73   Temp 98.3 F (36.8 C) (Oral)   Resp 18   SpO2 95%  Physical Exam Vitals and nursing note reviewed.  Constitutional:      General: She is not in acute distress.    Appearance: Normal appearance.  HENT:     Head: Normocephalic and atraumatic.     Right Ear: External ear normal.     Left Ear: External  ear normal.     Nose: Nose normal.     Mouth/Throat:     Mouth: Mucous membranes are moist.  Eyes:     General: No scleral icterus.       Right eye: No discharge.         Left eye: No discharge.  Cardiovascular:     Rate and Rhythm: Normal rate and regular rhythm.     Pulses: Normal pulses.     Heart sounds: Normal heart sounds.     No S3 or S4 sounds.  Pulmonary:     Effort: Pulmonary effort is normal. No respiratory distress.     Breath sounds: Normal breath sounds.  Abdominal:     General: Abdomen is flat.     Tenderness: There is no abdominal tenderness.  Musculoskeletal:        General: Normal range of motion.     Cervical back: No rigidity.     Right lower leg: No edema.     Left lower leg: No edema.       Legs:     Comments: Lower extremities are neurovascularly intact bilateral  Skin:    General: Skin is warm and dry.     Capillary Refill: Capillary refill takes less than 2 seconds.  Neurological:     Mental Status: She is alert and oriented to person, place, and time.     GCS: GCS eye subscore is 4. GCS verbal subscore is 5. GCS motor subscore is 6.  Psychiatric:        Mood and Affect: Mood normal.        Behavior: Behavior normal.     ED Results and Treatments Labs (all labs ordered are listed, but only abnormal results are displayed) Labs Reviewed  CBC WITH DIFFERENTIAL/PLATELET - Abnormal; Notable for the following components:      Result Value   RBC 3.83 (*)    Hemoglobin 11.4 (*)    HCT 35.1 (*)    All other components within normal limits  BASIC METABOLIC PANEL - Abnormal; Notable for the following components:   Glucose, Bld 101 (*)    Creatinine, Ser 1.20 (*)    Calcium 7.5 (*)    GFR, Estimated 47 (*)    All other components within normal limits                                                                                                                          Radiology DG Chest Portable 1 View  Result Date: 06/27/2022 CLINICAL DATA:  Provided history: Syncope. CHF. EXAM: PORTABLE CHEST 1 VIEW COMPARISON:  Prior chest radiographs 05/29/2017 and earlier. FINDINGS: Shallow inspiration radiograph,  accentuating cardiac silhouette and limiting evaluation of heart size. Chronic elevation of the right hemidiaphragm. No appreciable airspace consolidation or pulmonary edema. No evidence of pleural effusion or pneumothorax. Postsurgical changes to the cervical spine, incompletely imaged and incompletely assessed. IMPRESSION: 1. No evidence of acute cardiopulmonary abnormality. 2. Chronic elevation  of the right hemidiaphragm. Electronically Signed   By: Kellie Simmering D.O.   On: 06/27/2022 11:21    Pertinent labs & imaging results that were available during my care of the patient were reviewed by me and considered in my medical decision making (see MDM for details).  Medications Ordered in ED Medications  oxyCODONE (Oxy IR/ROXICODONE) immediate release tablet 5 mg (5 mg Oral Given 06/27/22 1120)  0.9 %  sodium chloride infusion (0 mLs Intravenous Stopped 06/27/22 1251)                                                                                                                                     Procedures Procedures  (including critical care time)  Medical Decision Making / ED Course   MDM:  Jarissa Sheriff is a 77 y.o. female with past medical history as below, significant for colon cancer, CHF, HLD, HTN, IBS, RLS, MRSA septicemia who presents to the ED with complaint of low blood pressure, syncope.. The complaint involves an extensive differential diagnosis and also carries with it a high risk of complications and morbidity.  Serious etiology was considered. Ddx includes but is not limited to: Orthostatic hypotension, vasovagal, dysrhythmia, metabolic, medication effect, etc.  On initial assessment the patient is: Resting comfortably, no acute distress.  Screening labs and imaging.  EKG.  Continue IV fluid infusion and give patient her oral Roxie. Vital signs and nursing notes were reviewed  Clinical Course as of 06/27/22 1507  Fri Jun 27, 2022  1305 Feeling much better, currently  asymptomatic other than her left knee pain which is improved with roxi [SG]  [place] syncope rule low risk [SG]    Clinical Course User Index [SG] Jeanell Sparrow, DO   Labs reviewed are stable, Cr is at baseline, Hgb is slightly low, she had recent total knee chest x-ray stable, EKG unremarkable.  Patient ports she is feeling back to her baseline, she is time p.o. intake difficulty.  She is ambulatory her typical gait with recent knee surgery.  Spouse at bedside will take her home.  Low risk [place] syncope score.    Patient presents with syncopal symptoms without worrisome features. Presentation most suggestive of neuro-cardiogenic or orthostatic cause. Very low suspicion for serious arrhythmia, cardiac ischemia or other serious etiology. ECG reviewed, no evidence of a cardiac arrhythmia such as [place], WPW, HOCM, IHSS, Long or short QT. Neurologic exam is nonfocal, not consistent with CVA or primary neurologic abnormality. Patient appears safe for discharge with outpatient observation and close PCP F/U. Syncope warnings discussed with patient. The patient has been instructed to return immediately if the symptoms worsen in any way. Patient verbalized understanding and is in agreement with current care plan. All questions answered prior to discharge.  The patient improved significantly and was discharged in stable condition. Detailed discussions were had with the patient regarding current findings, and need for  close f/u with PCP or on call doctor. The patient has been instructed to return immediately if the symptoms worsen in any way for re-evaluation. Patient verbalized understanding and is in agreement with current care plan. All questions answered prior to discharge.    Additional history obtained: -Additional history obtained from na -External records from outside source obtained and reviewed including: Chart review including previous notes, labs, imaging, consultation notes  including recent admission, prior labs and imaging, prior heart cath Dr. Aundra Dubin 9/23> patent RCA stent, 50% proximal LAD stenosis, 80% ostial stenosis, no intervention at that time. Prior echocardiogram 3/23 with EF 63%, no diastolic dysfunction    Lab Tests: -I ordered, reviewed, and interpreted labs.   The pertinent results include:   Labs Reviewed  CBC WITH DIFFERENTIAL/PLATELET - Abnormal; Notable for the following components:      Result Value   RBC 3.83 (*)    Hemoglobin 11.4 (*)    HCT 35.1 (*)    All other components within normal limits  BASIC METABOLIC PANEL - Abnormal; Notable for the following components:   Glucose, Bld 101 (*)    Creatinine, Ser 1.20 (*)    Calcium 7.5 (*)    GFR, Estimated 47 (*)    All other components within normal limits    Notable for labs stable, cr similar to baseline   EKG   EKG Interpretation  Date/Time:  Friday June 27 2022 10:57:55 EST Ventricular Rate:  68 PR Interval:  125 QRS Duration: 102 QT Interval:  449 QTC Calculation: 478 R Axis:   -40 Text Interpretation: Sinus rhythm Incomplete RBBB and LAFB Low voltage, precordial leads RSR' in V1 or V2, right VCD or RVH no stemi similar to prior Confirmed by Wynona Dove ([id number]) on 06/27/2022 2:21:57 PM         Imaging Studies ordered: I ordered imaging studies including CXR I independently visualized the following imaging with scope of interpretation limited to determining acute life threatening conditions related to emergency care: CXR neg I independently visualized and interpreted imaging. I agree with the radiologist interpretation   Medicines ordered and prescription drug management: Meds ordered this encounter  Medications   oxyCODONE (Oxy IR/ROXICODONE) immediate release tablet 5 mg   0.9 %  sodium chloride infusion    -I have reviewed the patients home medicines and have made adjustments as needed   Consultations Obtained: na   Cardiac Monitoring: The patient  was maintained on a cardiac monitor.  I personally viewed and interpreted the cardiac monitored which showed an underlying rhythm of: NSR  Social Determinants of Health:  Diagnosis or treatment significantly limited by social determinants of health: non smoker, lives with spouse   Reevaluation: After the interventions noted above, I reevaluated the patient and found that they have resolved  Co morbidities that complicate the patient evaluation  Past Medical History:  Diagnosis Date   Anxiety    Cancer of sigmoid ([place])    CHF (congestive heart failure) (HCC)    Depression (emotion)    DJD (degenerative joint disease)    Fibromyalgia    GERD (gastroesophageal reflux disease)    Hemorrhoids    Hyperlipidemia    Hypertension    IBS (irritable bowel syndrome)    MRSA (methicillin resistant Staphylococcus aureus) septicemia ([place])    history of   OSA    stopped CPAP, wants to have ASPIRE   Over weight    Pancreatitis    Pneumonia    PONV (postoperative nausea and vomiting)  RLS (restless legs syndrome)    Tachycardia       Dispostion: Disposition decision including need for hospitalization was considered, and patient discharged from emergency department.    Final Clinical Impression(s) / ED Diagnoses Final diagnoses:  Syncope, unspecified syncope type     This chart was dictated using voice recognition software.  Despite best efforts to proofread,  errors can occur which can change the documentation meaning.    Wynona Dove A, DO 06/27/22 1507

## 2022-06-27 NOTE — ED Notes (Signed)
Xray at [facility]. Family present.

## 2022-06-27 NOTE — ED Notes (Signed)
Tolerating PO fluids. Pending void/ urine sample, Pure wick in place. Husband at [facility].

## 2022-06-27 NOTE — Discharge Instructions (Addendum)
Have someone stay with you until you feel stable. Keep all follow-up appointments as directed by your caregiver. Lie down right away if you start feeling like you might faint. Breathe deeply and steadily. Wait until all the symptoms have passed.Drink enough fluids to keep your urine clear or pale yellow. If you are taking blood pressure or heart medicine, get up slowly, taking several minutes to sit and then stand. This can reduce dizziness. SEEK IMMEDIATE MEDICAL CARE IF: You have a severe headache. You have unusual pain in the chest, abdomen, or back. You are bleeding from the mouth or rectum, or you have a black or tarry stool. You have an irregular or very fast heartbeat. You have pain with breathing. You have repeated fainting or seizure-like jerking during an episode. You faint when sitting or lying down. You have confusion. You have difficulty walking. You have severe weakness. You have vision problems. If you fainted, call your local emergency services - do not drive yourself to the hospital.   Please return to the emergency department immediately for any new or concerning symptoms, or if you get worse.   It was a pleasure caring for you today in the emergency department.  Please return to the emergency department for any worsening or worrisome symptoms.

## 2022-06-27 NOTE — ED Triage Notes (Signed)
[facility] reports pt coming from c/o LOC with PT. Pt was released from here yesterday for recent knee surgery. While at home with PT pt had a syncopal episode. Upon EMS arrival pt BP 68/40. EMS gave 2L fluid and BP came up.

## 2022-07-02 ENCOUNTER — Other Ambulatory Visit (HOSPITAL_COMMUNITY): Payer: Self-pay

## 2022-08-20 ENCOUNTER — Encounter (HOSPITAL_COMMUNITY): Payer: Medicare Other | Admitting: Cardiology

## 2022-08-26 ENCOUNTER — Ambulatory Visit: Payer: TRICARE For Life (TFL) | Admitting: Physician Assistant

## 2022-10-21 ENCOUNTER — Encounter (HOSPITAL_COMMUNITY): Payer: Self-pay | Admitting: Cardiology

## 2022-10-21 ENCOUNTER — Other Ambulatory Visit (HOSPITAL_COMMUNITY): Payer: Self-pay

## 2022-10-21 ENCOUNTER — Telehealth (HOSPITAL_COMMUNITY): Payer: Self-pay

## 2022-10-21 ENCOUNTER — Ambulatory Visit (HOSPITAL_COMMUNITY)
Admission: RE | Admit: 2022-10-21 | Discharge: 2022-10-21 | Disposition: A | Discharge status: Home / Self Care | Payer: Medicare Other | Source: Ambulatory Visit | Attending: Cardiology | Admitting: Cardiology

## 2022-10-21 VITALS — BP 180/100 | HR 57

## 2022-10-21 DIAGNOSIS — I130 Hypertensive heart and chronic kidney disease with heart failure and stage 1 through stage 4 chronic kidney disease, or unspecified chronic kidney disease: Secondary | ICD-10-CM | POA: Insufficient documentation

## 2022-10-21 DIAGNOSIS — Z955 Presence of coronary angioplasty implant and graft: Secondary | ICD-10-CM | POA: Diagnosis not present

## 2022-10-21 DIAGNOSIS — G4733 Obstructive sleep apnea (adult) (pediatric): Secondary | ICD-10-CM | POA: Insufficient documentation

## 2022-10-21 DIAGNOSIS — I4719 Other supraventricular tachycardia: Secondary | ICD-10-CM | POA: Diagnosis not present

## 2022-10-21 DIAGNOSIS — R002 Palpitations: Secondary | ICD-10-CM | POA: Diagnosis not present

## 2022-10-21 DIAGNOSIS — I2510 Atherosclerotic heart disease of native coronary artery without angina pectoris: Secondary | ICD-10-CM | POA: Insufficient documentation

## 2022-10-21 DIAGNOSIS — Z7982 Long term (current) use of aspirin: Secondary | ICD-10-CM | POA: Insufficient documentation

## 2022-10-21 DIAGNOSIS — I5032 Chronic diastolic (congestive) heart failure: Secondary | ICD-10-CM | POA: Diagnosis not present

## 2022-10-21 DIAGNOSIS — N183 Chronic kidney disease, stage 3 unspecified: Secondary | ICD-10-CM

## 2022-10-21 DIAGNOSIS — Z79899 Other long term (current) drug therapy: Secondary | ICD-10-CM | POA: Diagnosis not present

## 2022-10-21 DIAGNOSIS — R5383 Other fatigue: Secondary | ICD-10-CM | POA: Diagnosis present

## 2022-10-21 LAB — BASIC METABOLIC PANEL
Anion gap: 10 (ref 5–15)
BUN: 13 mg/dL (ref 8–23)
CO2: 26 mmol/L (ref 22–32)
Calcium: 8.6 mg/dL — ABNORMAL LOW (ref 8.9–10.3)
Chloride: 102 mmol/L (ref 98–111)
Creatinine, Ser: 1.14 mg/dL — ABNORMAL HIGH (ref 0.44–1.00)
GFR, Estimated: 50 mL/min — ABNORMAL LOW (ref >60)
Glucose, Bld: 112 mg/dL — ABNORMAL HIGH (ref 70–99)
Potassium: 4.3 mmol/L (ref 3.5–5.1)
Sodium: 138 mmol/L (ref 135–145)

## 2022-10-21 LAB — LIPID PANEL
Cholesterol: 74 mg/dL (ref 0–200)
HDL: 42 mg/dL (ref >40)
LDL Cholesterol: 12 mg/dL (ref 0–99)
Total CHOL/HDL Ratio: 1.8 RATIO
Triglycerides: 102 mg/dL (ref <150)
VLDL: 20 mg/dL (ref 0–40)

## 2022-10-21 LAB — BRAIN NATRIURETIC PEPTIDE: B Natriuretic Peptide: 365.2 pg/mL — ABNORMAL HIGH (ref 0.0–100.0)

## 2022-10-21 MED ORDER — AMLODIPINE BESYLATE 5 MG PO TABS: 5.0000 mg | ORAL_TABLET | Freq: Every day | ORAL | 3 refills | Status: DC

## 2022-10-21 MED ORDER — HYDRALAZINE HCL 25 MG PO TABS: 25.0000 mg | ORAL_TABLET | Freq: Three times a day (TID) | ORAL | 3 refills | Status: DC

## 2022-10-21 NOTE — Telephone Encounter (Signed)
Start hydralazine 25 mg tid instead.

## 2022-10-21 NOTE — Patient Instructions (Signed)
START Amlodipine 5 mg daily.  Labs done today, your results will be available in MyChart, we will contact you for abnormal readings.  Your provider has ordered Ultra sound of your renal Arteries. You will be called to have this test arranged.  Your provider has ordered lung function test for you. You will be called to have this test arranged.  PLEASE KEEP A RECORD OF YOUR DAILY BLOOD PRESSURE.  Please follow up with our heart failure pharmacist in 3 weeks.  Your physician recommends that you schedule a follow-up appointment in: 3 months.  If you have any questions or concerns before your next appointment please send [place] a message through [place] or call our office at [phone].    TO LEAVE A MESSAGE FOR THE NURSE SELECT OPTION 2, PLEASE LEAVE A MESSAGE INCLUDING: YOUR NAME DATE OF BIRTH CALL BACK NUMBER REASON FOR CALL**this is important as we prioritize the call backs  YOU WILL RECEIVE A CALL BACK THE SAME DAY AS LONG AS YOU CALL BEFORE 4:00 PM  At the [facility], you and your health needs are our priority. As part of our continuing mission to provide you with exceptional heart care, we have created designated Provider Care Teams. These Care Teams include your primary Cardiologist (physician) and Advanced Practice Providers (APPs- Physician Assistants and Nurse Practitioners) who all work together to provide you with the care you need, when you need it.   You may see any of the following providers on your designated Care Team at your next follow up: Dr Arvilla Meres Dr Marca Ancona Dr. Marcos Eke, NP Robbie Lis, [place] [facility] [place], [place] Brynda Peon, NP Karle Plumber, PharmD   Please be sure to bring in all your medications bottles to every appointment.    Thank you for choosing [facility]

## 2022-10-21 NOTE — Progress Notes (Signed)
ReDS Vest / Clip - 10/21/22 0900       ReDS Vest / Clip   Station Marker C    Ruler Value 30    ReDS Value Range Low volume    ReDS Actual Value 32

## 2022-10-21 NOTE — Telephone Encounter (Signed)
Spoke to patient and updated on medications. RX sent in for her.

## 2022-10-21 NOTE — Telephone Encounter (Signed)
Patient called and stated she is highly allergic to Amlodipine and cannot take. Can you switch to a different medication?

## 2022-10-22 NOTE — Progress Notes (Signed)
PCP: Angela Has, MD Cardiology: Dr. Shirlee Latch  77 y.o. with history of CAD, diastolic CHF, and exertional dyspnea was self-referred to CHF clinic.  I take care of Angela [name], Angela [name].  She [name] struggled with exertional dyspnea for the last several months.  Last echo in 1/19 showed vigorous LV systolic function with a mild intracavitary gradient.  Given exertional dyspnea and chest pressure, she had LHC in 5/21, showing 95% pRCA stenosis that was treated with DES.   Despite PCI, she did not feel much better.  She continued to have exertional dyspnea and chest pressure.  Angela chest pressure was nonexertional and happened at random.  Echo was done in 6/21, showing hyperdynamic LV with EF 65-70%, moderate LVH, normal RV. Toprol XL increased to see if this would help Angela diastolic function and also started Angela on low dose Lasix 20 mg daily.  Creatinine rose to 2.33 and Lasix was later stopped, creatinine dropped back to 1.08.    Cardiolite in 3/22 was a low risk study.   She was unable to tolerate Jardiance due to recurrent yeast infections.   Echo in 3/23 showed EF 63%, mild-moderate MR, RV normal, IVC normal.   Follow up 8/23 she was having episodes of fatigue/sweating with tachycardia, along with episodes of chest heaviness and DOE.  14 day Zio placed to quantify palpitations, this showed short atrial tachycardia runs. Decision made to undergo [facility] which showed near normal filling pressures, patent RCA stent, serial 50% proximal LAD stenoses, 80% ostial stenosis of a small D1. No change from prior study in the LAD/D1, does not appear to have interventional target. Would treat D1 medically.  Patient returns for followup of CHF and CAD. Weight down 5 lbs.  BP is very high today, 180/100. She took all Angela meds today, says SBP at home ranges 100s-170s. BP [name] been higher for the last 3-4 months.  She notes occasional palpitations but nothing long-lasting. No lightheadedness.  No chest pain.  +Fatigue.   No chest pain.  She is short of breath walking to the mailbox and with walking up stairs.    ECG (personally reviewed): NSR, LAFB, LVH  REDs clip 32%  Labs (5/21): K 4.1, creatinine 1.07. NT-proBNP 721 Labs (6/21): creatinine 2.33 Labs (7/21): LDL 72, K 3.7, creatinine 1.61 Labs (9/21): LDL 93, HDL 40 Labs (2/22): LDL 10, HDL 52 Labs (4/22): K 3.9, creatinine 1.74 Labs (6/22): K 3.5, creatinine 1.10 Labs (8/22): LDL 7 Labs (9/22): K 3.9, creatinine 1.26 Labs (2/23): K 4.9, creatinine 1.35 Labs (9/23): K 4.5, creatinine 1.32, LDL 2, HDL 47 Labs (1/24): K 3.9, creatinine 1.2  PMH:  1. Diastolic CHF: Echo in 1/19 with EF 65-70%, mild LV intracavitary gradient, mild LVH, very mild AS, RV size and systolic function normal.   - Echo (6/21): EF 65-70%, moderate LVH, normal RV, mild MR.  - Echo (3/23): EF 63%, mild-moderate MR, RV normal, IVC normal.  - R/LHC (9/23): RA 4, PA 31/9 (mean 20), PCW mean 15, CI (Fick)  2. Hyperlipidemia; Myalgias with atorvastatin, tolerates low dose Crestor.  3. CAD: LHC in 2017 with moderate nonobstructive disease.  - LHC (5/21): 70% D1, 95% pRCA, 60% mLAD, 60% dLAD. DES to RCA.  - Cardiolite (3/22): small partially reversible mid anterior defect, low risk and possibly shifting breast artifact.  - R/LHC (9/23): near normal filling pressures, RA mean 4, PA 31/9 (mean 15), PCWP 15, CO/CI (Fick) Patent RCA stent, serial 50% proximal LAD stenoses, 80% ostial stenosis of  a small D1. No change from prior study in the LAD/D1, does not appear to have interventional target. Would treat D1 medically. 4. HTN: Edema with amlodipine.  - Renal artery dopplers in 3/22: No evidence for renal artery stenosis.  5. Gastroparesis 6. OSA 7. CKD stage 3 8. Palpitations: Zio 14 day (9/23) showed mostly NSR, several runs of SVT (atrial tachycardia), no AF or worrisome arrhythmias.  ROS: All systems reviewed and negative except as per HPI.   Social History   Socioeconomic  History   Marital status: Married    Spouse name: Not on file   Number of children: Not on file   Years of education: Not on file   Highest education level: Not on file  Occupational History   Not on file  Tobacco Use   Smoking status: Never   Smokeless tobacco: Never  Vaping Use   Vaping Use: Never used  Substance and Sexual Activity   Alcohol use: No    Alcohol/week: 0.0 standard drinks of alcohol   Drug use: No   Sexual activity: Not Currently  Other Topics Concern   Not on file  Social History Narrative   Not on file   Social Determinants of Health   Financial Resource Strain: Not on file  Food Insecurity: Patient Declined (06/24/2022)   Hunger Vital Sign    Worried About Running Out of Food in the Last Year: Patient declined    Ran Out of Food in the Last Year: Patient declined  Transportation Needs: No Transportation Needs (06/24/2022)   PRAPARE - Administrator, Civil Service (Medical): No    Lack of Transportation (Non-Medical): No  Physical Activity: Not on file  Stress: Not on file  Social Connections: Not on file  Intimate Partner Violence: Unknown (06/24/2022)   Humiliation, Afraid, Rape, and Kick questionnaire    Fear of Current or Ex-Partner: No    Emotionally Abused: Patient declined    Physically Abused: Patient declined    Sexually Abused: Patient declined   Family History  Problem Relation Age of Onset   Hypertension Mother    Heart disease Mother    Heart attack Father    Stroke Father    Diabetes Sister    Hypertension Sister    Stroke Other    Diabetes Other    Heart disease Other    Current Outpatient Medications  Medication Sig Dispense Refill   acetaminophen (TYLENOL) 500 MG tablet Take 2 tablets (1,000 mg total) by mouth every 6 (six) hours as needed for mild pain or moderate pain. 60 tablet 0   aspirin EC 81 MG tablet Take 1 tablet (81 mg total) by mouth 2 (two) times daily. To prevent blood clots for 30 days after surgery.  60 tablet 0   cetirizine (ZYRTEC) 10 MG tablet Take 10 mg by mouth daily.     Cyanocobalamin (B-12) 2500 MCG TABS Take 2,500 mcg by mouth daily.     docusate sodium (COLACE) 100 MG capsule Take 1 capsule (100 mg total) by mouth 2 (two) times daily as needed for mild constipation or moderate constipation. 20 capsule 0   DULoxetine (CYMBALTA) 20 MG capsule Take 20 mg by mouth 2 (two) times daily.      ENTRESTO 97-103 MG TAKE 1 TABLET TWICE A DAY 180 tablet 3   escitalopram (LEXAPRO) 10 MG tablet Take 10 mg by mouth at bedtime.     esomeprazole (NEXIUM) 40 MG capsule Take 40 mg by mouth daily.  fluticasone (FLONASE) 50 MCG/ACT nasal spray Place 2 sprays into both nostrils daily as needed for allergies (seasonal allergies).      hydrOXYzine (ATARAX) 50 MG tablet Take 50 mg by mouth at bedtime.     isosorbide mononitrate (IMDUR) 60 MG 24 hr tablet Take 1.5 tablets (90 mg total) by mouth daily. 90 tablet 3   levorphanol (LEVODROMORAN) 2 MG tablet Take 2 mg by mouth 3 (three) times daily.     methocarbamol (ROBAXIN-750) 750 MG tablet Take 1 tablet (750 mg total) by mouth every 8 (eight) hours as needed for muscle spasms. 20 tablet 0   metoprolol succinate (TOPROL-XL) 100 MG 24 hr tablet TAKE 1 TABLET TWICE A DAY,TAKE WITH OR IMMEDIATELY FOLLOWING A MEAL (DISCONTINUE METOPROLOL TARTRATE) 180 tablet 3   nitroGLYCERIN (NITROSTAT) 0.4 MG SL tablet DISSOLVE 1 TABLET UNDER THE TONGUE EVERY 5 MINUTES AS NEEDED FOR CHEST PAIN, AS DIRECTED. 25 tablet 11   ondansetron (ZOFRAN) 4 MG tablet Take 4 mg by mouth every 8 (eight) hours as needed for nausea or vomiting.     oxyCODONE (ROXICODONE) 5 MG immediate release tablet Take 1 tablet (5 mg total) by mouth every 4 (four) hours as needed for severe pain (after surgery that is not resolved by your normal daily dose of Levorphanol). 30 tablet 0   REPATHA SURECLICK 140 MG/ML SOAJ INJECT 1 PEN UNDER THE SKIN EVERY 14 DAYS 6 mL 3   rOPINIRole (REQUIP) 1 MG tablet Take  1-2 mg by mouth See admin instructions. Take 1 mg in the afternoon and 2 mg at night     rosuvastatin (CRESTOR) 10 MG tablet Take 1 tablet (10 mg total) by mouth at bedtime. 90 tablet 3   spironolactone (ALDACTONE) 25 MG tablet TAKE 1 TABLET DAILY 30 tablet 11   VITAMIN D PO Take 1 capsule by mouth daily.     amLODipine (NORVASC) 5 MG tablet Take 1 tablet (5 mg total) by mouth daily. 90 tablet 3   hydrALAZINE (APRESOLINE) 25 MG tablet Take 1 tablet (25 mg total) by mouth 3 (three) times daily. 270 tablet 3   No current facility-administered medications for this encounter.   Wt Readings from Last 3 Encounters:  06/24/22 79 kg (174 lb 2.6 oz)  06/10/22 79.4 kg (175 lb)  02/25/22 81.6 kg (179 lb 12.8 oz)   BP (!) 180/100   Pulse (!) 57   SpO2 98%  General: NAD Neck: No JVD, no thyromegaly or thyroid nodule.  Lungs: Clear to auscultation bilaterally with normal respiratory effort. CV: Nondisplaced PMI.  Heart regular S1/S2, no S3/S4, no murmur.  No peripheral edema.  No carotid bruit.  Normal pedal pulses.  Abdomen: Soft, nontender, no hepatosplenomegaly, no distention.  Skin: Intact without lesions or rashes.  Neurologic: Alert and oriented x 3.  Psych: Normal affect. Extremities: No clubbing or cyanosis.  HEENT: Normal.   Assessment/Plan:  1. Chronic diastolic CHF: Echo in 1/19 showed vigorous LV systolic function EF 65-70% with intracavity gradient.  Echo in 6/21 showed EF 65-70% with moderate LVH, normal RV.  Echo in 3/23 showed EF 63%, mild-moderate MR, RV normal, IVC normal.  RHC (9/23) showed normal filling pressures. NYHA class II-III, not volume overloaded by exam or REDS clip.  - Unable to tolerate Jardiance due to yeast infections.  - Continue Entresto 97/103 mg bid. BMET/BNP today. - Continue spironolactone 25 mg daily. - She does not appear to need Lasix.  - Given ongoing dyspnea with no obvious cardiac cause,  I will arrange for PFTs to assess lungs.  2. Coronary artery  disease: DES to RCA in 5/21. Cardiolite in 3/22 was low risk (small partially reversible mid-anterior defect may be shifting breast artifact). LHC (9/23) showed patent RCA stent, serial 50% proximal LAD stenoses, 80% ostial stenosis of a small D1. No change from prior study in the LAD/D1, does not appear to have interventional target. Would treat D1 medically.  No recent chest pain.  - Continue Imdur 90 mg daily and Toprol XL 100 mg bid.  - Continue ASA 81.  - Continue Crestor 10 g daily and Repatha.  Check lipids today.  3. CKD stage 3:  - BMET today.  4. HTN: BP [name] been running high for several months. She [name] been taking Angela meds.  - Renal artery dopplers to rule out renal artery stenosis.  - She cannot tolerate amlodipine due to swelling. Therefore, I will start hydralazine 50 mg tid.  This can be titrated up.  5. OSA: Continue CPAP. [name] struggled tolerating, advised following back up with Dr. Mayford Knife. 6. Atrial tachycardia: Short runs noted on Zio monitor.  She [name] occasional palpitations, not bothersome.   - Continue Toprol XL 100 mg bid.   Follow up in 3 months with APP.   Marca Ancona  10/22/2022

## 2022-10-28 ENCOUNTER — Other Ambulatory Visit: Payer: Self-pay | Admitting: Cardiology

## 2022-10-31 NOTE — Progress Notes (Signed)
Advanced Heart Failure Clinic Note  PCP: Farris Has, MD Cardiology: Dr. Shirlee Latch  HPI:  77 y.o. with history of CAD, diastolic CHF, and exertional dyspnea was self-referred to CHF clinic. She has struggled with exertional dyspnea for the last several months.  Echo in 06/2017 showed vigorous LV systolic function with a mild intracavitary gradient.  Given exertional dyspnea and chest pressure, she had LHC in 10/2019, showing 95% pRCA stenosis that was treated with DES.    Despite PCI, she did not feel much better.  She continued to have exertional dyspnea and chest pressure.  Her chest pressure was nonexertional and happened at random.  Echo was done in 11/2019, showing hyperdynamic LV with EF 65-70%, moderate LVH, normal RV.  Her metoprolol XL was increased to see if this would help her diastolic function and also started her on low dose Lasix 20 mg daily. Creatinine rose to 2.33 and Lasix was later stopped, creatinine dropped back to 1.08.    Cardiolite in 07/2020 was a low risk study.    She was unable to tolerate Jardiance due to recurrent yeast infections.   Echo in 07/2021 showed EF 63%, mild-moderate MR, RV normal, IVC normal.   Follow up 12/2021 she was having episodes of fatigue/sweating with tachycardia, along with episodes of chest heaviness and DOE.  14 day Zio placed to quantify palpitations, this showed short atrial tachycardia runs. Decision made to undergo [facility] which showed near normal filling pressures, patent RCA stent, serial 50% proximal LAD stenoses, 80% ostial stenosis of a small D1. No change from prior study in the LAD/D1, does not appear to have interventional target. Would treat D1 medically.    She returned to clinic in 10/21/22 for diastolic CHF and CAD follow up. Weight was down 5 lbs. BP was very high in clinic, 180/100. She took all her meds today, says SBP at home ranges 100s-170s. BP had been higher for the last 3-4 months. She noted occasional palpitations but nothing  long-lasting. No lightheadedness. No chest pain. +Fatigue. She was short of breath walking to the mailbox and with walking up stairs .    Today she returns to HF clinic for pharmacist medication titration. At recent visit to AHF clinic, hydralazine 25 mg TID was initiated for high BP. Overall she is feeling much better since starting the hydralazine. Her headaches have gone away and she doesn't feel as tired. She brought in her BP log from home. SBP has been running ~140-160 mmHg, with a few values in the 130s and 1-2 values 180-190 mmHg. BP in clinic 156/74. Still has some dizziness when rising quickly, but ok if she gets up slowly. Did have one episode of CP while cooking on 5/26 that resolved after taking 2 doses of SL NTG. Is short of breath after walking to the mailbox at the end of the driveway unless she walks very slowly. Weight at home has been stable at 173.8 lbs. She has intentionally lost ~20 lbs in the last year through diet changes. Has PRN Lasix, used one dose since last visit. No LEE, PND or orthopnea. Taking and tolerating all medications.     HF Medications: Metoprolol succinate 100 mg BID Entresto 97/103 mg BID Spironolactone 25 mg daily Hydralazine 25 mg TID Imdur 90 mg daily Lasix 20 mg PRN  Has the patient been experiencing any side effects to the medications prescribed? NO  Does the patient have any problems obtaining medications due to transportation or finances?  NO; [employer]  Understanding of regimen: good Understanding of indications: good Potential of compliance: good Patient understands to avoid NSAIDs. Patient understands to avoid decongestants.    Pertinent Lab Values: 10/21/22: Serum creatinine 1.14, BUN 13, Potassium 4.3, Sodium 138  Vital Signs:  Weight: 174.4 lbs (last clinic weight: not recorded) Blood pressure: 156/74 mmHg Heart rate: 58 bpm  Assessment/Plan: 1. Chronic diastolic CHF: Echo in 06/2017 showed vigorous LV systolic function EF 65-70%  with intracavity gradient.  Echo in 11/2019 showed EF 65-70% with moderate LVH, normal RV.  Echo in 07/2021 showed EF 63%, mild-moderate MR, RV normal, IVC normal.  RHC (01/2022) showed normal filling pressures.  - NYHA class II-III, euvolemic on exam - Continue Lasix 20 mg PRN fluid/edema  - Continue Entresto 97/103 mg BID. - Continue Spironolactone 25 mg daily - Unable to tolerate Jardiance due to yeast infections.  -Recently referred for PFTs given ongoing dyspnea with no obvious cardiac cause 2. Coronary artery disease: DES to RCA in 10/2019. Cardiolite in 07/2020 was low risk (small partially reversible mid-anterior defect may be shifting breast artifact). LHC (01/2022) showed patent RCA stent, serial 50% proximal LAD stenoses, 80% ostial stenosis of a small D1. No change from prior study in the LAD/D1, does not appear to have interventional target. Would treat D1 medically.  No recent chest pain.  - Continue Imdur 90 mg daily and metoprolol XL 100 mg BID.  - Continue ASA 81.  - Continue Crestor 10 g daily and Repatha.   3. CKD stage 3:  - Scr 1.14 on BMET 10/21/22 4. HTN: BP has been running high for several months. She has been taking her meds. SBP improved to ~140-160 at home, BP in clinic improved to 156/74 today. Goal BP <130/80.  - Renal artery dopplers to rule out renal artery stenosis. Scheduled for 11/28/22 - She cannot tolerate amlodipine due to swelling. - - Increase hydralazine to 50 mg TID 5. OSA: Continue CPAP. Has struggled tolerating, advised following back up with Dr. Mayford Knife. 6. Atrial tachycardia: Short runs noted on Zio monitor.  She has occasional palpitations, not bothersome.   - Continue metoprolol XL 100 mg bid.   Follow up in 2 months with [facility]  Karle Plumber, PharmD, BCPS, BCCP, CPP Heart Failure Clinic Pharmacist [phone]

## 2022-11-10 ENCOUNTER — Ambulatory Visit (HOSPITAL_COMMUNITY): Admission: RE | Admit: 2022-11-10 | Payer: Medicare Other | Source: Ambulatory Visit

## 2022-11-12 ENCOUNTER — Ambulatory Visit (HOSPITAL_COMMUNITY)
Admission: RE | Admit: 2022-11-12 | Discharge: 2022-11-12 | Disposition: A | Discharge status: Home / Self Care | Payer: Medicare Other | Source: Ambulatory Visit | Attending: Internal Medicine | Admitting: Internal Medicine

## 2022-11-12 VITALS — BP 156/74 | HR 58 | Wt 174.4 lb

## 2022-11-12 DIAGNOSIS — Z7982 Long term (current) use of aspirin: Secondary | ICD-10-CM | POA: Insufficient documentation

## 2022-11-12 DIAGNOSIS — I129 Hypertensive chronic kidney disease with stage 1 through stage 4 chronic kidney disease, or unspecified chronic kidney disease: Secondary | ICD-10-CM | POA: Diagnosis not present

## 2022-11-12 DIAGNOSIS — R42 Dizziness and giddiness: Secondary | ICD-10-CM | POA: Diagnosis not present

## 2022-11-12 DIAGNOSIS — I2510 Atherosclerotic heart disease of native coronary artery without angina pectoris: Secondary | ICD-10-CM | POA: Insufficient documentation

## 2022-11-12 DIAGNOSIS — I5032 Chronic diastolic (congestive) heart failure: Secondary | ICD-10-CM | POA: Insufficient documentation

## 2022-11-12 DIAGNOSIS — I4719 Other supraventricular tachycardia: Secondary | ICD-10-CM | POA: Insufficient documentation

## 2022-11-12 DIAGNOSIS — Z79899 Other long term (current) drug therapy: Secondary | ICD-10-CM | POA: Diagnosis not present

## 2022-11-12 DIAGNOSIS — G4733 Obstructive sleep apnea (adult) (pediatric): Secondary | ICD-10-CM | POA: Diagnosis not present

## 2022-11-12 DIAGNOSIS — R0609 Other forms of dyspnea: Secondary | ICD-10-CM | POA: Diagnosis not present

## 2022-11-12 DIAGNOSIS — N183 Chronic kidney disease, stage 3 unspecified: Secondary | ICD-10-CM | POA: Diagnosis not present

## 2022-11-12 DIAGNOSIS — R0602 Shortness of breath: Secondary | ICD-10-CM | POA: Diagnosis present

## 2022-11-12 MED ORDER — HYDRALAZINE HCL 50 MG PO TABS: 50.0000 mg | ORAL_TABLET | Freq: Three times a day (TID) | ORAL | 3 refills | Status: DC

## 2022-11-12 NOTE — Patient Instructions (Signed)
It was a pleasure seeing you today!  MEDICATIONS: -We are changing your medications today -Increase hydralazine to 50 mg (1 tablet) three times daily. You may take 2 tablets of the 25 mg strength three times daily until you receive the new strength.  -Call if you have questions about your medications.  NEXT APPOINTMENT: Return to clinic in 2 months with [facility].  In general, to take care of your heart failure: -Limit your fluid intake to 2 Liters (half-gallon) per day.   -Limit your salt intake to ideally 2-3 grams (2000-3000 mg) per day. -Weigh yourself daily and record, and bring that "weight diary" to your next appointment.  (Weight gain of 2-3 pounds in 1 day typically means fluid weight.) -The medications for your heart are to help your heart and help you live longer.   -Please contact us before stopping any of your heart medications.  Call the clinic at [phone] with questions or to reschedule future appointments.

## 2022-11-14 ENCOUNTER — Ambulatory Visit (HOSPITAL_COMMUNITY)
Admission: RE | Admit: 2022-11-14 | Discharge: 2022-11-14 | Disposition: A | Discharge status: Home / Self Care | Payer: Medicare Other | Source: Ambulatory Visit | Attending: Cardiology | Admitting: Cardiology

## 2022-11-14 DIAGNOSIS — I5032 Chronic diastolic (congestive) heart failure: Secondary | ICD-10-CM | POA: Diagnosis present

## 2022-11-14 LAB — PULMONARY FUNCTION TEST
DL/VA % pred: 86 %
DL/VA: 3.49 ml/min/mmHg/L
DLCO unc % pred: 78 %
DLCO unc: 16.06 ml/min/mmHg
FEF 25-75 Post: 2.6 L/sec
FEF 25-75 Pre: 2.44 L/sec
FEF2575-%Change-Post: 6 %
FEF2575-%Pred-Post: 150 %
FEF2575-%Pred-Pre: 141 %
FEV1-%Change-Post: 2 %
FEV1-%Pred-Post: 101 %
FEV1-%Pred-Pre: 99 %
FEV1-Post: 2.31 L
FEV1-Pre: 2.26 L
FEV1FVC-%Change-Post: 1 %
FEV1FVC-%Pred-Pre: 107 %
FEV6-%Change-Post: 2 %
FEV6-%Pred-Post: 98 %
FEV6-%Pred-Pre: 96 %
FEV6-Post: 2.84 L
FEV6-Pre: 2.78 L
FEV6FVC-%Change-Post: 0 %
FEV6FVC-%Pred-Post: 104 %
FEV6FVC-%Pred-Pre: 105 %
FVC-%Change-Post: 1 %
FVC-%Pred-Post: 94 %
FVC-%Pred-Pre: 92 %
FVC-Post: 2.85 L
FVC-Pre: 2.81 L
Post FEV1/FVC ratio: 81 %
Post FEV6/FVC ratio: 100 %
Pre FEV1/FVC ratio: 80 %
Pre FEV6/FVC Ratio: 100 %
RV % pred: 113 %
RV: 2.72 L
TLC % pred: 105 %
TLC: 5.66 L

## 2022-11-14 MED ORDER — ALBUTEROL SULFATE (2.5 MG/3ML) 0.083% IN NEBU
2.5000 mg | INHALATION_SOLUTION | Freq: Once | RESPIRATORY_TRACT | Status: AC
  Administered 2022-11-14: 2.5 mg via RESPIRATORY_TRACT

## 2022-11-28 ENCOUNTER — Ambulatory Visit (HOSPITAL_COMMUNITY)
Admission: RE | Admit: 2022-11-28 | Discharge: 2022-11-28 | Disposition: A | Discharge status: Home / Self Care | Payer: Medicare Other | Source: Ambulatory Visit | Attending: Cardiovascular Disease | Admitting: Cardiovascular Disease

## 2022-11-28 DIAGNOSIS — N183 Chronic kidney disease, stage 3 unspecified: Secondary | ICD-10-CM

## 2022-12-03 ENCOUNTER — Other Ambulatory Visit: Payer: Self-pay | Admitting: Cardiology

## 2022-12-12 ENCOUNTER — Encounter (HOSPITAL_COMMUNITY): Payer: Self-pay

## 2023-01-21 ENCOUNTER — Encounter (HOSPITAL_COMMUNITY): Payer: Medicare Other

## 2023-01-27 ENCOUNTER — Encounter (HOSPITAL_COMMUNITY): Payer: Self-pay | Admitting: Cardiology

## 2023-01-28 MED ORDER — SPIRONOLACTONE 25 MG PO TABS: 25.0000 mg | ORAL_TABLET | Freq: Every day | ORAL | 3 refills | Status: DC

## 2023-02-17 ENCOUNTER — Ambulatory Visit (HOSPITAL_COMMUNITY)
Admission: RE | Admit: 2023-02-17 | Discharge: 2023-02-17 | Disposition: A | Discharge status: Home / Self Care | Payer: Medicare Other | Source: Ambulatory Visit | Attending: Internal Medicine

## 2023-02-17 ENCOUNTER — Encounter (HOSPITAL_COMMUNITY): Payer: Self-pay

## 2023-02-17 VITALS — BP 130/70 | HR 57 | Wt 178.2 lb

## 2023-02-17 DIAGNOSIS — R42 Dizziness and giddiness: Secondary | ICD-10-CM | POA: Insufficient documentation

## 2023-02-17 DIAGNOSIS — I5032 Chronic diastolic (congestive) heart failure: Secondary | ICD-10-CM | POA: Diagnosis not present

## 2023-02-17 DIAGNOSIS — I2510 Atherosclerotic heart disease of native coronary artery without angina pectoris: Secondary | ICD-10-CM | POA: Diagnosis not present

## 2023-02-17 DIAGNOSIS — Z79899 Other long term (current) drug therapy: Secondary | ICD-10-CM | POA: Diagnosis not present

## 2023-02-17 DIAGNOSIS — R002 Palpitations: Secondary | ICD-10-CM | POA: Diagnosis not present

## 2023-02-17 DIAGNOSIS — N183 Chronic kidney disease, stage 3 unspecified: Secondary | ICD-10-CM | POA: Diagnosis not present

## 2023-02-17 DIAGNOSIS — Z955 Presence of coronary angioplasty implant and graft: Secondary | ICD-10-CM | POA: Insufficient documentation

## 2023-02-17 DIAGNOSIS — I10 Essential (primary) hypertension: Secondary | ICD-10-CM

## 2023-02-17 DIAGNOSIS — I4719 Other supraventricular tachycardia: Secondary | ICD-10-CM | POA: Diagnosis not present

## 2023-02-17 DIAGNOSIS — G4733 Obstructive sleep apnea (adult) (pediatric): Secondary | ICD-10-CM | POA: Diagnosis not present

## 2023-02-17 DIAGNOSIS — I130 Hypertensive heart and chronic kidney disease with heart failure and stage 1 through stage 4 chronic kidney disease, or unspecified chronic kidney disease: Secondary | ICD-10-CM | POA: Diagnosis present

## 2023-02-17 LAB — BASIC METABOLIC PANEL
Anion gap: 7 (ref 5–15)
BUN: 22 mg/dL (ref 8–23)
CO2: 26 mmol/L (ref 22–32)
Calcium: 8.7 mg/dL — ABNORMAL LOW (ref 8.9–10.3)
Chloride: 106 mmol/L (ref 98–111)
Creatinine, Ser: 1.53 mg/dL — ABNORMAL HIGH (ref 0.44–1.00)
GFR, Estimated: 35 mL/min — ABNORMAL LOW (ref >60)
Glucose, Bld: 75 mg/dL (ref 70–99)
Potassium: 4.3 mmol/L (ref 3.5–5.1)
Sodium: 139 mmol/L (ref 135–145)

## 2023-02-17 LAB — BRAIN NATRIURETIC PEPTIDE: B Natriuretic Peptide: 380.9 pg/mL — ABNORMAL HIGH (ref 0.0–100.0)

## 2023-02-17 MED ORDER — HYDRALAZINE HCL 25 MG PO TABS: 37.5000 mg | ORAL_TABLET | Freq: Three times a day (TID) | ORAL | 3 refills | Status: DC

## 2023-02-17 NOTE — Progress Notes (Signed)
ReDS Vest / Clip - 02/17/23 1100       ReDS Vest / Clip   Station Marker C    Ruler Value 28    ReDS Value Range Low volume    ReDS Actual Value 32

## 2023-02-17 NOTE — Progress Notes (Signed)
ADVANCED HF CLINIC NOTE   PCP: Farris Has, MD Cardiology: Dr. Shirlee Latch  77 y.o. with history of CAD, diastolic CHF, and exertional dyspnea was self-referred to CHF clinic.  I take care of her husband, Aftan Pliler.  She has struggled with exertional dyspnea for the last several months.  Last echo in 1/19 showed vigorous LV systolic function with a mild intracavitary gradient.  Given exertional dyspnea and chest pressure, she had LHC in 5/21, showing 95% pRCA stenosis that was treated with DES.   Despite PCI, she did not feel much better.  She continued to have exertional dyspnea and chest pressure.  Her chest pressure was nonexertional and happened at random.  Echo was done in 6/21, showing hyperdynamic LV with EF 65-70%, moderate LVH, normal RV. Toprol XL increased to see if this would help her diastolic function and also started her on low dose Lasix 20 mg daily.  Creatinine rose to 2.33 and Lasix was later stopped, creatinine dropped back to 1.08.    Cardiolite in 3/22 was a low risk study.   She was unable to tolerate Jardiance due to recurrent yeast infections.   Echo in 3/23 showed EF 63%, mild-moderate MR, RV normal, IVC normal.   Follow up 8/23 she was having episodes of fatigue/sweating with tachycardia, along with episodes of chest heaviness and DOE.  14 day Zio placed to quantify palpitations, this showed short atrial tachycardia runs. Decision made to undergo [facility] which showed near normal filling pressures, patent RCA stent, serial 50% proximal LAD stenoses, 80% ostial stenosis of a small D1. No change from prior study in the LAD/D1, does not appear to have interventional target. Would treat D1 medically.  F/u 5/24 BP elevated, hydralazine added. Renal artery dopplers (-).   Today she returns for AHF follow up. Overall feeling pretty good. Denies palpitations, CP, dizziness, edema, or PND/Orthopnea. Was experiencing SOB with exertion during the last 2-3 weeks but has since mostly  resolved. Appetite too good, doesn't really watch sodium intake. No fever or chills. Weight at home 175 pounds. SBP at home usually in 130s. Taking all medications. No smoking, occasional wine glass.    ECG (personally reviewed from 5/24): NSR, LAFB, LVH  REDs clip 32%  Labs (5/21): K 4.1, creatinine 1.07. NT-proBNP 721 Labs (6/21): creatinine 2.33 Labs (7/21): LDL 72, K 3.7, creatinine 1.61 Labs (9/21): LDL 93, HDL 40 Labs (2/22): LDL 10, HDL 52 Labs (4/22): K 3.9, creatinine 1.74 Labs (6/22): K 3.5, creatinine 1.10 Labs (8/22): LDL 7 Labs (9/22): K 3.9, creatinine 1.26 Labs (2/23): K 4.9, creatinine 1.35 Labs (9/23): K 4.5, creatinine 1.32, LDL 2, HDL 47 Labs (1/24): K 3.9, creatinine 1.2 Labs (5/24): K 4.3, Scr 1.14, LDL 12  PMH:  1. Diastolic CHF: Echo in 1/19 with EF 65-70%, mild LV intracavitary gradient, mild LVH, very mild AS, RV size and systolic function normal.   - Echo (6/21): EF 65-70%, moderate LVH, normal RV, mild MR.  - Echo (3/23): EF 63%, mild-moderate MR, RV normal, IVC normal.  - R/LHC (9/23): RA 4, PA 31/9 (mean 20), PCW mean 15, CI (Fick)  - PFTs 6/24 no COPD 2. Hyperlipidemia; Myalgias with atorvastatin, tolerates low dose Crestor.  3. CAD: LHC in 2017 with moderate nonobstructive disease.  - LHC (5/21): 70% D1, 95% pRCA, 60% mLAD, 60% dLAD. DES to RCA.  - Cardiolite (3/22): small partially reversible mid anterior defect, low risk and possibly shifting breast artifact.  - R/LHC (9/23): near normal filling pressures,  RA mean 4, PA 31/9 (mean 15), PCWP 15, CO/CI (Fick) Patent RCA stent, serial 50% proximal LAD stenoses, 80% ostial stenosis of a small D1. No change from prior study in the LAD/D1, does not appear to have interventional target. Would treat D1 medically. 4. HTN: Edema with amlodipine.  - Renal artery dopplers in 3/22: No evidence for renal artery stenosis.  - Renal artery dopplers 5/24: no RAS 5. Gastroparesis 6. OSA 7. CKD stage 3 8.  Palpitations: Zio 14 day (9/23) showed mostly NSR, several runs of SVT (atrial tachycardia), no AF or worrisome arrhythmias.  ROS: All systems reviewed and negative except as per HPI.   Social History   Socioeconomic History   Marital status: Married    Spouse name: Not on file   Number of children: Not on file   Years of education: Not on file   Highest education level: Not on file  Occupational History   Not on file  Tobacco Use   Smoking status: Never   Smokeless tobacco: Never  Vaping Use   Vaping status: Never Used  Substance and Sexual Activity   Alcohol use: No    Alcohol/week: 0.0 standard drinks of alcohol   Drug use: No   Sexual activity: Not Currently  Other Topics Concern   Not on file  Social History Narrative   Not on file   Social Determinants of Health   Financial Resource Strain: Not on file  Food Insecurity: Patient Declined (06/24/2022)   Hunger Vital Sign    Worried About Running Out of Food in the Last Year: Patient declined    Ran Out of Food in the Last Year: Patient declined  Transportation Needs: No Transportation Needs (06/24/2022)   PRAPARE - Administrator, Civil Service (Medical): No    Lack of Transportation (Non-Medical): No  Physical Activity: Not on file  Stress: Not on file  Social Connections: Unknown (10/13/2021)   Received from [facility], [facility]   Social Network    Social Network: Not on file  Intimate Partner Violence: Unknown (06/24/2022)   Humiliation, Afraid, Rape, and Kick questionnaire    Fear of Current or Ex-Partner: No    Emotionally Abused: Patient declined    Physically Abused: Patient declined    Sexually Abused: Patient declined   Family History  Problem Relation Age of Onset   Hypertension Mother    Heart disease Mother    Heart attack Father    Stroke Father    Diabetes Sister    Hypertension Sister    Stroke Other    Diabetes Other    Heart disease Other    Current Outpatient  Medications  Medication Sig Dispense Refill   acetaminophen (TYLENOL) 500 MG tablet Take 2 tablets (1,000 mg total) by mouth every 6 (six) hours as needed for mild pain or moderate pain. 60 tablet 0   aspirin EC 81 MG tablet Take 81 mg by mouth daily. Swallow whole.     cetirizine (ZYRTEC) 10 MG tablet Take 10 mg by mouth daily.     Cyanocobalamin (B-12) 2500 MCG TABS Take 2,500 mcg by mouth daily.     docusate sodium (COLACE) 100 MG capsule Take 1 capsule (100 mg total) by mouth 2 (two) times daily as needed for mild constipation or moderate constipation. 20 capsule 0   DULoxetine (CYMBALTA) 20 MG capsule Take 20 mg by mouth 2 (two) times daily.      ENTRESTO 97-103 MG TAKE 1 TABLET TWICE  A DAY 180 tablet 3   escitalopram (LEXAPRO) 10 MG tablet Take 10 mg by mouth at bedtime.     esomeprazole (NEXIUM) 40 MG capsule Take 40 mg by mouth daily.     fluticasone (FLONASE) 50 MCG/ACT nasal spray Place 2 sprays into both nostrils daily as needed for allergies (seasonal allergies).      hydrOXYzine (ATARAX) 50 MG tablet Take 50 mg by mouth at bedtime.     isosorbide mononitrate (IMDUR) 60 MG 24 hr tablet TAKE 1 TABLET DAILY 90 tablet 3   levorphanol (LEVODROMORAN) 2 MG tablet Take 2 mg by mouth 3 (three) times daily.     metoprolol succinate (TOPROL-XL) 100 MG 24 hr tablet TAKE 1 TABLET TWICE A DAY,TAKE WITH OR IMMEDIATELY FOLLOWING A MEAL (DISCONTINUE METOPROLOL TARTRATE) 180 tablet 3   nitroGLYCERIN (NITROSTAT) 0.4 MG SL tablet DISSOLVE 1 TABLET UNDER THE TONGUE EVERY 5 MINUTES AS NEEDED FOR CHEST PAIN, AS DIRECTED. 25 tablet 11   ondansetron (ZOFRAN) 4 MG tablet Take 4 mg by mouth as needed for nausea or vomiting.     pramipexole (MIRAPEX) 0.5 MG tablet Take 0.5 mg by mouth at bedtime.     REPATHA SURECLICK 140 MG/ML SOAJ INJECT 1 PEN UNDER THE SKIN EVERY 14 DAYS 6 mL 3   rosuvastatin (CRESTOR) 10 MG tablet TAKE 1 TABLET AT BEDTIME 90 tablet 3   spironolactone (ALDACTONE) 25 MG tablet Take 1 tablet  (25 mg total) by mouth daily. 90 tablet 3   VITAMIN D PO Take 1 capsule by mouth daily.     hydrALAZINE (APRESOLINE) 25 MG tablet Take 1.5 tablets (37.5 mg total) by mouth 3 (three) times daily. 220 tablet 3   No current facility-administered medications for this encounter.   Wt Readings from Last 3 Encounters:  02/17/23 80.8 kg (178 lb 3.2 oz)  11/12/22 79.1 kg (174 lb 6.4 oz)  06/24/22 79 kg (174 lb 2.6 oz)   BP 130/70   Pulse (!) 57   Wt 80.8 kg (178 lb 3.2 oz)   SpO2 97%   BMI 28.76 kg/m   Physical exam: General:  well appearing.  No respiratory difficulty. Walked into clinic.  HEENT: normal Neck: supple. JVD flat. Carotids 2+ bilat; no bruits. No lymphadenopathy or thyromegaly appreciated. Cor: PMI nondisplaced. Regular rate & rhythm. No rubs, gallops or murmurs. Lungs: clear Abdomen: soft, nontender, nondistended. No hepatosplenomegaly. No bruits or masses. Good bowel sounds. Extremities: no cyanosis, clubbing, rash, edema  Neuro: alert & oriented x 3, cranial nerves grossly intact. moves all 4 extremities w/o difficulty. Affect pleasant.   Assessment/Plan:  1. Chronic diastolic CHF: Echo in 1/19 showed vigorous LV systolic function EF 65-70% with intracavity gradient.  Echo in 6/21 showed EF 65-70% with moderate LVH, normal RV.  Echo in 3/23 showed EF 63%, mild-moderate MR, RV normal, IVC normal.  RHC (9/23) showed normal filling pressures. PFTs 6/24 no COPD. NYHA class II, not volume overloaded by exam or REDS clip.  - Unable to tolerate Jardiance due to yeast infections.  - Continue Entresto 97/103 mg bid. BMET/BNP today. - Continue spironolactone 25 mg daily. - She does not appear to need Lasix.  - BMET/BNP today 2. Coronary artery disease: DES to RCA in 5/21. Cardiolite in 3/22 was low risk (small partially reversible mid-anterior defect may be shifting breast artifact). LHC (9/23) showed patent RCA stent, serial 50% proximal LAD stenoses, 80% ostial stenosis of a small  D1. No change from prior study in the LAD/D1,  does not appear to have interventional target. Would treat D1 medically.  No recent chest pain.  - Continue Imdur 60 mg daily and Toprol XL 100 mg bid.  - Continue ASA 81.  - Continue Crestor 10 g daily and Repatha.  LDL 12 5/24 3. CKD stage 3:  - BMET today.  4. HTN: BP has been on the stable higher side in the 130s typically for her. She has been taking her meds.  - Renal artery dopplers 6/24 normal  - She cannot tolerate amlodipine due to swelling.  - Hydralazine titrated up 6/24 to 50 mg TID but she was getting dizzy and light headed so she cut it back to 50 BID. Discussed why its a TID medicine today, agreed to try at 37.5 mg TID dose instead. She will notify us if this causes any symptoms.  5. OSA: Continue CPAP. Has struggled tolerating, advised following back up with Dr. Mayford Knife. 6. Atrial tachycardia: Short runs noted on Zio monitor 9/23.  Rare palpitations.  - Continue Toprol XL 100 mg bid.   Follow up in 6 months with Dr. Shirlee Latch.   Alen Bleacher AGACNP-BC  02/17/2023

## 2023-02-17 NOTE — Patient Instructions (Signed)
CHANGE Hydralazine to 37.5mg  ( 1 1/2 Tab) Three times a day  Labs done today, your results will be available in MyChart, we will contact you for abnormal readings.  Your physician recommends that you schedule a follow-up appointment in: 6 months with Dr. Shirlee Latch ( March 2025) ** PLEASE CALL THE OFFICE IN [place] TO ARRANGE YOUR FOLLOW UP APPOINTMENT.**  If you have any questions or concerns before your next appointment please send [place] a message through [place] or call our office at [phone].    TO LEAVE A MESSAGE FOR THE NURSE SELECT OPTION 2, PLEASE LEAVE A MESSAGE INCLUDING: YOUR NAME DATE OF BIRTH CALL BACK NUMBER REASON FOR CALL**this is important as we prioritize the call backs  YOU WILL RECEIVE A CALL BACK THE SAME DAY AS LONG AS YOU CALL BEFORE 4:00 PM  At the [facility], you and your health needs are our priority. As part of our continuing mission to provide you with exceptional heart care, we have created designated Provider Care Teams. These Care Teams include your primary Cardiologist (physician) and Advanced Practice Providers (APPs- Physician Assistants and Nurse Practitioners) who all work together to provide you with the care you need, when you need it.   You may see any of the following providers on your designated Care Team at your next follow up: Dr Arvilla Meres Dr Marca Ancona Dr. Marcos Eke, NP Robbie Lis, [place] [facility] [place], [place] Brynda Peon, NP Karle Plumber, PharmD   Please be sure to bring in all your medications bottles to every appointment.    Thank you for choosing [facility]

## 2023-02-18 ENCOUNTER — Telehealth (HOSPITAL_COMMUNITY): Payer: Self-pay

## 2023-02-18 DIAGNOSIS — I5032 Chronic diastolic (congestive) heart failure: Secondary | ICD-10-CM

## 2023-02-18 NOTE — Telephone Encounter (Signed)
Patient advised and verbalized understanding,lab appointment scheduled,lab orders entered  Orders Placed This Encounter  Procedures   Basic metabolic panel    Standing Status:   Future    Standing Expiration Date:   02/18/2024    Order Specific Question:   Release to patient    Answer:   Immediate    Order Specific Question:   Release to patient    Answer:   Immediate [1]

## 2023-02-18 NOTE — Telephone Encounter (Signed)
-----   Message from Alen Bleacher sent at 02/17/2023  1:50 PM EDT ----- Renal function elevated. Hold Sherryll Burger and [place] for 3 days.   Can increase hydralazine to 50 mg TID during these 3 days for BP control. After 3 days cut back hydralazine to 37.5 mg TID and resume Entresto and Spiro at regular doses.  Needs repeat BMET in 1 week.

## 2023-02-25 ENCOUNTER — Ambulatory Visit (HOSPITAL_COMMUNITY)
Admission: RE | Admit: 2023-02-25 | Discharge: 2023-02-25 | Disposition: A | Discharge status: Home / Self Care | Payer: Medicare Other | Source: Ambulatory Visit | Attending: Cardiology | Admitting: Cardiology

## 2023-02-25 DIAGNOSIS — I5032 Chronic diastolic (congestive) heart failure: Secondary | ICD-10-CM | POA: Diagnosis present

## 2023-02-25 LAB — BASIC METABOLIC PANEL
Anion gap: 10 (ref 5–15)
BUN: 16 mg/dL (ref 8–23)
CO2: 25 mmol/L (ref 22–32)
Calcium: 9.4 mg/dL (ref 8.9–10.3)
Chloride: 105 mmol/L (ref 98–111)
Creatinine, Ser: 1.5 mg/dL — ABNORMAL HIGH (ref 0.44–1.00)
GFR, Estimated: 36 mL/min — ABNORMAL LOW (ref >60)
Glucose, Bld: 146 mg/dL — ABNORMAL HIGH (ref 70–99)
Potassium: 3.9 mmol/L (ref 3.5–5.1)
Sodium: 140 mmol/L (ref 135–145)

## 2023-07-21 ENCOUNTER — Other Ambulatory Visit: Payer: Self-pay

## 2023-07-21 ENCOUNTER — Emergency Department (HOSPITAL_BASED_OUTPATIENT_CLINIC_OR_DEPARTMENT_OTHER): Payer: Medicare Other

## 2023-07-21 ENCOUNTER — Other Ambulatory Visit (HOSPITAL_BASED_OUTPATIENT_CLINIC_OR_DEPARTMENT_OTHER): Payer: Self-pay

## 2023-07-21 ENCOUNTER — Emergency Department (HOSPITAL_BASED_OUTPATIENT_CLINIC_OR_DEPARTMENT_OTHER)
Admission: EM | Admit: 2023-07-21 | Discharge: 2023-07-21 | Disposition: A | Discharge status: Home / Self Care | Payer: Medicare Other | Attending: Emergency Medicine | Admitting: Emergency Medicine

## 2023-07-21 ENCOUNTER — Encounter (HOSPITAL_BASED_OUTPATIENT_CLINIC_OR_DEPARTMENT_OTHER): Payer: Self-pay | Admitting: *Deleted

## 2023-07-21 DIAGNOSIS — R059 Cough, unspecified: Secondary | ICD-10-CM | POA: Diagnosis present

## 2023-07-21 DIAGNOSIS — I110 Hypertensive heart disease with heart failure: Secondary | ICD-10-CM | POA: Insufficient documentation

## 2023-07-21 DIAGNOSIS — Z85038 Personal history of other malignant neoplasm of large intestine: Secondary | ICD-10-CM | POA: Insufficient documentation

## 2023-07-21 DIAGNOSIS — Z7982 Long term (current) use of aspirin: Secondary | ICD-10-CM | POA: Diagnosis not present

## 2023-07-21 DIAGNOSIS — Z79899 Other long term (current) drug therapy: Secondary | ICD-10-CM | POA: Insufficient documentation

## 2023-07-21 DIAGNOSIS — I2510 Atherosclerotic heart disease of native coronary artery without angina pectoris: Secondary | ICD-10-CM | POA: Diagnosis not present

## 2023-07-21 DIAGNOSIS — J101 Influenza due to other identified influenza virus with other respiratory manifestations: Secondary | ICD-10-CM | POA: Insufficient documentation

## 2023-07-21 DIAGNOSIS — I509 Heart failure, unspecified: Secondary | ICD-10-CM | POA: Diagnosis not present

## 2023-07-21 DIAGNOSIS — R030 Elevated blood-pressure reading, without diagnosis of hypertension: Secondary | ICD-10-CM | POA: Insufficient documentation

## 2023-07-21 LAB — RESP PANEL BY RT-PCR (RSV, FLU A&B, COVID)  RVPGX2
Influenza A by PCR: POSITIVE — AB
Influenza B by PCR: NEGATIVE
Resp Syncytial Virus by PCR: NEGATIVE
SARS Coronavirus 2 by RT PCR: NEGATIVE

## 2023-07-21 LAB — CBC
HCT: 38.5 % (ref 36.0–46.0)
Hemoglobin: 12.6 g/dL (ref 12.0–15.0)
MCH: 28.6 pg (ref 26.0–34.0)
MCHC: 32.7 g/dL (ref 30.0–36.0)
MCV: 87.5 fL (ref 80.0–100.0)
Platelets: 216 10*3/uL (ref 150–400)
RBC: 4.4 MIL/uL (ref 3.87–5.11)
RDW: 13.8 % (ref 11.5–15.5)
WBC: 3.8 10*3/uL — ABNORMAL LOW (ref 4.0–10.5)
nRBC: 0 % (ref 0.0–0.2)

## 2023-07-21 LAB — BASIC METABOLIC PANEL
Anion gap: 11 (ref 5–15)
BUN: 17 mg/dL (ref 8–23)
CO2: 27 mmol/L (ref 22–32)
Calcium: 9.4 mg/dL (ref 8.9–10.3)
Chloride: 105 mmol/L (ref 98–111)
Creatinine, Ser: 1.11 mg/dL — ABNORMAL HIGH (ref 0.44–1.00)
GFR, Estimated: 52 mL/min — ABNORMAL LOW (ref >60)
Glucose, Bld: 98 mg/dL (ref 70–99)
Potassium: 3.9 mmol/L (ref 3.5–5.1)
Sodium: 143 mmol/L (ref 135–145)

## 2023-07-21 LAB — HEPATIC FUNCTION PANEL
ALT: 14 U/L (ref 0–44)
AST: 22 U/L (ref 15–41)
Albumin: 3.8 g/dL (ref 3.5–5.0)
Alkaline Phosphatase: 57 U/L (ref 38–126)
Bilirubin, Direct: 0.2 mg/dL (ref 0.0–0.2)
Indirect Bilirubin: 0.4 mg/dL (ref 0.3–0.9)
Total Bilirubin: 0.6 mg/dL (ref 0.0–1.2)
Total Protein: 7.4 g/dL (ref 6.5–8.1)

## 2023-07-21 LAB — BRAIN NATRIURETIC PEPTIDE: B Natriuretic Peptide: 129.8 pg/mL — ABNORMAL HIGH (ref 0.0–100.0)

## 2023-07-21 LAB — TROPONIN I (HIGH SENSITIVITY)
Troponin I (High Sensitivity): 18 ng/L — ABNORMAL HIGH (ref <18)
Troponin I (High Sensitivity): 16 ng/L (ref <18)

## 2023-07-21 MED ORDER — OSELTAMIVIR PHOSPHATE 75 MG PO CAPS: 75.0000 mg | ORAL_CAPSULE | Freq: Two times a day (BID) | ORAL | 0 refills | Status: DC

## 2023-07-21 MED ORDER — BENZONATATE 100 MG PO CAPS: 100.0000 mg | ORAL_CAPSULE | Freq: Three times a day (TID) | ORAL | 0 refills | Status: DC

## 2023-07-21 MED ORDER — BENZONATATE 100 MG PO CAPS
100.0000 mg | ORAL_CAPSULE | Freq: Three times a day (TID) | ORAL | 0 refills | Status: DC
  Filled 2023-07-21: qty 21, 7d supply, fill #0

## 2023-07-21 MED ORDER — MORPHINE SULFATE (PF) 4 MG/ML IV SOLN
4.0000 mg | Freq: Once | INTRAVENOUS | Status: AC
  Administered 2023-07-21: 4 mg via INTRAVENOUS
  Filled 2023-07-21: qty 1

## 2023-07-21 MED ORDER — OSELTAMIVIR PHOSPHATE 75 MG PO CAPS
75.0000 mg | ORAL_CAPSULE | Freq: Two times a day (BID) | ORAL | 0 refills | Status: DC
  Filled 2023-07-21: qty 10, 5d supply, fill #0

## 2023-07-21 MED ORDER — HYDRALAZINE HCL 20 MG/ML IJ SOLN
10.0000 mg | Freq: Once | INTRAMUSCULAR | Status: AC
  Administered 2023-07-21: 10 mg via INTRAVENOUS
  Filled 2023-07-21: qty 1

## 2023-07-21 NOTE — ED Notes (Signed)
   07/21/23 1336  Respiratory Assessment  $ RT Protocol Assessment  Yes  Assessment Type Assess only  Respiratory Pattern Regular;Unlabored;Symmetrical;Dyspnea at rest  Chest Assessment Chest expansion symmetrical  Cough Congested  R Upper  Breath Sounds Diminished  L Upper Breath Sounds Diminished  R Lower Breath Sounds Fine crackles  L Lower Breath Sounds Diminished  Oxygen Therapy/Pulse Ox  O2 Therapy Room air  SpO2 98 %   Seen before triage.  NP cough, HR 97, Spo2 98%.  Hx of frequent PNAs

## 2023-07-21 NOTE — ED Triage Notes (Signed)
Pt has been sick with URI, cough and cold for a month.

## 2023-07-21 NOTE — ED Notes (Signed)
Pt reports taking 2 nitroglycerin SL tablets while in waiting room, 1st at 1350, 2nd at 1400.  Reports mid CP, non radiating, currently 5/10.

## 2023-07-21 NOTE — ED Notes (Signed)
 D/c paperwork reviewed with pt, including prescriptions and follow up care.  All questions and/or concerns addressed at time of d/c.  No further needs expressed. . Pt verbalized understanding, Ambulatory with family to ED exit, NAD.

## 2023-07-21 NOTE — ED Provider Notes (Signed)
[place] [facility] Provider Note   CSN: [id number] Arrival date & time: 07/21/23  1325     History  Chief Complaint  Patient presents with   URI    Nicha Hemann is a 78 y.o. female.  Patient with history of colon cancer, CHF, fibromyalgia, GERD, hyperlipidemia, hypertension, IBS, OSA, CAD presents today with complaints of cough, congestion, chest pain, and shortness of breath. She states that she has had several back to back respiratory illnesses over the past month. However she was getting better up until last Wednesday when she started having a worsening cough. States in the last 3 days she has noted more shortness of breath and chest pain. Also notes some right back pain but states this is really only present with coughing. Pain in her chest is more persistent and feels like pressure. This pain does not radiate. She has felt a little more winded when walking around as well. Denies any pain or swelling in her legs.  No recent travel or surgeries.  No history of blood clots. No active malignancy. No PND or orthopnea. No fevers or chills. Of note, patient does add that she has not had her blood pressure medication today. States she was feeling a little nauseated this morning and didn't want to take it. She has not had any vomiting, diarrhea, or abdominal pain. She has taken her blood pressure medication every day other than today.  The history is provided by the patient. No language interpreter was used.  URI Presenting symptoms: congestion and cough        Home Medications Prior to Admission medications   Medication Sig Start Date End Date Taking? Authorizing Provider  acetaminophen (TYLENOL) 500 MG tablet Take 2 tablets (1,000 mg total) by mouth every 6 (six) hours as needed for mild pain or moderate pain. 06/26/22   Jenne Pane, PA-C  aspirin EC 81 MG tablet Take 81 mg by mouth daily. Swallow whole.    [provider]   cetirizine (ZYRTEC) 10 MG tablet Take 10 mg by mouth daily.    [provider]  Cyanocobalamin (B-12) 2500 MCG TABS Take 2,500 mcg by mouth daily.    [provider]  DULoxetine (CYMBALTA) 20 MG capsule Take 20 mg by mouth 2 (two) times daily.  03/26/17   [provider]  ENTRESTO 97-103 MG TAKE 1 TABLET TWICE A DAY 10/28/22   Laurey Morale, MD  escitalopram (LEXAPRO) 10 MG tablet Take 10 mg by mouth at bedtime. 11/18/18   [provider]  esomeprazole (NEXIUM) 40 MG capsule Take 40 mg by mouth daily.    [provider]  fluticasone (FLONASE) 50 MCG/ACT nasal spray Place 2 sprays into both nostrils daily as needed for allergies (seasonal allergies).     [provider]  hydrALAZINE (APRESOLINE) 25 MG tablet Take 1.5 tablets (37.5 mg total) by mouth 3 (three) times daily. 02/17/23   Alen Bleacher, NP  hydrOXYzine (ATARAX) 50 MG tablet Take 50 mg by mouth at bedtime. 02/14/22   [provider]  isosorbide mononitrate (IMDUR) 60 MG 24 hr tablet TAKE 1 TABLET DAILY 12/03/22   Laurey Morale, MD  levorphanol (LEVODROMORAN) 2 MG tablet Take 2 mg by mouth 3 (three) times daily.    [provider]  metoprolol succinate (TOPROL-XL) 100 MG 24 hr tablet TAKE 1 TABLET TWICE A DAY,TAKE WITH OR IMMEDIATELY FOLLOWING A MEAL (DISCONTINUE METOPROLOL TARTRATE) 06/05/22   Marca Ancona  S, MD  nitroGLYCERIN (NITROSTAT) 0.4 MG SL tablet DISSOLVE 1 TABLET UNDER THE TONGUE EVERY 5 MINUTES AS NEEDED FOR CHEST PAIN, AS DIRECTED. 10/28/22   Laurey Morale, MD  ondansetron (ZOFRAN) 4 MG tablet Take 4 mg by mouth as needed for nausea or vomiting.    [provider]  pramipexole (MIRAPEX) 0.5 MG tablet Take 0.5 mg by mouth at bedtime.    [provider]  REPATHA SURECLICK 140 MG/ML SOAJ INJECT 1 PEN UNDER THE SKIN EVERY 14 DAYS 06/05/22   Laurey Morale, MD  rosuvastatin (CRESTOR) 10 MG tablet TAKE 1 TABLET AT BEDTIME 10/28/22   Laurey Morale, MD  spironolactone (ALDACTONE) 25 MG tablet Take 1 tablet (25 mg total) by mouth daily. 01/28/23   Bensimhon, Bevelyn Buckles, MD  VITAMIN D PO Take 1 capsule by mouth daily.    [provider]      Allergies    Phenergan [promethazine], Timentin [ticarcillin-pot clavulanate], Pregabalin, Norvasc [amlodipine], and Reglan [metoclopramide]    Review of Systems   Review of Systems  HENT:  Positive for congestion.   Respiratory:  Positive for cough and shortness of breath.   Cardiovascular:  Positive for chest pain.  All other systems reviewed and are negative.   Physical Exam Updated Vital Signs BP (!) 210/101   Pulse 100   Temp 98.5 F (36.9 C)   Resp 18   SpO2 99%  Physical Exam Vitals and nursing note reviewed.  Constitutional:      General: She is not in acute distress.    Appearance: Normal appearance. She is normal weight. She is not ill-appearing, toxic-appearing or diaphoretic.  HENT:     Head: Normocephalic and atraumatic.  Cardiovascular:     Rate and Rhythm: Normal rate and regular rhythm.     Pulses:          Dorsalis pedis pulses are 2+ on the right side and 2+ on the left side.       Posterior tibial pulses are 2+ on the right side and 2+ on the left side.     Heart sounds: Normal heart sounds.  Pulmonary:     Effort: Pulmonary effort is normal. No respiratory distress.     Breath sounds: Normal breath sounds.  Abdominal:     General: Abdomen is flat.     Palpations: Abdomen is soft.     Tenderness: There is no abdominal tenderness.  Musculoskeletal:        General: No tenderness. Normal range of motion.       Arms:     Cervical back: Normal range of motion.     Right lower leg: No edema.     Left lower leg: No edema.  Skin:    General: Skin is warm and dry.  Neurological:     General: No focal deficit present.     Mental Status: She is alert.  Psychiatric:        Mood and Affect: Mood normal.        Behavior: Behavior normal.     ED  Results / Procedures / Treatments   Labs (all labs ordered are listed, but only abnormal results are displayed) Labs Reviewed  RESP PANEL BY RT-PCR (RSV, FLU A&B, COVID)  RVPGX2 - Abnormal; Notable for the following components:      Result Value   Influenza A by PCR POSITIVE (*)    All other components within normal limits  CBC - Abnormal; Notable for  the following components:   WBC 3.8 (*)    All other components within normal limits  BASIC METABOLIC PANEL - Abnormal; Notable for the following components:   Creatinine, Ser 1.11 (*)    GFR, Estimated 52 (*)    All other components within normal limits  BRAIN NATRIURETIC PEPTIDE - Abnormal; Notable for the following components:   B Natriuretic Peptide 129.8 (*)    All other components within normal limits  TROPONIN I (HIGH SENSITIVITY) - Abnormal; Notable for the following components:   Troponin I (High Sensitivity) 18 (*)    All other components within normal limits  HEPATIC FUNCTION PANEL  TROPONIN I (HIGH SENSITIVITY)    EKG None  Radiology DG Chest 2 View Result Date: 07/21/2023 CLINICAL DATA:  Cough EXAM: CHEST - 2 VIEW COMPARISON:  Chest radiograph 06/27/2022 FINDINGS: The heart size and mediastinal contours are within normal limits. Both lungs are clear. The visualized skeletal structures are unremarkable. IMPRESSION: No active cardiopulmonary disease. Electronically Signed   By: Annia Belt M.D.   On: 07/21/2023 16:13    Procedures Procedures    Medications Ordered in ED Medications  hydrALAZINE (APRESOLINE) injection 10 mg (10 mg Intravenous Given 07/21/23 1638)  morphine (PF) 4 MG/ML injection 4 mg (4 mg Intravenous Given 07/21/23 1816)    ED Course/ Medical Decision Making/ A&P                                 Medical Decision Making Amount and/or Complexity of Data Reviewed Labs: ordered. Radiology: ordered.  Risk Prescription drug management.   This patient is a 78 y.o. female who presents to the ED  for concern of cough, congestion, chest pain, shortness of breath, this involves an extensive number of treatment options, and is a complaint that carries with it a high risk of complications and morbidity. The emergent differential diagnosis prior to evaluation includes, but is not limited to, URI, ACS, pericarditis, myocarditis, aortic dissection, PE, pneumothorax, pneumonia, reflux/PUD, costochondritis, anxiety, CHF, pericardial effusion/tamponade, arrhythmias, COPD, asthma, bronchitis, anemia  This is not an exhaustive differential.   Past Medical History / Co-morbidities / Social History:  has a past medical history of Anxiety, Cancer of sigmoid (HCC), CHF (congestive heart failure) (HCC), Depression (emotion), DJD (degenerative joint disease), Fibromyalgia, GERD (gastroesophageal reflux disease), Hemorrhoids, Hyperlipidemia, Hypertension, IBS (irritable bowel syndrome), MRSA (methicillin resistant Staphylococcus aureus) septicemia (HCC), OSA, Over weight, Pancreatitis, Pneumonia, PONV (postoperative nausea and vomiting), RLS (restless legs syndrome), and Tachycardia.  Additional history: Chart reviewed. Pertinent results include: Last echo showed EF 63% in 3/23. Had DES in RCA placed in 5/21  Physical Exam: Physical exam performed. The pertinent findings include: Overall well appearing, speaking in complete sentences, lung sounds CTA.   Lab Tests: I ordered, and personally interpreted labs.  The pertinent results include:  Flu positive, WBC 3.8 likely due to flu, BNP 129. Creatinine at baseline. Troponin 18 --> 16   Imaging Studies: I ordered imaging studies including CXR. I independently visualized and interpreted imaging which showed NAD. I agree with the radiologist interpretation.   Cardiac Monitoring:  The patient was maintained on a cardiac monitor.  Cardiac monitor showed an underlying rhythm of: sinus rhythm, no STEMI. I agree with this interpretation.   Medications: I ordered  medication including hydralazine for hypertension, morphine for pain. Reevaluation of the patient after these medicines showed that the patient improved. I have reviewed the patients home medicines  and have made adjustments as needed.  Disposition: After consideration of the diagnostic results and the patients response to treatment, I feel that emergency department workup does not suggest an emergent condition requiring admission or immediate intervention beyond what has been performed at this time. The plan is: discharge with close outpatient follow-up and return precautions. After medications, she is feeling better and ready to go home. She is flu positive, symptoms consistent with same.  She is able to make a lab through the department without any significant dyspnea.  She did have a troponin of 18, however suspect this is due to her hypertension.  She did not take her medication today.  Emphasized importance of medication compliance.  Her blood pressure did improve with IV hydralazine given to her today. Symptoms not consistent with PE or AAA. She wants to go home. Will send for Tamiflu given patients comorbidities despite being out of the 48 hour window. She is also requesting this medication. Will send for Tessalon as well. Evaluation and diagnostic testing in the emergency department does not suggest an emergent condition requiring admission or immediate intervention beyond what has been performed at this time.  Plan for discharge with close PCP follow-up.  Patient is understanding and amenable with plan, educated on red flag symptoms that would prompt immediate return.  Patient discharged in stable condition.  Final Clinical Impression(s) / ED Diagnoses Final diagnoses:  Influenza A  Elevated blood pressure reading    Rx / DC Orders ED Discharge Orders          Ordered    oseltamivir (TAMIFLU) 75 MG capsule  Every 12 hours        07/21/23 1934    benzonatate (TESSALON) 100 MG capsule  Every  8 hours        07/21/23 1934          An After Visit Summary was printed and given to the patient.     Silva Bandy, PA-C 07/21/23 1937    Melene Plan, DO 07/21/23 1939

## 2023-07-21 NOTE — ED Notes (Signed)
Pt reports she has not been able to take her BP meds due to feeling unwell

## 2023-07-21 NOTE — ED Notes (Signed)
Lab notified to add-on BNP to previously collected blood  sample.   

## 2023-07-21 NOTE — ED Notes (Signed)
   07/21/23 1906  Resting  Supplemental oxygen during test? No  Resting Heart Rate 104  Resting Sp02 99  Lap 1 (250 feet)  HR 117  02 Sat 98   Deneies increased work of breathing, congested cough. HR max 117

## 2023-07-21 NOTE — ED Notes (Signed)
Lab notified to add-on troponin and HFP to previously collected blood  sample.

## 2023-07-21 NOTE — Discharge Instructions (Signed)
As we discussed, you tested positive for the flu today.  This is likely the cause of your symptoms.  As this is a viral illness, no antibiotics are indicated.  I recommend that you get plenty of rest and focus on symptomatic relief which includes Cepacol throat lozenges for sore throat, Mucinex for congestion, and tylenol as needed for fevers and bodyaches. I have also given you a prescription for Tamiflu which is the flu antiviral for you to take as prescribed as entirety as well as Tessalon which is a cough suppressant medication for you to take as prescribed as needed. I also recommend:  Increased fluid intake. Sports drinks offer valuable electrolytes, sugars, and fluids.  Breathing heated mist or steam (vaporizer or shower).  Eating chicken soup or other clear broths, and maintaining good nutrition.   Increasing usage of your inhaler if you have asthma.  Return to work when your temperature has returned to normal.  Gargle warm salt water and spit it out for sore throat. Take benadryl or Zyrtec to decrease sinus secretions.  Follow Up: Follow up with your primary care doctor in 5-7 days for recheck of ongoing symptoms.  Return to emergency department for emergent changing or worsening of symptoms.  Additionally, your blood pressure was notably high today.  This was likely contributing to you feeling unwell.  Please be sure to take your blood pressure medication as prescribed every day to help prevent these high readings.  Please check your blood pressure regularly as well and document your readings.  Please call your primary doctor to schedule follow-up appointment and discuss your readings at this appointment.

## 2023-07-22 ENCOUNTER — Other Ambulatory Visit (HOSPITAL_BASED_OUTPATIENT_CLINIC_OR_DEPARTMENT_OTHER): Payer: Self-pay

## 2023-08-21 ENCOUNTER — Telehealth (HOSPITAL_COMMUNITY): Payer: Self-pay | Admitting: Cardiology

## 2023-08-24 ENCOUNTER — Ambulatory Visit (HOSPITAL_COMMUNITY)
Admission: RE | Admit: 2023-08-24 | Discharge: 2023-08-24 | Disposition: A | Discharge status: Home / Self Care | Payer: Medicare Other | Source: Ambulatory Visit | Attending: Cardiology | Admitting: Cardiology

## 2023-08-24 ENCOUNTER — Encounter (HOSPITAL_COMMUNITY): Payer: Self-pay | Admitting: Cardiology

## 2023-08-24 VITALS — BP 140/70 | HR 62 | Wt 173.0 lb

## 2023-08-24 DIAGNOSIS — R0609 Other forms of dyspnea: Secondary | ICD-10-CM | POA: Diagnosis not present

## 2023-08-24 DIAGNOSIS — Z8249 Family history of ischemic heart disease and other diseases of the circulatory system: Secondary | ICD-10-CM | POA: Diagnosis not present

## 2023-08-24 DIAGNOSIS — I130 Hypertensive heart and chronic kidney disease with heart failure and stage 1 through stage 4 chronic kidney disease, or unspecified chronic kidney disease: Secondary | ICD-10-CM | POA: Insufficient documentation

## 2023-08-24 DIAGNOSIS — I5032 Chronic diastolic (congestive) heart failure: Secondary | ICD-10-CM

## 2023-08-24 DIAGNOSIS — I444 Left anterior fascicular block: Secondary | ICD-10-CM | POA: Insufficient documentation

## 2023-08-24 DIAGNOSIS — G4733 Obstructive sleep apnea (adult) (pediatric): Secondary | ICD-10-CM | POA: Diagnosis not present

## 2023-08-24 DIAGNOSIS — I2510 Atherosclerotic heart disease of native coronary artery without angina pectoris: Secondary | ICD-10-CM | POA: Diagnosis not present

## 2023-08-24 DIAGNOSIS — Z955 Presence of coronary angioplasty implant and graft: Secondary | ICD-10-CM | POA: Insufficient documentation

## 2023-08-24 DIAGNOSIS — I4719 Other supraventricular tachycardia: Secondary | ICD-10-CM | POA: Diagnosis not present

## 2023-08-24 DIAGNOSIS — N183 Chronic kidney disease, stage 3 unspecified: Secondary | ICD-10-CM | POA: Diagnosis not present

## 2023-08-24 DIAGNOSIS — Z79899 Other long term (current) drug therapy: Secondary | ICD-10-CM | POA: Diagnosis not present

## 2023-08-24 LAB — BASIC METABOLIC PANEL
Anion gap: 9 (ref 5–15)
BUN: 14 mg/dL (ref 8–23)
CO2: 26 mmol/L (ref 22–32)
Calcium: 9.4 mg/dL (ref 8.9–10.3)
Chloride: 104 mmol/L (ref 98–111)
Creatinine, Ser: 1.25 mg/dL — ABNORMAL HIGH (ref 0.44–1.00)
GFR, Estimated: 44 mL/min — ABNORMAL LOW (ref >60)
Glucose, Bld: 122 mg/dL — ABNORMAL HIGH (ref 70–99)
Potassium: 4.4 mmol/L (ref 3.5–5.1)
Sodium: 139 mmol/L (ref 135–145)

## 2023-08-24 LAB — LIPID PANEL
Cholesterol: 107 mg/dL (ref 0–200)
HDL: 43 mg/dL (ref >40)
LDL Cholesterol: 44 mg/dL (ref 0–99)
Total CHOL/HDL Ratio: 2.5 ratio
Triglycerides: 99 mg/dL (ref <150)
VLDL: 20 mg/dL (ref 0–40)

## 2023-08-24 LAB — BRAIN NATRIURETIC PEPTIDE: B Natriuretic Peptide: 230 pg/mL — ABNORMAL HIGH (ref 0.0–100.0)

## 2023-08-24 NOTE — Patient Instructions (Signed)
 There has been no changes to your medications.  Labs done today, your results will be available in MyChart, we will contact you for abnormal readings.  Your physician recommends that you schedule a follow-up appointment in: 6 months ( September) ** PLEASE CALL THE OFFICE IN [place] TO ARRANGE YOUR FOLLOW UP APPOINTMENT.**  If you have any questions or concerns before your next appointment please send [place] a message through [facility] or call our office at [phone].    TO LEAVE A MESSAGE FOR THE NURSE SELECT OPTION 2, PLEASE LEAVE A MESSAGE INCLUDING: YOUR NAME DATE OF BIRTH CALL BACK NUMBER REASON FOR CALL**this is important as we prioritize the call backs  YOU WILL RECEIVE A CALL BACK THE SAME DAY AS LONG AS YOU CALL BEFORE 4:00 PM  At the [facility], you and your health needs are our priority. As part of our continuing mission to provide you with exceptional heart care, we have created designated Provider Care Teams. These Care Teams include your primary Cardiologist (physician) and Advanced Practice Providers (APPs- Physician Assistants and Nurse Practitioners) who all work together to provide you with the care you need, when you need it.   You may see any of the following providers on your designated Care Team at your next follow up: Dr Arvilla Meres Dr Marca Ancona Dr. Dorthula Nettles Dr. Clearnce Hasten Amy Filbert Schilder, NP Robbie Lis, [place] [facility] [place], [place] Brynda Peon, NP [place] Lee, NP Clarisa Kindred, NP Karle Plumber, PharmD Enos Fling, PharmD   Please be sure to bring in all your medications bottles to every appointment.    Thank you for choosing [facility]

## 2023-08-24 NOTE — Progress Notes (Signed)
 ADV[facility]ED [facility] CLINIC NOTE   PCP: Farris Has, MD Cardiology: Dr. Shirlee Latch  Chief complaint: CAD  78 y.o. with history of CAD, diastolic CHF, and exertional dyspnea was self-referred to CHF clinic.  I take care of her husband, Angela [name].  She has struggled with exertional dyspnea for the last several months.  Last echo in 1/19 showed vigorous LV systolic function with a mild intracavitary gradient.  Given exertional dyspnea and chest pressure, she had LHC in 5/21, showing 95% pRCA stenosis that was treated with DES.   Despite PCI, she did not feel much better.  She continued to have exertional dyspnea and chest pressure.  Her chest pressure was nonexertional and happened at random.  Echo was done in 6/21, showing hyperdynamic LV with EF 65-70%, moderate LVH, normal RV. Toprol XL increased to see if this would help her diastolic function and also started her on low dose Lasix 20 mg daily.  Creatinine rose to 2.33 and Lasix was later stopped, creatinine dropped back to 1.08.    Cardiolite in 3/22 was a low risk study.   She was unable to tolerate Jardiance due to recurrent yeast infections.   Echo in 3/23 showed EF 63%, mild-moderate MR, RV normal, IVC normal.   Follow up 8/23 she was having episodes of fatigue/sweating with tachycardia, along with episodes of chest heaviness and DOE.  14 day Zio placed to quantify palpitations, this showed short atrial tachycardia runs. Decision made to undergo [facility] which showed near normal filling pressures, patent RCA stent, serial 50% proximal LAD stenoses, 80% ostial stenosis of a small D1. No change from prior study in the LAD/D1, does not appear to have interventional target. Would treat D1 medically.  6/24 renal artery dopplers showed no renal artery stenosis.   Today she returns for followup of CA.  She has been unable to use CPAP.  Weight is down 5 lbs. Rare chest heaviness, no trigger.  She last took NTG about a month ago while she had the flu. No  exertional dyspnea currently. No lightheadedness. SBP < 130 at home though mildly elevated here today.   ECG (personally reviewed): NSR, LAFB  Labs (5/21): K 4.1, creatinine 1.07. NT-proBNP 721 Labs (6/21): creatinine 2.33 Labs (7/21): LDL 72, K 3.7, creatinine 2.13 Labs (9/21): LDL 93, HDL 40 Labs (2/22): LDL 10, HDL 52 Labs (4/22): K 3.9, creatinine 1.74 Labs (6/22): K 3.5, creatinine 1.10 Labs (8/22): LDL 7 Labs (9/22): K 3.9, creatinine 1.26 Labs (2/23): K 4.9, creatinine 1.35 Labs (9/23): K 4.5, creatinine 1.32, LDL 2, HDL 47 Labs (1/24): K 3.9, creatinine 1.2 Labs (5/24): K 4.3, Scr 1.14, LDL 12 Labs (2/25): K 3.9, creatinine 1.11  PMH:  1. Diastolic CHF: Echo in 1/19 with EF 65-70%, mild LV intracavitary gradient, mild LVH, very mild AS, RV size and systolic function normal.   - Echo (6/21): EF 65-70%, moderate LVH, normal RV, mild MR.  - Echo (3/23): EF 63%, mild-moderate MR, RV normal, IVC normal.  - R/LHC (9/23): RA 4, PA 31/9 (mean 20), PCW mean 15, CI (Fick)  - PFTs 6/24 no COPD 2. Hyperlipidemia; Myalgias with atorvastatin, tolerates low dose Crestor.  3. CAD: LHC in 2017 with moderate nonobstructive disease.  - LHC (5/21): 70% D1, 95% pRCA, 60% mLAD, 60% dLAD. DES to RCA.  - Cardiolite (3/22): small partially reversible mid anterior defect, low risk and possibly shifting breast artifact.  - R/LHC (9/23): near normal filling pressures, RA mean 4, PA 31/9 (mean 15),  PCWP 15, CO/CI (Fick) Patent RCA stent, serial 50% proximal LAD stenoses, 80% ostial stenosis of a small D1. No change from prior study in the LAD/D1, does not appear to have interventional target. Would treat D1 medically. 4. HTN: Edema with amlodipine.  - Renal artery dopplers in 3/22: No evidence for renal artery stenosis.  - Renal artery dopplers 6/24: no RAS 5. Gastroparesis 6. OSA 7. CKD stage 3 8. Palpitations: Zio 14 day (9/23) showed mostly NSR, several runs of SVT (atrial tachycardia), no AF or  worrisome arrhythmias.  ROS: All systems reviewed and negative except as per HPI.   Social History   Socioeconomic History   Marital status: Married    Spouse name: Not on file   Number of children: Not on file   Years of education: Not on file   Highest education level: Not on file  Occupational History   Not on file  Tobacco Use   Smoking status: Never   Smokeless tobacco: Never  Vaping Use   Vaping status: Never Used  Substance and Sexual Activity   Alcohol use: No    Alcohol/week: 0.0 standard drinks of alcohol   Drug use: No   Sexual activity: Not Currently  Other Topics Concern   Not on file  Social History Narrative   Not on file   Social Drivers of Health   Financial Resource Strain: Not on file  Food Insecurity: Patient Declined (06/24/2022)   Hunger Vital Sign    Worried About Running Out of Food in the Last Year: Patient declined    Ran Out of Food in the Last Year: Patient declined  Transportation Needs: No Transportation Needs (06/24/2022)   PRAPARE - Administrator, Civil Service (Medical): No    Lack of Transportation (Non-Medical): No  Physical Activity: Not on file  Stress: Not on file  Social Connections: Unknown (10/13/2021)   Received from [facility]   Social Network    Social Network: Not on file  Intimate Partner Violence: Unknown (06/24/2022)   Humiliation, Afraid, Rape, and Kick questionnaire    Fear of Current or Ex-Partner: No    Emotionally Abused: Patient declined    Physically Abused: Patient declined    Sexually Abused: Patient declined   Family History  Problem Relation Age of Onset   Hypertension Mother    Heart disease Mother    Heart attack Father    Stroke Father    Diabetes Sister    Hypertension Sister    Stroke Other    Diabetes Other    Heart disease Other    Current Outpatient Medications  Medication Sig Dispense Refill   acetaminophen (TYLENOL) 500 MG tablet Take 2 tablets (1,000 mg  total) by mouth every 6 (six) hours as needed for mild pain or moderate pain. 60 tablet 0   aspirin EC 81 MG tablet Take 81 mg by mouth daily. Swallow whole.     cetirizine (ZYRTEC) 10 MG chewable tablet Chew 10 mg by mouth as needed for allergies.     Cyanocobalamin (B-12) 2500 MCG TABS Take 2,500 mcg by mouth daily.     DULoxetine (CYMBALTA) 20 MG capsule Take 20 mg by mouth 2 (two) times daily.      ENTRESTO 97-103 MG TAKE 1 TABLET TWICE A DAY 180 tablet 3   escitalopram (LEXAPRO) 20 MG tablet Take 20 mg by mouth at bedtime.     esomeprazole (NEXIUM) 40 MG capsule Take 40 mg by mouth daily.  fluticasone (FLONASE) 50 MCG/ACT nasal spray Place 2 sprays into both nostrils daily as needed for allergies (seasonal allergies).      hydrALAZINE (APRESOLINE) 25 MG tablet Take 1.5 tablets (37.5 mg total) by mouth 3 (three) times daily. 220 tablet 3   hydrOXYzine (ATARAX) 50 MG tablet Take 50 mg by mouth at bedtime.     isosorbide mononitrate (IMDUR) 60 MG 24 hr tablet TAKE 1 TABLET DAILY 90 tablet 3   metoprolol succinate (TOPROL-XL) 100 MG 24 hr tablet TAKE 1 TABLET TWICE A DAY,TAKE WITH OR IMMEDIATELY FOLLOWING A MEAL (DISCONTINUE METOPROLOL TARTRATE) 180 tablet 3   nitroGLYCERIN (NITROSTAT) 0.4 MG SL tablet DISSOLVE 1 TABLET UNDER THE TONGUE EVERY 5 MINUTES AS NEEDED FOR CHEST PAIN, AS DIRECTED. 25 tablet 11   ondansetron (ZOFRAN) 4 MG tablet Take 4 mg by mouth as needed for nausea or vomiting.     oxyCODONE (OXYCONTIN) 10 mg 12 hr tablet Take 10 mg by mouth every 6 (six) hours as needed.     REPATHA SURECLICK 140 MG/ML SOAJ INJECT 1 PEN UNDER THE SKIN EVERY 14 DAYS 6 mL 3   rosuvastatin (CRESTOR) 10 MG tablet TAKE 1 TABLET AT BEDTIME 90 tablet 3   spironolactone (ALDACTONE) 25 MG tablet Take 1 tablet (25 mg total) by mouth daily. 90 tablet 3   No current facility-administered medications for this encounter.   Wt Readings from Last 3 Encounters:  08/24/23 78.5 kg (173 lb)  02/17/23 80.8 kg  (178 lb 3.2 oz)  11/12/22 79.1 kg (174 lb 6.4 oz)   BP (!) 140/70   Pulse 62   Wt 78.5 kg (173 lb)   SpO2 96%   BMI 27.92 kg/m   Physical exam: General: NAD Neck: No JVD, no thyromegaly or thyroid nodule.  Lungs: Clear to auscultation bilaterally with normal respiratory effort. CV: Nondisplaced PMI.  Heart regular S1/S2, no S3/S4, 2/6 SEM RUSB with clear S2.  No peripheral edema.  No carotid bruit.  Normal pedal pulses.  Abdomen: Soft, nontender, no hepatosplenomegaly, no distention.  Skin: Intact without lesions or rashes.  Neurologic: Alert and oriented x 3.  Psych: Normal affect. Extremities: No clubbing or cyanosis.  HEENT: Normal.   Assessment/Plan:  1. Chronic diastolic CHF: Echo in 1/19 showed vigorous LV systolic function EF 65-70% with intracavity gradient.  Echo in 6/21 showed EF 65-70% with moderate LVH, normal RV.  Echo in 3/23 showed EF 63%, mild-moderate MR, RV normal, IVC normal.  RHC (9/23) showed normal filling pressures. PFTs 6/24 no COPD. NYHA class I-II, not volume overloaded on exam.  - Unable to tolerate Jardiance due to yeast infections.  - Continue Entresto 97/103 mg bid. BMET/BNP today. - Continue spironolactone 25 mg daily. - She does not appear to need Lasix.  2. Coronary artery disease: DES to RCA in 5/21. Cardiolite in 3/22 was low risk (small partially reversible mid-anterior defect may be shifting breast artifact). LHC (9/23) showed patent RCA stent, serial 50% proximal LAD stenoses, 80% ostial stenosis of a small D1. No change from prior study in the LAD/D1, does not appear to have interventional target. Would treat D1 medically.  She has occasional atypical chest pain.  - Continue Imdur 60 mg daily and Toprol XL 100 mg bid.  - Continue ASA 81.  - Continue Crestor 10 g daily and Repatha.  Check lipids.  3. CKD stage 3:  - BMET today.  4. HTN: Normal renal artery dopplers in 6/24.  SBP < 130 at home.  -  She cannot tolerate amlodipine due to swelling.   - Continue hydralazine 37.5 tid.  - Continue Entresto 97/103 bid - Continue spironolactone 25 daily.  5. OSA: Cannot use CPAP. 6. Atrial tachycardia: Short runs noted on Zio monitor 9/23.  Rare palpitations.  - Continue Toprol XL 100 mg bid.   Follow up in 6 months   I spent 22 minutes reviewing data, interviewing patient and his family, and organizing the orders/followup.   Angela [name]  08/24/2023

## 2023-08-26 ENCOUNTER — Other Ambulatory Visit (HOSPITAL_COMMUNITY): Payer: Self-pay | Admitting: Cardiology

## 2023-12-09 ENCOUNTER — Other Ambulatory Visit: Payer: Self-pay | Admitting: Cardiology

## 2023-12-29 LAB — LAB REPORT - SCANNED: EGFR: 40

## 2024-02-09 ENCOUNTER — Telehealth (HOSPITAL_COMMUNITY): Payer: Self-pay | Admitting: Cardiology

## 2024-02-09 NOTE — Telephone Encounter (Signed)
 Called to confirm/remind patient of their appointment at the [facility] on 02/09/24.   Appointment:   [x] Confirmed  [] Left mess   [] No answer/No voice mail  [] VM Full/unable to leave message  [] Phone not in service  Patient reminded to bring all medications and/or complete list.  Confirmed patient has transportation. Gave directions, instructed to utilize valet parking.

## 2024-02-10 ENCOUNTER — Encounter (HOSPITAL_COMMUNITY): Payer: Self-pay | Admitting: Cardiology

## 2024-02-10 ENCOUNTER — Ambulatory Visit (HOSPITAL_COMMUNITY)
Admission: RE | Admit: 2024-02-10 | Discharge: 2024-02-10 | Disposition: A | Discharge status: Home / Self Care | Source: Ambulatory Visit | Attending: Cardiology | Admitting: Cardiology

## 2024-02-10 ENCOUNTER — Ambulatory Visit (HOSPITAL_COMMUNITY): Payer: Self-pay | Admitting: Cardiology

## 2024-02-10 VITALS — BP 112/70 | HR 58 | Wt 172.2 lb

## 2024-02-10 DIAGNOSIS — R0789 Other chest pain: Secondary | ICD-10-CM | POA: Diagnosis not present

## 2024-02-10 DIAGNOSIS — R0609 Other forms of dyspnea: Secondary | ICD-10-CM | POA: Insufficient documentation

## 2024-02-10 DIAGNOSIS — N183 Chronic kidney disease, stage 3 unspecified: Secondary | ICD-10-CM | POA: Insufficient documentation

## 2024-02-10 DIAGNOSIS — Z79899 Other long term (current) drug therapy: Secondary | ICD-10-CM | POA: Insufficient documentation

## 2024-02-10 DIAGNOSIS — I5032 Chronic diastolic (congestive) heart failure: Secondary | ICD-10-CM

## 2024-02-10 DIAGNOSIS — I444 Left anterior fascicular block: Secondary | ICD-10-CM | POA: Insufficient documentation

## 2024-02-10 DIAGNOSIS — I4719 Other supraventricular tachycardia: Secondary | ICD-10-CM | POA: Insufficient documentation

## 2024-02-10 DIAGNOSIS — R9431 Abnormal electrocardiogram [ECG] [EKG]: Secondary | ICD-10-CM | POA: Diagnosis not present

## 2024-02-10 DIAGNOSIS — I130 Hypertensive heart and chronic kidney disease with heart failure and stage 1 through stage 4 chronic kidney disease, or unspecified chronic kidney disease: Secondary | ICD-10-CM | POA: Diagnosis not present

## 2024-02-10 DIAGNOSIS — Z955 Presence of coronary angioplasty implant and graft: Secondary | ICD-10-CM | POA: Diagnosis not present

## 2024-02-10 DIAGNOSIS — R002 Palpitations: Secondary | ICD-10-CM | POA: Diagnosis not present

## 2024-02-10 DIAGNOSIS — I5042 Chronic combined systolic (congestive) and diastolic (congestive) heart failure: Secondary | ICD-10-CM | POA: Insufficient documentation

## 2024-02-10 DIAGNOSIS — G4733 Obstructive sleep apnea (adult) (pediatric): Secondary | ICD-10-CM | POA: Insufficient documentation

## 2024-02-10 DIAGNOSIS — Z7982 Long term (current) use of aspirin: Secondary | ICD-10-CM | POA: Diagnosis not present

## 2024-02-10 DIAGNOSIS — I2510 Atherosclerotic heart disease of native coronary artery without angina pectoris: Secondary | ICD-10-CM | POA: Diagnosis present

## 2024-02-10 DIAGNOSIS — Z79891 Long term (current) use of opiate analgesic: Secondary | ICD-10-CM | POA: Diagnosis not present

## 2024-02-10 DIAGNOSIS — I2089 Other forms of angina pectoris: Secondary | ICD-10-CM

## 2024-02-10 LAB — BASIC METABOLIC PANEL WITH GFR
Anion gap: 8 (ref 5–15)
BUN: 15 mg/dL (ref 8–23)
CO2: 25 mmol/L (ref 22–32)
Calcium: 8.8 mg/dL — ABNORMAL LOW (ref 8.9–10.3)
Chloride: 108 mmol/L (ref 98–111)
Creatinine, Ser: 1.27 mg/dL — ABNORMAL HIGH (ref 0.44–1.00)
GFR, Estimated: 44 mL/min — ABNORMAL LOW (ref >60)
Glucose, Bld: 104 mg/dL — ABNORMAL HIGH (ref 70–99)
Potassium: 4.4 mmol/L (ref 3.5–5.1)
Sodium: 141 mmol/L (ref 135–145)

## 2024-02-10 LAB — BRAIN NATRIURETIC PEPTIDE: B Natriuretic Peptide: 496.4 pg/mL — ABNORMAL HIGH (ref 0.0–100.0)

## 2024-02-10 LAB — TSH: TSH: 2.433 u[IU]/mL (ref 0.350–4.500)

## 2024-02-10 MED ORDER — HYDRALAZINE HCL 25 MG PO TABS: 37.5000 mg | ORAL_TABLET | Freq: Three times a day (TID) | ORAL | 3 refills | Status: AC

## 2024-02-10 MED ORDER — ISOSORBIDE MONONITRATE ER 60 MG PO TB24: 60.0000 mg | ORAL_TABLET | Freq: Every day | ORAL | 3 refills | Status: AC

## 2024-02-10 MED ORDER — SPIRONOLACTONE 25 MG PO TABS: 25.0000 mg | ORAL_TABLET | Freq: Every day | ORAL | 3 refills | Status: AC

## 2024-02-10 NOTE — Patient Instructions (Signed)
 There has been no changes to your medications.  Labs done today, your results will be available in MyChart, we will contact you for abnormal readings.  Your physician has requested that you have an echocardiogram. Echocardiography is a painless test that uses sound waves to create images of your heart. It provides your doctor with information about the size and shape of your heart and how well your heart's chambers and valves are working. This procedure takes approximately one hour. There are no restrictions for this procedure. Please do NOT wear cologne, perfume, aftershave, or lotions (deodorant is allowed). Please arrive 15 minutes prior to your appointment time.  Please note: We ask at that you not bring children with you during ultrasound (echo/ vascular) testing. Due to room size and safety concerns, children are not allowed in the ultrasound rooms during exams. Our front office staff cannot provide observation of children in our lobby area while testing is being conducted. An adult accompanying a patient to their appointment will only be allowed in the ultrasound room at the discretion of the ultrasound technician under special circumstances. We apologize for any inconvenience.     Please report to Radiology at the [facility] Main Entrance 30 minutes early for your test.  [address] [place], [place] [address]                         OR   Please report to Radiology at [facility] Main Entrance, medical mall, 30 mins prior to your test.  [address]  [place], [place]  How to Prepare for Your Cardiac PET/CT Stress Test:  Nothing to eat or drink, except water, 3 hours prior to arrival time.  NO caffeine/decaffeinated products, or chocolate 12 hours prior to arrival. (Please note decaffeinated beverages (teas/coffees) still contain caffeine).  If you have caffeine within 12 hours prior, the test will need to be rescheduled.  Medication  instructions: Do not take erectile dysfunction medications for 72 hours prior to test (sildenafil, tadalafil) Do not take nitrates (isosorbide  mononitrate, Ranexa) the day before or day of test Do not take tamsulosin the day before or morning of test Hold theophylline containing medications for 12 hours. Hold Dipyridamole 48 hours prior to the test.  Diabetic Preparation: If able to eat breakfast prior to 3 hour fasting, you may take all medications, including your insulin. Do not worry if you miss your breakfast dose of insulin - start at your next meal. If you do not eat prior to 3 hour fast-Hold all diabetes (oral and insulin) medications. Patients who wear a continuous glucose monitor MUST remove the device prior to scanning.  You may take your remaining medications with water.  NO perfume, cologne or lotion on chest or abdomen area. FEMALES - Please avoid wearing dresses to this appointment.  Total time is 1 to 2 hours; you may want to bring reading material for the waiting time.  IF YOU THINK YOU MAY BE PREGNANT, OR ARE NURSING PLEASE INFORM THE TECHNOLOGIST.  In preparation for your appointment, medication and supplies will be purchased.  Appointment availability is limited, so if you need to cancel or reschedule, please call the Radiology Department Scheduler at [phone] 24 hours in advance to avoid a cancellation fee of $100.00  What to Expect When you Arrive:  Once you arrive and check in for your appointment, you will be taken to a preparation room within the Radiology Department.  A  technologist or Nurse will obtain your medical history, verify that you are correctly prepped for the exam, and explain the procedure.  Afterwards, an IV will be started in your arm and electrodes will be placed on your skin for EKG monitoring during the stress portion of the exam. Then you will be escorted to the PET/CT scanner.  There, staff will get you positioned on the scanner and obtain a  blood pressure and EKG.  During the exam, you will continue to be connected to the EKG and blood pressure machines.  A small, safe amount of a radioactive tracer will be injected in your IV to obtain a series of pictures of your heart along with an injection of a stress agent.    After your Exam:  It is recommended that you eat a meal and drink a caffeinated beverage to counter act any effects of the stress agent.  Drink plenty of fluids for the remainder of the day and urinate frequently for the first couple of hours after the exam.  Your doctor will inform you of your test results within 7-10 business days.  For more information and frequently asked questions, please visit our website: [email or web address]  For questions about your test or how to prepare for your test, please call: Cardiac Imaging Nurse Navigators Office: [phone]  Your physician recommends that you schedule a follow-up appointment in: 4 months.  If you have any questions or concerns before your next appointment please send us  a message through [place] or call our office at [phone].    TO LEAVE A MESSAGE FOR THE NURSE SELECT OPTION 2, PLEASE LEAVE A MESSAGE INCLUDING: YOUR NAME DATE OF BIRTH CALL BACK NUMBER REASON FOR CALL**this is important as we prioritize the call backs  YOU WILL RECEIVE A CALL BACK THE SAME DAY AS LONG AS YOU CALL BEFORE 4:00 PM  At the [facility], you and your health needs are our priority. As part of our continuing mission to provide you with exceptional heart care, we have created designated Provider Care Teams. These Care Teams include your primary Cardiologist (physician) and Advanced Practice Providers (APPs- Physician Assistants and Nurse Practitioners) who all work together to provide you with the care you need, when you need it.   You may see any of the following providers on your designated Care Team at your next follow up: Dr Toribio Fuel Dr  Ezra Shuck Dr. Ria Commander Dr. Morene Brownie Amy Lenetta, NP Caffie Shed, [place] [facility] [place], [place] Beckey Coe, NP [place] Lee, NP Ellouise Class, NP Tinnie Redman, PharmD Jaun Bash, PharmD   Please be sure to bring in all your medications bottles to every appointment.    Thank you for choosing  [facility]

## 2024-02-11 NOTE — Progress Notes (Signed)
 ADVANCED HF CLINIC NOTE   PCP: Kip Righter, MD Cardiology: Dr. Rolan  Chief complaint: CAD  78 y.o. with history of CAD, diastolic CHF, and exertional dyspnea was self-referred to CHF clinic.  I take care of her husband, Alyssha Housh.  She has struggled with exertional dyspnea for the last several months.  Last echo in 1/19 showed vigorous LV systolic function with a mild intracavitary gradient.  Given exertional dyspnea and chest pressure, she had LHC in 5/21, showing 95% pRCA stenosis that was treated with DES.   Despite PCI, she did not feel much better.  She continued to have exertional dyspnea and chest pressure.  Her chest pressure was nonexertional and happened at random.  Echo was done in 6/21, showing hyperdynamic LV with EF 65-70%, moderate LVH, normal RV. Toprol  XL increased to see if this would help her diastolic function and also started her on low dose Lasix  20 mg daily.  Creatinine rose to 2.33 and Lasix  was later stopped, creatinine dropped back to 1.08.    Cardiolite in 3/22 was a low risk study.   She was unable to tolerate Jardiance  due to recurrent yeast infections.   Echo in 3/23 showed EF 63%, mild-moderate MR, RV normal, IVC normal.   Follow up 8/23 she was having episodes of fatigue/sweating with tachycardia, along with episodes of chest heaviness and DOE.  14 day Zio placed to quantify palpitations, this showed short atrial tachycardia runs. Decision made to undergo [facility] which showed near normal filling pressures, patent RCA stent, serial 50% proximal LAD stenoses, 80% ostial stenosis of a small D1. No change from prior study in the LAD/D1, does not appear to have interventional target. Would treat D1 medically.  6/24 renal artery dopplers showed no renal artery stenosis.   Today she returns for followup of CAD.  Weight down 1 lb.  She reports more dyspnea and fatigue. She has been short of breath walking up stairs and gets tired very easily with housework.  Generally ok walking on flat ground.  She has some pressure in her chest at times, this is not clearly exertional.  She had an episode where she woke up short of breath last week. This has only happened once.  This all coincides with increased emotional stress.    ECG (personally reviewed): NSR, LAFB.   Labs (1/24): K 3.9, creatinine 1.2 Labs (5/24): K 4.3, Scr 1.14, LDL 12 Labs (2/25): K 3.9, creatinine 1.11 Labs (3/25): LDL 44 Labs (7/25): LFTs normal, K 4.3, creatinine 1.35  PMH:  1. Diastolic CHF: Echo in 1/19 with EF 65-70%, mild LV intracavitary gradient, mild LVH, very mild AS, RV size and systolic function normal.   - Echo (6/21): EF 65-70%, moderate LVH, normal RV, mild MR.  - Echo (3/23): EF 63%, mild-moderate MR, RV normal, IVC normal.  - R/LHC (9/23): RA 4, PA 31/9 (mean 20), PCW mean 15, CI (Fick)  - PFTs 6/24 no COPD 2. Hyperlipidemia; Myalgias with atorvastatin, tolerates low dose Crestor .  3. CAD: LHC in 2017 with moderate nonobstructive disease.  - LHC (5/21): 70% D1, 95% pRCA, 60% mLAD, 60% dLAD. DES to RCA.  - Cardiolite (3/22): small partially reversible mid anterior defect, low risk and possibly shifting breast artifact.  - R/LHC (9/23): near normal filling pressures, RA mean 4, PA 31/9 (mean 15), PCWP 15, CO/CI (Fick) Patent RCA stent, serial 50% proximal LAD stenoses, 80% ostial stenosis of a small D1. No change from prior study in the LAD/D1, does not  appear to have interventional target. Would treat D1 medically. 4. HTN: Edema with amlodipine .  - Renal artery dopplers in 3/22: No evidence for renal artery stenosis.  - Renal artery dopplers 6/24: no RAS 5. Gastroparesis 6. OSA 7. CKD stage 3 8. Palpitations: Zio 14 day (9/23) showed mostly NSR, several runs of SVT (atrial tachycardia), no AF or worrisome arrhythmias.  ROS: All systems reviewed and negative except as per HPI.   Social History   Socioeconomic History   Marital status: Married    Spouse name:  Not on file   Number of children: Not on file   Years of education: Not on file   Highest education level: Not on file  Occupational History   Not on file  Tobacco Use   Smoking status: Never   Smokeless tobacco: Never  Vaping Use   Vaping status: Never Used  Substance and Sexual Activity   Alcohol  use: No    Alcohol /week: 0.0 standard drinks of alcohol    Drug use: No   Sexual activity: Not Currently  Other Topics Concern   Not on file  Social History Narrative   Not on file   Social Drivers of Health   Financial Resource Strain: Not on file  Food Insecurity: Patient Declined (06/24/2022)   Hunger Vital Sign    Worried About Running Out of Food in the Last Year: Patient declined    Ran Out of Food in the Last Year: Patient declined  Transportation Needs: No Transportation Needs (06/24/2022)   PRAPARE - Administrator, Civil Service (Medical): No    Lack of Transportation (Non-Medical): No  Physical Activity: Not on file  Stress: Not on file  Social Connections: Unknown (10/13/2021)   Received from [facility]   Social Network    Social Network: Not on file  Intimate Partner Violence: Unknown (06/24/2022)   Humiliation, Afraid, Rape, and Kick questionnaire    Fear of Current or Ex-Partner: No    Emotionally Abused: Patient declined    Physically Abused: Patient declined    Sexually Abused: Patient declined   Family History  Problem Relation Age of Onset   Hypertension Mother    Heart disease Mother    Heart attack Father    Stroke Father    Diabetes Sister    Hypertension Sister    Stroke Other    Diabetes Other    Heart disease Other    Current Outpatient Medications  Medication Sig Dispense Refill   acetaminophen  (TYLENOL ) 500 MG tablet Take 2 tablets (1,000 mg total) by mouth every 6 (six) hours as needed for mild pain or moderate pain. 60 tablet 0   aspirin  EC 81 MG tablet Take 81 mg by mouth daily. Swallow whole.     cetirizine (ZYRTEC) 10 MG  chewable tablet Chew 10 mg by mouth as needed for allergies.     Cyanocobalamin  (B-12) 2500 MCG TABS Take 2,500 mcg by mouth daily.     DULoxetine  (CYMBALTA ) 20 MG capsule Take 20 mg by mouth 2 (two) times daily.      ENTRESTO  97-103 MG TAKE 1 TABLET TWICE A DAY 180 tablet 3   escitalopram  (LEXAPRO ) 20 MG tablet Take 20 mg by mouth at bedtime.     esomeprazole (NEXIUM) 40 MG capsule Take 40 mg by mouth daily.     fluticasone  (FLONASE ) 50 MCG/ACT nasal spray Place 2 sprays into both nostrils daily as needed for allergies (seasonal allergies).      hydrOXYzine  (ATARAX ) 50  MG tablet Take 50 mg by mouth at bedtime.     metoprolol  succinate (TOPROL -XL) 100 MG 24 hr tablet TAKE 1 TABLET TWICE A DAY,TAKE WITH OR IMMEDIATELY FOLLOWING A MEAL (DISCONTINUE METOPROLOL  TARTRATE) 180 tablet 3   nitroGLYCERIN  (NITROSTAT ) 0.4 MG SL tablet DISSOLVE 1 TABLET UNDER THE TONGUE EVERY 5 MINUTES AS NEEDED FOR CHEST PAIN, AS DIRECTED. 25 tablet 11   ondansetron  (ZOFRAN ) 4 MG tablet Take 4 mg by mouth as needed for nausea or vomiting.     oxyCODONE  (OXYCONTIN ) 10 mg 12 hr tablet Take 10 mg by mouth in the morning, at noon, in the evening, and at bedtime.     REPATHA  SURECLICK 140 MG/ML SOAJ INJECT 1 PEN UNDER THE SKIN EVERY 14 DAYS 6 mL 3   rosuvastatin  (CRESTOR ) 10 MG tablet TAKE 1 TABLET AT BEDTIME 90 tablet 3   hydrALAZINE  (APRESOLINE ) 25 MG tablet Take 1.5 tablets (37.5 mg total) by mouth 3 (three) times daily. 220 tablet 3   isosorbide  mononitrate (IMDUR ) 60 MG 24 hr tablet Take 1 tablet (60 mg total) by mouth daily. 90 tablet 3   spironolactone  (ALDACTONE ) 25 MG tablet Take 1 tablet (25 mg total) by mouth daily. 90 tablet 3   No current facility-administered medications for this encounter.   Wt Readings from Last 3 Encounters:  02/10/24 78.1 kg (172 lb 3.2 oz)  08/24/23 78.5 kg (173 lb)  02/17/23 80.8 kg (178 lb 3.2 oz)   BP 112/70   Pulse (!) 58   Wt 78.1 kg (172 lb 3.2 oz)   SpO2 96%   BMI 27.79 kg/m    Physical exam: General: NAD Neck: No JVD, no thyromegaly or thyroid  nodule.  Lungs: Clear to auscultation bilaterally with normal respiratory effort. CV: Nondisplaced PMI.  Heart regular S1/S2, no S3/S4, 2/6 SEM RUSB with clear S2.  No peripheral edema.  No carotid bruit.  Normal pedal pulses.  Abdomen: Soft, nontender, no hepatosplenomegaly, no distention.  Skin: Intact without lesions or rashes.  Neurologic: Alert and oriented x 3.  Psych: Normal affect. Extremities: No clubbing or cyanosis.  HEENT: Normal.   Assessment/Plan:  1. Chronic diastolic CHF: Echo in 1/19 showed vigorous LV systolic function EF 65-70% with intracavity gradient.  Echo in 6/21 showed EF 65-70% with moderate LVH, normal RV.  Echo in 3/23 showed EF 63%, mild-moderate MR, RV normal, IVC normal.  RHC (9/23) showed normal filling pressures. PFTs 6/24 no COPD. She is not volume overloaded on exam but has noted increased exertional dyspnea, NYHA class II.   - Unable to tolerate Jardiance  due to yeast infections.  - Continue Entresto  97/103 mg bid. BMET/BNP today. - Continue spironolactone  25 mg daily. - She does not appear to need Lasix .  - I will arrange for echo.  2. Coronary artery disease: DES to RCA in 5/21. Cardiolite in 3/22 was low risk (small partially reversible mid-anterior defect may be shifting breast artifact). LHC (9/23) showed patent RCA stent, serial 50% proximal LAD stenoses, 80% ostial stenosis of a small D1. No change from prior study in the LAD/D1, did not appear to have interventional target. She has had increased exertional dyspnea recently, has also had atypical chest pain.  - I will arrange for cardiac PET to assess for significant ischemia.   - Continue Imdur  60 mg daily and Toprol  XL 100 mg bid.  - Continue ASA 81.  - Continue Crestor  10 mg daily and Repatha .  Good lipids in 3/25.   3. CKD stage 3:  -  BMET today.  4. HTN: Normal renal artery dopplers in 6/24.  SBP < 130 at home.  - She  cannot tolerate amlodipine  due to swelling.  - Continue hydralazine  37.5 tid.  - Continue Entresto  97/103 bid - Continue spironolactone  25 daily.  5. OSA: Cannot use CPAP. 6. Atrial tachycardia: Short runs noted on Zio monitor 9/23.  Rare palpitations.  - Continue Toprol  XL 100 mg bid.   Follow up in 4 months with APP if cardiac PET is low risk.   I spent 32 minutes reviewing data, interviewing patient and his family, and organizing the orders/followup.  Angela [name]  02/11/2024

## 2024-02-16 ENCOUNTER — Ambulatory Visit (HOSPITAL_COMMUNITY)
Admission: RE | Admit: 2024-02-16 | Discharge: 2024-02-16 | Disposition: A | Discharge status: Home / Self Care | Source: Ambulatory Visit | Attending: Cardiology | Admitting: Cardiology

## 2024-02-16 DIAGNOSIS — I110 Hypertensive heart disease with heart failure: Secondary | ICD-10-CM | POA: Diagnosis not present

## 2024-02-16 DIAGNOSIS — I2510 Atherosclerotic heart disease of native coronary artery without angina pectoris: Secondary | ICD-10-CM | POA: Insufficient documentation

## 2024-02-16 DIAGNOSIS — E785 Hyperlipidemia, unspecified: Secondary | ICD-10-CM | POA: Insufficient documentation

## 2024-02-16 DIAGNOSIS — I5032 Chronic diastolic (congestive) heart failure: Secondary | ICD-10-CM | POA: Diagnosis present

## 2024-02-16 DIAGNOSIS — R0602 Shortness of breath: Secondary | ICD-10-CM | POA: Diagnosis not present

## 2024-02-16 DIAGNOSIS — G4730 Sleep apnea, unspecified: Secondary | ICD-10-CM | POA: Insufficient documentation

## 2024-02-16 DIAGNOSIS — I081 Rheumatic disorders of both mitral and tricuspid valves: Secondary | ICD-10-CM | POA: Diagnosis not present

## 2024-02-16 LAB — ECHOCARDIOGRAM COMPLETE
AR max vel: 1.95 cm2
AV Area VTI: 2.04 cm2
AV Area mean vel: 1.98 cm2
AV Mean grad: 5 mmHg
AV Peak grad: 10.1 mmHg
Ao pk vel: 1.59 m/s
Area-P 1/2: 4.21 cm2
S' Lateral: 2.46 cm

## 2024-02-17 ENCOUNTER — Telehealth (HOSPITAL_COMMUNITY): Payer: Self-pay | Admitting: *Deleted

## 2024-02-17 NOTE — Telephone Encounter (Signed)
 Called patient per Dr. Rolan with following echo results:  EF 65-70% with moderate LVH and moderate MR.  Pt verbalized understanding of same. No further questions at this time.

## 2024-03-14 ENCOUNTER — Encounter (HOSPITAL_COMMUNITY): Payer: Self-pay

## 2024-03-15 ENCOUNTER — Telehealth (HOSPITAL_COMMUNITY): Payer: Self-pay | Admitting: Emergency Medicine

## 2024-03-15 NOTE — Telephone Encounter (Signed)
 Reaching out to patient to offer assistance regarding upcoming cardiac imaging study; pt verbalizes understanding of appt date/time, parking situation and where to check in, pre-test NPO status and medications ordered, and verified current allergies; name and call back number provided for further questions should they arise Rockwell Alexandria RN Navigator Cardiac Imaging [facility] [phone] office [phone] cell

## 2024-03-16 ENCOUNTER — Encounter (HOSPITAL_COMMUNITY)
Admission: RE | Admit: 2024-03-16 | Discharge: 2024-03-16 | Disposition: A | Discharge status: Home / Self Care | Source: Ambulatory Visit | Attending: Cardiology | Admitting: Cardiology

## 2024-03-16 DIAGNOSIS — I5032 Chronic diastolic (congestive) heart failure: Secondary | ICD-10-CM | POA: Insufficient documentation

## 2024-03-16 LAB — NM PET CT CARDIAC PERFUSION MULTI W/ABSOLUTE BLOODFLOW
MBFR: 1.46
Nuc Rest EF: 60 %
Nuc Stress EF: 71 %
Rest MBF: 1.33 ml/g/min
Rest Nuclear Isotope Dose: 19.7 mCi
ST Depression (mm): 0 mm
Stress MBF: 1.94 ml/g/min
Stress Nuclear Isotope Dose: 19.6 mCi
TID: 1.71

## 2024-03-16 MED ORDER — REGADENOSON 0.4 MG/5ML IV SOLN
0.4000 mg | Freq: Once | INTRAVENOUS | Status: AC
Start: 2024-03-16 — End: 2024-03-16
  Administered 2024-03-16: 0.4 mg via INTRAVENOUS

## 2024-03-16 MED ORDER — REGADENOSON 0.4 MG/5ML IV SOLN
INTRAVENOUS | Status: AC
  Filled 2024-03-16: qty 5

## 2024-03-16 MED ORDER — RUBIDIUM RB82 GENERATOR (RUBYFILL)
19.7100 | PACK | Freq: Once | INTRAVENOUS | Status: AC
  Administered 2024-03-16: 19.71 via INTRAVENOUS

## 2024-03-16 MED ORDER — RUBIDIUM RB82 GENERATOR (RUBYFILL)
19.5700 | PACK | Freq: Once | INTRAVENOUS | Status: AC
  Administered 2024-03-16: 19.57 via INTRAVENOUS

## 2024-03-24 ENCOUNTER — Other Ambulatory Visit (HOSPITAL_COMMUNITY): Payer: Self-pay

## 2024-03-24 DIAGNOSIS — I2510 Atherosclerotic heart disease of native coronary artery without angina pectoris: Secondary | ICD-10-CM

## 2024-03-24 NOTE — Progress Notes (Signed)
 Orders placed for [facility] with Dr. Rolan on 10/31.

## 2024-04-01 ENCOUNTER — Encounter (HOSPITAL_COMMUNITY)
Admission: RE | Disposition: A | Discharge status: Home / Self Care | Payer: Self-pay | Source: Home / Self Care | Attending: Cardiology

## 2024-04-01 ENCOUNTER — Other Ambulatory Visit: Payer: Self-pay

## 2024-04-01 ENCOUNTER — Ambulatory Visit (HOSPITAL_COMMUNITY)
Admission: RE | Admit: 2024-04-01 | Discharge: 2024-04-01 | Disposition: A | Discharge status: Home / Self Care | Attending: Cardiology | Admitting: Cardiology

## 2024-04-01 DIAGNOSIS — Z955 Presence of coronary angioplasty implant and graft: Secondary | ICD-10-CM | POA: Diagnosis not present

## 2024-04-01 DIAGNOSIS — Z79899 Other long term (current) drug therapy: Secondary | ICD-10-CM | POA: Insufficient documentation

## 2024-04-01 DIAGNOSIS — I2510 Atherosclerotic heart disease of native coronary artery without angina pectoris: Secondary | ICD-10-CM | POA: Diagnosis not present

## 2024-04-01 DIAGNOSIS — R0609 Other forms of dyspnea: Secondary | ICD-10-CM | POA: Diagnosis not present

## 2024-04-01 DIAGNOSIS — I509 Heart failure, unspecified: Secondary | ICD-10-CM | POA: Diagnosis not present

## 2024-04-01 DIAGNOSIS — I110 Hypertensive heart disease with heart failure: Secondary | ICD-10-CM | POA: Diagnosis not present

## 2024-04-01 HISTORY — PX: RIGHT/LEFT HEART CATH AND CORONARY ANGIOGRAPHY: CATH118266

## 2024-04-01 LAB — POCT I-STAT EG7
Acid-base deficit: 2 mmol/L (ref 0.0–2.0)
Acid-base deficit: 2 mmol/L (ref 0.0–2.0)
Bicarbonate: 24 mmol/L (ref 20.0–28.0)
Bicarbonate: 24.2 mmol/L (ref 20.0–28.0)
Calcium, Ion: 1.19 mmol/L (ref 1.15–1.40)
Calcium, Ion: 1.25 mmol/L (ref 1.15–1.40)
HCT: 35 % — ABNORMAL LOW (ref 36.0–46.0)
HCT: 35 % — ABNORMAL LOW (ref 36.0–46.0)
Hemoglobin: 11.9 g/dL — ABNORMAL LOW (ref 12.0–15.0)
Hemoglobin: 11.9 g/dL — ABNORMAL LOW (ref 12.0–15.0)
O2 Saturation: 62 %
O2 Saturation: 63 %
Potassium: 5.4 mmol/L — ABNORMAL HIGH (ref 3.5–5.1)
Potassium: 5.5 mmol/L — ABNORMAL HIGH (ref 3.5–5.1)
Sodium: 140 mmol/L (ref 135–145)
Sodium: 141 mmol/L (ref 135–145)
TCO2: 25 mmol/L (ref 22–32)
TCO2: 26 mmol/L (ref 22–32)
pCO2, Ven: 44.5 mmHg (ref 44–60)
pCO2, Ven: 44.6 mmHg (ref 44–60)
pH, Ven: 7.339 (ref 7.25–7.43)
pH, Ven: 7.343 (ref 7.25–7.43)
pO2, Ven: 35 mmHg (ref 32–45)
pO2, Ven: 35 mmHg (ref 32–45)

## 2024-04-01 LAB — CBC
HCT: 38.3 % (ref 36.0–46.0)
Hemoglobin: 12.6 g/dL (ref 12.0–15.0)
MCH: 28.8 pg (ref 26.0–34.0)
MCHC: 32.9 g/dL (ref 30.0–36.0)
MCV: 87.6 fL (ref 80.0–100.0)
Platelets: 236 K/uL (ref 150–400)
RBC: 4.37 MIL/uL (ref 3.87–5.11)
RDW: 12.4 % (ref 11.5–15.5)
WBC: 6.7 K/uL (ref 4.0–10.5)
nRBC: 0 % (ref 0.0–0.2)

## 2024-04-01 LAB — BASIC METABOLIC PANEL WITH GFR
Anion gap: 13 (ref 5–15)
BUN: 32 mg/dL — ABNORMAL HIGH (ref 8–23)
CO2: 21 mmol/L — ABNORMAL LOW (ref 22–32)
Calcium: 9 mg/dL (ref 8.9–10.3)
Chloride: 104 mmol/L (ref 98–111)
Creatinine, Ser: 1.92 mg/dL — ABNORMAL HIGH (ref 0.44–1.00)
GFR, Estimated: 27 mL/min — ABNORMAL LOW (ref >60)
Glucose, Bld: 109 mg/dL — ABNORMAL HIGH (ref 70–99)
Potassium: 5.3 mmol/L — ABNORMAL HIGH (ref 3.5–5.1)
Sodium: 138 mmol/L (ref 135–145)

## 2024-04-01 SURGERY — RIGHT/LEFT HEART CATH AND CORONARY ANGIOGRAPHY
Anesthesia: LOCAL

## 2024-04-01 MED ORDER — HEPARIN (PORCINE) IN NACL 1000-0.9 UT/500ML-% IV SOLN
INTRAVENOUS | Status: DC | PRN
Start: 2024-04-01 — End: 2024-04-01
  Administered 2024-04-01 (×2): 500 mL

## 2024-04-01 MED ORDER — FREE WATER: 250.0000 mL | Freq: Once | Status: DC

## 2024-04-01 MED ORDER — HEPARIN SODIUM (PORCINE) 1000 UNIT/ML IJ SOLN
INTRAMUSCULAR | Status: DC | PRN
Start: 2024-04-01 — End: 2024-04-01
  Administered 2024-04-01: 3500 [IU] via INTRAVENOUS

## 2024-04-01 MED ORDER — VERAPAMIL HCL 2.5 MG/ML IV SOLN
INTRAVENOUS | Status: DC | PRN
  Administered 2024-04-01: 10 mL via INTRA_ARTERIAL

## 2024-04-01 MED ORDER — SODIUM CHLORIDE 0.9 % IV SOLN
INTRAVENOUS | Status: DC | PRN
  Administered 2024-04-01: 75 mL/h via INTRAVENOUS

## 2024-04-01 MED ORDER — VERAPAMIL HCL 2.5 MG/ML IV SOLN
INTRAVENOUS | Status: AC
Start: 2024-04-01 — End: 2024-04-01
  Filled 2024-04-01: qty 2

## 2024-04-01 MED ORDER — FENTANYL CITRATE (PF) 100 MCG/2ML IJ SOLN
INTRAMUSCULAR | Status: DC | PRN
  Administered 2024-04-01: 25 ug via INTRAVENOUS

## 2024-04-01 MED ORDER — ASPIRIN 81 MG PO CHEW
81.0000 mg | CHEWABLE_TABLET | Freq: Once | ORAL | Status: AC
  Administered 2024-04-01: 81 mg via ORAL
  Filled 2024-04-01: qty 1

## 2024-04-01 MED ORDER — SODIUM CHLORIDE 0.9% FLUSH: 3.0000 mL | INTRAVENOUS | Status: DC | PRN

## 2024-04-01 MED ORDER — IOHEXOL 350 MG/ML SOLN
INTRAVENOUS | Status: DC | PRN
  Administered 2024-04-01: 25 mL

## 2024-04-01 MED ORDER — MIDAZOLAM HCL 2 MG/2ML IJ SOLN
INTRAMUSCULAR | Status: AC
  Filled 2024-04-01: qty 2

## 2024-04-01 MED ORDER — LIDOCAINE HCL (PF) 1 % IJ SOLN
INTRAMUSCULAR | Status: DC | PRN
  Administered 2024-04-01 (×2): 2 mL via INTRADERMAL

## 2024-04-01 MED ORDER — SODIUM CHLORIDE 0.9 % IV SOLN: 250.0000 mL | INTRAVENOUS | Status: DC | PRN

## 2024-04-01 MED ORDER — LIDOCAINE HCL (PF) 1 % IJ SOLN
INTRAMUSCULAR | Status: AC
  Filled 2024-04-01: qty 30

## 2024-04-01 MED ORDER — MIDAZOLAM HCL (PF) 2 MG/2ML IJ SOLN
INTRAMUSCULAR | Status: DC | PRN
Start: 2024-04-01 — End: 2024-04-01
  Administered 2024-04-01: 1 mg via INTRAVENOUS

## 2024-04-01 MED ORDER — FENTANYL CITRATE (PF) 100 MCG/2ML IJ SOLN
INTRAMUSCULAR | Status: AC
  Filled 2024-04-01: qty 2

## 2024-04-01 MED ORDER — HEPARIN SODIUM (PORCINE) 1000 UNIT/ML IJ SOLN
INTRAMUSCULAR | Status: AC
  Filled 2024-04-01: qty 10

## 2024-04-01 MED ORDER — SODIUM CHLORIDE 0.9% FLUSH: 3.0000 mL | Freq: Two times a day (BID) | INTRAVENOUS | Status: DC

## 2024-04-01 SURGICAL SUPPLY — 10 items
CATH 5FR JL3.5 JR4 ANG PIG MP (CATHETERS) IMPLANT
CATH BALLN WEDGE 5F 110CM (CATHETERS) IMPLANT
DEVICE RAD COMP TR BAND LRG (VASCULAR PRODUCTS) IMPLANT
GLIDESHEATH SLEND SS 6F .021 (SHEATH) IMPLANT
GUIDEWIRE INQWIRE 1.5J.035X260 (WIRE) IMPLANT
PACK CARDIAC CATHETERIZATION (CUSTOM PROCEDURE TRAY) ×1 IMPLANT
SHEATH GLIDE SLENDER 4/5FR (SHEATH) IMPLANT
SHEATH PROBE COVER 6X72 (BAG) IMPLANT
TRANSDUCER W/STOPCOCK (MISCELLANEOUS) IMPLANT
TUBING CIL FLEX 10 FLL-RA (TUBING) IMPLANT

## 2024-04-01 NOTE — H&P (Signed)
 Advanced Heart Failure Team History and Physical Note   PCP:  Kip Righter, MD  PCP-Cardiology: Leim Moose, MD     Reason for Admission: cardiac cath   HPI:    Patient has reported increased exertional dyspnea and atypical chest pain.  Cardiac PET was done, showing LAD territory ischemia.  The ischemic area was small but TID was noted and there was a significant decrease in myocardial blood flow reserved with concern for high risk study.   Creatinine up to 1.9 today. She has not had any medication changes and is not taking Lasix .    Home Medications Prior to Admission medications   Medication Sig Start Date End Date Taking? Authorizing Provider  acetaminophen  (TYLENOL ) 500 MG tablet Take 2 tablets (1,000 mg total) by mouth every 6 (six) hours as needed for mild pain or moderate pain. 06/26/22  Yes Gawne, Meghan M, PA-C  amoxicillin (AMOXIL) 500 MG tablet Take 2,000 mg by mouth once. 02/18/24  Yes [provider]  aspirin  EC 81 MG tablet Take 81 mg by mouth daily. Swallow whole.   Yes [provider]  cetirizine (ZYRTEC) 10 MG chewable tablet Chew 10 mg by mouth as needed for allergies.   Yes [provider]  DULoxetine  (CYMBALTA ) 20 MG capsule Take 20 mg by mouth 2 (two) times daily.  03/26/17  Yes [provider]  ENTRESTO  97-103 MG TAKE 1 TABLET TWICE A DAY 12/10/23  Yes Rolan Ezra RAMAN, MD  escitalopram  (LEXAPRO ) 20 MG tablet Take 20 mg by mouth at bedtime.   Yes [provider]  esomeprazole (NEXIUM) 40 MG capsule Take 40 mg by mouth in the morning.   Yes [provider]  fluticasone  (FLONASE ) 50 MCG/ACT nasal spray Place 2 sprays into both nostrils daily as needed for allergies (seasonal allergies).    Yes [provider]  furosemide  (LASIX ) 20 MG tablet Take 20 mg by mouth daily as needed for fluid or edema.   Yes [provider]  hydrALAZINE  (APRESOLINE ) 25 MG tablet Take 1.5 tablets (37.5 mg total) by  mouth 3 (three) times daily. 02/10/24  Yes Rolan Ezra RAMAN, MD  hydrOXYzine  (ATARAX ) 50 MG tablet Take 50-100 mg by mouth at bedtime. 02/14/22  Yes [provider]  isosorbide  mononitrate (IMDUR ) 60 MG 24 hr tablet Take 1 tablet (60 mg total) by mouth daily. 02/10/24  Yes Rolan Ezra RAMAN, MD  meloxicam (MOBIC) 15 MG tablet Take 15 mg by mouth daily.   Yes [provider]  metoprolol  succinate (TOPROL -XL) 100 MG 24 hr tablet TAKE 1 TABLET TWICE A DAY,TAKE WITH OR IMMEDIATELY FOLLOWING A MEAL (DISCONTINUE METOPROLOL  TARTRATE) 08/26/23  Yes Rolan Ezra RAMAN, MD  nitroGLYCERIN  (NITROSTAT ) 0.4 MG SL tablet DISSOLVE 1 TABLET UNDER THE TONGUE EVERY 5 MINUTES AS NEEDED FOR CHEST PAIN, AS DIRECTED. 12/10/23  Yes Rolan Ezra RAMAN, MD  ondansetron  (ZOFRAN ) 4 MG tablet Take 4 mg by mouth every 8 (eight) hours as needed for nausea or vomiting.   Yes [provider]  oxyCODONE -acetaminophen  (PERCOCET) 10-325 MG tablet Take 1 tablet by mouth every 6 (six) hours.   Yes [provider]  REPATHA  SURECLICK 140 MG/ML SOAJ INJECT 1 PEN UNDER THE SKIN EVERY 14 DAYS 08/26/23  Yes Rolan Ezra RAMAN, MD  rOPINIRole  (REQUIP ) 2 MG tablet Take 2 mg by mouth 2 (two) times daily.   Yes [provider]  rosuvastatin  (CRESTOR ) 10 MG tablet TAKE 1 TABLET AT BEDTIME 12/10/23  Yes Rolan Ezra RAMAN,  MD  spironolactone  (ALDACTONE ) 25 MG tablet Take 1 tablet (25 mg total) by mouth daily. 02/10/24  Yes Rolan Ezra RAMAN, MD  tiZANidine  (ZANAFLEX ) 4 MG tablet Take 4 mg by mouth every 8 (eight) hours as needed for muscle spasms. 09/02/18  Yes [provider]    Past Medical History: Past Medical History:  Diagnosis Date   Anxiety    Cancer of sigmoid (HCC)    CHF (congestive heart failure) (HCC)    Depression (emotion)    DJD (degenerative joint disease)    Fibromyalgia    GERD (gastroesophageal reflux disease)    Hemorrhoids    Hyperlipidemia    Hypertension    IBS (irritable bowel  syndrome)    MRSA (methicillin resistant Staphylococcus aureus) septicemia (HCC)    history of   OSA    stopped CPAP, wants to have ASPIRE   Over weight    Pancreatitis    Pneumonia    PONV (postoperative nausea and vomiting)    RLS (restless legs syndrome)    Tachycardia     Past Surgical History: Past Surgical History:  Procedure Laterality Date   ABDOMINAL HYSTERECTOMY     APPENDECTOMY     CARDIAC CATHETERIZATION [name] 12/26/2015   Procedure: Left Heart Cath and Coronary Angiography;  Surgeon: Debby DELENA Sor, MD;  Location: [facility] INVASIVE CV LAB;  Service: Cardiovascular;  Laterality: [name];   CERVICAL SPINE SURGERY     has had 5 different surgeries including ACDF  C3-5   child birth     CHOLECYSTECTOMY     COLON SURGERY     resection due to colon Ca, no ostomy   CORONARY STENT INTERVENTION [name] 10/28/2019   Procedure: CORONARY STENT INTERVENTION;  Surgeon: Verlin Lonni BIRCH, MD;  Location: [facility] INVASIVE CV LAB;  Service: Cardiovascular;  Laterality: [name];   EYE SURGERY Bilateral    cataracts removed, lens implanted   JOINT REPLACEMENT Right 2012   by Dr. Jane   LEFT HEART CATH AND CORONARY ANGIOGRAPHY [name] 10/28/2019   Procedure: LEFT HEART CATH AND CORONARY ANGIOGRAPHY;  Surgeon: Verlin Lonni BIRCH, MD;  Location: [facility] INVASIVE CV [facility];  Service: Cardiovascular;  Laterality: [name];   LEFT OOPHORECTOMY     for torsion   RIGHT/LEFT HEART CATH AND CORONARY ANGIOGRAPHY [name] 02/07/2022   Procedure: RIGHT/LEFT HEART CATH AND CORONARY ANGIOGRAPHY;  Surgeon: Rolan Ezra RAMAN, MD;  Location: [facility] INVASIVE CV LAB;  Service: Cardiovascular;  Laterality: [name];   TONSILLECTOMY     TOTAL KNEE ARTHROPLASTY Left 06/24/2022   Procedure: TOTAL KNEE ARTHROPLASTY;  Surgeon: Beverley Evalene BIRCH, MD;  Location: [facility];  Service: Orthopedics;  Laterality: Left;   TUBAL LIGATION      Family History:  Family History  Problem Relation Age of Onset   Hypertension Mother    Heart disease Mother     Heart attack Father    Stroke Father    Diabetes Sister    Hypertension Sister    Stroke Other    Diabetes Other    Heart disease Other     Social History: Social History   Socioeconomic History   Marital status: Married    Spouse name: Not on file   Number of children: Not on file   Years of education: Not on file   Highest education level: Not on file  Occupational History   Not on file  Tobacco Use   Smoking status: Never   Smokeless tobacco: Never  Vaping Use   Vaping status: Never  Used  Substance and Sexual Activity   Alcohol  use: No    Alcohol /week: 0.0 standard drinks of alcohol    Drug use: No   Sexual activity: Not Currently  Other Topics Concern   Not on file  Social History Narrative   Not on file   Social Drivers of Health   Financial Resource Strain: Not on file  Food Insecurity: Patient Declined (06/24/2022)   Hunger Vital Sign    Worried About Running Out of Food in the Last Year: Patient declined    Ran Out of Food in the Last Year: Patient declined  Transportation Needs: No Transportation Needs (06/24/2022)   PRAPARE - Administrator, Civil Service (Medical): No    Lack of Transportation (Non-Medical): No  Physical Activity: Not on file  Stress: Not on file  Social Connections: Unknown (10/13/2021)   [facility]   Social Network    Social Network: Not on file    Allergies:  Allergies  Allergen Reactions   Gabapentin     Other Reaction(s): Psychosis  Confusion, ataxia, gait disturbance   Phenergan [Promethazine] Other (See Comments)    Restlessness    Timentin [Ticarcillin-Pot Clavulanate] Anaphylaxis    Has patient had a PCN reaction causing immediate rash, facial/tongue/throat swelling, SOB or lightheadedness with hypotension: Yes Has patient had a PCN reaction causing severe rash involving mucus membranes or skin necrosis: Yes Has patient had a PCN reaction that required hospitalization Yes Has patient had a  PCN reaction occurring within the last 10 years: No If all of the above answers are NO, then may proceed with Cephalosporin use.    Pregabalin Other (See Comments)    Mouth sores   Norvasc  [Amlodipine ] Swelling   Reglan  [Metoclopramide ] Other (See Comments)    Cannot sit still    Objective:    Vital Signs:   Temp:  [98.3 F (36.8 C)] 98.3 F (36.8 C) (10/31 0904) Pulse Rate:  [63] 63 (10/31 0904) BP: (130)/(61) 130/61 (10/31 0904) SpO2:  [95 %] 95 % (10/31 0904) Weight:  [75.4 kg] 75.4 kg (10/31 0904)   Filed Weights   04/01/24 0904  Weight: 75.4 kg     Physical Exam     General:  Well appearing. No respiratory difficulty HEENT: Normal Neck: Supple. no JVD. Carotids 2+ bilat; no bruits. No lymphadenopathy or thyromegaly appreciated. Cor: PMI nondisplaced. Regular rate & rhythm. No rubs, gallops or murmurs. Lungs: Clear Abdomen: Soft, nontender, nondistended. No hepatosplenomegaly. No bruits or masses. Good bowel sounds. Extremities: No cyanosis, clubbing, rash, edema Neuro: Alert & oriented x 3, cranial nerves grossly intact. moves all 4 extremities w/o difficulty. Affect pleasant.   Telemetry   NSR   Labs     Basic Metabolic Panel: Recent Labs  Lab 04/01/24 0915  NA 138  K 5.3*  CL 104  CO2 21*  GLUCOSE 109*  BUN 32*  CREATININE 1.92*  CALCIUM  9.0    Liver Function Tests: No results for input(s): AST, ALT, ALKPHOS, BILITOT, PROT, ALBUMIN in the last 168 hours. No results for input(s): LIPASE, AMYLASE in the last 168 hours. No results for input(s): AMMONIA in the last 168 hours.  CBC: Recent Labs  Lab 04/01/24 0915  WBC 6.7  HGB 12.6  HCT 38.3  MCV 87.6  PLT 236    Cardiac Enzymes: No results for input(s): CKTOTAL, CKMB, CKMBINDEX, TROPONINI in the last 168 hours.  BNP: BNP (last 3 results) Recent Labs    07/21/23 1408 08/24/23  1438 02/10/24 1220  BNP 129.8* 230.0* 496.4*    ProBNP (last 3  results) No results for input(s): PROBNP in the last 8760 hours.   CBG: No results for input(s): GLUCAP in the last 168 hours.  Coagulation Studies: No results for input(s): LABPROT, INR in the last 72 hours.  Imaging: No results found.   Assessment/Plan   Plan for RHC/LHC today given exertional dyspnea and high risk cardiac PET scan.  With creatinine above baseline at 1.9, this will be a diagnostic study only unless there is a critical lesion that needs to be intervened on immediately. Will hydrate her with NS.    Ezra Shuck, MD 04/01/2024, 11:40 AM Total Time Spen Advanced Heart Failure Team Pager [phone] (M-F; 7a - 5p)  Please contact [facility] for night-coverage after hours (4p -7a ) and weekends on [email or web address]

## 2024-04-01 NOTE — Progress Notes (Signed)
 Patient and patient husband given discharge instructions, education provided no further questions at this time. Patient able to ambulate and void before discharge. Able to tolerate PO intake. Patient site is clean, dry, intact with no hematoma noted upon discharge.

## 2024-04-02 ENCOUNTER — Encounter (HOSPITAL_COMMUNITY): Payer: Self-pay | Admitting: Cardiology

## 2024-04-05 ENCOUNTER — Other Ambulatory Visit (HOSPITAL_COMMUNITY): Payer: Self-pay | Admitting: Cardiology

## 2024-04-05 DIAGNOSIS — I5032 Chronic diastolic (congestive) heart failure: Secondary | ICD-10-CM

## 2024-04-08 ENCOUNTER — Telehealth (HOSPITAL_COMMUNITY): Payer: Self-pay | Admitting: Cardiology

## 2024-04-08 ENCOUNTER — Ambulatory Visit (HOSPITAL_COMMUNITY)
Admission: RE | Admit: 2024-04-08 | Discharge: 2024-04-08 | Disposition: A | Discharge status: Home / Self Care | Source: Ambulatory Visit | Attending: Cardiology | Admitting: Cardiology

## 2024-04-08 DIAGNOSIS — I5032 Chronic diastolic (congestive) heart failure: Secondary | ICD-10-CM | POA: Insufficient documentation

## 2024-04-08 LAB — BASIC METABOLIC PANEL WITH GFR
Anion gap: 9 (ref 5–15)
BUN: 34 mg/dL — ABNORMAL HIGH (ref 8–23)
CO2: 23 mmol/L (ref 22–32)
Calcium: 8.8 mg/dL — ABNORMAL LOW (ref 8.9–10.3)
Chloride: 106 mmol/L (ref 98–111)
Creatinine, Ser: 2.32 mg/dL — ABNORMAL HIGH (ref 0.44–1.00)
GFR, Estimated: 21 mL/min — ABNORMAL LOW (ref >60)
Glucose, Bld: 120 mg/dL — ABNORMAL HIGH (ref 70–99)
Potassium: 4.9 mmol/L (ref 3.5–5.1)
Sodium: 138 mmol/L (ref 135–145)

## 2024-04-08 NOTE — Telephone Encounter (Signed)
 R distal artery evaluated with Brittany Simmons,PA Good pulses bilaterally No tingling or numbness  No concerns for hematoma/clot Hands normal in temperature  No pain noted  Patient advised bruising is normal  Continue to monitor

## 2024-04-08 NOTE — Telephone Encounter (Signed)
 Patient called to report bruising/discoloration at cath site. Reports area has spread in size since cath done. Area is now measuring 3 W x8L  Reports pulse is fine Very small lump present Area is tender and not painful  Pt reports she will send pictures of area   Per Dr Rolan pt will need to come by the office for evaluation-  Pt aware

## 2024-04-10 ENCOUNTER — Ambulatory Visit (HOSPITAL_COMMUNITY): Payer: Self-pay | Admitting: Cardiology

## 2024-04-11 ENCOUNTER — Ambulatory Visit (HOSPITAL_COMMUNITY)

## 2024-04-19 ENCOUNTER — Ambulatory Visit (HOSPITAL_COMMUNITY): Payer: Self-pay | Admitting: Cardiology

## 2024-04-19 ENCOUNTER — Ambulatory Visit (HOSPITAL_COMMUNITY)
Admission: RE | Admit: 2024-04-19 | Discharge: 2024-04-19 | Disposition: A | Discharge status: Home / Self Care | Source: Ambulatory Visit | Attending: Cardiology | Admitting: Cardiology

## 2024-04-19 DIAGNOSIS — I5032 Chronic diastolic (congestive) heart failure: Secondary | ICD-10-CM | POA: Insufficient documentation

## 2024-04-19 LAB — BASIC METABOLIC PANEL WITH GFR
Anion gap: 11 (ref 5–15)
BUN: 28 mg/dL — ABNORMAL HIGH (ref 8–23)
CO2: 25 mmol/L (ref 22–32)
Calcium: 8.9 mg/dL (ref 8.9–10.3)
Chloride: 102 mmol/L (ref 98–111)
Creatinine, Ser: 1.57 mg/dL — ABNORMAL HIGH (ref 0.44–1.00)
GFR, Estimated: 34 mL/min — ABNORMAL LOW (ref >60)
Glucose, Bld: 106 mg/dL — ABNORMAL HIGH (ref 70–99)
Potassium: 5.2 mmol/L — ABNORMAL HIGH (ref 3.5–5.1)
Sodium: 138 mmol/L (ref 135–145)

## 2024-04-22 NOTE — Telephone Encounter (Signed)
 Pt aware.

## 2024-06-08 NOTE — Progress Notes (Incomplete)
 "  ADVANCED HF CLINIC NOTE   PCP: Kip Righter, MD Cardiology: Dr. Rolan  Chief complaint: CAD  79 y.o. with history of CAD, diastolic CHF, and exertional dyspnea was self-referred to CHF clinic.  I take care of her husband, Bradi Arbuthnot.  She has struggled with exertional dyspnea for the last several months.  Last echo in 1/19 showed vigorous LV systolic function with a mild intracavitary gradient.  Given exertional dyspnea and chest pressure, she had LHC in 5/21, showing 95% pRCA stenosis that was treated with DES.   Despite PCI, she did not feel much better.  She continued to have exertional dyspnea and chest pressure.  Her chest pressure was nonexertional and happened at random.  Echo was done in 6/21, showing hyperdynamic LV with EF 65-70%, moderate LVH, normal RV. Toprol  XL increased to see if this would help her diastolic function and also started her on low dose Lasix  20 mg daily.  Creatinine rose to 2.33 and Lasix  was later stopped, creatinine dropped back to 1.08.    Cardiolite in 3/22 was a low risk study.   She was unable to tolerate Jardiance  due to recurrent yeast infections.   Echo in 3/23 showed EF 63%, mild-moderate MR, RV normal, IVC normal.   Follow up 8/23 she was having episodes of fatigue/sweating with tachycardia, along with episodes of chest heaviness and DOE.  14 day Zio placed to quantify palpitations, this showed short atrial tachycardia runs. Decision made to undergo [facility] which showed near normal filling pressures, patent RCA stent, serial 50% proximal LAD stenoses, 80% ostial stenosis of a small D1. No change from prior study in the LAD/D1, does not appear to have interventional target. Would treat D1 medically.  6/24 renal artery dopplers showed no renal artery stenosis.   Today she returns for followup of CAD.  Weight down 1 lb.  She reports more dyspnea and fatigue. She has been short of breath walking up stairs and gets tired very easily with housework.  Generally ok walking on flat ground.  She has some pressure in her chest at times, this is not clearly exertional.  She had an episode where she woke up short of breath last week. This has only happened once.  This all coincides with increased emotional stress.    ECG (personally reviewed): NSR, LAFB.   Labs (1/24): K 3.9, creatinine 1.2 Labs (5/24): K 4.3, Scr 1.14, LDL 12 Labs (2/25): K 3.9, creatinine 1.11 Labs (3/25): LDL 44 Labs (7/25): LFTs normal, K 4.3, creatinine 1.35  PMH:  1. Diastolic CHF: Echo in 1/19 with EF 65-70%, mild LV intracavitary gradient, mild LVH, very mild AS, RV size and systolic function normal.   - Echo (6/21): EF 65-70%, moderate LVH, normal RV, mild MR.  - Echo (3/23): EF 63%, mild-moderate MR, RV normal, IVC normal.  - R/LHC (9/23): RA 4, PA 31/9 (mean 20), PCW mean 15, CI (Fick)  - PFTs 6/24 no COPD 2. Hyperlipidemia; Myalgias with atorvastatin, tolerates low dose Crestor .  3. CAD: LHC in 2017 with moderate nonobstructive disease.  - LHC (5/21): 70% D1, 95% pRCA, 60% mLAD, 60% dLAD. DES to RCA.  - Cardiolite (3/22): small partially reversible mid anterior defect, low risk and possibly shifting breast artifact.  - R/LHC (9/23): near normal filling pressures, RA mean 4, PA 31/9 (mean 15), PCWP 15, CO/CI (Fick) Patent RCA stent, serial 50% proximal LAD stenoses, 80% ostial stenosis of a small D1. No change from prior study in the LAD/D1, does  not appear to have interventional target. Would treat D1 medically. 4. HTN: Edema with amlodipine .  - Renal artery dopplers in 3/22: No evidence for renal artery stenosis.  - Renal artery dopplers 6/24: no RAS 5. Gastroparesis 6. OSA 7. CKD stage 3 8. Palpitations: Zio 14 day (9/23) showed mostly NSR, several runs of SVT (atrial tachycardia), no AF or worrisome arrhythmias.  ROS: All systems reviewed and negative except as per HPI.   Social History   Socioeconomic History   Marital status: Married    Spouse name:  Not on file   Number of children: Not on file   Years of education: Not on file   Highest education level: Not on file  Occupational History   Not on file  Tobacco Use   Smoking status: Never   Smokeless tobacco: Never  Vaping Use   Vaping status: Never Used  Substance and Sexual Activity   Alcohol  use: No    Alcohol /week: 0.0 standard drinks of alcohol    Drug use: No   Sexual activity: Not Currently  Other Topics Concern   Not on file  Social History Narrative   Not on file   Social Drivers of Health   Tobacco Use: Low Risk (02/10/2024)   Patient History    Smoking Tobacco Use: Never    Smokeless Tobacco Use: Never    Passive Exposure: Not on file  Financial Resource Strain: Not on file  Food Insecurity: Patient Declined (06/24/2022)   Hunger Vital Sign    Worried About Running Out of Food in the Last Year: Patient declined    Ran Out of Food in the Last Year: Patient declined  Transportation Needs: No Transportation Needs (06/24/2022)   PRAPARE - Administrator, Civil Service (Medical): No    Lack of Transportation (Non-Medical): No  Physical Activity: Not on file  Stress: Not on file  Social Connections: Unknown (10/13/2021)   Received from [facility]   Social Network    Social Network: Not on file  Intimate Partner Violence: Unknown (06/24/2022)   Humiliation, Afraid, Rape, and Kick questionnaire    Fear of Current or Ex-Partner: No    Emotionally Abused: Patient declined    Physically Abused: Patient declined    Sexually Abused: Patient declined  Depression (PHQ2-9): Not on file  Alcohol  Screen: Not on file  Housing: Low Risk (06/24/2022)   Housing    Last Housing Risk Score: 0  Utilities: Not At Risk (06/24/2022)   AHC Utilities    Threatened with loss of utilities: No  Health Literacy: Not on file   Family History  Problem Relation Age of Onset   Hypertension Mother    Heart disease Mother    Heart attack Father    Stroke Father     Diabetes Sister    Hypertension Sister    Stroke Other    Diabetes Other    Heart disease Other    Current Outpatient Medications  Medication Sig Dispense Refill   acetaminophen  (TYLENOL ) 500 MG tablet Take 2 tablets (1,000 mg total) by mouth every 6 (six) hours as needed for mild pain or moderate pain. 60 tablet 0   amoxicillin (AMOXIL) 500 MG tablet Take 2,000 mg by mouth once.     aspirin  EC 81 MG tablet Take 81 mg by mouth daily. Swallow whole.     cetirizine (ZYRTEC) 10 MG chewable tablet Chew 10 mg by mouth as needed for allergies.     DULoxetine  (CYMBALTA ) 20 MG capsule  Take 20 mg by mouth 2 (two) times daily.      ENTRESTO  97-103 MG TAKE 1 TABLET TWICE A DAY 180 tablet 3   escitalopram  (LEXAPRO ) 20 MG tablet Take 20 mg by mouth at bedtime.     esomeprazole (NEXIUM) 40 MG capsule Take 40 mg by mouth in the morning.     fluticasone  (FLONASE ) 50 MCG/ACT nasal spray Place 2 sprays into both nostrils daily as needed for allergies (seasonal allergies).      furosemide  (LASIX ) 20 MG tablet Take 20 mg by mouth daily as needed for fluid or edema.     hydrALAZINE  (APRESOLINE ) 25 MG tablet Take 1.5 tablets (37.5 mg total) by mouth 3 (three) times daily. 220 tablet 3   hydrOXYzine  (ATARAX ) 50 MG tablet Take 50-100 mg by mouth at bedtime.     isosorbide  mononitrate (IMDUR ) 60 MG 24 hr tablet Take 1 tablet (60 mg total) by mouth daily. 90 tablet 3   meloxicam (MOBIC) 15 MG tablet Take 15 mg by mouth daily.     metoprolol  succinate (TOPROL -XL) 100 MG 24 hr tablet TAKE 1 TABLET TWICE A DAY,TAKE WITH OR IMMEDIATELY FOLLOWING A MEAL (DISCONTINUE METOPROLOL  TARTRATE) 180 tablet 3   nitroGLYCERIN  (NITROSTAT ) 0.4 MG SL tablet DISSOLVE 1 TABLET UNDER THE TONGUE EVERY 5 MINUTES AS NEEDED FOR CHEST PAIN, AS DIRECTED. 25 tablet 11   ondansetron  (ZOFRAN ) 4 MG tablet Take 4 mg by mouth every 8 (eight) hours as needed for nausea or vomiting.     oxyCODONE -acetaminophen  (PERCOCET) 10-325 MG tablet Take 1 tablet  by mouth every 6 (six) hours.     REPATHA  SURECLICK 140 MG/ML SOAJ INJECT 1 PEN UNDER THE SKIN EVERY 14 DAYS 6 mL 3   rOPINIRole  (REQUIP ) 2 MG tablet Take 2 mg by mouth 2 (two) times daily.     rosuvastatin  (CRESTOR ) 10 MG tablet TAKE 1 TABLET AT BEDTIME 90 tablet 3   spironolactone  (ALDACTONE ) 25 MG tablet Take 1 tablet (25 mg total) by mouth daily. 90 tablet 3   tiZANidine  (ZANAFLEX ) 4 MG tablet Take 4 mg by mouth every 8 (eight) hours as needed for muscle spasms.     No current facility-administered medications for this visit.   Wt Readings from Last 3 Encounters:  04/01/24 75.4 kg (166 lb 3.2 oz)  02/10/24 78.1 kg (172 lb 3.2 oz)  08/24/23 78.5 kg (173 lb)   There were no vitals taken for this visit.  Physical exam: General: NAD Neck: No JVD, no thyromegaly or thyroid  nodule.  Lungs: Clear to auscultation bilaterally with normal respiratory effort. CV: Nondisplaced PMI.  Heart regular S1/S2, no S3/S4, 2/6 SEM RUSB with clear S2.  No peripheral edema.  No carotid bruit.  Normal pedal pulses.  Abdomen: Soft, nontender, no hepatosplenomegaly, no distention.  Skin: Intact without lesions or rashes.  Neurologic: Alert and oriented x 3.  Psych: Normal affect. Extremities: No clubbing or cyanosis.  HEENT: Normal.   Assessment/Plan:  1. Chronic diastolic CHF: Echo in 1/19 showed vigorous LV systolic function EF 65-70% with intracavity gradient.  Echo in 6/21 showed EF 65-70% with moderate LVH, normal RV.  Echo in 3/23 showed EF 63%, mild-moderate MR, RV normal, IVC normal.  RHC (9/23) showed normal filling pressures. PFTs 6/24 no COPD. She is not volume overloaded on exam but has noted increased exertional dyspnea, NYHA class II.   - Unable to tolerate Jardiance  due to yeast infections.  - Continue Entresto  97/103 mg bid. BMET/BNP today. - Continue spironolactone   25 mg daily. - She does not appear to need Lasix .  - I will arrange for echo.  2. Coronary artery disease: DES to RCA in  5/21. Cardiolite in 3/22 was low risk (small partially reversible mid-anterior defect may be shifting breast artifact). LHC (9/23) showed patent RCA stent, serial 50% proximal LAD stenoses, 80% ostial stenosis of a small D1. No change from prior study in the LAD/D1, did not appear to have interventional target. She has had increased exertional dyspnea recently, has also had atypical chest pain.  - I will arrange for cardiac PET to assess for significant ischemia.   - Continue Imdur  60 mg daily and Toprol  XL 100 mg bid.  - Continue ASA 81.  - Continue Crestor  10 mg daily and Repatha .  Good lipids in 3/25.   3. CKD stage 3:  - BMET today.  4. HTN: Normal renal artery dopplers in 6/24.  SBP < 130 at home.  - She cannot tolerate amlodipine  due to swelling.  - Continue hydralazine  37.5 tid.  - Continue Entresto  97/103 bid - Continue spironolactone  25 daily.  5. OSA: Cannot use CPAP. 6. Atrial tachycardia: Short runs noted on Zio monitor 9/23.  Rare palpitations.  - Continue Toprol  XL 100 mg bid.   Follow up in 4 months with APP if cardiac PET is low risk.   I spent 32 minutes reviewing data, interviewing patient and his family, and organizing the orders/followup.  Harlene HERO [facility]  06/08/2024 "

## 2024-06-10 ENCOUNTER — Encounter (HOSPITAL_COMMUNITY)

## 2024-07-15 ENCOUNTER — Ambulatory Visit (HOSPITAL_COMMUNITY): Admitting: Cardiology

## 2024-07-22 ENCOUNTER — Ambulatory Visit (HOSPITAL_COMMUNITY)
